# Patient Record
Sex: Male | Born: 1940 | Race: White | Hispanic: No | Marital: Married | Smoking: Never smoker
Health system: Southern US, Community
[De-identification: ages and names within clinical notes are randomized; demographics above are authoritative.]

## PROBLEM LIST (undated history)

## (undated) DIAGNOSIS — E669 Obesity, unspecified: Secondary | ICD-10-CM

## (undated) DIAGNOSIS — M545 Low back pain, unspecified: Secondary | ICD-10-CM

## (undated) DIAGNOSIS — K859 Acute pancreatitis without necrosis or infection, unspecified: Secondary | ICD-10-CM

## (undated) DIAGNOSIS — G8929 Other chronic pain: Secondary | ICD-10-CM

## (undated) DIAGNOSIS — E785 Hyperlipidemia, unspecified: Secondary | ICD-10-CM

## (undated) DIAGNOSIS — M109 Gout, unspecified: Secondary | ICD-10-CM

## (undated) DIAGNOSIS — M75100 Unspecified rotator cuff tear or rupture of unspecified shoulder, not specified as traumatic: Secondary | ICD-10-CM

## (undated) DIAGNOSIS — G4733 Obstructive sleep apnea (adult) (pediatric): Secondary | ICD-10-CM

## (undated) DIAGNOSIS — G20A1 Parkinson's disease without dyskinesia, without mention of fluctuations: Secondary | ICD-10-CM

## (undated) DIAGNOSIS — I10 Essential (primary) hypertension: Secondary | ICD-10-CM

## (undated) DIAGNOSIS — E119 Type 2 diabetes mellitus without complications: Secondary | ICD-10-CM

## (undated) DIAGNOSIS — M1990 Unspecified osteoarthritis, unspecified site: Secondary | ICD-10-CM

## (undated) DIAGNOSIS — H409 Unspecified glaucoma: Secondary | ICD-10-CM

## (undated) DIAGNOSIS — G20 Parkinson's disease: Secondary | ICD-10-CM

## (undated) HISTORY — DX: Low back pain: M54.5

## (undated) HISTORY — DX: Unspecified osteoarthritis, unspecified site: M19.90

## (undated) HISTORY — DX: Acute pancreatitis without necrosis or infection, unspecified: K85.90

## (undated) HISTORY — DX: Obstructive sleep apnea (adult) (pediatric): G47.33

## (undated) HISTORY — PX: OTHER SURGICAL HISTORY: SHX169

## (undated) HISTORY — PX: TONSILLECTOMY: SUR1361

## (undated) HISTORY — DX: Hyperlipidemia, unspecified: E78.5

## (undated) HISTORY — DX: Unspecified rotator cuff tear or rupture of unspecified shoulder, not specified as traumatic: M75.100

## (undated) HISTORY — DX: Other chronic pain: G89.29

## (undated) HISTORY — PX: HERNIA REPAIR: SHX51

## (undated) HISTORY — DX: Gout, unspecified: M10.9

## (undated) HISTORY — PX: APPENDECTOMY: SHX54

## (undated) HISTORY — DX: Obesity, unspecified: E66.9

## (undated) HISTORY — DX: Unspecified glaucoma: H40.9

## (undated) HISTORY — DX: Low back pain, unspecified: M54.50

---

## 2002-02-03 ENCOUNTER — Encounter: Payer: Self-pay | Admitting: Emergency Medicine

## 2002-02-03 ENCOUNTER — Emergency Department (HOSPITAL_COMMUNITY): Admission: EM | Admit: 2002-02-03 | Discharge: 2002-02-04 | Payer: Self-pay | Admitting: Emergency Medicine

## 2002-09-16 ENCOUNTER — Encounter: Payer: Self-pay | Admitting: Family Medicine

## 2002-09-16 ENCOUNTER — Observation Stay (HOSPITAL_COMMUNITY): Admission: AD | Admit: 2002-09-16 | Discharge: 2002-09-17 | Payer: Self-pay | Admitting: Cardiovascular Disease

## 2002-09-16 ENCOUNTER — Ambulatory Visit (HOSPITAL_COMMUNITY): Admission: RE | Admit: 2002-09-16 | Discharge: 2002-09-16 | Payer: Self-pay | Admitting: Family Medicine

## 2004-02-29 ENCOUNTER — Emergency Department (HOSPITAL_COMMUNITY): Admission: EM | Admit: 2004-02-29 | Discharge: 2004-02-29 | Payer: Self-pay | Admitting: Emergency Medicine

## 2004-07-11 ENCOUNTER — Observation Stay (HOSPITAL_COMMUNITY): Admission: EM | Admit: 2004-07-11 | Discharge: 2004-07-12 | Payer: Self-pay | Admitting: Emergency Medicine

## 2004-11-12 ENCOUNTER — Emergency Department (HOSPITAL_COMMUNITY): Admission: EM | Admit: 2004-11-12 | Discharge: 2004-11-12 | Payer: Self-pay | Admitting: Emergency Medicine

## 2004-11-21 ENCOUNTER — Encounter (INDEPENDENT_AMBULATORY_CARE_PROVIDER_SITE_OTHER): Payer: Self-pay | Admitting: General Surgery

## 2004-11-21 ENCOUNTER — Observation Stay (HOSPITAL_COMMUNITY): Admission: RE | Admit: 2004-11-21 | Discharge: 2004-11-22 | Payer: Self-pay | Admitting: General Surgery

## 2004-11-22 ENCOUNTER — Observation Stay (HOSPITAL_COMMUNITY): Admission: EM | Admit: 2004-11-22 | Discharge: 2004-11-24 | Payer: Self-pay | Admitting: Emergency Medicine

## 2005-03-06 ENCOUNTER — Ambulatory Visit (HOSPITAL_COMMUNITY): Admission: RE | Admit: 2005-03-06 | Discharge: 2005-03-06 | Payer: Self-pay | Admitting: Family Medicine

## 2005-08-15 ENCOUNTER — Ambulatory Visit (HOSPITAL_COMMUNITY): Admission: RE | Admit: 2005-08-15 | Discharge: 2005-08-15 | Payer: Self-pay | Admitting: Family Medicine

## 2005-09-08 ENCOUNTER — Ambulatory Visit (HOSPITAL_COMMUNITY): Admission: RE | Admit: 2005-09-08 | Discharge: 2005-09-08 | Payer: Self-pay | Admitting: Internal Medicine

## 2005-10-05 ENCOUNTER — Ambulatory Visit (HOSPITAL_COMMUNITY): Admission: RE | Admit: 2005-10-05 | Discharge: 2005-10-05 | Payer: Self-pay | Admitting: Family Medicine

## 2006-04-28 ENCOUNTER — Encounter: Admission: RE | Admit: 2006-04-28 | Discharge: 2006-04-28 | Payer: Self-pay | Admitting: Orthopedic Surgery

## 2006-06-17 ENCOUNTER — Emergency Department (HOSPITAL_COMMUNITY): Admission: EM | Admit: 2006-06-17 | Discharge: 2006-06-17 | Payer: Self-pay | Admitting: Emergency Medicine

## 2006-10-05 ENCOUNTER — Emergency Department (HOSPITAL_COMMUNITY): Admission: EM | Admit: 2006-10-05 | Discharge: 2006-10-05 | Payer: Self-pay | Admitting: *Deleted

## 2008-03-13 ENCOUNTER — Ambulatory Visit (HOSPITAL_COMMUNITY): Admission: RE | Admit: 2008-03-13 | Discharge: 2008-03-13 | Payer: Self-pay | Admitting: Family Medicine

## 2008-05-03 ENCOUNTER — Emergency Department (HOSPITAL_COMMUNITY): Admission: EM | Admit: 2008-05-03 | Discharge: 2008-05-03 | Payer: Self-pay | Admitting: Emergency Medicine

## 2009-07-04 ENCOUNTER — Emergency Department (HOSPITAL_COMMUNITY): Admission: EM | Admit: 2009-07-04 | Discharge: 2009-07-05 | Payer: Self-pay | Admitting: Emergency Medicine

## 2009-07-20 ENCOUNTER — Encounter (HOSPITAL_COMMUNITY): Admission: RE | Admit: 2009-07-20 | Discharge: 2009-08-19 | Payer: Self-pay | Admitting: Neurology

## 2009-10-12 ENCOUNTER — Ambulatory Visit (HOSPITAL_COMMUNITY)
Admission: RE | Admit: 2009-10-12 | Discharge: 2009-10-12 | Payer: Self-pay | Source: Home / Self Care | Admitting: Family Medicine

## 2009-10-14 ENCOUNTER — Emergency Department (HOSPITAL_COMMUNITY): Admission: EM | Admit: 2009-10-14 | Discharge: 2009-10-15 | Payer: Self-pay | Admitting: Emergency Medicine

## 2010-05-05 LAB — DIFFERENTIAL
Basophils Relative: 0 % (ref 0–1)
Eosinophils Relative: 2 % (ref 0–5)
Lymphocytes Relative: 24 % (ref 12–46)
Lymphs Abs: 1.7 10*3/uL (ref 0.7–4.0)
Monocytes Absolute: 0.7 10*3/uL (ref 0.1–1.0)
Monocytes Relative: 10 % (ref 3–12)

## 2010-05-05 LAB — URINALYSIS, ROUTINE W REFLEX MICROSCOPIC
Bilirubin Urine: NEGATIVE
Glucose, UA: NEGATIVE mg/dL
Nitrite: NEGATIVE
Specific Gravity, Urine: 1.025 (ref 1.005–1.030)
Urobilinogen, UA: 0.2 mg/dL (ref 0.0–1.0)
pH: 6.5 (ref 5.0–8.0)

## 2010-05-05 LAB — COMPREHENSIVE METABOLIC PANEL
AST: 22 U/L (ref 0–37)
CO2: 26 mEq/L (ref 19–32)
Creatinine, Ser: 0.92 mg/dL (ref 0.4–1.5)
GFR calc Af Amer: >60 mL/min (ref >60)
GFR calc non Af Amer: >60 mL/min (ref >60)

## 2010-05-05 LAB — CBC
MCV: 86.8 fL (ref 78.0–100.0)
Platelets: 215 10*3/uL (ref 150–400)
RDW: 12.6 % (ref 11.5–15.5)

## 2010-05-09 LAB — CBC
Hemoglobin: 14.8 g/dL (ref 13.0–17.0)
MCHC: 35.5 g/dL (ref 30.0–36.0)
WBC: 7.2 10*3/uL (ref 4.0–10.5)

## 2010-05-09 LAB — HEPATIC FUNCTION PANEL
Alkaline Phosphatase: 69 U/L (ref 39–117)
Indirect Bilirubin: 0.4 mg/dL (ref 0.3–0.9)
Total Protein: 7 g/dL (ref 6.0–8.3)

## 2010-05-09 LAB — BRAIN NATRIURETIC PEPTIDE: Pro B Natriuretic peptide (BNP): <30 pg/mL (ref 0.0–100.0)

## 2010-05-09 LAB — POCT CARDIAC MARKERS
CKMB, poc: 3.2 ng/mL (ref 1.0–8.0)
Troponin i, poc: <0.05 ng/mL (ref 0.00–0.09)

## 2010-05-09 LAB — BASIC METABOLIC PANEL
BUN: 16 mg/dL (ref 6–23)
Calcium: 9.3 mg/dL (ref 8.4–10.5)
Chloride: 105 mEq/L (ref 96–112)
Creatinine, Ser: 0.87 mg/dL (ref 0.4–1.5)
GFR calc non Af Amer: >60 mL/min (ref >60)
Glucose, Bld: 142 mg/dL — ABNORMAL HIGH (ref 70–99)

## 2010-05-09 LAB — DIFFERENTIAL
Basophils Relative: 0 % (ref 0–1)
Eosinophils Absolute: 0.2 10*3/uL (ref 0.0–0.7)
Lymphocytes Relative: 33 % (ref 12–46)
Lymphs Abs: 2.3 10*3/uL (ref 0.7–4.0)
Monocytes Absolute: 0.8 10*3/uL (ref 0.1–1.0)
Neutro Abs: 3.8 10*3/uL (ref 1.7–7.7)
Neutrophils Relative %: 53 % (ref 43–77)

## 2010-05-09 LAB — LIPASE, BLOOD: Lipase: 36 U/L (ref 11–59)

## 2010-06-02 LAB — CBC
MCV: 86.7 fL (ref 78.0–100.0)
Platelets: 223 10*3/uL (ref 150–400)
RBC: 5.03 MIL/uL (ref 4.22–5.81)
WBC: 5.8 10*3/uL (ref 4.0–10.5)

## 2010-06-02 LAB — DIFFERENTIAL
Lymphocytes Relative: 29 % (ref 12–46)
Lymphs Abs: 1.7 10*3/uL (ref 0.7–4.0)
Monocytes Relative: 11 % (ref 3–12)
Neutro Abs: 3.3 10*3/uL (ref 1.7–7.7)
Neutrophils Relative %: 57 % (ref 43–77)

## 2010-06-02 LAB — POCT CARDIAC MARKERS
CKMB, poc: 3.4 ng/mL (ref 1.0–8.0)
Troponin i, poc: <0.05 ng/mL (ref 0.00–0.09)

## 2010-06-02 LAB — BASIC METABOLIC PANEL
BUN: 18 mg/dL (ref 6–23)
Calcium: 9 mg/dL (ref 8.4–10.5)
Chloride: 102 mEq/L (ref 96–112)
Creatinine, Ser: 0.92 mg/dL (ref 0.4–1.5)
GFR calc Af Amer: >60 mL/min (ref >60)
GFR calc non Af Amer: >60 mL/min (ref >60)

## 2010-07-08 NOTE — Op Note (Signed)
NAMEFERNIE, Elijah [name]               ACCOUNT NO.:  [id number]   MEDICAL RECORD NO.:  [id number]          PATIENT TYPE:  AMB   LOCATION:  DAY                           FACILITY:  [facility]   PHYSICIAN:  Dalia Heading, M.D.  DATE OF BIRTH:  1940-06-26   DATE OF PROCEDURE:  11/21/2004  DATE OF DISCHARGE:                                 OPERATIVE REPORT   PREOPERATIVE DIAGNOSIS:  Recurrent incisional hernia.   POSTOPERATIVE DIAGNOSIS:  Recurrent incisional hernia.   PROCEDURE:  Recurrent incisional herniorrhaphy with allograft mesh.   SURGEON:  Dr. Franky Macho.   ANESTHESIA:  General endotracheal.   INDICATIONS:  The patient is a 70 year old white male who presents with a  recurrent incisional hernia. Previous incisional herniorrhaphy with  polypropylene mesh patch in 2000. He now presents with recurrent incisional  hernia. Risks and benefits of the procedure including bleeding, infection,  and recurrence of the hernia were fully explained to the patient, who gave  informed consent.   PROCEDURE NOTE:  The patient was placed in supine position. After induction  of general endotracheal anesthesia, the abdomen was prepped and draped using  the usual sterile technique with Betadine. Surgical site confirmation was  performed.   A midline excision was made from the subxiphoid process to the umbilicus.  This was taken through the previous surgical scar. The dissection was taken  down to the hernia. The patient was noted to have multiple subcutaneous  hernias, the largest of which was along the right side extending to the  right flank. The bowel was reduced into the abdominal cavity in an orderly  fashion. The hernia sac was entered into and was excised to the fascial  edges. The fascial edges were identified. It appeared that the mesh had  freed away from the right side of the abdominal wall. Once all the adhesions  were freed away from the hernia sac and the hernia sac was excised, a 4 x  12  cm piece of allograft was secured to the fascial edges using #1 Prolene  running suture. Number one Prolene stay sutures were also placed throughout  the repair. A #10 flat Jackson-Pratt drain was placed in the subcutaneous  tissue where the largest hernia sac was found. The subcutaneous layer was  reapproximated using 2-0 Vicryl interrupted sutures. The skin was closed  using staples. Betadine ointment and dry sterile dressings were applied.   All tape and needle counts were correct at the end of the procedure. The  patient was extubated in the operating room and went back to recovery room  awake in stable condition.   COMPLICATIONS:  None.   SPECIMEN:  Hernia sacs.   BLOOD LOSS:  Less than 100 cc.      Dalia Heading, M.D.  Electronically Signed     MAJ/MEDQ  D:  11/21/2004  T:  11/21/2004  Job:  [id number]   cc:   Angus G. Renard Matter, MD  Fax: [phone]

## 2010-07-08 NOTE — Group Therapy Note (Signed)
   Elijah [name], Elijah [name]                         ACCOUNT NO.:  [id number]   MEDICAL RECORD NO.:  [id number]                   PATIENT TYPE:  INP   LOCATION:  IC04                                 FACILITY:  [facility]   PHYSICIAN:  Angus G. Renard Matter, M.D.              DATE OF BIRTH:  18-Mar-1940   DATE OF PROCEDURE:  09/17/2002  DATE OF DISCHARGE:                                   PROGRESS NOTE   SUBJECTIVE:  This patient was admitted to the hospital with chest pain.  He  was placed on nitroglycerin drip.  He has remained stable with the exception  of the pulse rate remains in the 40s, has no further chest pain.   OBJECTIVE:  Vital signs:  Blood pressure 115/59, respirations 29, pulse 62,  temperature 98.4.  Lungs:  Clear to P&A.  Heart:  Regular rhythm.  Abdomen:  No palpable organs or masses.  His cardiac enzymes showed CPK 382, CPK-MB  6.3, relative index 1.6, troponin 0.01.   ASSESSMENT:  The patient was admitted with left anterior chest pain,  dyspnea.  He does have known metabolic syndrome, dyslipidemia.   PLAN:  Continue current regimen.                                               Angus G. Renard Matter, M.D.    AGM/MEDQ  D:  09/17/2002  T:  09/17/2002  Job:  [id number]

## 2010-07-08 NOTE — Consult Note (Signed)
Elijah [name], Elijah [name]                         ACCOUNT NO.:  [id number]   MEDICAL RECORD NO.:  [id number]                   PATIENT TYPE:   LOCATION:  ICU 4                                FACILITY:  [facility]   PHYSICIAN:  Richard A. Alanda Amass, M.D.          DATE OF BIRTH:  [birth year]   DATE OF CONSULTATION:  DATE OF DISCHARGE:                                   CONSULTATION   The patient is a 70 year old white, married, father of four with four  grandchildren.  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  He has a history of hyperlipidemia,  exogenous obesity, metabolic syndrome and statin intolerance.  He was  referred for evaluation of chest discomfort and CK elevation with negative  MBs and troponin.  Elijah [name] has coronary risk factors of exogenous obesity,  AODM diet controlled, hyperlipidemia and a family history of coronary  disease.   FAMILY HISTORY:  His mother had heart problems and multiple prior  interventions and is a patient of mine.  She is living at age 55.  Father  died at age 54.  He had a prior carotid endarterectomy.  He has no siblings.  He has four children, ages 73-36, who are alive and well.  His daughter is  pregnant now and he is expecting his five grandchild.   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  He also did some moving of furniture,  although, he avoided heavy lifting last week when he helped move his  daughter.   REASON FOR CONSULTATION:  This morning he began having somewhat vague left  upper extremity and left upper chest discomfort.  There was no radiation to  the mid-back or to the substernal region.  There was some mild  lightheadedness, a feeling of weakness and shortness of breath but no  diaphoresis, nausea or vomiting.  He saw Dr. Renard Matter.  An EKG showed no  acute changes.  It showed a sinus rhythm with minor nonspecific ST  changes  compatible with early repolarization.   LABORATORY DATA:  CPK of 717, an MB of 10.8 with an index of 1.5 and a  troponin of 0.04.  The patient has a history of hyperlipidemia in the past  and was tried on Crestor over a year ago.  He developed some vague cramps,  but moreover, he had persistent elevated CK with normal LFTs and the Crestor  was discontinued.  His last CK, February 2004, was 290 but he had been off  Crestor for at least six months at that time.   PAST MEDICAL HISTORY:  There is no definite history of hypertension and he  takes no regular medications.   PAST SURGICAL HISTORY:  He has had remote exploratory laparotomy and  cholecystectomy by Dr. Lovell Sheehan several years ago and has a midline incision  and asymptomatic ventral hernia.   ALLERGIES:  He is allergic to PENICILLIN with no other allergies.   REVIEW OF SYSTEMS:  There has been no GI bleeding; although, there is a  prior history of GERD and possibly ulcer disease.  He has not had  Claudication and there is no known thyroid disease.   PHYSICAL EXAMINATION:  GENERAL:  He has moderate obesity, particularly  truncal.  VITAL SIGNS:  Weight 248, height 5 foot 10 inches.  Blood pressure 160-  170/85-90 bilaterally.  NECK:  There are no carotid bruits.  HEENT:  PERRLA.  EOMs are intact.  Thyroid is not enlarged.  CHEST:  Shows mildly diminished breath sounds but is clear to P&A (he is a  nonsmoker).  There are no wheezes, rubs or rhonchi.  PMI is on the left sixth ICS just in the MCL.  Felt there was a localized  heave.  There is an S4 that is audible but no palpable and there is a short  systolic murmur at the left sternal border radiating to the apex 2/6.  There  is some radiation to the base but not to the carotids.  The carotid upstroke  is good.  This sounds like aortic sclerosis or possibly mild aortic stenosis  rather than functional.  There is no S3, JVD or HJR reflux.  DP and PT  intact.  Femorals  intact without bruits.  ABDOMEN:  Liver and spleen not palpable.  Midline ventral hernia.  No  abdominal masses, tenderness or bruits.  No pathologic reflexes.   EKG shows a sinus rhythm with minor nonspecific ST changes compatible with  early repolarization, minor IVCD.  No significant changes from prior EKG  done earlier today.   ASSESSMENT AND PLAN:  The patient's history is compatible with ischemia and  he does have risk factors.  I believe the CPKs are probably of noncardiac  origin and may be musculoskeletal related to heavy exertion both at work  over the last several days and last week.  He did have some elevated CPKs in  the past that were attributed to Crestor; but, he may have chronic CPK  elevation that are unrelated to that.  I would not expect to see CPKs  elevated a year later related to statin therapy.   In any event, with his risk factors and current history and laboratory  values, I think we should get serial tracings, start him on medical therapy.  If his pain is resolved and he has no enzymes markers to suggest ongoing  ischemia, I think he could have outpatient Cardiolite; if not, I would  recommend cardiac catheterization evaluation.  The patient and his wife are  agreeable with this and have discussed this with Dr. Renard Matter.  He is  admitted to Dr. Renard Matter' service at _________  and we will see him in  followup to help make further decisions.  The patient is going to telemetry  at [facility].                                                Richard A. Alanda Amass, M.D.    RAW/MEDQ  D:  09/16/2002  T:  09/16/2002  Job:  [id number]   cc:   Angus G. Renard Matter, M.D.  [address]  [place]  [place] [address]  Fax: [phone]

## 2010-07-08 NOTE — Discharge Summary (Signed)
NAMEMACY, LINGENFELTER               ACCOUNT NO.:  [id number]   MEDICAL RECORD NO.:  [id number]          PATIENT TYPE:  INP   LOCATION:  A303                          FACILITY:  [facility]   PHYSICIAN:  Dalia Heading, M.D.  DATE OF BIRTH:  [birth year]   DATE OF ADMISSION:  11/22/2004  DATE OF DISCHARGE:  10/05/2006LH                                 DISCHARGE SUMMARY   HOSPITAL COURSE:  The patient is a 70 year old, white male who underwent a  recurrent incisional herniorrhaphy with Allograft mesh on November 21, 2004.  He tolerated the procedure well and was discharged home on November 22, 2004.  He returned to the emergency room complaining of a choking sensation and  shortness of breath.  His oxygen saturation was 99%.  It appears that the  patient has a previous history of sinusitis and felt he could not clear his  throat due to drainage and incisional pain.  Anesthesia came by and saw the  patient and felt that there was no complication from anesthesia.  He was  started on Claritin D.  He improved during his stay.  He is being discharged  home on November 24, 2004, in good and improving condition.   FOLLOW UP:  The patient is to follow up with Dr. Franky Macho on November 29, 2004.  He is to drain and record his bulb suction twice a day.   DISCHARGE MEDICATIONS:  1.  Claritin D 12-hour 1 tablet p.o. b.i.d.  2.  He is to resume al his other medications as previously prescribed.   DISCHARGE DIAGNOSES:  1.  Shortness of breath, sinusitis.  2.  Status post recurrent incisional herniorrhaphy with Allograft mesh.  3.  Non-insulin-dependent diabetes mellitus.  4.  Hypertension.   PROCEDURES:  None.      Dalia Heading, M.D.  Electronically Signed     MAJ/MEDQ  D:  11/24/2004  T:  11/24/2004  Job:  [id number]

## 2010-07-08 NOTE — Cardiovascular Report (Signed)
NAMEKENDRELL, LOTTMAN NO.:  [id number]   MEDICAL RECORD NO.:  [id number]          PATIENT TYPE:  INP   LOCATION:  3707                         FACILITY:  [facility]   PHYSICIAN:  Nicki Guadalajara, M.D.     DATE OF BIRTH:  August 01, 1940   DATE OF PROCEDURE:  07/12/2004  DATE OF DISCHARGE:                              CARDIAC CATHETERIZATION   INDICATIONS:  Mr. Izaya Netherton is a 70 year old white male patient of Drs.  McIn[place] and [place]. He had previously undergone cardiac catheterization  several years ago which revealed mild coronary artery disease for which he  was treated medically. He had been doing fairly well, but he was admitted to  [facility] yesterday with episodes of chest pain several times  during the past week radiating to his throat. Troponins were negative x 2.  CPK was increased at 379 with an MB of 11.8. Definitive diagnostic cardiac  catheterization was recommended.   PROCEDURE:  After premedication with Valium 5 mg intravenously, the patient  was prepped and draped in the usual fashion. His right femoral artery was  punctured anteriorly and a 5-French arterial sheath was inserted. Diagnostic  catheterization was done with a 5-French Judkins for left and right coronary  catheters. Two hundred micrograms of intracoronary nitroglycerin  administered to the right femoral artery due to potential coronary spasm  proximally. A 5-French pigtail catheter was then inserted for biplane cine  left ventriculography. With the patient's hypertensive history, distal  aortography was also performed to make certain there was no significant  renal artery stenosis. Hemostasis was obtained by direct manual pressure.  The patient tolerated the procedure well.   Hemodynamic data:  Central aortic pressure was 120/72.  Left ventricular  pressure was 120/70.   Angiographic data:  Left main coronary artery was a large caliber vessel  that had 20% eccentric inferior  smooth narrowing. The left main trifurcated  into an LAD, the ramus intermediate vessel, as well as a tortuous proximal  left circumflex vessel.   The LAD was angiographically normal and gave rise to two prominent proximal  diagonal vessels and several septal perforating arteries.   The optional diagonal/ramus intermediate vessel had mild 20% narrowing  __________  the very proximal circumflex had smooth 20% narrowing on a sharp  bend in the vessel and gave rise to one marginal vessel.   The right coronary was a very large dominant vessel that had a second  shepherd's crook takeoff. There was a mild 30% narrowing in the most  proximal segment at the distal aspect of the catheter.  Two hundred  micrograms of intracoronary nitroglycerin was administered with some  improvement suggesting component of catheter-induced spasm. There was smooth  20-30% narrowing within the shepherd's crook bend. There was mild luminal  irregularity of the large right coronary artery without significant  obstruction. There was mild smooth 20% narrowing in the proximal portion of  the large PDA system. The right coronary artery entered a large PLA system  which extended to the LV apex.   Biplane cine left ventriculography revealed normal LV contractility without  focal segmental wall motion abnormalities. There was no mitral  regurgitation.   Distal aortography did not reveal any significant aortoiliac disease.  The  iliac systems bilaterally were fairly tortuous.   IMPRESSION:  1.  Normal left ventricular function.  2.  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  Very mild, not significant obstructive coronary artery disease with      smooth narrowing of 20% in the proximal portion of the left main       coronary artery, 20% narrowing in the proximal portion of a ramus      intermediate branch; 20% narrowing in the proximal portion of the left      circumflex coronary artery, and 20-30% proximal narrowing in the right      coronary artery in the region of the shepherd's crook with 30% narrowing      more proximally which may have had a component of a catheter-induced      spasm.   RECOMMENDATIONS:  Medical therapy.      TK/MEDQ  D:  07/12/2004  T:  07/12/2004  Job:  [id number]   cc:   Gerlene Burdock A. Alanda Amass, M.D.  [phone] [address]  [place]  [place] [address]  Fax: [phone]   Angus G. Renard Matter, MD  [address]  [place]  [place] [address]  Fax: [phone]

## 2010-07-08 NOTE — H&P (Signed)
NAMEDRYSTAN, READER               ACCOUNT NO.:  [id number]   MEDICAL RECORD NO.:  [id number]           PATIENT TYPE:  AMB   LOCATION:                                FACILITY:  [facility]   PHYSICIAN:  Dalia Heading, M.D.  DATE OF BIRTH:  1940-03-06   DATE OF ADMISSION:  DATE OF DISCHARGE:  LH                                HISTORY & PHYSICAL   CHIEF COMPLAINT:  Recurrent incisional hernia.   HISTORY OF PRESENT ILLNESS:  The patient is a 70 year old white male who is  referred for evaluation and treatment of a recurrent incisional hernia. It  has been present for some time, but recently has increased in size and is  causing him discomfort. He last had an incisional herniorrhaphy with mesh in  2000.  No nausea or vomiting has been noted.  He was constipated recently,  but this has since resolved.   PAST MEDICAL HISTORY:  Hypertension and reflux disease.   PAST SURGICAL HISTORY:  As noted above, exploratory laparotomy secondary to  a perforated appendix in 1998.   CURRENT MEDICATIONS:  1.  Toprol 12.5 mg p.o. daily.  2.  Advicor 500 -- 20 mg p.o. daily.  3.  Diovan 80/12.5 mg p.o. daily.  4.  Protonix 40 mg p.o. daily.  5.  Baby aspirin 1 tablet p.o. daily.   ALLERGIES:  No known drug allergies.   REVIEW OF SYSTEMS:  The patient denies drinking or smoking.  Denies any  recent chest pain, MI, cerebrovascular accident, diabetes mellitus, or  bleeding disorders.   PHYSICAL EXAMINATION:  GENERAL:  On physical examination, the patient is an  obese white male in no acute distress.  LUNGS:  Clear to auscultation with equal breath sounds bilaterally.  HEART:  Heart examination reveals a regular rate and rhythm without S3, S4,  or murmurs.  ABDOMEN:  The abdomen is soft, nontender, nondistended.  No  hepatosplenomegaly or masses noted.  A large mid incisional hernia is noted  extended to the right lateral side.   IMPRESSION:  Recurrent incisional hernia.   PLAN:  The patient is  scheduled for a recurrent incisional herniorrhaphy  with Alloderm on November 21, 2004.  The risks and benefits of the procedure  including bleeding, infection, and the possibility of recurrence of the  hernia were fully explained to the patient, who gave informed consent.      Dalia Heading, M.D.  Electronically Signed     MAJ/MEDQ  D:  11/15/2004  T:  11/15/2004  Job:  [id number]   cc:   Angus G. Renard Matter, MD  Fax: [phone]

## 2010-07-12 NOTE — H&P (Signed)
Elijah [name], Elijah [name]                         ACCOUNT NO.:  [id number]   MEDICAL RECORD NO.:  [id number]                   PATIENT TYPE:   LOCATION:  ICU 4                                FACILITY:  [facility]   PHYSICIAN:  Angus G. Renard Matter, M.D.              DATE OF BIRTH:  [birth year]   DATE OF ADMISSION:  09/16/2002  DATE OF DISCHARGE:                                HISTORY & PHYSICAL   HISTORY OF PRESENT ILLNESS:  This 70 year old white male was seen earlier in  the day with the chief complaint being left upper chest pain which occurred  intermittently through the night and associated pain in the left arm.  The  patient had had some nasal congestion and postnasal drainage.  In the course  of the workup, he had a chest x-ray which was negative.  Cardiac enzymes  were abnormal showing a CPK of 717, CPK-MB 10.8, relative index 1.5, and  troponin 0.04.  Electrocardiogram with no acute changes.  The patient was  seen subsequently by Richard A. Alanda Amass, M.D., of cardiology.  A repeat  EKG was done which showed no acute change, but it was felt that he should be  placed in an acute care facility for further evaluation of cardiac enzymes  and further monitoring to rule out ischemic heart disease.   FAMILY HISTORY:  Positive for diabetes and hypertensive cardiovascular  disease.   SOCIAL HISTORY:  The patient does not smoke or drink alcohol.   PAST MEDICAL HISTORY:  The patient had an admission to [facility] in 1988  with chest pain to rule out ischemic heart disease.  No disease found.  The  patient also has a history of elevated CK thought secondary to statins.  He  does have a history of dyslipidemia and metabolic syndrome.   PAST SURGICAL HISTORY:  The patient has had a history of incisional hernia  repair by Dr. Lovell Sheehan in December of 2000.  He has also had a prior  appendectomy with perforated appendix in 1998.   ALLERGIES:  The patient has an allergy to PENICILLIN.   REVIEW OF  SYSTEMS:  HEENT:  Negative.  CARDIOPULMONARY:  The patient has had  anterior chest pain.  No cough.  Minimal dyspnea.  GASTROINTESTINAL:  No  bowel irregularity or bleeding.  GENITOURINARY:  No dysuria or hematuria.   PHYSICAL EXAMINATION:  GENERAL APPEARANCE:  An alert white male.  VITAL SIGNS:  Blood pressure 140/80, pulse 60, respirations 18, temperature  98 degrees.  HEENT:  Eyes:  PERRLA.  TMs negative.  Oropharynx benign.  NECK:  Supple.  No JVD or thyroid abnormalities.  HEART:  Regular rhythm.  No murmurs.  No cardiomegaly.  LUNGS:  Clear to P&A.  ABDOMEN:  No palpable masses or organomegaly.  EXTREMITIES:  Free of edema.  NEUROLOGIC:  No focal deficit.    DIAGNOSES:  1. Left chest pain of undetermined etiology, rule out ischemic  heart     disease.  2. History of dyslipidemia.  3. Metabolic syndrome.                                               Angus G. Renard Matter, M.D.    AGM/MEDQ  D:  09/16/2002  T:  09/16/2002  Job:  [id number]

## 2010-07-12 NOTE — Discharge Summary (Signed)
NAMETANNAR, BROKER               ACCOUNT NO.:  [id number]   MEDICAL RECORD NO.:  [id number]          PATIENT TYPE:  INP   LOCATION:  3707                         FACILITY:  [facility]   PHYSICIAN:  Richard A. Alanda Amass, M.D.DATE OF BIRTH:  27-Feb-1940   DATE OF ADMISSION:  07/11/2004  DATE OF DISCHARGE:  07/12/2004                                 DISCHARGE SUMMARY   DISCHARGE DIAGNOSES:  1.  Chest pain with positive CK-MB but negative troponins, negative      myocardial infarction.  2.  Nonobstructive coronary artery disease.  3.  Coronary spasm or the right coronary artery.  4.  Hypertension.  5.  Right shoulder pain.  6.  Dyslipidemia.  7.  Diet-controlled diabetes mellitus.  8.  Glaucoma.   DISCHARGE CONDITION:  Improved.   PROCEDURES:  On Jul 12, 2004, left heart catheterization by Nicki Guadalajara,  M.D.   DISCHARGE MEDICATIONS:  1.  Diovan/HCT 160/25 mg daily.  2.  Advicor 500/20 mg one daily.  3.  Toprol XL 25 mg daily.  4.  Protonix 40 mg daily.   DISCHARGE INSTRUCTIONS:  1.  A low-fat, low-salt, diabetic diet.  2.  Increase activity slowly.  3.  No strenuous activity for three days, no lifting over 10 pounds.  4.  Wash catheter site with soap and water.  Call if any bleeding, swelling      or drainage.   HISTORY OF PRESENT ILLNESS:  A 70 year old white married male patient of Dr.  Alanda Amass with minimal coronary disease, came to the emergency room at [facility]  with complaints of chest pain.  He has had pressure in his chest twice in  the last week.  His chest pressure radiates up into his throat.  He has been  slightly short of breath but no diaphoresis, no nausea or vomiting.  He has  also had some right shoulder pain that is sharp.  Today the pain and  pressure in his chest was worse and he did not think it would subside.  It  lasted about an hour.  He came to the emergency room.   PAST MEDICAL HISTORY:  1.  Hypertension.  2.  Dyslipidemia with low HDL.  3.   Gastroesophageal reflux disease.  4.  Appendectomy.  5.  Cardiac catheterization in August 2004 with minimal coronary disease in      diagonal 2.  6.  Diet-controlled diabetes.   OUTPATIENT MEDICATIONS:  Same as discharge medications.   ALLERGIES:  PENICILLIN, NIASPAN he does not tolerate.   Family history, social history, review of systems:  See H&P.   PHYSICAL EXAMINATION AT DISCHARGE:  VITAL SIGNS:  Blood pressure 128/77,  pulse of 49, respirations 20, oxygen saturation 100% on room air.  CARDIAC:  Regular rate and rhythm.  CHEST:  Lungs are clear without rales.  ABDOMEN:  Soft, nontender.  Positive bowel sounds.  EXTREMITIES:  Lower extremities without edema, and 2+ pedal pulses.   LABORATORY DATA:  Sodium 139, potassium 3.9, chloride 105, CO2 24, glucose  162, BUN 19, creatinine 1.2, calcium 9.4, total protein 6.9, albumin 4, AST  29, ALT 29, alkaline phosphatase 66, total bilirubin 0.9.  Magnesium 2.1.  Troponin I was less than 0.01.  Hemoglobin 14.9, hematocrit 43, WBC 7 and  platelets 258.  TSH 1.743.  Glycohemoglobin 6.5.  CK-MB 313, MB 8.7,  relative index was minimally elevated at 2.8.  Follow-up CK 308, MB 8.6,  relative index 2.8.  The initial CK was 379, MB 11.8 and relative index 3.1.  All the troponins were less than 0.01.  Cholesterol panel was still pending  at discharge.  Chest x-ray on May 22:  Patchy left basilar opacity,  radiographically most compatible with atelectasis or scarring, no definite  acute findings.  EKG on admission:  Sinus rhythm, normal EKG.  Follow-up on  the May 23 was bradycardia, a rate of 49 was the only abnormality.   HOSPITAL COURSE:  Mr. Donath was admitted by Dr. Allyson Sabal on call for Dr.  Alanda Amass secondary to chest pain.  He was placed on IV heparin and planned  for cardiac catheterization the next day on May 23.  He was stable.  CK-MBs  were up, relative index was minimally elevated, but his troponins were  negative.  He underwent  cardiac  catheterization that revealed nonobstructive coronary artery disease with  20% stenosis in the vessels, perhaps some spasm in the RCA.  Dr. Tresa Endo felt  he was stable and discharged him on the afternoon of Jul 12, 2004.  The  patient will follow up with Dr. Alanda Amass.       LRI/MEDQ  D:  09/05/2004  T:  09/06/2004  Job:  [id number]   cc:   Angus G. Renard Matter, MD  [address]  [place]  [place] [address]  Fax: [phone]   Nicki Guadalajara, M.D.  [phone] [address], [address]  [place], [place] [address]  Fax: [phone]

## 2010-12-02 LAB — CBC
Hemoglobin: 14.8
RBC: 5.12
RDW: 12.5
WBC: 6.9

## 2010-12-02 LAB — DIFFERENTIAL
Basophils Relative: 1
Eosinophils Absolute: 0.2
Monocytes Absolute: 0.7
Monocytes Relative: 11
Neutrophils Relative %: 59

## 2010-12-02 LAB — COMPREHENSIVE METABOLIC PANEL
ALT: 24
Alkaline Phosphatase: 69
Glucose, Bld: 117 — ABNORMAL HIGH
Potassium: 4
Sodium: 139
Total Protein: 7.1

## 2012-07-01 ENCOUNTER — Encounter (HOSPITAL_COMMUNITY): Payer: Self-pay | Admitting: *Deleted

## 2012-07-01 ENCOUNTER — Emergency Department (HOSPITAL_COMMUNITY)
Admission: EM | Admit: 2012-07-01 | Discharge: 2012-07-02 | Disposition: A | Discharge status: Home / Self Care | Payer: Medicare Other | Attending: Emergency Medicine | Admitting: Emergency Medicine

## 2012-07-01 DIAGNOSIS — I10 Essential (primary) hypertension: Secondary | ICD-10-CM | POA: Insufficient documentation

## 2012-07-01 DIAGNOSIS — G20A1 Parkinson's disease without dyskinesia, without mention of fluctuations: Secondary | ICD-10-CM | POA: Insufficient documentation

## 2012-07-01 DIAGNOSIS — E119 Type 2 diabetes mellitus without complications: Secondary | ICD-10-CM | POA: Insufficient documentation

## 2012-07-01 DIAGNOSIS — R109 Unspecified abdominal pain: Secondary | ICD-10-CM | POA: Insufficient documentation

## 2012-07-01 DIAGNOSIS — K4090 Unilateral inguinal hernia, without obstruction or gangrene, not specified as recurrent: Secondary | ICD-10-CM

## 2012-07-01 DIAGNOSIS — M545 Low back pain, unspecified: Secondary | ICD-10-CM | POA: Insufficient documentation

## 2012-07-01 DIAGNOSIS — G20 Parkinson's disease: Secondary | ICD-10-CM | POA: Insufficient documentation

## 2012-07-01 DIAGNOSIS — M549 Dorsalgia, unspecified: Secondary | ICD-10-CM

## 2012-07-01 HISTORY — DX: Parkinson's disease without dyskinesia, without mention of fluctuations: G20.A1

## 2012-07-01 HISTORY — DX: Essential (primary) hypertension: I10

## 2012-07-01 HISTORY — DX: Parkinson's disease: G20

## 2012-07-01 HISTORY — DX: Type 2 diabetes mellitus without complications: E11.9

## 2012-07-01 NOTE — ED Notes (Signed)
C/o back pain, dry hacking cough and lower abdominal soreness

## 2012-07-02 ENCOUNTER — Emergency Department (HOSPITAL_COMMUNITY): Payer: Medicare Other

## 2012-07-02 MED ORDER — LOPERAMIDE HCL 2 MG PO CAPS
ORAL_CAPSULE | ORAL | Status: AC
  Filled 2012-07-02: qty 1

## 2012-07-02 MED ORDER — DIAZEPAM 5 MG PO TABS
5.0000 mg | ORAL_TABLET | Freq: Once | ORAL | Status: AC
  Administered 2012-07-02: 5 mg via ORAL
  Filled 2012-07-02: qty 1

## 2012-07-02 MED ORDER — CYCLOBENZAPRINE HCL 10 MG PO TABS: 10.0000 mg | ORAL_TABLET | Freq: Two times a day (BID) | ORAL | Status: DC | PRN

## 2012-07-02 MED ORDER — HYDROCODONE-ACETAMINOPHEN 5-325 MG PO TABS: 1.0000 | ORAL_TABLET | Freq: Four times a day (QID) | ORAL | Status: DC | PRN

## 2012-07-02 NOTE — ED Provider Notes (Signed)
History     CSN: [id number]  Arrival date & time 07/01/12  2139   First MD Initiated Contact with Patient 07/02/12 0005      Chief Complaint  Patient presents with  . Back Pain    (Consider location/radiation/quality/duration/timing/severity/associated sxs/prior treatment) HPI History provided by patient. Spent about 9 hours doing lawn work 2 days ago and woke up the following day with right-sided lower back pain radiates to right inguinal region. He's had this inguinal pain for some time with known history of hernia. No new bulging. No associated weakness or numbness. Pain feels like a "muscle spasm", is worse with any movement, twisting or bending. Patient has history of arthritis and written back pains but denies pain like this before. No hematuria. No fevers. No known trauma otherwise. Symptoms moderate to severe.  Past Medical History  Diagnosis Date  . Parkinson disease   . Diabetes mellitus without complication   . Hypertension     Past Surgical History  Procedure Laterality Date  . Appendectomy    . Hernia repair      No family history on file.  History  Substance Use Topics  . Smoking status: Never Smoker   . Smokeless tobacco: Not on file  . Alcohol Use: No      Review of Systems  Constitutional: Negative for fever and chills.  HENT: Negative for neck pain and neck stiffness.   Eyes: Negative for pain.  Respiratory: Negative for shortness of breath.   Cardiovascular: Negative for chest pain.  Gastrointestinal: Negative for constipation, blood in stool and abdominal distention.  Genitourinary: Negative for dysuria.  Musculoskeletal: Positive for back pain.  Skin: Negative for rash.  Neurological: Negative for headaches.  All other systems reviewed and are negative.    Allergies  Aspirin and Penicillins  Home Medications   Current Outpatient Rx  Name  Route  Sig  Dispense  Refill  . cyclobenzaprine (FLEXERIL) 10 MG tablet   Oral   Take 1 tablet  (10 mg total) by mouth 2 (two) times daily as needed for muscle spasms.   20 tablet   0   . HYDROcodone-acetaminophen (NORCO/VICODIN) 5-325 MG per tablet   Oral   Take 1 tablet by mouth every 6 (six) hours as needed for pain.   14 tablet   0     BP 155/78  Pulse 64  Temp(Src) 97.5 F (36.4 C) (Oral)  Resp 16  Ht 5\' 10"  (1.778 m)  Wt 230 lb (104.327 kg)  BMI 33 kg/m2  SpO2 100%  Physical Exam  Constitutional: He is oriented to person, place, and time. He appears well-developed and well-nourished.  HENT:  Head: Normocephalic and atraumatic.  Eyes: EOM are normal. Pupils are equal, round, and reactive to light.  Neck: Neck supple.  Cardiovascular: Normal rate, regular rhythm and intact distal pulses.   Pulmonary/Chest: Effort normal and breath sounds normal. No respiratory distress.  Abdominal: Soft. Bowel sounds are normal. He exhibits no distension and no mass. There is no tenderness. There is no rebound and no guarding.  Musculoskeletal: Normal range of motion. He exhibits no edema.  Localizes discomfort to right lower paralumbar region without reproducible tenderness. No midline tenderness or deformity. No inguinal mass.   Neurological: He is alert and oriented to person, place, and time.  Skin: Skin is warm and dry.    ED Course  Procedures (including critical care time)  Labs Reviewed - No data to display Ct Abdomen Pelvis Wo Contrast  07/02/2012  *  RADIOLOGY REPORT*  Clinical Data: Right flank pain  CT ABDOMEN AND PELVIS WITHOUT CONTRAST  Technique:  Multidetector CT imaging of the abdomen and pelvis was performed following the standard protocol without intravenous contrast.  Comparison: 10/14/2009  Findings: Mild lingular opacity, favor atelectasis or scarring.  Coronary artery calcification.  Organ abnormality/lesion detection is limited in the absence of intravenous contrast. Within this limitation, punctate hepatic calcifications favor granulomas infection.   Unremarkable biliary system, pancreas, adrenal glands. Mild splenomegaly at 13.5 cm cranial caudal.  Posterior upper/interpolar left renal cyst measures water density. Tiny nonobstructing left renal stone.  No hydronephrosis or hydroureter. No ureteral calculi are identified.  Colonic diverticulosis.  No overt colitis or diverticulitis. Appendix not identified.  No right lower quadrant inflammation. Small hiatal hernia.  Small bowel loops are normal course and caliber.  No free intraperitoneal air or fluid.  No lymphadenopathy.  There is scattered atherosclerotic calcification of the aorta and its branches. No aneurysmal dilatation.  Right inguinal hernia contains a portion of the bladder.  Fat containing left inguinal hernia.  Interval repair of the ventral abdominal wall hernia.  Advanced multilevel degenerative changes of the lumbar spine with multilevel severe central canal and neural foraminal narrowing.  No acute osseous finding.  IMPRESSION: No hydronephrosis or ureteral calculi identified.  Tiny nonobstructing left renal stone.  Mild splenomegaly is nonspecific.  Advanced multilevel degenerative changes of the lumbar spine as above.  Bilateral inguinal hernias, on the right contains the anterior right margin of the bladder.   Original Report Authenticated By: Jearld Lesch, M.D.      1. Back pain   2. Inguinal hernia     ice and valium provided  On recheck is feeling much better and requesting be discharged home. Low back pain precautions provided and verbalizes understood. Patient followed locally by general surgery and agrees to followup for the same for him persistent inguinal pain MDM  Low back pain improved with medications as above, evaluated with imaging reviewed as above. Vital signs and nursing notes reviewed and considered        Sunnie Nielsen, MD 07/02/12 [phone]

## 2012-08-19 ENCOUNTER — Ambulatory Visit: Payer: Self-pay | Admitting: Neurology

## 2012-10-01 ENCOUNTER — Encounter: Payer: Self-pay | Admitting: Neurology

## 2012-10-07 ENCOUNTER — Ambulatory Visit (INDEPENDENT_AMBULATORY_CARE_PROVIDER_SITE_OTHER): Payer: Medicare Other | Admitting: Neurology

## 2012-10-07 ENCOUNTER — Encounter: Payer: Self-pay | Admitting: Neurology

## 2012-10-07 VITALS — BP 140/81 | HR 72 | Wt 243.0 lb

## 2012-10-07 DIAGNOSIS — G20A1 Parkinson's disease without dyskinesia, without mention of fluctuations: Secondary | ICD-10-CM | POA: Insufficient documentation

## 2012-10-07 DIAGNOSIS — M109 Gout, unspecified: Secondary | ICD-10-CM

## 2012-10-07 DIAGNOSIS — G20 Parkinson's disease: Secondary | ICD-10-CM | POA: Insufficient documentation

## 2012-10-07 HISTORY — DX: Gout, unspecified: M10.9

## 2012-10-07 MED ORDER — PRAMIPEXOLE DIHYDROCHLORIDE 1 MG PO TABS: 1.0000 mg | ORAL_TABLET | Freq: Three times a day (TID) | ORAL | Status: DC

## 2012-10-07 MED ORDER — CARBIDOPA-LEVODOPA ER 50-200 MG PO TBCR: 1.0000 | EXTENDED_RELEASE_TABLET | Freq: Four times a day (QID) | ORAL | Status: DC

## 2012-10-07 NOTE — Progress Notes (Signed)
Reason for visit: Parkinson's disease  Elijah [name] is an 72 y.o. male  History of present illness:  Elijah [name] is a 72 year old left-handed white male with a history of Parkinson's disease. The patient has done fairly well since last seen, and he is maintaining a good level of activity. The patient has prominent tremors of the left upper extremity. The patient has not had any significant balance issues, or problems with swallowing. The patient is on Sinemet and Requip, and he is tolerating medications well. The patient denies any falls, or problems with swallowing. The patient does report some low back pain associated with arthritis. The patient has gone on Mobic for this. The patient also had problems with gout. The patient returns for an evaluation.  Past Medical History  Diagnosis Date  . Parkinson disease   . Diabetes mellitus without complication   . Hypertension   . Gout 10/07/2012  . Obesity   . Degenerative arthritis   . Low back pain   . Dyslipidemia   . Glaucoma   . Rotator cuff tear     Bilateral, no surgery    Past Surgical History  Procedure Laterality Date  . Appendectomy    . Hernia repair    . Tonsillectomy    . Shoulder bone spur Left     RESECTION    Family History  Problem Relation Age of Onset  . Stroke Mother   . Diabetes Mother   . Heart Problems Mother   . Parkinsonism Father   . Heart Problems Father     Social history:  reports that he has never smoked. He has never used smokeless tobacco. He reports that he does not drink alcohol or use illicit drugs.  Allergies:  Allergies  Allergen Reactions  . Aspirin   . Penicillins     Medications:  Current Outpatient Prescriptions on File Prior to Visit  Medication Sig Dispense Refill  . aspirin 81 MG tablet Take 81 mg by mouth daily.      . cyclobenzaprine (FLEXERIL) 10 MG tablet Take 1 tablet (10 mg total) by mouth 2 (two) times daily as needed for muscle spasms.  20 tablet  0  .  LORazepam (ATIVAN) 1 MG tablet Take 1 mg by mouth as needed for anxiety.      . metoprolol tartrate (LOPRESSOR) 25 MG tablet Take 25 mg by mouth daily. 1/2 TABLET DAILY      . valsartan-hydrochlorothiazide (DIOVAN-HCT) 80-12.5 MG per tablet Take 1 tablet by mouth daily.       No current facility-administered medications on file prior to visit.    ROS:  Out of a complete 14 system review of symptoms, the patient complains only of the following symptoms, and all other reviewed systems are negative.  Tremor Arthritis  Blood pressure 140/81, pulse 72, weight 243 lb (110.224 kg).  Physical Exam  General: The patient is alert and cooperative at the time of the examination. The patient is moderately obese.  Skin: No significant peripheral edema is noted.   Neurologic Exam  Cranial nerves: Facial symmetry is present. Speech is normal, no aphasia or dysarthria is noted. Extraocular movements are full. Visual fields are full. Mild masking of the face is noted.  Motor: The patient has good strength in all 4 extremities.  Coordination: The patient has good finger-nose-finger and heel-to-shin bilaterally. Prominent tremors are seen with the left upper extremity, with a resting tremor.  Gait and station: The patient has a normal gait. Tandem gait  is normal. Romberg is negative. No drift is seen. The patient is able to arise from a seated position with arms crossed. Once up, the patient has good stride with walking, but a prominent resting tremor with the left arm is seen.  Reflexes: Deep tendon reflexes are symmetric.   Assessment/Plan:  One. Parkinson's disease  2. Left upper extremity tremor  The patient will be maintained on his current dose of the Sinemet taking the 50/200 mg CR tablets 4 times daily, and he will be maintained on the Mirapex at 1 mg 3 times daily. This can be increased in the future. The patient is remaining quite active, and I have encouraged him to continue this.  The patient will followup through this office in 6 months.  Marlan Palau MD 10/07/2012 7:57 PM  [facility] [address] [place], [place] [address]  Phone [phone] Fax [phone]

## 2012-11-05 ENCOUNTER — Other Ambulatory Visit: Payer: Self-pay | Admitting: Neurology

## 2013-02-28 ENCOUNTER — Ambulatory Visit: Payer: Self-pay | Admitting: Neurology

## 2013-04-25 ENCOUNTER — Encounter (INDEPENDENT_AMBULATORY_CARE_PROVIDER_SITE_OTHER): Payer: Self-pay

## 2013-04-25 ENCOUNTER — Ambulatory Visit (INDEPENDENT_AMBULATORY_CARE_PROVIDER_SITE_OTHER): Payer: Medicare Other | Admitting: Neurology

## 2013-04-25 ENCOUNTER — Encounter: Payer: Self-pay | Admitting: Neurology

## 2013-04-25 VITALS — BP 138/71 | HR 77 | Wt 230.0 lb

## 2013-04-25 DIAGNOSIS — G20 Parkinson's disease: Secondary | ICD-10-CM

## 2013-04-25 MED ORDER — PRAMIPEXOLE DIHYDROCHLORIDE 1.5 MG PO TABS: 1.5000 mg | ORAL_TABLET | Freq: Three times a day (TID) | ORAL | Status: DC

## 2013-04-25 NOTE — Progress Notes (Signed)
Reason for visit: Parkinson's disease  Elijah [name] is an 73 y.o. male  History of present illness:  Elijah [name] is a 73 year old left-handed white male with a history of Parkinson's disease. The patient has a prominent resting tremor on the left arm. The patient indicates that at nighttime when he tries to sleep, the tremor is quite prominent and may actually lead to neuromuscular discomfort in the left shoulder. The patient has difficulty getting to sleep because of the tremor. Otherwise, the patient is staying active, and he is doing well with his walking. The patient has not had any falls. The patient denies any confusion or hallucinations on the medication, and he denies any issues with chewing or swallowing. The patient takes Flexeril on occasion for neuromuscular discomfort. The patient is not sure, but he believes that the tremor does seem to be better if he takes the Flexeril. Benadryl results in nervousness and jitteriness. The patient returns to this office for an evaluation. No other significant medical issues have come up since last seen. The patient does report some increasing problems with getting up from a seated position over the last 6 months.  Past Medical History  Diagnosis Date  . Parkinson disease   . Diabetes mellitus without complication   . Hypertension   . Gout 10/07/2012  . Obesity   . Degenerative arthritis   . Low back pain   . Dyslipidemia   . Glaucoma   . Rotator cuff tear     Bilateral, no surgery    Past Surgical History  Procedure Laterality Date  . Appendectomy    . Hernia repair    . Tonsillectomy    . Shoulder bone spur Left     RESECTION    Family History  Problem Relation Age of Onset  . Stroke Mother   . Diabetes Mother   . Heart Problems Mother   . Parkinsonism Father   . Heart Problems Father     Social history:  reports that he has never smoked. He has never used smokeless tobacco. He reports that he does not drink alcohol or  use illicit drugs.    Allergies  Allergen Reactions  . Aspirin   . Penicillins     Medications:  Current Outpatient Prescriptions on File Prior to Visit  Medication Sig Dispense Refill  . allopurinol (ZYLOPRIM) 100 MG tablet Take 100 mg by mouth daily.       Marland Kitchen aspirin 81 MG tablet Take 81 mg by mouth daily.      . Azelastine HCl 0.15 % SOLN Place 1 spray into the nose 2 (two) times daily as needed.       . carbidopa-levodopa (SINEMET CR) 50-200 MG per tablet Take 1 tablet by mouth 4 (four) times daily.  120 tablet  11  . cyclobenzaprine (FLEXERIL) 10 MG tablet Take 1 tablet (10 mg total) by mouth 2 (two) times daily as needed for muscle spasms.  20 tablet  0  . LORazepam (ATIVAN) 1 MG tablet Take 1 mg by mouth as needed for anxiety.      . meloxicam (MOBIC) 15 MG tablet Take 15 mg by mouth daily as needed.       . metFORMIN (GLUCOPHAGE) 500 MG tablet Take 500 mg by mouth 2 (two) times daily with a meal.       . metoprolol tartrate (LOPRESSOR) 25 MG tablet Take 25 mg by mouth daily. 1/2 TABLET DAILY      . NEXIUM 40  MG capsule Take 40 mg by mouth daily before breakfast.       . simvastatin (ZOCOR) 40 MG tablet Take 40 mg by mouth at bedtime.       . valsartan-hydrochlorothiazide (DIOVAN-HCT) 80-12.5 MG per tablet Take 1 tablet by mouth daily.      . niacin 500 MG CR capsule Take 500 mg by mouth at bedtime.       No current facility-administered medications on file prior to visit.    ROS:  Out of a complete 14 system review of symptoms, the patient complains only of the following symptoms, and all other reviewed systems are negative.  Tremor  Blood pressure 138/71, pulse 77, weight 230 lb (104.327 kg).  Physical Exam  General: The patient is alert and cooperative at the time of the examination. The patient is moderately obese.  Neuromuscular: The patient has restriction of abduction of the arms bilaterally, left greater than right, able to get the arms up only to about  90.  Skin: No significant peripheral edema is noted.   Neurologic Exam  Mental status: The patient is oriented x 3.  Cranial nerves: Facial symmetry is present. Speech is normal, no aphasia or dysarthria is noted. Extraocular movements are full. Visual fields are full. Masking of the face is seen.  Motor: The patient has good strength in all 4 extremities.  Sensory examination: Soft touch sensation is symmetric on the face, arms, and legs.  Coordination: The patient has good finger-nose-finger and heel-to-shin bilaterally. A prominent resting tremors noted with the left upper extremity.  Gait and station: The patient  is able to arise from a seated position with arms crossed. Once up, the patient is slightly stooped with posture, has decreased arm swing bilaterally, resting tremor with the left arm while walking. The patient takes good strides, has good turns. Tandem gait is normal. Romberg is negative. No drift is seen.  Reflexes: Deep tendon reflexes are symmetric.   Assessment/Plan:  One. Parkinson's disease  2. Left upper extremity resting tremor  The patient will be increased on the Mirapex to the 1.5 mg tablets, phasing in the medication substituting 1 of the 1.5 mg tablets for a 1 mg tablet every 2 weeks. The patient will continue on the Sinemet taking the 50/200 tablets 4 times daily. The patient will followup in 6 months. The patient will remain active. The patient may take Flexeril at night prior to going to bed, as this may help the tremor, and help him sleep.  Jill Alexanders MD 04/26/2013 5:52 PM  [facility] [address] [place], [place] [address]  Phone [phone] Fax [phone]

## 2013-04-25 NOTE — Patient Instructions (Signed)
With the mirapex, we will phase in the 1.5 mg tablets.  For the first 2 weeks, take 1.5 mg in the morning and 1 mg at noon and in the evening For the next 2 weeks, take 1.5 mg in the morning and evening, and 1 mg at noon Then start 1.5 mg three times a day of the mirapex.    Parkinson Disease Parkinson disease is a disorder of the central nervous system, which includes the brain and spinal cord. A person with this disease slowly loses the ability to completely control body movements. Within the brain, there is a group of nerve cells (basal ganglia) that help control movement. The basal ganglia are damaged and do not work properly in a person with Parkinson disease. In addition, the basal ganglia produce and use a brain chemical called dopamine. The dopamine chemical sends messages to other parts of the body to control and coordinate body movements. Dopamine levels are low in a person with Parkinson disease. If the dopamine levels are low, then the body does not receive the correct messages it needs to move normally.  CAUSES  The exact reason why the basal ganglia get damaged is not known. Some medical researchers have thought that infection, genes, environment, and certain medicines may contribute to the cause.  SYMPTOMS   An early symptom of Parkinson disease is often an uncontrolled shaking (tremor) of the hands. The tremor will often disappear when the affected hand is consciously used.  As the disease progresses, walking, talking, getting out of a chair, and new movements become more difficult.  Muscles get stiff and movements become slower.  Balance and coordination become harder.  Depression, trouble swallowing, urinary problems, constipation, and sleep problems can occur.  Later in the disease, memory and thought processes may deteriorate. DIAGNOSIS  There are no specific tests to diagnose Parkinson disease. You may be referred to a neurologist for evaluation. Your caregiver will ask  about your medical history, symptoms, and perform a physical exam. Blood tests and imaging tests of your brain may be performed to rule out other diseases. The imaging tests may include an MRI or a CT scan. TREATMENT  The goal of treatment is to relieve symptoms. Medicines may be prescribed once the symptoms become troublesome. Medicine will not stop the progression of the disease, but medicine can make movement and balance better and help control tremors. Speech and occupational therapy may also be prescribed. Sometimes, surgical treatment of the brain can be done in young people. HOME CARE INSTRUCTIONS  Get regular exercise and rest periods during the day to help prevent exhaustion and depression.  If getting dressed becomes difficult, replace buttons and zippers with Velcro and elastic on your clothing.  Take all medicine as directed by your caregiver.  Install grab bars or railings in your home to prevent falls.  Go to speech or occupational therapy as directed.  Keep all follow-up visits as directed by your caregiver. SEEK MEDICAL CARE IF:  Your symptoms are not controlled with your medicine.  You fall.  You have trouble swallowing or choke on your food. MAKE SURE YOU:  Understand these instructions.  Will watch your condition.  Will get help right away if you are not doing well or get worse. Document Released: 02/04/2000 Document Revised: 06/03/2012 Document Reviewed: 03/08/2011 [facility] Patient Information 2014 [place], [place].

## 2013-10-28 ENCOUNTER — Telehealth: Payer: Self-pay | Admitting: Neurology

## 2013-10-28 ENCOUNTER — Ambulatory Visit (INDEPENDENT_AMBULATORY_CARE_PROVIDER_SITE_OTHER): Payer: Medicare Other | Admitting: Neurology

## 2013-10-28 ENCOUNTER — Encounter: Payer: Self-pay | Admitting: Neurology

## 2013-10-28 ENCOUNTER — Encounter (INDEPENDENT_AMBULATORY_CARE_PROVIDER_SITE_OTHER): Payer: Self-pay

## 2013-10-28 VITALS — BP 128/67 | HR 63 | Ht 67.5 in | Wt 243.0 lb

## 2013-10-28 DIAGNOSIS — E669 Obesity, unspecified: Secondary | ICD-10-CM

## 2013-10-28 DIAGNOSIS — R400 Somnolence: Secondary | ICD-10-CM

## 2013-10-28 DIAGNOSIS — G20 Parkinson's disease: Secondary | ICD-10-CM

## 2013-10-28 DIAGNOSIS — R0683 Snoring: Secondary | ICD-10-CM

## 2013-10-28 DIAGNOSIS — R351 Nocturia: Secondary | ICD-10-CM

## 2013-10-28 DIAGNOSIS — G479 Sleep disorder, unspecified: Secondary | ICD-10-CM

## 2013-10-28 NOTE — Progress Notes (Signed)
Reason for visit: Parkinson's disease  [name] is an 73 y.o. male  History of present illness:  Elijah [name] is a 73 year old left-handed white male with a history of Parkinson's disease. The patient has a prominent left upper extremity tremor. The patient has a stooped posture, and he recently has noted that he will have a tendency to lean to the right side when he is sitting. The patient has reported no falls, but occasionally he will become over balanced with a tendency to go forward. The patient has a walking stick that he uses at times. The patient has noted some increased problems with drowsiness during the day. His wife notes that when he drives long distances, he will have a tendency to fall sleep. The patient has been placed on Lasix taking 20 mg twice daily, and his last dose is 3 hours before bedtime. The patient has to get up frequently at night to urinate. The patient is apparently also on hydrochlorothiazide, but he is not sure what time of day that he takes that medication. He denies any problems with swallowing. He denies any vivid dreams at night. He does snore at night. He returns to this office for an evaluation.  Past Medical History  Diagnosis Date  . Parkinson disease   . Diabetes mellitus without complication   . Hypertension   . Gout 10/07/2012  . Obesity   . Degenerative arthritis   . Low back pain   . Dyslipidemia   . Glaucoma   . Rotator cuff tear     Bilateral, no surgery    Past Surgical History  Procedure Laterality Date  . Appendectomy    . Hernia repair    . Tonsillectomy    . Shoulder bone spur Left     RESECTION    Family History  Problem Relation Age of Onset  . Stroke Mother   . Diabetes Mother   . Heart Problems Mother   . Parkinsonism Father   . Heart Problems Father     Social history:  reports that he has never smoked. He has never used smokeless tobacco. He reports that he does not drink alcohol or use illicit drugs.      Allergies  Allergen Reactions  . Aspirin   . Penicillins     Medications:  Current Outpatient Prescriptions on File Prior to Visit  Medication Sig Dispense Refill  . allopurinol (ZYLOPRIM) 100 MG tablet Take 100 mg by mouth daily.       Marland Kitchen aspirin 81 MG tablet Take 81 mg by mouth daily.      . Azelastine HCl 0.15 % SOLN Place 1 spray into the nose 2 (two) times daily as needed.       . carbidopa-levodopa (SINEMET CR) 50-200 MG per tablet Take 1 tablet by mouth 4 (four) times daily.  120 tablet  11  . cyclobenzaprine (FLEXERIL) 10 MG tablet Take 1 tablet (10 mg total) by mouth 2 (two) times daily as needed for muscle spasms.  20 tablet  0  . LORazepam (ATIVAN) 1 MG tablet Take 1 mg by mouth as needed for anxiety.      . meloxicam (MOBIC) 15 MG tablet Take 15 mg by mouth daily as needed.       . metFORMIN (GLUCOPHAGE) 500 MG tablet Take 500 mg by mouth 2 (two) times daily with a meal.       . metoprolol tartrate (LOPRESSOR) 25 MG tablet Take 25 mg by mouth daily.  1/2 TABLET DAILY      . NEXIUM 40 MG capsule Take 40 mg by mouth daily before breakfast.       . niacin 500 MG CR capsule Take 500 mg by mouth at bedtime.      . pramipexole (MIRAPEX) 1.5 MG tablet Take 1 tablet (1.5 mg total) by mouth 3 (three) times daily.  90 tablet  11  . simvastatin (ZOCOR) 40 MG tablet Take 40 mg by mouth at bedtime.       . valsartan-hydrochlorothiazide (DIOVAN-HCT) 80-12.5 MG per tablet Take 1 tablet by mouth daily.       No current facility-administered medications on file prior to visit.    ROS:  Out of a complete 14 system review of symptoms, the patient complains only of the following symptoms, and all other reviewed systems are negative.  Shortness of breath Leg swelling Daytime sleepiness, snoring  Blood pressure 128/67, pulse 63, height 5' 7.5" (1.715 m), weight 243 lb (110.224 kg).  Physical Exam  General: The patient is alert and cooperative at the time of the examination. The patient  is moderately obese.  Skin: 2+ edema below the knees is noted bilaterally.   Neurologic Exam  Mental status: The patient is oriented x 3.  Cranial nerves: Facial symmetry is present. Speech is normal, no aphasia or dysarthria is noted. Extraocular movements are full. Visual fields are full.  Motor: The patient has good strength in all 4 extremities.  Sensory examination: Soft touch sensation is symmetric on the face, arms, and legs.  Coordination: The patient has good finger-nose-finger and heel-to-shin bilaterally. The patient has a prominent resting tremor of the left arm.  Gait and station: The patient is able to arise from a seated position with arms crossed. Once up, the patient has a stooped posture, has decreased arm swing bilaterally, prominent tremor on the left arm with walking. Tandem gait is slightly unsteady. Romberg is negative. No drift is seen.  Reflexes: Deep tendon reflexes are symmetric.   Assessment/Plan:  One. Parkinson's disease  2. Resting tremor, left upper extremity  3. Excessive daytime drowsiness  The patient is reporting increasing problems with drowsiness, falling asleep with driving. He does snore at night. He will be set up for a sleep evaluation. He will followup through this office in about 4 months. The patient may need to take his Lasix earlier during the day as this may keep him up at night.  Jill Alexanders MD 10/28/2013 7:49 PM  [facility] [address] [place], [place] [address]  Phone [phone] Fax [phone]

## 2013-10-28 NOTE — Telephone Encounter (Signed)
Sleep study request review: This patient has an underlying medical history of Parkinson's disease, obesity, hypertension, diabetes and is referred by Dr. Jannifer Franklin for an attended sleep study due to a report of snoring, nocturia, and excessive daytime somnolence. I will order a split-night sleep study and see the patient in sleep medicine consultation afterwards. Star Age, MD, PhD [facility])

## 2013-10-28 NOTE — Telephone Encounter (Signed)
.   Dr. Lenor Coffin is referring Elijah [name], 73 y.o. male, for an attended sleep study.  Wt: 243 lbs Ht: 67.5 in BMI: 37.48  Diagnoses: Morbid Obesity Snoring  Nocturia Excessive Daytime Sleepiness Hypertension Diabetes Mellitus Parkinson's Disease  Medication List: Current Outpatient Prescriptions  Medication Sig Dispense Refill  . allopurinol (ZYLOPRIM) 100 MG tablet Take 100 mg by mouth daily.       Marland Kitchen aspirin 81 MG tablet Take 81 mg by mouth daily.      . Azelastine HCl 0.15 % SOLN Place 1 spray into the nose 2 (two) times daily as needed.       . carbidopa-levodopa (SINEMET CR) 50-200 MG per tablet Take 1 tablet by mouth 4 (four) times daily.  120 tablet  11  . cyclobenzaprine (FLEXERIL) 10 MG tablet Take 1 tablet (10 mg total) by mouth 2 (two) times daily as needed for muscle spasms.  20 tablet  0  . furosemide (LASIX) 20 MG tablet 1 tablet 2 (two) times daily.      . hydrocortisone 2.5 % cream As needed      . LORazepam (ATIVAN) 1 MG tablet Take 1 mg by mouth as needed for anxiety.      . meloxicam (MOBIC) 15 MG tablet Take 15 mg by mouth daily as needed.       . metFORMIN (GLUCOPHAGE) 500 MG tablet Take 500 mg by mouth 2 (two) times daily with a meal.       . metoprolol tartrate (LOPRESSOR) 25 MG tablet Take 25 mg by mouth daily. 1/2 TABLET DAILY      . NEXIUM 40 MG capsule Take 40 mg by mouth daily before breakfast.       . niacin 500 MG CR capsule Take 500 mg by mouth at bedtime.      . potassium chloride SA (K-DUR,KLOR-CON) 20 MEQ tablet 1 tablet daily.      . pramipexole (MIRAPEX) 1.5 MG tablet Take 1 tablet (1.5 mg total) by mouth 3 (three) times daily.  90 tablet  11  . simvastatin (ZOCOR) 40 MG tablet Take 40 mg by mouth at bedtime.       . valsartan-hydrochlorothiazide (DIOVAN-HCT) 80-12.5 MG per tablet Take 1 tablet by mouth daily.       No current facility-administered medications for this visit.    This patient presents to Dr. Lenor Coffin  in follow up for paralysis agitans.  During this visit, he complains of excessive daytime sleepiness - even falling asleep while driving.  Wife reports that the patient does snore at night and urinates frequently.  Please review for an attended sleep study.  Insurance:  MEDICARE/[employer]

## 2013-10-28 NOTE — Patient Instructions (Signed)
Parkinson Disease Parkinson disease is a disorder of the central nervous system, which includes the brain and spinal cord. A person with this disease slowly loses the ability to completely control body movements. Within the brain, there is a group of nerve cells (basal ganglia) that help control movement. The basal ganglia are damaged and do not work properly in a person with Parkinson disease. In addition, the basal ganglia produce and use a brain chemical called dopamine. The dopamine chemical sends messages to other parts of the body to control and coordinate body movements. Dopamine levels are low in a person with Parkinson disease. If the dopamine levels are low, then the body does not receive the correct messages it needs to move normally.  CAUSES  The exact reason why the basal ganglia get damaged is not known. Some medical researchers have thought that infection, genes, environment, and certain medicines may contribute to the cause.  SYMPTOMS   An early symptom of Parkinson disease is often an uncontrolled shaking (tremor) of the hands. The tremor will often disappear when the affected hand is consciously used.  As the disease progresses, walking, talking, getting out of a chair, and new movements become more difficult.  Muscles get stiff and movements become slower.  Balance and coordination become harder.  Depression, trouble swallowing, urinary problems, constipation, and sleep problems can occur.  Later in the disease, memory and thought processes may deteriorate. DIAGNOSIS  There are no specific tests to diagnose Parkinson disease. You may be referred to a neurologist for evaluation. Your caregiver will ask about your medical history, symptoms, and perform a physical exam. Blood tests and imaging tests of your brain may be performed to rule out other diseases. The imaging tests may include an MRI or a CT scan. TREATMENT  The goal of treatment is to relieve symptoms. Medicines may be  prescribed once the symptoms become troublesome. Medicine will not stop the progression of the disease, but medicine can make movement and balance better and help control tremors. Speech and occupational therapy may also be prescribed. Sometimes, surgical treatment of the brain can be done in young people. HOME CARE INSTRUCTIONS  Get regular exercise and rest periods during the day to help prevent exhaustion and depression.  If getting dressed becomes difficult, replace buttons and zippers with Velcro and elastic on your clothing.  Take all medicine as directed by your caregiver.  Install grab bars or railings in your home to prevent falls.  Go to speech or occupational therapy as directed.  Keep all follow-up visits as directed by your caregiver. SEEK MEDICAL CARE IF:  Your symptoms are not controlled with your medicine.  You fall.  You have trouble swallowing or choke on your food. MAKE SURE YOU:  Understand these instructions.  Will watch your condition.  Will get help right away if you are not doing well or get worse. Document Released: 02/04/2000 Document Revised: 06/03/2012 Document Reviewed: 03/08/2011 [employer] Patient Information 2015 [employer]. This information is not intended to replace advice given to you by your health care provider. Make sure you discuss any questions you have with your health care provider.  

## 2013-10-29 ENCOUNTER — Other Ambulatory Visit: Payer: Self-pay | Admitting: Neurology

## 2013-12-10 ENCOUNTER — Ambulatory Visit (INDEPENDENT_AMBULATORY_CARE_PROVIDER_SITE_OTHER): Payer: Medicare Other

## 2013-12-10 DIAGNOSIS — R400 Somnolence: Secondary | ICD-10-CM

## 2013-12-10 DIAGNOSIS — E669 Obesity, unspecified: Secondary | ICD-10-CM

## 2013-12-10 DIAGNOSIS — G4710 Hypersomnia, unspecified: Secondary | ICD-10-CM

## 2013-12-10 DIAGNOSIS — G20 Parkinson's disease: Secondary | ICD-10-CM

## 2013-12-10 DIAGNOSIS — R351 Nocturia: Secondary | ICD-10-CM

## 2013-12-10 DIAGNOSIS — R0683 Snoring: Secondary | ICD-10-CM

## 2013-12-10 DIAGNOSIS — G4733 Obstructive sleep apnea (adult) (pediatric): Secondary | ICD-10-CM

## 2013-12-19 ENCOUNTER — Telehealth: Payer: Self-pay | Admitting: Neurology

## 2013-12-19 NOTE — Telephone Encounter (Signed)
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 Please explain to patient and arrange for an appt.  Please route copy of sleep study to referring physician and primary care physician. Star Age, MD, PhD [facility])

## 2013-12-30 ENCOUNTER — Ambulatory Visit (INDEPENDENT_AMBULATORY_CARE_PROVIDER_SITE_OTHER): Payer: Medicare Other | Admitting: Neurology

## 2013-12-30 ENCOUNTER — Encounter: Payer: Self-pay | Admitting: Neurology

## 2013-12-30 VITALS — BP 130/71 | HR 62 | Temp 98.1°F | Ht 67.5 in | Wt 240.0 lb

## 2013-12-30 DIAGNOSIS — F40240 Claustrophobia: Secondary | ICD-10-CM

## 2013-12-30 DIAGNOSIS — G20 Parkinson's disease: Secondary | ICD-10-CM

## 2013-12-30 DIAGNOSIS — G4733 Obstructive sleep apnea (adult) (pediatric): Secondary | ICD-10-CM

## 2013-12-30 DIAGNOSIS — Z91048 Other nonmedicinal substance allergy status: Secondary | ICD-10-CM

## 2013-12-30 DIAGNOSIS — E669 Obesity, unspecified: Secondary | ICD-10-CM

## 2013-12-30 DIAGNOSIS — Z9109 Other allergy status, other than to drugs and biological substances: Secondary | ICD-10-CM

## 2013-12-30 DIAGNOSIS — R0981 Nasal congestion: Secondary | ICD-10-CM

## 2013-12-30 NOTE — Patient Instructions (Addendum)
I will refer you to ENT for your allergy symptoms. Please let me know, once you have seen an ENT specialist.  After that, we will likely bring you back for a daytime appointment for desensitization with CPAP therapy in the sleep lab.

## 2013-12-30 NOTE — Progress Notes (Signed)
Subjective:    Patient ID: CORDIE BUENING is a 73 y.o. male.  HPI     Star Age, MD, PhD [facility] [address], [address] [place], [place] [address]  Dear Lanny Hurst,   I saw your patient, Tyrrell Stephens, upon your kind request in my clinic today for initial consultation of his sleep disorder, in particular after his recent sleep study. The patient is accompanied by his wife today. As you know, Mr. 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 He had a baseline sleep study on 12/10/2013 and went over his test results with him in detail today. His sleep efficiency was reduced at 70.2% with a latency to sleep of 13 minutes and wake after sleep onset of 125 minutes with moderate sleep fragmentation noted. He had increased percentage of stage II sleep, near absence of deep sleep and a near absence of REM sleep. His REM latency was mildly prolonged. Of note, the patient reported that he was claustrophobic and did not like the room door closed or anything on his face or in his nose. He did tolerate the thermistor and the PTAF after the tech talked to him. He had overall mild PLMS with little arousals. He had mild to moderate and at times loud snoring. He had 242 obstructive hypopneas. AHI was 44.6 per hour. Baseline oxygen saturation was 94%, nadir was 86%. His sleep study showed evidence of severe sleep apnea but I suggested we see him back in clinic first before we try to treat him with CPAP due to his significant claustrophobia reported. Today, he reports, that he has recurrent nasal congestion and suffers from post-nasal drip. He is on allegra and mucinex multiple times a day, tried nasal steroid spray. He has trouble with  nasal breathing and often by the end of the day has to breathe from his mouth. He has never seen an allergy specialist or ENT doctor. He is not sure if he could use CPAP. His Epworth sleepiness score is 15 today. He goes to bed at 10 unusual rise time is 6. He does take some naps during the day and his wife endorses that he tends to get sleepy during the day.   His Past Medical History Is Significant For: Past Medical History  Diagnosis Date  . Parkinson disease   . Diabetes mellitus without complication   . Hypertension   . Gout 10/07/2012  . Obesity   . Degenerative arthritis   . Low back pain   . Dyslipidemia   . Glaucoma   . Rotator cuff tear     Bilateral, no surgery    His Past Surgical History Is Significant For: Past Surgical History  Procedure Laterality Date  . Appendectomy    . Hernia repair    . Tonsillectomy    . Shoulder bone spur Left     RESECTION    His Family History Is Significant For: Family History  Problem Relation Age of Onset  . Stroke Mother   . Diabetes Mother   . Heart Problems Mother   . Parkinsonism Father   . Heart Problems Father     His Social History Is Significant For: History   Social History  . Marital Status: Married    Spouse Name: Vaughan Basta  Number of Children: 4  . Years of Education: college   Occupational History  . Retired    Social History Main Topics  . Smoking status: Never Smoker   . Smokeless tobacco: Never Used  . Alcohol Use: No  . Drug Use: No  . Sexual Activity: None   Other Topics Concern  . None   Social History Narrative   Patient is married Vaughan Basta) and lives at home with his wife.   Patient has four adult children.   Patient is retired.   Patient has a college education.   Patient is left-handed.   Patient drinks one soda and 1 glass of tea.    His Allergies Are:  Allergies  Allergen Reactions  . Aspirin   . Penicillins   :   His Current Medications Are:  Outpatient Encounter  Prescriptions as of 12/30/2013  Medication Sig  . ACCU-CHEK AVIVA PLUS test strip   . allopurinol (ZYLOPRIM) 100 MG tablet Take 100 mg by mouth daily.   Marland Kitchen aspirin 81 MG tablet Take 81 mg by mouth daily.  . carbidopa-levodopa (SINEMET CR) 50-200 MG per tablet take 1 tablet by mouth four times a day  . cyclobenzaprine (FLEXERIL) 10 MG tablet Take 1 tablet (10 mg total) by mouth 2 (two) times daily as needed for muscle spasms.  . furosemide (LASIX) 20 MG tablet 1 tablet 2 (two) times daily.  . hydrocortisone 2.5 % cream As needed  . LORazepam (ATIVAN) 1 MG tablet Take 1 mg by mouth as needed for anxiety.  . meloxicam (MOBIC) 15 MG tablet Take 15 mg by mouth daily as needed.   . metFORMIN (GLUCOPHAGE) 500 MG tablet Take 500 mg by mouth 2 (two) times daily with a meal.   . metoprolol tartrate (LOPRESSOR) 25 MG tablet Take 25 mg by mouth daily. 1/2 TABLET DAILY  . NEXIUM 40 MG capsule Take 40 mg by mouth daily before breakfast.   . potassium chloride SA (K-DUR,KLOR-CON) 20 MEQ tablet 1 tablet daily as needed.   . pramipexole (MIRAPEX) 1.5 MG tablet Take 1 tablet (1.5 mg total) by mouth 3 (three) times daily.  Marland Kitchen SALINE MIST SPRAY NA Place into the nose as needed.  . simvastatin (ZOCOR) 40 MG tablet Take 40 mg by mouth at bedtime.   . valsartan-hydrochlorothiazide (DIOVAN-HCT) 80-12.5 MG per tablet Take 1 tablet by mouth daily.  . [DISCONTINUED] Azelastine HCl 0.15 % SOLN Place 1 spray into the nose 2 (two) times daily as needed.   . [DISCONTINUED] niacin 500 MG CR capsule Take 500 mg by mouth at bedtime.  :  Review of Systems:  Out of a complete 14 point review of systems, all are reviewed and negative with the exception of these symptoms as listed below:   Review of Systems  Neurological:       Snoring    Objective:  Neurologic Exam  Physical Exam Physical Examination:   Filed Vitals:   12/30/13 1524  BP: 130/71  Pulse: 62  Temp: 98.1 F (36.7 C)   Physical Examination:    Filed Vitals:   12/30/13 1524  BP: 130/71  Pulse: 62  Temp: 98.1 F (36.7 C)   General Examination: The patient is a very pleasant 73 y.o. male in no acute distress.  HEENT: Normocephalic, atraumatic, pupils are equal, round and reactive to light and accommodation. Funduscopic exam is normal with sharp disc margins noted. Extraocular tracking shows mild saccadic breakdown without nystagmus noted. There is limitation to upper gaze. There  is mild decrease in eye blink rate. Hearing is intact. Tympanic membranes are clear bilaterally. Face is symmetric with mild facial masking and normal facial sensation. There is no lip, neck or jaw tremor. Neck is mildly rigid with intact passive ROM. There are no carotid bruits on auscultation. Oropharynx exam reveals mild mouth dryness and moderate airway crowding is noted secondary to thicker tongue and redundant soft palate. I did not see the tip of his uvula. Tonsils are absent. Neck size is 18-7/8 inches. On nasal inspection I did not see any significant nasal congestion or deviated septum or inferior turbinate hypertrophy. He has a near absent overbite.  Chest: is clear to auscultation without wheezing, rhonchi or crackles noted.  Heart: sounds are regular and normal without murmurs, rubs or gallops noted.   Abdomen: is soft, non-tender and non-distended with normal bowel sounds appreciated on auscultation.  Extremities: There is no pitting edema in the distal lower extremities bilaterally.   Skin: is warm and dry with no trophic changes noted. Age-related changes are noted on the skin.   Musculoskeletal: exam reveals no obvious joint deformities, tenderness, joint swelling or erythema.  Neurologically:  Mental status: The patient is awake and alert, paying good  attention. He is able to completely provide the history. His wife provides details. He is oriented to: person, place, time/date, situation, day of week, month of year and year. His memory,  attention, language and knowledge are intact. There is no aphasia, agnosia, apraxia or anomia. There is a mild degree of bradyphrenia. Speech is mildly hypophonic with no dysarthria noted. Mood is congruent and affect is normal.   Cranial nerves are as described above under HEENT exam. In addition, shoulder shrug is normal with equal shoulder height noted.  Motor exam: Normal bulk, and strength for age is noted. There are no dyskinesias noted. Tone is mildly rigid with presence of cogwheeling in the left upper extremity. There is overall mild bradykinesia. There is no drift or rebound.  There is a moderate resting tremor in the left upper extremity.  Romberg is negative.  Reflexes are 1+ in the upper extremities and trace in the lower extremities. Toes are downgoing bilaterally.  Fine motor skills exam: Findings motor skills are mild to moderately impaired on the left and fairly well-preserved on the right.  Cerebellar testing shows no dysmetria or intention tremor on finger to nose testing. Heel to shin is unremarkable bilaterally. There is no truncal or gait ataxia.   Sensory exam is intact to light touch.   Gait, station and balance: He stands up from the seated position with mild difficulty and does not need to push up with His hands. He needs no assistance. No veering to one side is noted. He is noted to lean to the right. Posture is mildly stooped. Stance is narrow-based. He walks with decrease in stride length and pace and decreased arm swing on the left. He turns in 3 steps.   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 He has obstructive sleep apnea. He did not tolerate a very brief trial of CPAP because of claustrophobia. He also endorses significant  nasal allergy symptoms, postnasal drip and nasal congestion for which he has mostly tried over-the-counter medications. I had a long chat with the patient and  his about my findings and the diagnosis of OSA, its prognosis and treatment options. We talked about medical treatments, surgical interventions and non-pharmacological approaches. I explained in particular the risks and ramifications of untreated moderate to severe OSA, especially with respect to developing cardiovascular disease down the Road, including congestive heart failure, difficult to treat hypertension, cardiac arrhythmias, or stroke. Even type 2 diabetes has, in part, been linked to untreated OSA. Symptoms of untreated OSA include daytime sleepiness, memory problems, mood irritability and mood disorder such as depression and anxiety, lack of energy, as well as recurrent headaches, especially morning headaches. We talked about trying to maintain a healthy lifestyle in general, as well as the importance of weight control. I encouraged the patient to eat healthy, exercise daily and keep well hydrated, to keep a scheduled bedtime and wake time routine, to not skip any meals and eat healthy snacks in between meals. I advised the patient not to drive when feeling sleepy. I recommended the following at this time: I would like to refer him to ENT to see if there is something that can be done regarding his allergy symptoms. After that we we'll consider bringing him back for a daytime appointment at the sleep lab with our sleep lab manager, during which we would tryout CPAP and different masks in order to achieve some desensitization. He is willing to pursue this route. We did talk about surgical and nonsurgical treatment options. Weight loss is encouraged. He does not have much in the way of overbite, and is certainly not a great candidate for surgery. He also indicates that he did not have good results with anesthesia in the past. At this juncture I will  see him back after he has had ENT evaluation and he is asked to notify me when he has seen the specialist. I made a referral today. He would like to see somebody locally in [place] if possible. We will try to arrange that.  I answered all their questions today and the patient and his wife were in agreement. Thank you very much for allowing me to participate in the care of this nice patient. If I can be of any further assistance to you please do not hesitate to call me.  Sincerely,   Star Age, MD, PhD

## 2013-12-31 ENCOUNTER — Ambulatory Visit: Payer: Medicare Other | Admitting: Neurology

## 2014-01-07 ENCOUNTER — Encounter: Payer: Self-pay | Admitting: Neurology

## 2014-01-13 ENCOUNTER — Encounter: Payer: Self-pay | Admitting: Neurology

## 2014-03-10 ENCOUNTER — Ambulatory Visit: Payer: Medicare Other | Admitting: Neurology

## 2014-04-07 ENCOUNTER — Ambulatory Visit: Payer: Medicare Other | Admitting: Neurology

## 2014-04-07 ENCOUNTER — Telehealth: Payer: Self-pay | Admitting: Neurology

## 2014-04-07 NOTE — Telephone Encounter (Signed)
This patient canceled his appointment within 24 hours of appointment date.

## 2014-04-10 ENCOUNTER — Encounter: Payer: Self-pay | Admitting: Neurology

## 2014-04-10 ENCOUNTER — Ambulatory Visit (INDEPENDENT_AMBULATORY_CARE_PROVIDER_SITE_OTHER): Payer: PPO | Admitting: Neurology

## 2014-04-10 VITALS — BP 130/65 | HR 72 | Ht 68.0 in | Wt 240.8 lb

## 2014-04-10 DIAGNOSIS — G4733 Obstructive sleep apnea (adult) (pediatric): Secondary | ICD-10-CM

## 2014-04-10 DIAGNOSIS — G20 Parkinson's disease: Secondary | ICD-10-CM

## 2014-04-10 HISTORY — DX: Obstructive sleep apnea (adult) (pediatric): G47.33

## 2014-04-10 NOTE — Progress Notes (Signed)
Reason for visit: Parkinson's disease  Elijah [name] is an 74 y.o. male  History of present illness:  Elijah [name] is a 74 year old left-handed white male with a history of Parkinson's disease associated with the left upper extremity tremor. The patient has had good maintenance of his mobility since last seen, he remains on Mirapex and Sinemet CR 50/200 mg tablets. The patient has had excessive daytime drowsiness, a sleep study showed evidence of obstructive sleep apnea. The patient however has severe claustrophobia, and positive airway pressure makes him feel like he is smothering. He has failed to tolerate the CPAP machine. The patient went to an ENT physician to evaluate his upper airway, no obstructions were noted. The patient returns for an evaluation. He has not had any falls, he indicates that he is remaining active but he is overweight, and he has not been able to lose weight. He reports that his posture is stooped, and this is creating some back pain. He has a tendency to lean towards the right. He denies any problems with swallowing or choking.  Past Medical History  Diagnosis Date  . Parkinson disease   . Diabetes mellitus without complication   . Hypertension   . Gout 10/07/2012  . Obesity   . Degenerative arthritis   . Low back pain   . Dyslipidemia   . Glaucoma   . Rotator cuff tear     Bilateral, no surgery  . OSA (obstructive sleep apnea) 04/10/2014    Past Surgical History  Procedure Laterality Date  . Appendectomy    . Hernia repair    . Tonsillectomy    . Shoulder bone spur Left     RESECTION    Family History  Problem Relation Age of Onset  . Stroke Mother   . Diabetes Mother   . Heart Problems Mother   . Parkinsonism Father   . Heart Problems Father     Social history:  reports that he has never smoked. He has never used smokeless tobacco. He reports that he does not drink alcohol or use illicit drugs.    Allergies  Allergen Reactions  . Aspirin    . Penicillins     Medications:  Current Outpatient Prescriptions on File Prior to Visit  Medication Sig Dispense Refill  . ACCU-CHEK AVIVA PLUS test strip   0  . allopurinol (ZYLOPRIM) 100 MG tablet Take 100 mg by mouth daily.     Marland Kitchen aspirin 81 MG tablet Take 81 mg by mouth daily.    . carbidopa-levodopa (SINEMET CR) 50-200 MG per tablet take 1 tablet by mouth four times a day 120 tablet 6  . cyclobenzaprine (FLEXERIL) 10 MG tablet Take 1 tablet (10 mg total) by mouth 2 (two) times daily as needed for muscle spasms. 20 tablet 0  . furosemide (LASIX) 20 MG tablet 1 tablet 2 (two) times daily.    Marland Kitchen LORazepam (ATIVAN) 1 MG tablet Take 1 mg by mouth as needed for anxiety.    . meloxicam (MOBIC) 15 MG tablet Take 15 mg by mouth daily as needed.     . metFORMIN (GLUCOPHAGE) 500 MG tablet Take 500 mg by mouth 2 (two) times daily with a meal.     . metoprolol tartrate (LOPRESSOR) 25 MG tablet Take 25 mg by mouth daily. 1/2 TABLET DAILY    . potassium chloride SA (K-DUR,KLOR-CON) 20 MEQ tablet 1 tablet daily as needed.     . pramipexole (MIRAPEX) 1.5 MG tablet Take 1 tablet (  1.5 mg total) by mouth 3 (three) times daily. 90 tablet 11  . SALINE MIST SPRAY NA Place into the nose as needed.    . simvastatin (ZOCOR) 40 MG tablet Take 40 mg by mouth at bedtime.     . valsartan-hydrochlorothiazide (DIOVAN-HCT) 80-12.5 MG per tablet Take 1 tablet by mouth daily.     No current facility-administered medications on file prior to visit.    ROS:  Out of a complete 14 system review of symptoms, the patient complains only of the following symptoms, and all other reviewed systems are negative.  Light sensitivity Joint pain, back pain Snoring  Blood pressure 130/65, pulse 72, height 5\' 8"  (1.727 m), weight 240 lb 12.8 oz (109.226 kg).  Physical Exam  General: The patient is alert and cooperative at the time of the examination. The patient is moderately to markedly obese.  Skin: No significant  peripheral edema is noted.   Neurologic Exam  Mental status: The patient is oriented x 3.  Cranial nerves: Facial symmetry is present. Speech is normal, no aphasia or dysarthria is noted. Extraocular movements are full. Visual fields are full. Mild masking of the face is seen.  Motor: The patient has good strength in all 4 extremities.  Sensory examination: Soft touch sensation is symmetric on the face, arms, and legs.  Coordination: The patient has good finger-nose-finger and heel-to-shin bilaterally. The patient has a prominent resting tremor on the left upper extremity.  Gait and station: The patient is able to arise from a sitting position with arms crossed. Once up, he ambulates without assistance, decreased arm swing on the left, prominent resting tremor on the left. Tandem gait is slightly unsteady. Romberg is negative. No drift is seen.  Reflexes: Deep tendon reflexes are symmetric.   Assessment/Plan:  1. Parkinson's disease  2. Obstructive sleep apnea  The patient has severe claustrophobia that impairs his ability to tolerate the CPAP. The patient is doing well with his Parkinson's, he remains relatively stable. I have recommended an aggressive weight loss program. The patient is to remain active. We will not readjust his Parkinson's medications at this time as he appears to be relatively stable. He will follow-up in 4 or 5 months.  Jill Alexanders MD 04/10/2014 6:38 PM  [facility] [address] [place], [place] [address]  Phone [phone] Fax [phone]

## 2014-04-10 NOTE — Patient Instructions (Signed)
Parkinson Disease Parkinson disease is a disorder of the central nervous system, which includes the brain and spinal cord. A person with this disease slowly loses the ability to completely control body movements. Within the brain, there is a group of nerve cells (basal ganglia) that help control movement. The basal ganglia are damaged and do not work properly in a person with Parkinson disease. In addition, the basal ganglia produce and use a brain chemical called dopamine. The dopamine chemical sends messages to other parts of the body to control and coordinate body movements. Dopamine levels are low in a person with Parkinson disease. If the dopamine levels are low, then the body does not receive the correct messages it needs to move normally.  CAUSES  The exact reason why the basal ganglia get damaged is not known. Some medical researchers have thought that infection, genes, environment, and certain medicines may contribute to the cause.  SYMPTOMS   An early symptom of Parkinson disease is often an uncontrolled shaking (tremor) of the hands. The tremor will often disappear when the affected hand is consciously used.  As the disease progresses, walking, talking, getting out of a chair, and new movements become more difficult.  Muscles get stiff and movements become slower.  Balance and coordination become harder.  Depression, trouble swallowing, urinary problems, constipation, and sleep problems can occur.  Later in the disease, memory and thought processes may deteriorate. DIAGNOSIS  There are no specific tests to diagnose Parkinson disease. You may be referred to a neurologist for evaluation. Your caregiver will ask about your medical history, symptoms, and perform a physical exam. Blood tests and imaging tests of your brain may be performed to rule out other diseases. The imaging tests may include an MRI or a CT scan. TREATMENT  The goal of treatment is to relieve symptoms. Medicines may be  prescribed once the symptoms become troublesome. Medicine will not stop the progression of the disease, but medicine can make movement and balance better and help control tremors. Speech and occupational therapy may also be prescribed. Sometimes, surgical treatment of the brain can be done in young people. HOME CARE INSTRUCTIONS  Get regular exercise and rest periods during the day to help prevent exhaustion and depression.  If getting dressed becomes difficult, replace buttons and zippers with Velcro and elastic on your clothing.  Take all medicine as directed by your caregiver.  Install grab bars or railings in your home to prevent falls.  Go to speech or occupational therapy as directed.  Keep all follow-up visits as directed by your caregiver. SEEK MEDICAL CARE IF:  Your symptoms are not controlled with your medicine.  You fall.  You have trouble swallowing or choke on your food. MAKE SURE YOU:  Understand these instructions.  Will watch your condition.  Will get help right away if you are not doing well or get worse. Document Released: 02/04/2000 Document Revised: 06/03/2012 Document Reviewed: 03/08/2011 [employer] Patient Information 2015 [employer]. This information is not intended to replace advice given to you by your health care provider. Make sure you discuss any questions you have with your health care provider.  

## 2014-05-13 ENCOUNTER — Other Ambulatory Visit: Payer: Self-pay | Admitting: Neurology

## 2014-06-21 ENCOUNTER — Other Ambulatory Visit: Payer: Self-pay | Admitting: Neurology

## 2014-08-01 ENCOUNTER — Encounter (HOSPITAL_COMMUNITY): Payer: Self-pay | Admitting: *Deleted

## 2014-08-01 ENCOUNTER — Emergency Department (HOSPITAL_COMMUNITY)
Admission: EM | Admit: 2014-08-01 | Discharge: 2014-08-01 | Disposition: A | Discharge status: Home / Self Care | Payer: PPO | Attending: Emergency Medicine | Admitting: Emergency Medicine

## 2014-08-01 DIAGNOSIS — Y9389 Activity, other specified: Secondary | ICD-10-CM | POA: Insufficient documentation

## 2014-08-01 DIAGNOSIS — E785 Hyperlipidemia, unspecified: Secondary | ICD-10-CM | POA: Insufficient documentation

## 2014-08-01 DIAGNOSIS — Y9289 Other specified places as the place of occurrence of the external cause: Secondary | ICD-10-CM | POA: Diagnosis not present

## 2014-08-01 DIAGNOSIS — H409 Unspecified glaucoma: Secondary | ICD-10-CM | POA: Diagnosis not present

## 2014-08-01 DIAGNOSIS — Z791 Long term (current) use of non-steroidal anti-inflammatories (NSAID): Secondary | ICD-10-CM | POA: Insufficient documentation

## 2014-08-01 DIAGNOSIS — I10 Essential (primary) hypertension: Secondary | ICD-10-CM | POA: Insufficient documentation

## 2014-08-01 DIAGNOSIS — Z7982 Long term (current) use of aspirin: Secondary | ICD-10-CM | POA: Diagnosis not present

## 2014-08-01 DIAGNOSIS — X088XXA Exposure to other specified smoke, fire and flames, initial encounter: Secondary | ICD-10-CM | POA: Diagnosis not present

## 2014-08-01 DIAGNOSIS — Z79899 Other long term (current) drug therapy: Secondary | ICD-10-CM | POA: Insufficient documentation

## 2014-08-01 DIAGNOSIS — M1990 Unspecified osteoarthritis, unspecified site: Secondary | ICD-10-CM | POA: Insufficient documentation

## 2014-08-01 DIAGNOSIS — E669 Obesity, unspecified: Secondary | ICD-10-CM | POA: Diagnosis not present

## 2014-08-01 DIAGNOSIS — E119 Type 2 diabetes mellitus without complications: Secondary | ICD-10-CM | POA: Insufficient documentation

## 2014-08-01 DIAGNOSIS — M109 Gout, unspecified: Secondary | ICD-10-CM | POA: Diagnosis not present

## 2014-08-01 DIAGNOSIS — T23012A Burn of unspecified degree of left thumb (nail), initial encounter: Secondary | ICD-10-CM | POA: Diagnosis present

## 2014-08-01 DIAGNOSIS — Y998 Other external cause status: Secondary | ICD-10-CM | POA: Insufficient documentation

## 2014-08-01 DIAGNOSIS — T23212A Burn of second degree of left thumb (nail), initial encounter: Secondary | ICD-10-CM | POA: Diagnosis not present

## 2014-08-01 DIAGNOSIS — G20 Parkinson's disease: Secondary | ICD-10-CM | POA: Insufficient documentation

## 2014-08-01 DIAGNOSIS — Z880 Allergy status to penicillin: Secondary | ICD-10-CM | POA: Diagnosis not present

## 2014-08-01 MED ORDER — TETANUS-DIPHTH-ACELL PERTUSSIS 5-2.5-18.5 LF-MCG/0.5 IM SUSP
0.5000 mL | Freq: Once | INTRAMUSCULAR | Status: AC
  Administered 2014-08-01: 0.5 mL via INTRAMUSCULAR
  Filled 2014-08-01: qty 0.5

## 2014-08-01 MED ORDER — SILVER SULFADIAZINE 1 % EX CREA
TOPICAL_CREAM | Freq: Once | CUTANEOUS | Status: AC
  Administered 2014-08-01: 19:00:00 via TOPICAL
  Filled 2014-08-01: qty 50

## 2014-08-01 MED ORDER — HYDROCODONE-ACETAMINOPHEN 5-325 MG PO TABS: 1.0000 | ORAL_TABLET | ORAL | Status: DC | PRN

## 2014-08-01 MED ORDER — OXYCODONE-ACETAMINOPHEN 5-325 MG PO TABS
1.0000 | ORAL_TABLET | Freq: Once | ORAL | Status: AC
  Administered 2014-08-01: 1 via ORAL
  Filled 2014-08-01: qty 1
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
## 2014-08-01 NOTE — Discharge Instructions (Signed)
Return tomorrow for recheck and dressing change. Do not take the narcotic if driving as it will make you sleepy.   Burn Care Your skin is a natural barrier to infection. It is the largest organ of your body. Burns damage this natural protection. To help prevent infection, it is very important to follow your caregiver's instructions in the care of your burn. Burns are classified as:  First degree. There is only redness of the skin (erythema). No scarring is expected.  Second degree. There is blistering of the skin. Scarring may occur with deeper burns.  Third degree. All layers of the skin are injured, and scarring is expected. HOME CARE INSTRUCTIONS   Wash your hands well before changing your bandage.  Change your bandage as often as directed by your caregiver.  Remove the old bandage. If the bandage sticks, you may soak it off with cool, clean water.  Cleanse the burn thoroughly but gently with mild soap and water.  Pat the area dry with a clean, dry cloth.  Apply a thin layer of antibacterial cream to the burn.  Apply a clean bandage as instructed by your caregiver.  Keep the bandage as clean and dry as possible.  Elevate the affected area for the first 24 hours, then as instructed by your caregiver.  Only take over-the-counter or prescription medicines for pain, discomfort, or fever as directed by your caregiver. SEEK IMMEDIATE MEDICAL CARE IF:   You develop excessive pain.  You develop redness, tenderness, swelling, or red streaks near the burn.  The burned area develops yellowish-white fluid (pus) or a bad smell.  You have a fever. MAKE SURE YOU:   Understand these instructions.  Will watch your condition.  Will get help right away if you are not doing well or get worse. Document Released: 02/06/2005 Document Revised: 05/01/2011 Document Reviewed: 06/29/2010 [facility] Patient Information 2015 [place], [place]. This information is not intended to replace advice  given to you by your health care provider. Make sure you discuss any questions you have with your health care provider.
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
## 2014-08-02 ENCOUNTER — Emergency Department (HOSPITAL_COMMUNITY)
Admission: EM | Admit: 2014-08-02 | Discharge: 2014-08-02 | Disposition: A | Discharge status: Home / Self Care | Payer: PPO | Attending: Emergency Medicine | Admitting: Emergency Medicine

## 2014-08-02 ENCOUNTER — Encounter (HOSPITAL_COMMUNITY): Payer: Self-pay | Admitting: Emergency Medicine

## 2014-08-02 DIAGNOSIS — G20 Parkinson's disease: Secondary | ICD-10-CM | POA: Insufficient documentation

## 2014-08-02 DIAGNOSIS — E669 Obesity, unspecified: Secondary | ICD-10-CM | POA: Insufficient documentation

## 2014-08-02 DIAGNOSIS — Z880 Allergy status to penicillin: Secondary | ICD-10-CM | POA: Insufficient documentation

## 2014-08-02 DIAGNOSIS — X088XXD Exposure to other specified smoke, fire and flames, subsequent encounter: Secondary | ICD-10-CM | POA: Diagnosis not present

## 2014-08-02 DIAGNOSIS — I10 Essential (primary) hypertension: Secondary | ICD-10-CM | POA: Diagnosis not present

## 2014-08-02 DIAGNOSIS — Z4800 Encounter for change or removal of nonsurgical wound dressing: Secondary | ICD-10-CM | POA: Diagnosis present

## 2014-08-02 DIAGNOSIS — Z79899 Other long term (current) drug therapy: Secondary | ICD-10-CM | POA: Diagnosis not present

## 2014-08-02 DIAGNOSIS — E119 Type 2 diabetes mellitus without complications: Secondary | ICD-10-CM | POA: Insufficient documentation

## 2014-08-02 DIAGNOSIS — E785 Hyperlipidemia, unspecified: Secondary | ICD-10-CM | POA: Diagnosis not present

## 2014-08-02 DIAGNOSIS — T23212D Burn of second degree of left thumb (nail), subsequent encounter: Secondary | ICD-10-CM | POA: Insufficient documentation

## 2014-08-02 DIAGNOSIS — M109 Gout, unspecified: Secondary | ICD-10-CM | POA: Diagnosis not present

## 2014-08-02 DIAGNOSIS — Z7982 Long term (current) use of aspirin: Secondary | ICD-10-CM | POA: Insufficient documentation

## 2014-08-02 DIAGNOSIS — M1990 Unspecified osteoarthritis, unspecified site: Secondary | ICD-10-CM | POA: Diagnosis not present

## 2014-08-02 NOTE — ED Notes (Signed)
Silvadene applied to burn and finger dressed with telfa and kerlix dressing.

## 2014-08-02 NOTE — Discharge Instructions (Signed)
Burn Care Your skin is a natural barrier to infection. It is the largest organ of your body. Burns damage this natural protection. To help prevent infection, it is very important to follow your caregiver's instructions in the care of your burn. Burns are classified as:  First degree. There is only redness of the skin (erythema). No scarring is expected.  Second degree. There is blistering of the skin. Scarring may occur with deeper burns.  Third degree. All layers of the skin are injured, and scarring is expected. HOME CARE INSTRUCTIONS   Wash your hands well before changing your bandage.  Change your bandage as often as directed by your caregiver.  Remove the old bandage. If the bandage sticks, you may soak it off with cool, clean water.  Cleanse the burn thoroughly but gently with mild soap and water.  Pat the area dry with a clean, dry cloth.  Apply a thin layer of antibacterial cream to the burn.  Apply a clean bandage as instructed by your caregiver.  Keep the bandage as clean and dry as possible.  Elevate the affected area for the first 24 hours, then as instructed by your caregiver.  Only take over-the-counter or prescription medicines for pain, discomfort, or fever as directed by your caregiver. SEEK IMMEDIATE MEDICAL CARE IF:   You develop excessive pain.  You develop redness, tenderness, swelling, or red streaks near the burn.  The burned area develops yellowish-white fluid (pus) or a bad smell.  You have a fever. MAKE SURE YOU:   Understand these instructions.  Will watch your condition.  Will get help right away if you are not doing well or get worse. Document Released: 02/06/2005 Document Revised: 05/01/2011 Document Reviewed: 06/29/2010 [facility] Patient Information 2015 [place], [place]. This information is not intended to replace advice given to you by your health care provider. Make sure you discuss any questions you have with your health care  provider.    Dressing Change A dressing is a material placed over wounds. It keeps the wound clean, dry, and protected from further injury. This provides an environment that favors wound healing.  BEFORE YOU BEGIN  Get your supplies together. Things you may need include:  Saline solution.  Flexible gauze dressing.  Medicated cream.  Tape.  Gloves.  Abdominal dressing pads.  Gauze squares.  Plastic bags.  Take pain medicine 30 minutes before the dressing change if you need it.  Take a shower before you do the first dressing change of the day. Use plastic wrap or a plastic bag to prevent the dressing from getting wet. REMOVING YOUR OLD DRESSING   Wash your hands with soap and water. Dry your hands with a clean towel.  Put on your gloves.  Remove any tape.  Carefully remove the old dressing. If the dressing sticks, you may dampen it with warm water to loosen it, or follow your caregiver's specific directions.  Remove any gauze or packing tape that is in your wound.  Take off your gloves.  Put the gloves, tape, gauze, or any packing tape into a plastic bag. CHANGING YOUR DRESSING  Open the supplies.  Take the cap off the saline solution.  Open the gauze package so that the gauze remains on the inside of the package.  Put on your gloves.  Clean your wound as told by your caregiver.  If you have been told to keep your wound dry, follow those instructions.  Your caregiver may tell you to do one or more of  the following:  Pick up the gauze. Pour the saline solution over the gauze. Squeeze out the extra saline solution.  Put medicated cream or other medicine on your wound if you have been told to do so.  Put the solution soaked gauze only in your wound, not on the skin around it.  Pack your wound loosely or as told by your caregiver.  Put dry gauze on your wound.  Put abdominal dressing pads over the dry gauze if your wet gauze soaks through.  Tape the  abdominal dressing pads in place so they will not fall off. Do not wrap the tape completely around the affected part (arm, leg, abdomen).  Wrap the dressing pads with a flexible gauze dressing to secure it in place.  Take off your gloves. Put them in the plastic bag with the old dressing. Tie the bag shut and throw it away.  Keep the dressing clean and dry until your next dressing change.  Wash your hands. SEEK MEDICAL CARE IF:  Your skin around the wound looks red.  Your wound feels more tender or sore.  You see pus in the wound.  Your wound smells bad.  You have a fever.  Your skin around the wound has a rash that itches and burns.  You see black or yellow skin in your wound that was not there before.  You feel nauseous, throw up, and feel very tired. Document Released: 03/16/2004 Document Revised: 05/01/2011 Document Reviewed: 12/19/2010 [facility] Patient Information 2015 [place], [place]. This information is not intended to replace advice given to you by your health care provider. Make sure you discuss any questions you have with your health care provider.
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
## 2014-10-20 ENCOUNTER — Encounter: Payer: Self-pay | Admitting: Neurology

## 2014-10-20 ENCOUNTER — Ambulatory Visit (INDEPENDENT_AMBULATORY_CARE_PROVIDER_SITE_OTHER): Payer: PPO | Admitting: Neurology

## 2014-10-20 VITALS — BP 119/69 | HR 68 | Ht 68.0 in | Wt 237.0 lb

## 2014-10-20 DIAGNOSIS — G20 Parkinson's disease: Secondary | ICD-10-CM

## 2014-10-20 MED ORDER — GABAPENTIN 100 MG PO CAPS: 100.0000 mg | ORAL_CAPSULE | Freq: Three times a day (TID) | ORAL | Status: DC

## 2014-10-20 MED ORDER — CARBIDOPA-LEVODOPA 25-100 MG PO TABS: 1.0000 | ORAL_TABLET | ORAL | Status: DC

## 2014-10-20 MED ORDER — PRAMIPEXOLE DIHYDROCHLORIDE 1.5 MG PO TABS: 1.5000 mg | ORAL_TABLET | Freq: Three times a day (TID) | ORAL | Status: DC

## 2014-10-20 NOTE — Patient Instructions (Addendum)
   We will start gabapentin for the back pain, and start low dose short acting Sinemet in the morning with the long acting Sinemet.   Parkinson Disease Parkinson disease is a disorder of the central nervous system, which includes the brain and spinal cord. A person with this disease slowly loses the ability to completely control body movements. Within the brain, there is a group of nerve cells (basal ganglia) that help control movement. The basal ganglia are damaged and do not work properly in a person with Parkinson disease. In addition, the basal ganglia produce and use a brain chemical called dopamine. The dopamine chemical sends messages to other parts of the body to control and coordinate body movements. Dopamine levels are low in a person with Parkinson disease. If the dopamine levels are low, then the body does not receive the correct messages it needs to move normally.  CAUSES  The exact reason why the basal ganglia get damaged is not known. Some medical researchers have thought that infection, genes, environment, and certain medicines may contribute to the cause.  SYMPTOMS   An early symptom of Parkinson disease is often an uncontrolled shaking (tremor) of the hands. The tremor will often disappear when the affected hand is consciously used.  As the disease progresses, walking, talking, getting out of a chair, and new movements become more difficult.  Muscles get stiff and movements become slower.  Balance and coordination become harder.  Depression, trouble swallowing, urinary problems, constipation, and sleep problems can occur.  Later in the disease, memory and thought processes may deteriorate. DIAGNOSIS  There are no specific tests to diagnose Parkinson disease. You may be referred to a neurologist for evaluation. Your caregiver will ask about your medical history, symptoms, and perform a physical exam. Blood tests and imaging tests of your brain may be performed to rule out other  diseases. The imaging tests may include an MRI or a CT scan. TREATMENT  The goal of treatment is to relieve symptoms. Medicines may be prescribed once the symptoms become troublesome. Medicine will not stop the progression of the disease, but medicine can make movement and balance better and help control tremors. Speech and occupational therapy may also be prescribed. Sometimes, surgical treatment of the brain can be done in young people. HOME CARE INSTRUCTIONS  Get regular exercise and rest periods during the day to help prevent exhaustion and depression.  If getting dressed becomes difficult, replace buttons and zippers with Velcro and elastic on your clothing.  Take all medicine as directed by your caregiver.  Install grab bars or railings in your home to prevent falls.  Go to speech or occupational therapy as directed.  Keep all follow-up visits as directed by your caregiver. SEEK MEDICAL CARE IF:  Your symptoms are not controlled with your medicine.  You fall.  You have trouble swallowing or choke on your food. MAKE SURE YOU:  Understand these instructions.  Will watch your condition.  Will get help right away if you are not doing well or get worse. Document Released: 02/04/2000 Document Revised: 06/03/2012 Document Reviewed: 03/08/2011 [facility] Patient Information 2015 [place], [place]. This information is not intended to replace advice given to you by your health care provider. Make sure you discuss any questions you have with your health care provider.

## 2014-10-20 NOTE — Progress Notes (Signed)
Reason for visit: Parkinson's disease  Elijah [name] is an 74 y.o. male  History of present illness:  Elijah [name] is a 74 year old left-handed white male with a history of Parkinson's disease. Since last seen, he has had increasing problems with leaning to the right. He has not fallen because of this, but occasionally when he walks he will feel as if his body gets ahead of his feet. He uses a cane for ambulation. He denies any issues with swallowing or choking. He is not sleeping well at night because he takes Lasix twice a day, and he is up going to the bathroom frequently at night because of this. He reports a chronic history of low back pain, this has worsened recently, without radiation into the legs. The pain is worse when he is up on his feet. The patient has a back brace, but he does not use it because it is too hot in the summertime. The denies issues controlling the bowels or the bladder. The patient occasionally will have some left arm tingling when he reaches for something with the left arm. He denies any neck pain. He reports some difficulty getting going in the morning, he is quite slow at times.  Past Medical History  Diagnosis Date  . Parkinson disease   . Diabetes mellitus without complication   . Hypertension   . Gout 10/07/2012  . Obesity   . Degenerative arthritis   . Low back pain   . Dyslipidemia   . Glaucoma   . Rotator cuff tear     Bilateral, no surgery  . OSA (obstructive sleep apnea) 04/10/2014    Past Surgical History  Procedure Laterality Date  . Appendectomy    . Hernia repair    . Tonsillectomy    . Shoulder bone spur Left     RESECTION    Family History  Problem Relation Age of Onset  . Stroke Mother   . Diabetes Mother   . Heart Problems Mother   . Parkinsonism Father   . Heart Problems Father     Social history:  reports that he has never smoked. He has never used smokeless tobacco. He reports that he does not drink alcohol or use  illicit drugs.    Allergies  Allergen Reactions  . Aspirin   . Penicillins     Medications:  Prior to Admission medications   Medication Sig Start Date End Date Taking? Authorizing Provider  ACCU-CHEK AVIVA PLUS test strip  12/05/13  Yes Historical Provider, MD  allopurinol (ZYLOPRIM) 100 MG tablet Take 100 mg by mouth daily.  10/05/12  Yes Historical Provider, MD  aspirin 81 MG tablet Take 81 mg by mouth daily.   Yes Historical Provider, MD  carbidopa-levodopa (SINEMET CR) 50-200 MG per tablet take 1 tablet four times a day 06/22/14  Yes Kathrynn Ducking, MD  cyclobenzaprine (FLEXERIL) 10 MG tablet Take 1 tablet (10 mg total) by mouth 2 (two) times daily as needed for muscle spasms. 07/02/12  Yes Teressa Lower, MD  furosemide (LASIX) 20 MG tablet 1 tablet 2 (two) times daily. 10/02/13  Yes Historical Provider, MD  HYDROcodone-acetaminophen (NORCO/VICODIN) 5-325 MG per tablet Take 1 tablet by mouth every 4 (four) hours as needed. 08/01/14  Yes Hope Bunnie Pion, NP  LORazepam (ATIVAN) 1 MG tablet Take 1 mg by mouth as needed for anxiety.   Yes Historical Provider, MD  meloxicam (MOBIC) 15 MG tablet Take 15 mg by mouth daily as needed.  09/30/12  Yes Historical Provider, MD  metFORMIN (GLUCOPHAGE) 500 MG tablet Take 500 mg by mouth 2 (two) times daily with a meal.  10/05/12  Yes Historical Provider, MD  metoprolol tartrate (LOPRESSOR) 25 MG tablet Take 25 mg by mouth daily. 1/2 TABLET DAILY   Yes Historical Provider, MD  pantoprazole (PROTONIX) 40 MG tablet Take 40 mg by mouth daily. 03/08/14  Yes Historical Provider, MD  potassium chloride SA (K-DUR,KLOR-CON) 20 MEQ tablet 1 tablet daily as needed.  10/24/13  Yes Historical Provider, MD  pramipexole (MIRAPEX) 1.5 MG tablet take 1 tablet three times a day 05/13/14  Yes Kathrynn Ducking, MD  SALINE MIST SPRAY NA Place into the nose as needed.   Yes Historical Provider, MD  simvastatin (ZOCOR) 40 MG tablet Take 40 mg by mouth at bedtime.  09/05/12  Yes  Historical Provider, MD  valsartan-hydrochlorothiazide (DIOVAN-HCT) 80-12.5 MG per tablet Take 1 tablet by mouth daily.   Yes Historical Provider, MD    ROS:  Out of a complete 14 system review of symptoms, the patient complains only of the following symptoms, and all other reviewed systems are negative.  Back pain Left arm tingling  Blood pressure 119/69, pulse 68, height 5\' 8"  (1.727 m), weight 237 lb (107.502 kg).  Physical Exam  General: The patient is alert and cooperative at the time of the examination. The patient is moderately obese.  Neuromuscular: With sitting and standing, the patient is leaning to the right, upper torso slightly rotated towards the right.  Skin: 2+ edema below the knees is noted bilaterally.   Neurologic Exam  Mental status: The patient is alert and oriented x 3 at the time of the examination. The patient has apparent normal recent and remote memory, with an apparently normal attention span and concentration ability.   Cranial nerves: Facial symmetry is present. Speech is normal, no aphasia or dysarthria is noted. Extraocular movements are full. Visual fields are full. Masking of the face is seen.  Motor: The patient has good strength in all 4 extremities.  Sensory examination: Soft touch sensation is symmetric on the face, arms, and legs.  Coordination: The patient has good finger-nose-finger and heel-to-shin bilaterally.  Gait and station: The patient has a stooped posture with walking, prominent resting tremor with the left arm. Decreased arm swing is noted, left greater than right. Tandem gait is slightly unsteady. Romberg is negative. No drift is seen.  Reflexes: Deep tendon reflexes are symmetric, but are depressed.   Assessment/Plan:  1. Parkinson's disease  2. Gait disorder  3. Chronic low back pain  The patient is having some difficulty getting going in the morning. He is on Sinemet CR taking the 50/200 mg tablets 4 times daily,  and Mirapex 1.5 mg 3 times daily. He will continue these medications. I will add short acting Sinemet 25/100 mg, one tablet in the morning. The patient will be placed on low-dose gabapentin taking 100 mg 3 times daily for the chronic low back pain. The patient is to remain active. He will follow-up in 4 months.  Jill Alexanders MD 10/20/2014 7:34 PM  [facility] [address] [place], [place] [address]  Phone [phone] Fax [phone]

## 2014-11-02 ENCOUNTER — Telehealth: Payer: Self-pay

## 2014-11-02 NOTE — Telephone Encounter (Signed)
error 

## 2015-02-18 ENCOUNTER — Other Ambulatory Visit: Payer: Self-pay | Admitting: Neurology

## 2015-02-26 ENCOUNTER — Other Ambulatory Visit: Payer: Self-pay | Admitting: Neurology

## 2015-03-10 ENCOUNTER — Other Ambulatory Visit: Payer: Self-pay | Admitting: Neurology

## 2015-03-24 ENCOUNTER — Ambulatory Visit (INDEPENDENT_AMBULATORY_CARE_PROVIDER_SITE_OTHER): Payer: PPO | Admitting: Neurology

## 2015-03-24 ENCOUNTER — Encounter: Payer: Self-pay | Admitting: Neurology

## 2015-03-24 VITALS — BP 118/68 | HR 58 | Ht 70.0 in | Wt 236.0 lb

## 2015-03-24 DIAGNOSIS — G8929 Other chronic pain: Secondary | ICD-10-CM | POA: Insufficient documentation

## 2015-03-24 DIAGNOSIS — M545 Low back pain, unspecified: Secondary | ICD-10-CM

## 2015-03-24 DIAGNOSIS — G20 Parkinson's disease: Secondary | ICD-10-CM | POA: Diagnosis not present

## 2015-03-24 HISTORY — DX: Low back pain, unspecified: M54.50

## 2015-03-24 HISTORY — DX: Other chronic pain: G89.29

## 2015-03-24 MED ORDER — PRAMIPEXOLE DIHYDROCHLORIDE 1.5 MG PO TABS: 1.5000 mg | ORAL_TABLET | Freq: Three times a day (TID) | ORAL | Status: DC

## 2015-03-24 MED ORDER — CARBIDOPA-LEVODOPA ER 50-200 MG PO TBCR: EXTENDED_RELEASE_TABLET | ORAL | Status: DC

## 2015-03-24 NOTE — Progress Notes (Signed)
Reason for visit: Parkinson's disease  Elijah [name] is an 75 y.o. male  History of present illness:  Elijah [name] is a 75 year old left-handed white male with a history of Parkinson's disease. He has been on the Sinemet CR tablets taking the 50/200 mg tablet 4 times daily. He is on Mirapex taking the 1.5 mg tablets 3 times daily. He has had some drowsiness on the medication. He was having some difficulty getting going in the morning, a short acting 25/100 mg Sinemet tablet was added in the morning, but this offered little benefit for him. He has drowsiness throughout the day. He has some difficulty with back pain, and some difficulty with pain into the right hip and groin area that occurs with standing. He will note that if he tries to walk or stand, he will lean forward, and to the right. Using a cane to help support him helps the back and hip discomfort. The patient indicates that Mobic also is effective in controlling the pain. He is on low-dose gabapentin taking 100 mg 3 times daily, it is not clear whether this is helping him or not. He does have some peripheral edema. He denies any recent falls, he denies issues swallowing or difficulty with memory. He returns to the office today for an evaluation.  Past Medical History  Diagnosis Date  . Parkinson disease ([place])   . Diabetes mellitus without complication ([place])   . Hypertension   . Gout 10/07/2012  . Obesity   . Degenerative arthritis   . Low back pain   . Dyslipidemia   . Glaucoma   . Rotator cuff tear     Bilateral, no surgery  . OSA (obstructive sleep apnea) 04/10/2014  . Chronic low back pain 03/24/2015    Past Surgical History  Procedure Laterality Date  . Appendectomy    . Hernia repair    . Tonsillectomy    . Shoulder bone spur Left     RESECTION    Family History  Problem Relation Age of Onset  . Stroke Mother   . Diabetes Mother   . Heart Problems Mother   . Parkinsonism Father   . Heart Problems Father      Social history:  reports that he has never smoked. He has never used smokeless tobacco. He reports that he does not drink alcohol or use illicit drugs.    Allergies  Allergen Reactions  . Aspirin   . Penicillins     Medications:  Prior to Admission medications   Medication Sig Start Date End Date Taking? Authorizing Provider  ACCU-CHEK AVIVA PLUS test strip  12/05/13  Yes Historical Provider, MD  allopurinol (ZYLOPRIM) 100 MG tablet Take 100 mg by mouth daily.  10/05/12  Yes Historical Provider, MD  aspirin 81 MG tablet Take 81 mg by mouth daily.   Yes Historical Provider, MD  carbidopa-levodopa (SINEMET CR) 50-200 MG tablet TAKE ONE TABLET FOUR TIMES A DAY 03/24/15  Yes Kathrynn Ducking, MD  cyclobenzaprine (FLEXERIL) 10 MG tablet Take 1 tablet (10 mg total) by mouth 2 (two) times daily as needed for muscle spasms. 07/02/12  Yes Teressa Lower, MD  furosemide (LASIX) 20 MG tablet 1 tablet 2 (two) times daily. 10/02/13  Yes Historical Provider, MD  gabapentin (NEURONTIN) 100 MG capsule Take 1 capsule (100 mg total) by mouth 3 (three) times daily. 10/20/14  Yes Kathrynn Ducking, MD  LORazepam (ATIVAN) 1 MG tablet Take 1 mg by mouth as needed for anxiety.  Yes Historical Provider, MD  meloxicam (MOBIC) 15 MG tablet Take 15 mg by mouth daily as needed.  09/30/12  Yes Historical Provider, MD  metFORMIN (GLUCOPHAGE) 500 MG tablet Take 500 mg by mouth 2 (two) times daily with a meal.  10/05/12  Yes Historical Provider, MD  metoprolol tartrate (LOPRESSOR) 25 MG tablet Take 25 mg by mouth daily. 1/2 TABLET DAILY   Yes Historical Provider, MD  pantoprazole (PROTONIX) 40 MG tablet Take 40 mg by mouth daily. 03/08/14  Yes Historical Provider, MD  potassium chloride SA (K-DUR,KLOR-CON) 20 MEQ tablet 1 tablet daily as needed.  10/24/13  Yes Historical Provider, MD  pramipexole (MIRAPEX) 1.5 MG tablet Take 1 tablet (1.5 mg total) by mouth 3 (three) times daily. 10/20/14  Yes Kathrynn Ducking, MD  SALINE MIST  SPRAY NA Place into the nose as needed.   Yes Historical Provider, MD  simvastatin (ZOCOR) 40 MG tablet Take 40 mg by mouth at bedtime.  09/05/12  Yes Historical Provider, MD  valsartan-hydrochlorothiazide (DIOVAN-HCT) 80-12.5 MG per tablet Take 1 tablet by mouth daily.   Yes Historical Provider, MD    ROS:  Out of a complete 14 system review of symptoms, the patient complains only of the following symptoms, and all other reviewed systems are negative.  Leg swelling Daytime sleepiness, snoring Joint pain, back pain Tremors   Blood pressure 118/68, pulse 58, height 5\' 10"  (1.778 m), weight 236 lb (107.049 kg).  Physical Exam  General: The patient is alert and cooperative at the time of the examination.The patient is moderately obese.  Neuromuscular: The patient has a stooped posture with standing, leans to the right with sitting and standing.  Skin: 2+ edema below the knees is noted bilaterally.   Neurologic Exam  Mental status: The patient is alert and oriented x 3 at the time of the examination. The patient has apparent normal recent and remote memory, with an apparently normal attention span and concentration ability.   Cranial nerves: Facial symmetry is present. Speech is normal, no aphasia or dysarthria is noted. Extraocular movements are full. Visual fields are full. Masking of the face is seen.  Motor: The patient has good strength in all 4 extremities.  Sensory examination: Soft touch sensation is symmetric on the face, arms, and legs.  Coordination: The patient has good finger-nose-finger and heel-to-shin bilaterally. Resting tremors are seen bilaterally with the upper extremities.  Gait and station: The patient is able to arise from a seated position with arms crossed. Once up, the patient is able to ambulate independently, usually uses a cane. He has stooped posture. Decreased arm swing seen bilaterally, prominent tremors are noted with walking.  Tandem gait is  slightly unsteady. Romberg is negative. No drift is seen.  Reflexes: Deep tendon reflexes are symmetric.   Assessment/Plan:  1. Parkinson's disease   2. Chronic back pain   The patient is having problems with posture which is affecting his back. The patient is quite stooped, with a tendency to lean to the right. The patient uses a back brace at times with some benefit. He will try Mobic on a regular basis to help the back pain. If needed, we can increase the gabapentin. The patient will stop the morning dose of the Sinemet 25/100 mg tablet. He will be given prescriptions for the Sinemet CR 50/200 mg tablet, and for the Mirapex. He will follow-up in 5 months.   Jill Alexanders MD 03/24/2015 6:11 PM  [facility] [address]  [place], [place] [address]  Phone [phone] Fax [phone]

## 2015-03-24 NOTE — Patient Instructions (Signed)

## 2015-04-19 DIAGNOSIS — E119 Type 2 diabetes mellitus without complications: Secondary | ICD-10-CM | POA: Diagnosis not present

## 2015-04-19 DIAGNOSIS — I10 Essential (primary) hypertension: Secondary | ICD-10-CM | POA: Diagnosis not present

## 2015-04-19 DIAGNOSIS — E782 Mixed hyperlipidemia: Secondary | ICD-10-CM | POA: Diagnosis not present

## 2015-04-21 DIAGNOSIS — R600 Localized edema: Secondary | ICD-10-CM | POA: Diagnosis not present

## 2015-04-21 DIAGNOSIS — I10 Essential (primary) hypertension: Secondary | ICD-10-CM | POA: Diagnosis not present

## 2015-04-21 DIAGNOSIS — G20 Parkinson's disease: Secondary | ICD-10-CM | POA: Diagnosis not present

## 2015-04-21 DIAGNOSIS — E782 Mixed hyperlipidemia: Secondary | ICD-10-CM | POA: Diagnosis not present

## 2015-04-21 DIAGNOSIS — E119 Type 2 diabetes mellitus without complications: Secondary | ICD-10-CM | POA: Diagnosis not present

## 2015-04-23 ENCOUNTER — Other Ambulatory Visit: Payer: Self-pay | Admitting: *Deleted

## 2015-04-23 MED ORDER — GABAPENTIN 100 MG PO CAPS: 100.0000 mg | ORAL_CAPSULE | Freq: Three times a day (TID) | ORAL | Status: DC

## 2015-04-23 NOTE — Telephone Encounter (Signed)
Gabapentin r/f per faxed request/fim

## 2015-08-25 ENCOUNTER — Ambulatory Visit: Payer: PPO | Admitting: Neurology

## 2015-08-29 ENCOUNTER — Encounter (HOSPITAL_COMMUNITY): Payer: Self-pay

## 2015-08-29 ENCOUNTER — Emergency Department (HOSPITAL_COMMUNITY)
Admission: EM | Admit: 2015-08-29 | Discharge: 2015-08-30 | Disposition: A | Discharge status: Home / Self Care | Payer: PPO | Attending: Emergency Medicine | Admitting: Emergency Medicine

## 2015-08-29 DIAGNOSIS — Z7984 Long term (current) use of oral hypoglycemic drugs: Secondary | ICD-10-CM | POA: Insufficient documentation

## 2015-08-29 DIAGNOSIS — E119 Type 2 diabetes mellitus without complications: Secondary | ICD-10-CM | POA: Diagnosis not present

## 2015-08-29 DIAGNOSIS — K5900 Constipation, unspecified: Secondary | ICD-10-CM | POA: Insufficient documentation

## 2015-08-29 DIAGNOSIS — Z7982 Long term (current) use of aspirin: Secondary | ICD-10-CM | POA: Diagnosis not present

## 2015-08-29 DIAGNOSIS — G20 Parkinson's disease: Secondary | ICD-10-CM | POA: Insufficient documentation

## 2015-08-29 DIAGNOSIS — R109 Unspecified abdominal pain: Secondary | ICD-10-CM

## 2015-08-29 DIAGNOSIS — I10 Essential (primary) hypertension: Secondary | ICD-10-CM | POA: Insufficient documentation

## 2015-08-29 DIAGNOSIS — Z79899 Other long term (current) drug therapy: Secondary | ICD-10-CM | POA: Insufficient documentation

## 2015-08-29 DIAGNOSIS — R1031 Right lower quadrant pain: Secondary | ICD-10-CM | POA: Diagnosis not present

## 2015-08-29 NOTE — ED Notes (Signed)
Pt states abdomainl pain after eating tonight. Pain has gotten better since it started. Pt denies N/V.

## 2015-08-29 NOTE — ED Notes (Signed)
Reports of eating dinner and had sudden onset of lower abdominal pain. Denies n/v/d.

## 2015-08-30 ENCOUNTER — Emergency Department (HOSPITAL_COMMUNITY): Payer: PPO

## 2015-08-30 DIAGNOSIS — K851 Biliary acute pancreatitis without necrosis or infection: Secondary | ICD-10-CM | POA: Diagnosis not present

## 2015-08-30 DIAGNOSIS — K838 Other specified diseases of biliary tract: Secondary | ICD-10-CM | POA: Diagnosis not present

## 2015-08-30 DIAGNOSIS — Z833 Family history of diabetes mellitus: Secondary | ICD-10-CM | POA: Diagnosis not present

## 2015-08-30 DIAGNOSIS — K831 Obstruction of bile duct: Secondary | ICD-10-CM | POA: Diagnosis not present

## 2015-08-30 DIAGNOSIS — G8929 Other chronic pain: Secondary | ICD-10-CM | POA: Diagnosis not present

## 2015-08-30 DIAGNOSIS — E119 Type 2 diabetes mellitus without complications: Secondary | ICD-10-CM | POA: Diagnosis not present

## 2015-08-30 DIAGNOSIS — Z820 Family history of epilepsy and other diseases of the nervous system: Secondary | ICD-10-CM | POA: Diagnosis not present

## 2015-08-30 DIAGNOSIS — K5750 Diverticulosis of both small and large intestine without perforation or abscess without bleeding: Secondary | ICD-10-CM | POA: Diagnosis not present

## 2015-08-30 DIAGNOSIS — R7989 Other specified abnormal findings of blood chemistry: Secondary | ICD-10-CM | POA: Diagnosis not present

## 2015-08-30 DIAGNOSIS — R17 Unspecified jaundice: Secondary | ICD-10-CM | POA: Diagnosis not present

## 2015-08-30 DIAGNOSIS — K859 Acute pancreatitis without necrosis or infection, unspecified: Secondary | ICD-10-CM | POA: Diagnosis not present

## 2015-08-30 DIAGNOSIS — K8050 Calculus of bile duct without cholangitis or cholecystitis without obstruction: Secondary | ICD-10-CM | POA: Diagnosis not present

## 2015-08-30 DIAGNOSIS — Z823 Family history of stroke: Secondary | ICD-10-CM | POA: Diagnosis not present

## 2015-08-30 DIAGNOSIS — R8569 Abnormal cytological findings in specimens from other digestive organs and abdominal cavity: Secondary | ICD-10-CM | POA: Diagnosis not present

## 2015-08-30 DIAGNOSIS — K5900 Constipation, unspecified: Secondary | ICD-10-CM | POA: Diagnosis not present

## 2015-08-30 DIAGNOSIS — R109 Unspecified abdominal pain: Secondary | ICD-10-CM | POA: Diagnosis not present

## 2015-08-30 DIAGNOSIS — Z7982 Long term (current) use of aspirin: Secondary | ICD-10-CM | POA: Diagnosis not present

## 2015-08-30 DIAGNOSIS — Z79899 Other long term (current) drug therapy: Secondary | ICD-10-CM | POA: Diagnosis not present

## 2015-08-30 DIAGNOSIS — I10 Essential (primary) hypertension: Secondary | ICD-10-CM | POA: Diagnosis not present

## 2015-08-30 DIAGNOSIS — H409 Unspecified glaucoma: Secondary | ICD-10-CM | POA: Diagnosis not present

## 2015-08-30 DIAGNOSIS — K4090 Unilateral inguinal hernia, without obstruction or gangrene, not specified as recurrent: Secondary | ICD-10-CM | POA: Diagnosis not present

## 2015-08-30 DIAGNOSIS — Z9889 Other specified postprocedural states: Secondary | ICD-10-CM | POA: Diagnosis not present

## 2015-08-30 DIAGNOSIS — K8051 Calculus of bile duct without cholangitis or cholecystitis with obstruction: Secondary | ICD-10-CM | POA: Diagnosis not present

## 2015-08-30 DIAGNOSIS — K8061 Calculus of gallbladder and bile duct with cholecystitis, unspecified, with obstruction: Secondary | ICD-10-CM | POA: Diagnosis not present

## 2015-08-30 DIAGNOSIS — K8011 Calculus of gallbladder with chronic cholecystitis with obstruction: Secondary | ICD-10-CM | POA: Diagnosis not present

## 2015-08-30 DIAGNOSIS — M1380 Other specified arthritis, unspecified site: Secondary | ICD-10-CM | POA: Diagnosis not present

## 2015-08-30 DIAGNOSIS — E785 Hyperlipidemia, unspecified: Secondary | ICD-10-CM | POA: Diagnosis not present

## 2015-08-30 DIAGNOSIS — M109 Gout, unspecified: Secondary | ICD-10-CM | POA: Diagnosis not present

## 2015-08-30 DIAGNOSIS — R0602 Shortness of breath: Secondary | ICD-10-CM | POA: Diagnosis not present

## 2015-08-30 DIAGNOSIS — K8010 Calculus of gallbladder with chronic cholecystitis without obstruction: Secondary | ICD-10-CM | POA: Diagnosis not present

## 2015-08-30 DIAGNOSIS — G20 Parkinson's disease: Secondary | ICD-10-CM | POA: Diagnosis not present

## 2015-08-30 DIAGNOSIS — G4733 Obstructive sleep apnea (adult) (pediatric): Secondary | ICD-10-CM | POA: Diagnosis not present

## 2015-08-30 DIAGNOSIS — Z880 Allergy status to penicillin: Secondary | ICD-10-CM | POA: Diagnosis not present

## 2015-08-30 DIAGNOSIS — F40240 Claustrophobia: Secondary | ICD-10-CM | POA: Diagnosis not present

## 2015-08-30 DIAGNOSIS — Z7984 Long term (current) use of oral hypoglycemic drugs: Secondary | ICD-10-CM | POA: Diagnosis not present

## 2015-08-30 DIAGNOSIS — M545 Low back pain: Secondary | ICD-10-CM | POA: Diagnosis not present

## 2015-08-30 LAB — URINALYSIS, ROUTINE W REFLEX MICROSCOPIC
Bilirubin Urine: NEGATIVE
Glucose, UA: NEGATIVE mg/dL
Hgb urine dipstick: NEGATIVE
KETONES UR: NEGATIVE mg/dL
LEUKOCYTES UA: NEGATIVE
NITRITE: NEGATIVE
PH: 5.5 (ref 5.0–8.0)
Protein, ur: NEGATIVE mg/dL
Specific Gravity, Urine: >1.03 — ABNORMAL HIGH (ref 1.005–1.030)

## 2015-08-30 LAB — CBC
HEMATOCRIT: 38.3 % — AB (ref 39.0–52.0)
Hemoglobin: 13.2 g/dL (ref 13.0–17.0)
MCH: 29.7 pg (ref 26.0–34.0)
MCHC: 34.5 g/dL (ref 30.0–36.0)
MCV: 86.1 fL (ref 78.0–100.0)
Platelets: 210 10*3/uL (ref 150–400)
RBC: 4.45 MIL/uL (ref 4.22–5.81)
RDW: 13.3 % (ref 11.5–15.5)
WBC: 7.6 10*3/uL (ref 4.0–10.5)

## 2015-08-30 LAB — COMPREHENSIVE METABOLIC PANEL
ALT: 31 U/L (ref 17–63)
AST: 70 U/L — ABNORMAL HIGH (ref 15–41)
Albumin: 4.2 g/dL (ref 3.5–5.0)
Alkaline Phosphatase: 121 U/L (ref 38–126)
Anion gap: 6 (ref 5–15)
BUN: 20 mg/dL (ref 6–20)
CO2: 25 mmol/L (ref 22–32)
Calcium: 8.6 mg/dL — ABNORMAL LOW (ref 8.9–10.3)
Chloride: 105 mmol/L (ref 101–111)
Creatinine, Ser: 1.04 mg/dL (ref 0.61–1.24)
GFR calc Af Amer: >60 mL/min (ref >60)
Glucose, Bld: 230 mg/dL — ABNORMAL HIGH (ref 65–99)
POTASSIUM: 3.7 mmol/L (ref 3.5–5.1)
Sodium: 136 mmol/L (ref 135–145)
Total Bilirubin: 1 mg/dL (ref 0.3–1.2)
Total Protein: 7 g/dL (ref 6.5–8.1)

## 2015-08-30 LAB — LIPASE, BLOOD: Lipase: 39 U/L (ref 11–51)

## 2015-08-30 NOTE — ED Provider Notes (Signed)
CSN: [id number]     Arrival date & time 08/29/15  2244 History  By signing my name below, I, Higinio Plan, attest that this documentation has been prepared under the direction and in the presence of Rolland Porter, MD at 12:45 AM . Electronically Signed: Higinio Plan, Scribe. 08/30/2015. 12:57 AM.  Chief Complaint  Patient presents with  . Abdominal Pain   The history is provided by the patient. No language interpreter was used.   HPI Comments: Elijah [name] is a 75 y.o. [name] with PMHx of Parkinson's disease, DM, and HTN, who presents to the Emergency Department complaining of gradually worsening, intermittent, RLQ abdominal pain that began at ~9:30pm this evening. Pt states his pain radiated from his RLQ to his RUQ. He notes his pain lasts for ~30-45 minutes at a time. He reports associated nausea and weakness. He states he has experienced similar symptoms before in which he had appendicitis; he reports PSHx of an appendectomy. Pt notes he still has his gallbladder. Pt notes he had 2 eggs, ham and toast for dinner; he states his pain began right after dinner. Pt states he normally eats this food, but not that late at night. He denies vomiting, diarrhea, difficulty urianting, and hematuria.He states his pain is better now.    PCP Dr Nevada Crane  Past Medical History  Diagnosis Date  . Parkinson disease ([place])   . Diabetes mellitus without complication ([place])   . Hypertension   . Gout 10/07/2012  . Obesity   . Degenerative arthritis   . Low back pain   . Dyslipidemia   . Glaucoma   . Rotator cuff tear     Bilateral, no surgery  . OSA (obstructive sleep apnea) 04/10/2014  . Chronic low back pain 03/24/2015   Past Surgical History  Procedure Laterality Date  . Appendectomy    . Hernia repair    . Tonsillectomy    . Shoulder bone spur Left     RESECTION   Family History  Problem Relation Age of Onset  . Stroke Mother   . Diabetes Mother   . Heart Problems Mother   . Parkinsonism Father   . Heart  Problems Father    Social History  Substance Use Topics  . Smoking status: Never Smoker   . Smokeless tobacco: Never Used  . Alcohol Use: No  lives at home  Lives with spouse  Review of Systems  Gastrointestinal: Positive for nausea and abdominal pain. Negative for vomiting and diarrhea.  Genitourinary: Negative for hematuria and difficulty urinating.  Neurological: Positive for weakness.  All other systems reviewed and are negative.  Allergies  Aspirin and Penicillins  Home Medications   Prior to Admission medications   Medication Sig Start Date End Date Taking? Authorizing Provider  ACCU-CHEK AVIVA PLUS test strip  12/05/13  Yes Historical Provider, MD  allopurinol (ZYLOPRIM) 100 MG tablet Take 100 mg by mouth daily.  10/05/12  Yes Historical Provider, MD  aspirin 81 MG tablet Take 81 mg by mouth daily.   Yes Historical Provider, MD  carbidopa-levodopa (SINEMET CR) 50-200 MG tablet TAKE ONE TABLET FOUR TIMES A DAY 03/24/15  Yes Kathrynn Ducking, MD  furosemide (LASIX) 20 MG tablet 1 tablet 2 (two) times daily. 10/02/13  Yes Historical Provider, MD  gabapentin (NEURONTIN) 100 MG capsule Take 1 capsule (100 mg total) by mouth 3 (three) times daily. 04/23/15  Yes Kathrynn Ducking, MD  HYDROcodone-acetaminophen (NORCO/VICODIN) 5-325 MG tablet Take 1 tablet by mouth every 6 (  six) hours as needed for moderate pain.   Yes Historical Provider, MD  LORazepam (ATIVAN) 1 MG tablet Take 1 mg by mouth as needed for anxiety.   Yes Historical Provider, MD  meloxicam (MOBIC) 15 MG tablet Take 15 mg by mouth daily as needed.  09/30/12  Yes Historical Provider, MD  metFORMIN (GLUCOPHAGE) 500 MG tablet Take 500 mg by mouth 2 (two) times daily with a meal.  10/05/12  Yes Historical Provider, MD  metoprolol tartrate (LOPRESSOR) 25 MG tablet Take 25 mg by mouth daily. 1/2 TABLET DAILY   Yes Historical Provider, MD  pantoprazole (PROTONIX) 40 MG tablet Take 40 mg by mouth daily. 03/08/14  Yes Historical  Provider, MD  potassium chloride SA (K-DUR,KLOR-CON) 20 MEQ tablet 1 tablet daily as needed.  10/24/13  Yes Historical Provider, MD  pramipexole (MIRAPEX) 1.5 MG tablet Take 1 tablet (1.5 mg total) by mouth 3 (three) times daily. 03/24/15  Yes Kathrynn Ducking, MD  SALINE MIST SPRAY NA Place into the nose as needed.   Yes Historical Provider, MD  simvastatin (ZOCOR) 40 MG tablet Take 40 mg by mouth at bedtime.  09/05/12  Yes Historical Provider, MD  valsartan-hydrochlorothiazide (DIOVAN-HCT) 80-12.5 MG per tablet Take 1 tablet by mouth daily.   Yes Historical Provider, MD  cyclobenzaprine (FLEXERIL) 10 MG tablet Take 1 tablet (10 mg total) by mouth 2 (two) times daily as needed for muscle spasms. 07/02/12   Teressa Lower, MD   BP 127/63 mmHg  Pulse 78  Temp(Src) 98.4 F (36.9 C) (Oral)  Resp 16  Ht 5\' 10"  (1.778 m)  Wt 230 lb (104.327 kg)  BMI 33.00 kg/m2  SpO2 98%  Vital signs normal   Physical Exam  Constitutional: He is oriented to person, place, and time. He appears well-developed and well-nourished.  Non-toxic appearance. He does not appear ill. No distress.  HENT:  Head: Normocephalic and atraumatic.  Right Ear: External ear normal.  Left Ear: External ear normal.  Nose: Nose normal. No mucosal edema or rhinorrhea.  Mouth/Throat: Oropharynx is clear and moist and mucous membranes are normal. No dental abscesses or uvula swelling.  Eyes: Conjunctivae and EOM are normal. Pupils are equal, round, and reactive to light.  Neck: Normal range of motion and full passive range of motion without pain. Neck supple.  Cardiovascular: Normal rate, regular rhythm and normal heart sounds.  Exam reveals no gallop and no friction rub.   No murmur heard. Pulmonary/Chest: Effort normal and breath sounds normal. No respiratory distress. He has no wheezes. He has no rhonchi. He has no rales. He exhibits no tenderness and no crepitus.  Abdominal: Soft. Normal appearance. He exhibits no distension. There is  no rebound and no guarding.  His abdomen was non-tender but he indicates his pain was in his RLQ and radiated up towards his RUQ  Musculoskeletal: Normal range of motion. He exhibits no edema or tenderness.  Moves all extremities well.   Neurological: He is alert and oriented to person, place, and time. He has normal strength. No cranial nerve deficit.  Skin: Skin is warm, dry and intact. No rash noted. No erythema. No pallor.  Psychiatric: He has a normal mood and affect. His speech is normal and behavior is normal. His mood appears not anxious.  Nursing note and vitals reviewed.   ED Course  Procedures    Medications - No data to display DIAGNOSTIC STUDIES:  Oxygen Saturation is 98% on RA, normal by my interpretation.  COORDINATION OF CARE:  12:56 AM Discussed his lab results and urinalysis and discussed treatment plan, which includes CT abdomen with pt at bedside and pt agreed to plan. Patient states his pain is improved and does not need any pain medicine at this time.  4 AM patient was rechecked. We discussed his CT results and actually reviewed his CT. He is noted to have a lot of stool in the right ascending colon which corresponds with his area pain. He states he takes MiraLAX but does not take it on a regular basis, he only takes it as needed. He states his stools have been hard recently. We discussed taking MiraLAX on a accelerated schedule today to get his constipation resolved and then take it on a more regular basis. He should return if he gets worse such as fever, vomiting, worsening abdominal pain.  Labs Review Results for orders placed or performed during the hospital encounter of 08/29/15  Lipase, blood  Result Value Ref Range   Lipase 39 11 - 51 U/L  Comprehensive metabolic panel  Result Value Ref Range   Sodium 136 135 - 145 mmol/L   Potassium 3.7 3.5 - 5.1 mmol/L   Chloride 105 101 - 111 mmol/L   CO2 25 22 - 32 mmol/L   Glucose, Bld 230 (H) 65 - 99 mg/dL    BUN 20 6 - 20 mg/dL   Creatinine, Ser 1.04 0.61 - 1.24 mg/dL   Calcium 8.6 (L) 8.9 - 10.3 mg/dL   Total Protein 7.0 6.5 - 8.1 g/dL   Albumin 4.2 3.5 - 5.0 g/dL   AST 70 (H) 15 - 41 U/L   ALT 31 17 - 63 U/L   Alkaline Phosphatase 121 38 - 126 U/L   Total Bilirubin 1.0 0.3 - 1.2 mg/dL   GFR calc non Af Amer >60 >60 mL/min   GFR calc Af Amer >60 >60 mL/min   Anion gap 6 5 - 15  CBC  Result Value Ref Range   WBC 7.6 4.0 - 10.5 K/uL   RBC 4.45 4.22 - 5.81 MIL/uL   Hemoglobin 13.2 13.0 - 17.0 g/dL   HCT 38.3 (L) 39.0 - 52.0 %   MCV 86.1 78.0 - 100.0 fL   MCH 29.7 26.0 - 34.0 pg   MCHC 34.5 30.0 - 36.0 g/dL   RDW 13.3 11.5 - 15.5 %   Platelets 210 150 - 400 K/uL  Urinalysis, Routine w reflex microscopic  Result Value Ref Range   Color, Urine YELLOW YELLOW   APPearance CLEAR CLEAR   Specific Gravity, Urine >1.030 (H) 1.005 - 1.030   pH 5.5 5.0 - 8.0   Glucose, UA NEGATIVE NEGATIVE mg/dL   Hgb urine dipstick NEGATIVE NEGATIVE   Bilirubin Urine NEGATIVE NEGATIVE   Ketones, ur NEGATIVE NEGATIVE mg/dL   Protein, ur NEGATIVE NEGATIVE mg/dL   Nitrite NEGATIVE NEGATIVE   Leukocytes, UA NEGATIVE NEGATIVE   Laboratory interpretation all normal except concentrated urine, hyperglycemia   Imaging Review Ct Renal Stone Study  08/30/2015  CLINICAL DATA:  75 year old [name] with right lower quadrant abdominal pain. EXAM: CT ABDOMEN AND PELVIS WITHOUT CONTRAST TECHNIQUE: Multidetector CT imaging of the abdomen and pelvis was performed following the standard protocol without IV contrast. COMPARISON:  CT dated 07/02/2012 FINDINGS: Evaluation of this exam is limited in the absence of intravenous contrast. Evaluation is also limited due to streak artifact caused by patient's arms. There is a partially visualized 4 mm nodular density in the right middle lobe (series  2, image 1). The visualized lung bases are otherwise clear. There is coronary vascular calcification. No intra-abdominal free air or free  fluid. The liver, gallbladder, pancreas, spleen, adrenal glands, kidneys, visualized ureters, and urinary bladder appear unremarkable. The prostate and seminal vesicles are grossly unremarkable. Constipation. Doppler sigmoid and scattered colonic diverticula without active inflammatory changes. There is no evidence of bowel obstruction or acute inflammation. Appendectomy. Mild aortoiliac atherosclerotic disease. No portal venous gas identified. There is no adenopathy. There is a midline vertical anterior pelvic wall incisional scar. There is a small fat containing left inguinal hernia. There is a small right inguinal hernia. There is herniation of a portion of the bladder into the right inguinal canal. There is degenerative changes of the spine. No acute fracture. IMPRESSION: No hydronephrosis or nephrolithiasis. Constipation.  No bowel obstruction or active inflammation. Diverticulosis. Electronically Signed   By: Anner Crete M.D.   On: 08/30/2015 03:18   I have personally reviewed and evaluated these images and lab results as part of my medical decision-making.    MDM   Final diagnoses:  Right sided abdominal pain  Constipation, unspecified constipation type   meds  OTC miralax  Plan discharge  Rolland Porter, MD, FACEP   I personally performed the services described in this documentation, which was scribed in my presence. The recorded information has been reviewed and considered.  Rolland Porter, MD, Barbette Or, MD 08/30/15 [phone]

## 2015-08-30 NOTE — Discharge Instructions (Signed)
Get miralax and put one dose or 17 g in 8 ounces of water,  take 1 dose every 30 minutes for 2-3 hours or until you  get good results and then once or twice daily to prevent constipation.  Recheck if you get vomiting, fever, worsening abdominal pain.     Constipation, Adult Constipation is when a person has fewer than three bowel movements a week, has difficulty having a bowel movement, or has stools that are dry, hard, or larger than normal. As people grow older, constipation is more common. A low-fiber diet, not taking in enough fluids, and taking certain medicines may make constipation worse.  CAUSES   Certain medicines, such as antidepressants, pain medicine, iron supplements, antacids, and water pills.   Certain diseases, such as diabetes, irritable bowel syndrome (IBS), thyroid disease, or depression.   Not drinking enough water.   Not eating enough fiber-rich foods.   Stress or travel.   Lack of physical activity or exercise.   Ignoring the urge to have a bowel movement.   Using laxatives too much.  SIGNS AND SYMPTOMS   Having fewer than three bowel movements a week.   Straining to have a bowel movement.   Having stools that are hard, dry, or larger than normal.   Feeling full or bloated.   Pain in the lower abdomen.   Not feeling relief after having a bowel movement.  DIAGNOSIS  Your health care provider will take a medical history and perform a physical exam. Further testing may be done for severe constipation. Some tests may include:  A barium enema X-ray to examine your rectum, colon, and, sometimes, your small intestine.   A sigmoidoscopy to examine your lower colon.   A colonoscopy to examine your entire colon. TREATMENT  Treatment will depend on the severity of your constipation and what is causing it. Some dietary treatments include drinking more fluids and eating more fiber-rich foods. Lifestyle treatments may include regular exercise. If  these diet and lifestyle recommendations do not help, your health care provider may recommend taking over-the-counter laxative medicines to help you have bowel movements. Prescription medicines may be prescribed if over-the-counter medicines do not work.  HOME CARE INSTRUCTIONS   Eat foods that have a lot of fiber, such as fruits, vegetables, whole grains, and beans.  Limit foods high in fat and processed sugars, such as french fries, hamburgers, cookies, candies, and soda.   A fiber supplement may be added to your diet if you cannot get enough fiber from foods.   Drink enough fluids to keep your urine clear or pale yellow.   Exercise regularly or as directed by your health care provider.   Go to the restroom when you have the urge to go. Do not hold it.   Only take over-the-counter or prescription medicines as directed by your health care provider. Do not take other medicines for constipation without talking to your health care provider first.  [place] IF:   You have bright red blood in your stool.   Your constipation lasts for more than 4 days or gets worse.   You have abdominal or rectal pain.   You have thin, pencil-like stools.   You have unexplained weight loss. MAKE SURE YOU:   Understand these instructions.  Will watch your condition.  Will get help right away if you are not doing well or get worse.   This information is not intended to replace advice given to you by your health  care provider. Make sure you discuss any questions you have with your health care provider.   Document Released: 11/05/2003 Document Revised: 02/27/2014 Document Reviewed: 11/18/2012 Elsevier Interactive Patient Education [employer].

## 2015-08-30 NOTE — ED Notes (Signed)
Pt alert & oriented x4, stable gait. Patient given discharge instructions, paperwork & prescription(s).  Patient verbalized understanding. Pt left department in wheelchair. Pt left  w/ no further questions. 

## 2015-09-01 ENCOUNTER — Telehealth: Payer: Self-pay | Admitting: Neurology

## 2015-09-01 DIAGNOSIS — M545 Low back pain: Secondary | ICD-10-CM | POA: Diagnosis not present

## 2015-09-01 NOTE — Telephone Encounter (Signed)
Pt's wife called request refill for carbidopa-levodopa 25-100 called to [facility]. He's been out for about 2 weeks, sts they have called the pharmacy several times and have been told they haven't heard from [place] and was advised today to call and ask for RX.

## 2015-09-01 NOTE — Telephone Encounter (Signed)
Per Dr. Jannifer Franklin' last OV note, pt was to "stop the morning dose of the Sinemet 25/100 mg tablet" and was "given prescriptions for the Sinemet CR 50/200 mg tablet, and for the Mirapex." Returned call and reviewed w/ pt/wife. Says that he is taking Sinemet CR 50/200 mg QID and Mirpex TID as prescribed. Instructed to stop Sinemet 25/100 mg per MD. Verbalized understanding and appreciation for call. Plans to keep follow-up appt at the end of next month.

## 2015-09-02 ENCOUNTER — Inpatient Hospital Stay (HOSPITAL_COMMUNITY)
Admission: EM | Admit: 2015-09-02 | Discharge: 2015-09-09 | DRG: 439 | Disposition: A | Discharge status: Home / Self Care | Payer: PPO | Attending: Internal Medicine | Admitting: Internal Medicine

## 2015-09-02 ENCOUNTER — Emergency Department (HOSPITAL_COMMUNITY): Payer: PPO

## 2015-09-02 ENCOUNTER — Encounter (HOSPITAL_COMMUNITY): Payer: Self-pay | Admitting: Cardiology

## 2015-09-02 DIAGNOSIS — Z833 Family history of diabetes mellitus: Secondary | ICD-10-CM | POA: Diagnosis not present

## 2015-09-02 DIAGNOSIS — K8011 Calculus of gallbladder with chronic cholecystitis with obstruction: Secondary | ICD-10-CM | POA: Diagnosis not present

## 2015-09-02 DIAGNOSIS — K8061 Calculus of gallbladder and bile duct with cholecystitis, unspecified, with obstruction: Secondary | ICD-10-CM | POA: Diagnosis present

## 2015-09-02 DIAGNOSIS — K831 Obstruction of bile duct: Secondary | ICD-10-CM | POA: Diagnosis not present

## 2015-09-02 DIAGNOSIS — M109 Gout, unspecified: Secondary | ICD-10-CM | POA: Diagnosis not present

## 2015-09-02 DIAGNOSIS — G4733 Obstructive sleep apnea (adult) (pediatric): Secondary | ICD-10-CM | POA: Diagnosis not present

## 2015-09-02 DIAGNOSIS — R0602 Shortness of breath: Secondary | ICD-10-CM | POA: Diagnosis not present

## 2015-09-02 DIAGNOSIS — Z7982 Long term (current) use of aspirin: Secondary | ICD-10-CM

## 2015-09-02 DIAGNOSIS — M545 Low back pain, unspecified: Secondary | ICD-10-CM | POA: Diagnosis present

## 2015-09-02 DIAGNOSIS — R8569 Abnormal cytological findings in specimens from other digestive organs and abdominal cavity: Secondary | ICD-10-CM | POA: Diagnosis not present

## 2015-09-02 DIAGNOSIS — F40240 Claustrophobia: Secondary | ICD-10-CM | POA: Diagnosis not present

## 2015-09-02 DIAGNOSIS — K5750 Diverticulosis of both small and large intestine without perforation or abscess without bleeding: Secondary | ICD-10-CM | POA: Diagnosis present

## 2015-09-02 DIAGNOSIS — E119 Type 2 diabetes mellitus without complications: Secondary | ICD-10-CM

## 2015-09-02 DIAGNOSIS — K838 Other specified diseases of biliary tract: Secondary | ICD-10-CM | POA: Diagnosis not present

## 2015-09-02 DIAGNOSIS — G20 Parkinson's disease: Secondary | ICD-10-CM | POA: Diagnosis not present

## 2015-09-02 DIAGNOSIS — Z880 Allergy status to penicillin: Secondary | ICD-10-CM

## 2015-09-02 DIAGNOSIS — K8050 Calculus of bile duct without cholangitis or cholecystitis without obstruction: Secondary | ICD-10-CM | POA: Diagnosis not present

## 2015-09-02 DIAGNOSIS — K859 Acute pancreatitis without necrosis or infection, unspecified: Secondary | ICD-10-CM | POA: Diagnosis present

## 2015-09-02 DIAGNOSIS — K851 Biliary acute pancreatitis without necrosis or infection: Secondary | ICD-10-CM | POA: Diagnosis not present

## 2015-09-02 DIAGNOSIS — H409 Unspecified glaucoma: Secondary | ICD-10-CM | POA: Diagnosis present

## 2015-09-02 DIAGNOSIS — K4090 Unilateral inguinal hernia, without obstruction or gangrene, not specified as recurrent: Secondary | ICD-10-CM | POA: Diagnosis not present

## 2015-09-02 DIAGNOSIS — K8051 Calculus of bile duct without cholangitis or cholecystitis with obstruction: Secondary | ICD-10-CM | POA: Diagnosis not present

## 2015-09-02 DIAGNOSIS — G8929 Other chronic pain: Secondary | ICD-10-CM | POA: Diagnosis present

## 2015-09-02 DIAGNOSIS — M1380 Other specified arthritis, unspecified site: Secondary | ICD-10-CM | POA: Diagnosis not present

## 2015-09-02 DIAGNOSIS — Z79899 Other long term (current) drug therapy: Secondary | ICD-10-CM | POA: Diagnosis not present

## 2015-09-02 DIAGNOSIS — Z820 Family history of epilepsy and other diseases of the nervous system: Secondary | ICD-10-CM | POA: Diagnosis not present

## 2015-09-02 DIAGNOSIS — R7989 Other specified abnormal findings of blood chemistry: Secondary | ICD-10-CM | POA: Diagnosis not present

## 2015-09-02 DIAGNOSIS — R945 Abnormal results of liver function studies: Secondary | ICD-10-CM

## 2015-09-02 DIAGNOSIS — E785 Hyperlipidemia, unspecified: Secondary | ICD-10-CM | POA: Diagnosis not present

## 2015-09-02 DIAGNOSIS — R17 Unspecified jaundice: Secondary | ICD-10-CM | POA: Diagnosis not present

## 2015-09-02 DIAGNOSIS — T801XXA Vascular complications following infusion, transfusion and therapeutic injection, initial encounter: Secondary | ICD-10-CM

## 2015-09-02 DIAGNOSIS — R109 Unspecified abdominal pain: Secondary | ICD-10-CM | POA: Diagnosis not present

## 2015-09-02 DIAGNOSIS — G20A1 Parkinson's disease without dyskinesia, without mention of fluctuations: Secondary | ICD-10-CM | POA: Diagnosis present

## 2015-09-02 DIAGNOSIS — Z7984 Long term (current) use of oral hypoglycemic drugs: Secondary | ICD-10-CM | POA: Diagnosis not present

## 2015-09-02 DIAGNOSIS — I10 Essential (primary) hypertension: Secondary | ICD-10-CM | POA: Diagnosis not present

## 2015-09-02 DIAGNOSIS — K8010 Calculus of gallbladder with chronic cholecystitis without obstruction: Secondary | ICD-10-CM | POA: Diagnosis not present

## 2015-09-02 DIAGNOSIS — Z823 Family history of stroke: Secondary | ICD-10-CM | POA: Diagnosis not present

## 2015-09-02 DIAGNOSIS — Z9889 Other specified postprocedural states: Secondary | ICD-10-CM | POA: Diagnosis not present

## 2015-09-02 DIAGNOSIS — K8020 Calculus of gallbladder without cholecystitis without obstruction: Secondary | ICD-10-CM | POA: Insufficient documentation

## 2015-09-02 DIAGNOSIS — K5900 Constipation, unspecified: Secondary | ICD-10-CM | POA: Diagnosis present

## 2015-09-02 LAB — COMPREHENSIVE METABOLIC PANEL
ALBUMIN: 3.6 g/dL (ref 3.5–5.0)
ALK PHOS: 168 U/L — AB (ref 38–126)
ALT: 15 U/L — AB (ref 17–63)
AST: 55 U/L — ABNORMAL HIGH (ref 15–41)
Anion gap: 7 (ref 5–15)
BILIRUBIN TOTAL: 5.7 mg/dL — AB (ref 0.3–1.2)
BUN: 19 mg/dL (ref 6–20)
CALCIUM: 8.5 mg/dL — AB (ref 8.9–10.3)
CO2: 24 mmol/L (ref 22–32)
CREATININE: 0.88 mg/dL (ref 0.61–1.24)
Chloride: 101 mmol/L (ref 101–111)
GFR calc Af Amer: >60 mL/min (ref >60)
GFR calc non Af Amer: >60 mL/min (ref >60)
GLUCOSE: 171 mg/dL — AB (ref 65–99)
Potassium: 3.8 mmol/L (ref 3.5–5.1)
SODIUM: 132 mmol/L — AB (ref 135–145)
Total Protein: 6.8 g/dL (ref 6.5–8.1)

## 2015-09-02 LAB — URINALYSIS, ROUTINE W REFLEX MICROSCOPIC
Glucose, UA: NEGATIVE mg/dL
Hgb urine dipstick: NEGATIVE
Ketones, ur: NEGATIVE mg/dL
LEUKOCYTES UA: NEGATIVE
Nitrite: NEGATIVE
PH: 6.5 (ref 5.0–8.0)
Protein, ur: NEGATIVE mg/dL

## 2015-09-02 LAB — CBC WITH DIFFERENTIAL/PLATELET
BASOS PCT: 0 %
Basophils Absolute: 0 10*3/uL (ref 0.0–0.1)
Eosinophils Absolute: 0.1 10*3/uL (ref 0.0–0.7)
Eosinophils Relative: 1 %
HCT: 35.7 % — ABNORMAL LOW (ref 39.0–52.0)
HEMOGLOBIN: 12.5 g/dL — AB (ref 13.0–17.0)
LYMPHS ABS: 0.3 10*3/uL — AB (ref 0.7–4.0)
Lymphocytes Relative: 4 %
MCH: 29.6 pg (ref 26.0–34.0)
MCHC: 35 g/dL (ref 30.0–36.0)
MCV: 84.6 fL (ref 78.0–100.0)
MONOS PCT: 8 %
Monocytes Absolute: 0.8 10*3/uL (ref 0.1–1.0)
NEUTROS ABS: 7.9 10*3/uL — AB (ref 1.7–7.7)
NEUTROS PCT: 87 %
Platelets: 189 10*3/uL (ref 150–400)
RBC: 4.22 MIL/uL (ref 4.22–5.81)
RDW: 13.4 % (ref 11.5–15.5)
WBC: 9.1 10*3/uL (ref 4.0–10.5)

## 2015-09-02 LAB — LIPASE, BLOOD: Lipase: 383 U/L — ABNORMAL HIGH (ref 11–51)

## 2015-09-02 LAB — GLUCOSE, CAPILLARY: Glucose-Capillary: 130 mg/dL — ABNORMAL HIGH (ref 65–99)

## 2015-09-02 LAB — I-STAT CG4 LACTIC ACID, ED: Lactic Acid, Venous: 1.73 mmol/L (ref 0.5–1.9)

## 2015-09-02 MED ORDER — SODIUM CHLORIDE 0.9 % IV SOLN
INTRAVENOUS | Status: DC
  Administered 2015-09-03 – 2015-09-05 (×5): via INTRAVENOUS

## 2015-09-02 MED ORDER — IOPAMIDOL (ISOVUE-300) INJECTION 61%
100.0000 mL | Freq: Once | INTRAVENOUS | Status: AC | PRN
  Administered 2015-09-02: 100 mL via INTRAVENOUS

## 2015-09-02 MED ORDER — ACETAMINOPHEN 500 MG PO TABS
1000.0000 mg | ORAL_TABLET | Freq: Once | ORAL | Status: AC
  Administered 2015-09-02: 1000 mg via ORAL
  Filled 2015-09-02: qty 2

## 2015-09-02 MED ORDER — SODIUM CHLORIDE 0.9 % IV SOLN: INTRAVENOUS | Status: DC

## 2015-09-02 MED ORDER — SODIUM CHLORIDE 0.9 % IV BOLUS (SEPSIS)
500.0000 mL | Freq: Once | INTRAVENOUS | Status: AC
  Administered 2015-09-02: 500 mL via INTRAVENOUS

## 2015-09-02 MED ORDER — ONDANSETRON HCL 4 MG PO TABS: 4.0000 mg | ORAL_TABLET | Freq: Four times a day (QID) | ORAL | Status: DC | PRN

## 2015-09-02 MED ORDER — DIATRIZOATE MEGLUMINE & SODIUM 66-10 % PO SOLN
ORAL | Status: AC
  Filled 2015-09-02: qty 30

## 2015-09-02 MED ORDER — SODIUM CHLORIDE 0.9 % IV SOLN
INTRAVENOUS | Status: DC
  Administered 2015-09-02: 18:00:00 via INTRAVENOUS

## 2015-09-02 MED ORDER — METRONIDAZOLE IN NACL 5-0.79 MG/ML-% IV SOLN
500.0000 mg | Freq: Once | INTRAVENOUS | Status: AC
  Administered 2015-09-02: 500 mg via INTRAVENOUS
  Filled 2015-09-02: qty 100

## 2015-09-02 MED ORDER — HYDROMORPHONE HCL 1 MG/ML IJ SOLN
1.0000 mg | INTRAMUSCULAR | Status: DC | PRN
  Administered 2015-09-03 – 2015-09-06 (×4): 1 mg via INTRAVENOUS
  Filled 2015-09-02 (×5): qty 1

## 2015-09-02 MED ORDER — LEVOFLOXACIN IN D5W 750 MG/150ML IV SOLN
750.0000 mg | INTRAVENOUS | Status: DC
  Administered 2015-09-03: 750 mg via INTRAVENOUS
  Filled 2015-09-02: qty 150

## 2015-09-02 MED ORDER — ONDANSETRON HCL 4 MG/2ML IJ SOLN
4.0000 mg | Freq: Four times a day (QID) | INTRAMUSCULAR | Status: DC | PRN
  Administered 2015-09-05 (×2): 4 mg via INTRAVENOUS
  Filled 2015-09-02 (×2): qty 2

## 2015-09-02 MED ORDER — POTASSIUM CHLORIDE 2 MEQ/ML IV SOLN
INTRAVENOUS | Status: DC
  Filled 2015-09-02 (×2): qty 1000

## 2015-09-02 MED ORDER — LEVOFLOXACIN IN D5W 750 MG/150ML IV SOLN
750.0000 mg | Freq: Once | INTRAVENOUS | Status: AC
  Administered 2015-09-02: 750 mg via INTRAVENOUS
  Filled 2015-09-02: qty 150

## 2015-09-02 MED ORDER — METRONIDAZOLE IN NACL 5-0.79 MG/ML-% IV SOLN
500.0000 mg | Freq: Three times a day (TID) | INTRAVENOUS | Status: DC
  Administered 2015-09-03 – 2015-09-04 (×4): 500 mg via INTRAVENOUS
  Filled 2015-09-02 (×3): qty 100

## 2015-09-02 MED ORDER — INSULIN ASPART 100 UNIT/ML ~~LOC~~ SOLN
0.0000 [IU] | Freq: Three times a day (TID) | SUBCUTANEOUS | Status: DC
  Administered 2015-09-04 (×2): 1 [IU] via SUBCUTANEOUS
  Administered 2015-09-04 – 2015-09-05 (×2): 2 [IU] via SUBCUTANEOUS
  Administered 2015-09-05 (×2): 1 [IU] via SUBCUTANEOUS
  Administered 2015-09-07: 2 [IU] via SUBCUTANEOUS
  Administered 2015-09-08 (×2): 1 [IU] via SUBCUTANEOUS
  Administered 2015-09-09: 2 [IU] via SUBCUTANEOUS
  Administered 2015-09-09: 1 [IU] via SUBCUTANEOUS

## 2015-09-02 MED ORDER — ONDANSETRON HCL 4 MG/2ML IJ SOLN
4.0000 mg | Freq: Once | INTRAMUSCULAR | Status: AC
  Administered 2015-09-02: 4 mg via INTRAVENOUS
  Filled 2015-09-02: qty 2

## 2015-09-02 NOTE — ED Notes (Signed)
Report given to floor. Pt ready for transfer. 

## 2015-09-02 NOTE — ED Provider Notes (Addendum)
CSN: [id number]     Arrival date & time 09/02/15  1645 History   First MD Initiated Contact with Patient 09/02/15 1658     Chief Complaint  Patient presents with  . Abdominal Pain     (Consider location/radiation/quality/duration/timing/severity/associated sxs/prior Treatment) Patient is a 75 y.o. male presenting with abdominal pain. The history is provided by the patient and the spouse.  Abdominal Pain Associated symptoms: chills, fatigue, fever, nausea and vomiting   Associated symptoms: no chest pain, no diarrhea, no dysuria and no shortness of breath   The patient seen for same complaint on July 10. Had CT renal scan done without any abnormalities. Labs without any abnormalities. Symptoms have persisted with pain to the right side of the abdomen but today vomited and also had fever and chills. Pain is still right lower quadrant patient denies any pain to the back. Does admit to persistent nausea. No diarrhea.  Past Medical History  Diagnosis Date  . Parkinson disease ([place])   . Diabetes mellitus without complication ([place])   . Hypertension   . Gout 10/07/2012  . Obesity   . Degenerative arthritis   . Low back pain   . Dyslipidemia   . Glaucoma   . Rotator cuff tear     Bilateral, no surgery  . OSA (obstructive sleep apnea) 04/10/2014  . Chronic low back pain 03/24/2015   Past Surgical History  Procedure Laterality Date  . Appendectomy    . Hernia repair    . Tonsillectomy    . Shoulder bone spur Left     RESECTION   Family History  Problem Relation Age of Onset  . Stroke Mother   . Diabetes Mother   . Heart Problems Mother   . Parkinsonism Father   . Heart Problems Father    Social History  Substance Use Topics  . Smoking status: Never Smoker   . Smokeless tobacco: Never Used  . Alcohol Use: No    Review of Systems  Constitutional: Positive for fever, chills and fatigue.  HENT: Negative for congestion.   Eyes: Negative for visual disturbance.  Respiratory:  Negative for shortness of breath.   Cardiovascular: Negative for chest pain.  Gastrointestinal: Positive for nausea, vomiting and abdominal pain. Negative for diarrhea.  Genitourinary: Negative for dysuria.  Musculoskeletal: Negative for back pain.  Skin: Negative for rash.  Neurological: Negative for headaches.  Psychiatric/Behavioral: Negative for confusion.      Allergies  Aspirin and Penicillins  Home Medications   Prior to Admission medications   Medication Sig Start Date End Date Taking? Authorizing Provider  allopurinol (ZYLOPRIM) 100 MG tablet Take 100 mg by mouth daily.  10/05/12  Yes Historical Provider, MD  aspirin 81 MG tablet Take 81 mg by mouth daily.   Yes Historical Provider, MD  Azelastine HCl 0.15 % SOLN Place 1 spray into the nose daily as needed. 08/18/15  Yes Historical Provider, MD  carbidopa-levodopa (SINEMET CR) 50-200 MG tablet TAKE ONE TABLET FOUR TIMES A DAY 03/24/15  Yes Kathrynn Ducking, MD  cyclobenzaprine (FLEXERIL) 10 MG tablet Take 1 tablet (10 mg total) by mouth 2 (two) times daily as needed for muscle spasms. 07/02/12  Yes Teressa Lower, MD  furosemide (LASIX) 20 MG tablet Take 1 tablet by mouth 2 (two) times daily as needed for fluid or edema.  10/02/13  Yes Historical Provider, MD  gabapentin (NEURONTIN) 100 MG capsule Take 1 capsule (100 mg total) by mouth 3 (three) times daily. 04/23/15  Yes Charles  Loreta Ave, MD  LORazepam (ATIVAN) 1 MG tablet Take 1 mg by mouth as needed for anxiety.   Yes Historical Provider, MD  meloxicam (MOBIC) 15 MG tablet Take 15 mg by mouth daily as needed for pain.  09/30/12  Yes Historical Provider, MD  metFORMIN (GLUCOPHAGE) 500 MG tablet Take 500 mg by mouth 2 (two) times daily with a meal.  10/05/12  Yes Historical Provider, MD  metoprolol tartrate (LOPRESSOR) 25 MG tablet Take 12.5 mg by mouth daily. 1/2 TABLET DAILY   Yes Historical Provider, MD  pantoprazole (PROTONIX) 40 MG tablet Take 40 mg by mouth daily. 03/08/14  Yes  Historical Provider, MD  potassium chloride SA (K-DUR,KLOR-CON) 20 MEQ tablet Take 1 tablet by mouth daily.  10/24/13  Yes Historical Provider, MD  pramipexole (MIRAPEX) 1.5 MG tablet Take 1 tablet (1.5 mg total) by mouth 3 (three) times daily. 03/24/15  Yes Kathrynn Ducking, MD  SALINE MIST SPRAY NA Place 1 spray into the nose as needed.    Yes Historical Provider, MD  simvastatin (ZOCOR) 40 MG tablet Take 20 mg by mouth at bedtime.  09/05/12  Yes Historical Provider, MD  valsartan-hydrochlorothiazide (DIOVAN-HCT) 80-12.5 MG per tablet Take 1 tablet by mouth daily.   Yes Historical Provider, MD  ACCU-CHEK AVIVA PLUS test strip  12/05/13   Historical Provider, MD   BP 98/57 mmHg  Pulse 78  Temp(Src) 98.8 F (37.1 C) (Oral)  Resp 16  Ht 5\' 10"  (1.778 m)  Wt 104.327 kg  BMI 33.00 kg/m2  SpO2 96% Physical Exam  Constitutional: He is oriented to person, place, and time. He appears well-developed and well-nourished. No distress.  HENT:  Head: Normocephalic and atraumatic.  Mouth/Throat: Oropharynx is clear and moist.  Eyes: Conjunctivae and EOM are normal. Pupils are equal, round, and reactive to light.  Neck: Normal range of motion. Neck supple.  Cardiovascular: Normal rate, regular rhythm and normal heart sounds.   No murmur heard. Pulmonary/Chest: Effort normal and breath sounds normal. No respiratory distress.  Abdominal: Soft. Bowel sounds are normal. There is tenderness.  Mild tenderness to palpation right lower quadrant no masses. No obvious inguinal hernia. Well-healed surgical scar on the abdomen.  Musculoskeletal: Normal range of motion.  Neurological: He is alert and oriented to person, place, and time. No cranial nerve deficit. He exhibits normal muscle tone. Coordination normal.  Skin: Skin is warm and dry. No rash noted.  Nursing note and vitals reviewed.   ED Course  Procedures (including critical care time) Labs Review Labs Reviewed  CBC WITH DIFFERENTIAL/PLATELET -  Abnormal; Notable for the following:    Hemoglobin 12.5 (*)    HCT 35.7 (*)    Neutro Abs 7.9 (*)    Lymphs Abs 0.3 (*)    All other components within normal limits  COMPREHENSIVE METABOLIC PANEL - Abnormal; Notable for the following:    Sodium 132 (*)    Glucose, Bld 171 (*)    Calcium 8.5 (*)    AST 55 (*)    ALT 15 (*)    Alkaline Phosphatase 168 (*)    Total Bilirubin 5.7 (*)    All other components within normal limits  LIPASE, BLOOD - Abnormal; Notable for the following:    Lipase 383 (*)    All other components within normal limits  CULTURE, BLOOD (ROUTINE X 2)  CULTURE, BLOOD (ROUTINE X 2)  URINALYSIS, ROUTINE W REFLEX MICROSCOPIC (NOT AT [facility])  I-STAT CG4 LACTIC ACID, ED  Imaging Review Dg Chest 2 View  09/02/2015  CLINICAL DATA:  Chronic 1 year history of shortness of breath. EXAM: CHEST  2 VIEW COMPARISON:  07/11/2004. FINDINGS: Lateral image is suboptimal as the patient was unable to raise the arms. Patient is rotated to the right on the PA image. Cardiac silhouette mildly enlarged. Thoracic aorta mildly tortuous. Hilar and mediastinal contours otherwise unremarkable. Linear atelectasis or scarring in the right lower lobe. Lungs otherwise clear. No localized airspace consolidation. No pleural effusions. No pneumothorax. Normal pulmonary vascularity. IMPRESSION: Mild cardiomegaly. Linear atelectasis or scarring in the right lower lobe. No acute cardiopulmonary disease otherwise. Electronically Signed   By: Evangeline Dakin M.D.   On: 09/02/2015 19:12   Ct Abdomen Pelvis W Contrast  09/02/2015  CLINICAL DATA:  Right lower quadrant pain with nausea and vomiting for 3 days. Previous appendectomy. EXAM: CT ABDOMEN AND PELVIS WITH CONTRAST TECHNIQUE: Multidetector CT imaging of the abdomen and pelvis was performed using the standard protocol following bolus administration of intravenous contrast. CONTRAST:  184mL ISOVUE-300 IOPAMIDOL (ISOVUE-300) INJECTION 61% COMPARISON:   08/30/2015 FINDINGS: Lower chest:  No acute findings. Hepatobiliary: No masses or other significant abnormality. Gallbladder is unremarkable. Pancreas: Increased prominence of pancreatic head and uncinate process seen with mild hazy density in adjacent peripancreatic fat. This raises suspicion for focal pancreatitis. There is no evidence of solid pancreatic mass or pancreatic ductal dilatation. No evidence of pancreatic necrosis or peripancreatic fluid collections. Spleen: Within normal limits in size and appearance. Adrenals/Urinary Tract: No masses identified. Several tiny left renal cysts noted. No evidence of hydronephrosis. Stomach/Bowel: No evidence of obstruction, inflammatory process, or abnormal fluid collections. Vascular/Lymphatic: No pathologically enlarged lymph nodes. No evidence of abdominal aortic aneurysm. Aortic atherosclerosis noted. Reproductive: No mass or other significant abnormality. Other: Small right inguinal hernia again seen containing a small portion of the urinary bladder. No evidence of herniated bowel loops. Musculoskeletal:  No suspicious bone lesions identified. IMPRESSION: Increased prominence of pancreatic head and uncinate process with hazy density in adjacent fat, suspicious for mild focal pancreatitis. Recommend correlation with serum amylase and lipase levels. Stable small right inguinal hernia containing a small portion of the urinary bladder. Aortic atherosclerosis noted. Electronically Signed   By: Earle Gell M.D.   On: 09/02/2015 19:17   I have personally reviewed and evaluated these images and lab results as part of my medical decision-making.   EKG Interpretation None      MDM   Final diagnoses:  Acute pancreatitis, unspecified pancreatitis type    Patient returns with persistent abdominal pain did have vomiting today also had fever and chills. Patient's abdominal pain is intermittent and somewhat vague right lower quadrant. CT scan shows evidence of an  inguinal hernia in that area that may explain that pain. However labs and CT scan otherwise consistent with pancreatitis. With the fever and chills some concern for cholangitis. CT shows no abnormalities of the gallbladder. Ultrasound would be a better test at this point and will be available in the morning. Patient will be admitted for pancreatitis possible retained stoned in common bile duct or pancreatic duct. Will be covered with antibiotics for cholangitis. Since patient has penicillin allergy will be treated with Flagyl and Levaquin.  GI medicine will be made aware. Patient currently stable patient does not want any pain medicine although offered. Patient's primary care doctors Dr. Nevada Crane. Patient will be admitted. Patient will be kept nothing by mouth for now.    Fredia Sorrow, MD 09/02/15 1950  Addendum: Discuss with GI medicine Dr. Sydell Axon he will consult on the patient will see him in the morning he agrees with the treatment plan so far.  Fredia Sorrow, MD 09/02/15 2018

## 2015-09-02 NOTE — Progress Notes (Signed)
Pharmacy Antibiotic Note  Elijah [name] is a 75 y.o. male admitted on 09/02/2015 with intra abdominal infection.  Pharmacy has been consulted for levaquin dosing.  Plan: Levaquin 750 mg IV q24 hours F/u renal function, cultures and clinical course  Height: 5\' 10"  (177.8 cm) Weight: 230 lb (104.327 kg) IBW/kg (Calculated) : 73  Temp (24hrs), Avg:100.1 F (37.8 C), Min:98.8 F (37.1 C), Max:102.7 F (39.3 C)   Recent Labs Lab 08/30/15 0015 09/02/15 1732 09/02/15 1801  WBC 7.6 9.1  --   CREATININE 1.04 0.88  --   LATICACIDVEN  --   --  1.73    Estimated Creatinine Clearance: 91.8 mL/min (by C-G formula based on Cr of 0.88).    Allergies  Allergen Reactions  . Aspirin Other (See Comments)    Stomach ulcers.  . Penicillins Rash    Has patient had a PCN reaction causing immediate rash, facial/tongue/throat swelling, SOB or lightheadedness with hypotension: no Has patient had a PCN reaction causing severe rash involving mucus membranes or skin necrosis: No Has patient had a PCN reaction that required hospitalization No Has patient had a PCN reaction occurring within the last 10 years: No If all of the above answers are "NO", then may proceed with Cephalosporin use.     Antimicrobials this admission: levaquin 7/13 >>  Flagyl  7/13>>  Thank you for allowing pharmacy to be a part of this patient's care.  Excell Seltzer Poteet 09/02/2015 9:21 PM

## 2015-09-02 NOTE — ED Notes (Signed)
Right sided abdominal pain since Sunday night.  Seen here at that time.  Pt continues to have pain.  Vomited times 1 today.

## 2015-09-02 NOTE — H&P (Signed)
History and Physical    Elijah [name] [id number] DOB: 24-Sep-1940 DOA: 09/02/2015  PCP: Wende Neighbors, MD  Patient coming from: home  Chief Complaint:  Abdominal pain, chills, fever  HPI: Elijah [name] is a 75 y.o. male with medical history significant of multiple hernia surgeries, parkinsons, DM, HTN comes in with over 3 days of waxing and waning right sided abdominal pain.  Pt says it started Sunday night (4 nights ago) and the pain was more in the lower part of the right side.  He came to the ED and by the time he got to the ED his pain had subsided and he was sent home, it was thought his pain was related to a hernia.  On Monday the pain came back and was more along the whole right side radiating up and down.  He has not noticed any bulges.  The pain is not in the epigastric area and he points more to the right lower quadrant and mid right area.  The pain came and went for several days.  Today the pain got much worse and was more in the right upper quadrant but still along the whole right side.  He vomited and started to have fever and chills.  His wife is with him and says he has not been doing well all week,  Has been less active, not much of an appetitie.  She has not noticed him turning yellow.  He still has his gallbladder.  No new medications.  No etoh intake.  Pt found to have pancreatitis and juandice.  Referred for treatment and work up of his abdominal pain, juandice and pancreatitis.  ED Course: dr Gala Romney called by edp.  Iv flagyl and levaquin.  Ivf.  Blood cultures.    Review of Systems: As per HPI otherwise 10 point review of systems negative.   Past Medical History  Diagnosis Date  . Parkinson disease ([place])   . Diabetes mellitus without complication ([place])   . Hypertension   . Gout 10/07/2012  . Obesity   . Degenerative arthritis   . Low back pain   . Dyslipidemia   . Glaucoma   . Rotator cuff tear     Bilateral, no surgery  . OSA (obstructive sleep apnea) 04/10/2014    . Chronic low back pain 03/24/2015    Past Surgical History  Procedure Laterality Date  . Appendectomy    . Hernia repair    . Tonsillectomy    . Shoulder bone spur Left     RESECTION     reports that he has never smoked. He has never used smokeless tobacco. He reports that he does not drink alcohol or use illicit drugs.  Allergies  Allergen Reactions  . Aspirin Other (See Comments)    Stomach ulcers.  . Penicillins Rash    Has patient had a PCN reaction causing immediate rash, facial/tongue/throat swelling, SOB or lightheadedness with hypotension: no Has patient had a PCN reaction causing severe rash involving mucus membranes or skin necrosis: No Has patient had a PCN reaction that required hospitalization No Has patient had a PCN reaction occurring within the last 10 years: No If all of the above answers are "NO", then may proceed with Cephalosporin use.     Family History  Problem Relation Age of Onset  . Stroke Mother   . Diabetes Mother   . Heart Problems Mother   . Parkinsonism Father   . Heart Problems Father     Prior  to Admission medications   Medication Sig Start Date End Date Taking? Authorizing Provider  allopurinol (ZYLOPRIM) 100 MG tablet Take 100 mg by mouth daily.  10/05/12  Yes Historical Provider, MD  aspirin 81 MG tablet Take 81 mg by mouth daily.   Yes Historical Provider, MD  Azelastine HCl 0.15 % SOLN Place 1 spray into the nose daily as needed. 08/18/15  Yes Historical Provider, MD  carbidopa-levodopa (SINEMET CR) 50-200 MG tablet TAKE ONE TABLET FOUR TIMES A DAY 03/24/15  Yes Kathrynn Ducking, MD  cyclobenzaprine (FLEXERIL) 10 MG tablet Take 1 tablet (10 mg total) by mouth 2 (two) times daily as needed for muscle spasms. 07/02/12  Yes Teressa Lower, MD  furosemide (LASIX) 20 MG tablet Take 1 tablet by mouth 2 (two) times daily as needed for fluid or edema.  10/02/13  Yes Historical Provider, MD  gabapentin (NEURONTIN) 100 MG capsule Take 1 capsule (100 mg  total) by mouth 3 (three) times daily. 04/23/15  Yes Kathrynn Ducking, MD  LORazepam (ATIVAN) 1 MG tablet Take 1 mg by mouth as needed for anxiety.   Yes Historical Provider, MD  meloxicam (MOBIC) 15 MG tablet Take 15 mg by mouth daily as needed for pain.  09/30/12  Yes Historical Provider, MD  metFORMIN (GLUCOPHAGE) 500 MG tablet Take 500 mg by mouth 2 (two) times daily with a meal.  10/05/12  Yes Historical Provider, MD  metoprolol tartrate (LOPRESSOR) 25 MG tablet Take 12.5 mg by mouth daily. 1/2 TABLET DAILY   Yes Historical Provider, MD  pantoprazole (PROTONIX) 40 MG tablet Take 40 mg by mouth daily. 03/08/14  Yes Historical Provider, MD  potassium chloride SA (K-DUR,KLOR-CON) 20 MEQ tablet Take 1 tablet by mouth daily.  10/24/13  Yes Historical Provider, MD  pramipexole (MIRAPEX) 1.5 MG tablet Take 1 tablet (1.5 mg total) by mouth 3 (three) times daily. 03/24/15  Yes Kathrynn Ducking, MD  SALINE MIST SPRAY NA Place 1 spray into the nose as needed.    Yes Historical Provider, MD  simvastatin (ZOCOR) 40 MG tablet Take 20 mg by mouth at bedtime.  09/05/12  Yes Historical Provider, MD  valsartan-hydrochlorothiazide (DIOVAN-HCT) 80-12.5 MG per tablet Take 1 tablet by mouth daily.   Yes Historical Provider, MD  ACCU-CHEK AVIVA PLUS test strip  12/05/13   Historical Provider, MD    Physical Exam: Filed Vitals:   09/02/15 1759 09/02/15 1800 09/02/15 1830 09/02/15 1930  BP: 124/68 129/59 114/65 98/57  Pulse: 81 80  78  Temp:    98.8 F (37.1 C)  TempSrc:    Oral  Resp: 18   16  Height:      Weight:      SpO2: 94% 98% 80% 96%      Constitutional: NAD, calm, comfortable Filed Vitals:   09/02/15 1759 09/02/15 1800 09/02/15 1830 09/02/15 1930  BP: 124/68 129/59 114/65 98/57  Pulse: 81 80  78  Temp:    98.8 F (37.1 C)  TempSrc:    Oral  Resp: 18   16  Height:      Weight:      SpO2: 94% 98% 80% 96%   Eyes: PERRL, lids and conjunctivae normal.  Icteric.  Pt jaundiced. ENMT: Mucous membranes  are moist. Posterior pharynx clear of any exudate or lesions.Normal dentition.  Neck: normal, supple, no masses, no thyromegaly Respiratory: clear to auscultation bilaterally, no wheezing, no crackles. Normal respiratory effort. No accessory muscle use.  Cardiovascular: Regular rate and rhythm,  no murmurs / rubs / gallops. No extremity edema. 2+ pedal pulses. No carotid bruits.  Abdomen: ttp right side both upper and lower quadrants, no masses palpated. No hepatosplenomegaly. Bowel sounds positive. No hernias apprecaited. Musculoskeletal: no clubbing / cyanosis. No joint deformity upper and lower extremities. Good ROM, no contractures. Normal muscle tone.  Skin: no rashes, lesions, ulcers. No induration Neurologic: CN 2-12 grossly intact. Sensation intact, DTR normal. Strength 5/5 in all 4.  Psychiatric: Normal judgment and insight. Alert and oriented x 3. Normal mood.    Labs on Admission: I have personally reviewed following labs and imaging studies  CBC:  Recent Labs Lab 08/30/15 0015 09/02/15 1732  WBC 7.6 9.1  NEUTROABS  --  7.9*  HGB 13.2 12.5*  HCT 38.3* 35.7*  MCV 86.1 84.6  PLT 210 [id number]   Basic Metabolic Panel:  Recent Labs Lab 08/30/15 0015 09/02/15 1732  NA 136 132*  K 3.7 3.8  CL 105 101  CO2 25 24  GLUCOSE 230* 171*  BUN 20 19  CREATININE 1.04 0.88  CALCIUM 8.6* 8.5*   GFR: Estimated Creatinine Clearance: 91.8 mL/min (by C-G formula based on Cr of 0.88). Liver Function Tests:  Recent Labs Lab 08/30/15 0015 09/02/15 1732  AST 70* 55*  ALT 31 15*  ALKPHOS 121 168*  BILITOT 1.0 5.7*  PROT 7.0 6.8  ALBUMIN 4.2 3.6    Recent Labs Lab 08/30/15 0015 09/02/15 1732  LIPASE 39 383*   No results for input(s): AMMONIA in the last 168 hours. Coagulation Profile: No results for input(s): INR, PROTIME in the last 168 hours. Cardiac Enzymes: No results for input(s): CKTOTAL, CKMB, CKMBINDEX, TROPONINI in the last 168 hours. BNP (last 3 results) No  results for input(s): PROBNP in the last 8760 hours. HbA1C: No results for input(s): HGBA1C in the last 72 hours. CBG: No results for input(s): GLUCAP in the last 168 hours. Lipid Profile: No results for input(s): CHOL, HDL, LDLCALC, TRIG, CHOLHDL, LDLDIRECT in the last 72 hours. Thyroid Function Tests: No results for input(s): TSH, T4TOTAL, FREET4, T3FREE, THYROIDAB in the last 72 hours. Anemia Panel: No results for input(s): VITAMINB12, FOLATE, FERRITIN, TIBC, IRON, RETICCTPCT in the last 72 hours. Urine analysis:    Component Value Date/Time   COLORURINE YELLOW 08/30/2015 0001   APPEARANCEUR CLEAR 08/30/2015 0001   LABSPEC >1.030* 08/30/2015 0001   PHURINE 5.5 08/30/2015 0001   GLUCOSEU NEGATIVE 08/30/2015 0001   HGBUR NEGATIVE 08/30/2015 0001   BILIRUBINUR NEGATIVE 08/30/2015 0001   KETONESUR NEGATIVE 08/30/2015 0001   PROTEINUR NEGATIVE 08/30/2015 0001   UROBILINOGEN 0.2 10/14/2009 1851   NITRITE NEGATIVE 08/30/2015 0001   LEUKOCYTESUR NEGATIVE 08/30/2015 0001   Sepsis Labs: !!!!!!!!!!!!!!!!!!!!!!!!!!!!!!!!!!!!!!!!!!!! @LABRCNTIP (procalcitonin:4,lacticidven:4) )No results found for this or any previous visit (from the past 240 hour(s)).   Radiological Exams on Admission: Dg Chest 2 View  09/02/2015  CLINICAL DATA:  Chronic 1 year history of shortness of breath. EXAM: CHEST  2 VIEW COMPARISON:  07/11/2004. FINDINGS: Lateral image is suboptimal as the patient was unable to raise the arms. Patient is rotated to the right on the PA image. Cardiac silhouette mildly enlarged. Thoracic aorta mildly tortuous. Hilar and mediastinal contours otherwise unremarkable. Linear atelectasis or scarring in the right lower lobe. Lungs otherwise clear. No localized airspace consolidation. No pleural effusions. No pneumothorax. Normal pulmonary vascularity. IMPRESSION: Mild cardiomegaly. Linear atelectasis or scarring in the right lower lobe. No acute cardiopulmonary disease otherwise.  Electronically Signed   By: Sherran Needs.D.  On: 09/02/2015 19:12   Ct Abdomen Pelvis W Contrast  09/02/2015  CLINICAL DATA:  Right lower quadrant pain with nausea and vomiting for 3 days. Previous appendectomy. EXAM: CT ABDOMEN AND PELVIS WITH CONTRAST TECHNIQUE: Multidetector CT imaging of the abdomen and pelvis was performed using the standard protocol following bolus administration of intravenous contrast. CONTRAST:  17mL ISOVUE-300 IOPAMIDOL (ISOVUE-300) INJECTION 61% COMPARISON:  08/30/2015 FINDINGS: Lower chest:  No acute findings. Hepatobiliary: No masses or other significant abnormality. Gallbladder is unremarkable. Pancreas: Increased prominence of pancreatic head and uncinate process seen with mild hazy density in adjacent peripancreatic fat. This raises suspicion for focal pancreatitis. There is no evidence of solid pancreatic mass or pancreatic ductal dilatation. No evidence of pancreatic necrosis or peripancreatic fluid collections. Spleen: Within normal limits in size and appearance. Adrenals/Urinary Tract: No masses identified. Several tiny left renal cysts noted. No evidence of hydronephrosis. Stomach/Bowel: No evidence of obstruction, inflammatory process, or abnormal fluid collections. Vascular/Lymphatic: No pathologically enlarged lymph nodes. No evidence of abdominal aortic aneurysm. Aortic atherosclerosis noted. Reproductive: No mass or other significant abnormality. Other: Small right inguinal hernia again seen containing a small portion of the urinary bladder. No evidence of herniated bowel loops. Musculoskeletal:  No suspicious bone lesions identified. IMPRESSION: Increased prominence of pancreatic head and uncinate process with hazy density in adjacent fat, suspicious for mild focal pancreatitis. Recommend correlation with serum amylase and lipase levels. Stable small right inguinal hernia containing a small portion of the urinary bladder. Aortic atherosclerosis noted.  Electronically Signed   By: Earle Gell M.D.   On: 09/02/2015 19:17    Assessment/Plan 75 yo male with 4 days of right sided abdominal pain, acute pancreatitis and jaundice  Principal Problem:   Pancreatitis-  Rule out gallbladder disease.  Check ultrasound in the am.  Keep npo except meds.  Ivf.  Prn pain meds and zofran.  Gi consulted.  Cover for cholangitis with levaquin and flagyl.  Abdomen nonacute.    Active Problems:   OSA (obstructive sleep apnea)- noted   Chronic low back pain- noted   Parkinson disease ([place])- stable   Diabetes mellitus without complication ([place])- hold metformin, place on ssi   Hypertension- noted, holding meds   Admit to medical floor.     DVT prophylaxis: scds only until the need for ERCP /surgery determined Code Status: full code  Tee Richeson A MD [facility]  If 7PM-7AM, please contact night-coverage [email or web address] Password [email or web address]  09/02/2015, 8:01 PM

## 2015-09-02 NOTE — ED Notes (Signed)
Pt back to room from CT

## 2015-09-02 NOTE — ED Notes (Signed)
MD at bedside. 

## 2015-09-03 ENCOUNTER — Inpatient Hospital Stay (HOSPITAL_COMMUNITY): Payer: PPO

## 2015-09-03 DIAGNOSIS — E119 Type 2 diabetes mellitus without complications: Secondary | ICD-10-CM

## 2015-09-03 DIAGNOSIS — R7989 Other specified abnormal findings of blood chemistry: Secondary | ICD-10-CM

## 2015-09-03 DIAGNOSIS — K859 Acute pancreatitis without necrosis or infection, unspecified: Secondary | ICD-10-CM

## 2015-09-03 DIAGNOSIS — R17 Unspecified jaundice: Secondary | ICD-10-CM

## 2015-09-03 DIAGNOSIS — R945 Abnormal results of liver function studies: Secondary | ICD-10-CM

## 2015-09-03 LAB — CBC
HCT: 34.9 % — ABNORMAL LOW (ref 39.0–52.0)
Hemoglobin: 12 g/dL — ABNORMAL LOW (ref 13.0–17.0)
MCH: 29.5 pg (ref 26.0–34.0)
MCHC: 34.4 g/dL (ref 30.0–36.0)
MCV: 85.7 fL (ref 78.0–100.0)
PLATELETS: 182 10*3/uL (ref 150–400)
RBC: 4.07 MIL/uL — ABNORMAL LOW (ref 4.22–5.81)
RDW: 13.4 % (ref 11.5–15.5)
WBC: 8.3 10*3/uL (ref 4.0–10.5)

## 2015-09-03 LAB — COMPREHENSIVE METABOLIC PANEL
ALBUMIN: 3.2 g/dL — AB (ref 3.5–5.0)
ALK PHOS: 166 U/L — AB (ref 38–126)
ALT: 73 U/L — AB (ref 17–63)
ANION GAP: 10 (ref 5–15)
AST: 47 U/L — AB (ref 15–41)
BILIRUBIN TOTAL: 7.7 mg/dL — AB (ref 0.3–1.2)
BUN: 14 mg/dL (ref 6–20)
CO2: 25 mmol/L (ref 22–32)
CREATININE: 0.72 mg/dL (ref 0.61–1.24)
Calcium: 8.4 mg/dL — ABNORMAL LOW (ref 8.9–10.3)
Chloride: 101 mmol/L (ref 101–111)
GFR calc Af Amer: >60 mL/min (ref >60)
GFR calc non Af Amer: >60 mL/min (ref >60)
GLUCOSE: 130 mg/dL — AB (ref 65–99)
Potassium: 3.9 mmol/L (ref 3.5–5.1)
Sodium: 136 mmol/L (ref 135–145)
TOTAL PROTEIN: 6.3 g/dL — AB (ref 6.5–8.1)

## 2015-09-03 LAB — BILIRUBIN, FRACTIONATED(TOT/DIR/INDIR)
Bilirubin, Direct: 4.3 mg/dL — ABNORMAL HIGH (ref 0.1–0.5)
Indirect Bilirubin: 3.3 mg/dL — ABNORMAL HIGH (ref 0.3–0.9)
Total Bilirubin: 7.6 mg/dL — ABNORMAL HIGH (ref 0.3–1.2)

## 2015-09-03 LAB — PROTIME-INR
INR: 1.38 (ref 0.00–1.49)
Prothrombin Time: 17.1 seconds — ABNORMAL HIGH (ref 11.6–15.2)

## 2015-09-03 LAB — GLUCOSE, CAPILLARY
GLUCOSE-CAPILLARY: 115 mg/dL — AB (ref 65–99)
GLUCOSE-CAPILLARY: 115 mg/dL — AB (ref 65–99)
GLUCOSE-CAPILLARY: 155 mg/dL — AB (ref 65–99)
Glucose-Capillary: 91 mg/dL (ref 65–99)
Glucose-Capillary: 87 mg/dL (ref 65–99)

## 2015-09-03 MED ORDER — HYDROCORTISONE NA SUCCINATE PF 100 MG IJ SOLR
100.0000 mg | Freq: Once | INTRAMUSCULAR | Status: AC
  Administered 2015-09-03: 100 mg via INTRAVENOUS
  Filled 2015-09-03: qty 2

## 2015-09-03 MED ORDER — PANTOPRAZOLE SODIUM 40 MG PO TBEC
40.0000 mg | DELAYED_RELEASE_TABLET | Freq: Every day | ORAL | Status: DC
  Administered 2015-09-03 – 2015-09-07 (×4): 40 mg via ORAL
  Filled 2015-09-03 (×5): qty 1

## 2015-09-03 MED ORDER — MORPHINE SULFATE (PF) 2 MG/ML IV SOLN: 1.0000 mg | INTRAVENOUS | Status: DC | PRN

## 2015-09-03 MED ORDER — GABAPENTIN 100 MG PO CAPS
100.0000 mg | ORAL_CAPSULE | Freq: Three times a day (TID) | ORAL | Status: DC
  Administered 2015-09-03 – 2015-09-09 (×17): 100 mg via ORAL
  Filled 2015-09-03 (×17): qty 1

## 2015-09-03 MED ORDER — PRAMIPEXOLE DIHYDROCHLORIDE 1 MG PO TABS
1.5000 mg | ORAL_TABLET | Freq: Three times a day (TID) | ORAL | Status: DC
  Administered 2015-09-03 – 2015-09-09 (×17): 1.5 mg via ORAL
  Filled 2015-09-03 (×17): qty 2

## 2015-09-03 MED ORDER — CARBIDOPA-LEVODOPA ER 50-200 MG PO TBCR
1.0000 | EXTENDED_RELEASE_TABLET | Freq: Four times a day (QID) | ORAL | Status: DC
  Administered 2015-09-03 – 2015-09-09 (×22): 1 via ORAL
  Filled 2015-09-03 (×28): qty 1

## 2015-09-03 MED ORDER — SODIUM CHLORIDE 0.9 % IV BOLUS (SEPSIS)
1000.0000 mL | Freq: Once | INTRAVENOUS | Status: AC
  Administered 2015-09-03: 1000 mL via INTRAVENOUS

## 2015-09-03 MED ORDER — ALLOPURINOL 100 MG PO TABS
100.0000 mg | ORAL_TABLET | Freq: Every day | ORAL | Status: DC
  Administered 2015-09-03 – 2015-09-09 (×6): 100 mg via ORAL
  Filled 2015-09-03 (×7): qty 1

## 2015-09-03 NOTE — Care Management Important Message (Signed)
Important Message  Patient Details  Name: GILAD VANWIE MRN: [id number] Date of Birth: October 17, 1940   Medicare Important Message Given:  Yes    Zayvon Alicea, Chauncey Reading, RN 09/03/2015, 11:14 AM

## 2015-09-03 NOTE — Consult Note (Signed)
Referring Provider: No ref. provider found Primary Care Physician:  Wende Neighbors, MD Primary Gastroenterologist:  Dr. Gala Romney  Date of Admission: 09/02/15 Date of Consultation: 09/03/15  Reason for Consultation:  Possible gallstone pancreatitis  HPI:  Elijah [name] is a 75 y.o. male with a past medical history of Parkinson's, DM, dyslipidemia, gout, obesity with gallbladder in situ, multiple hernia surgeries presented to the ED yesterday evening with complaints of RLQ abdominal pain which began 4 nights prior to presentation. Pain was waxing/waning. Initially came to ED 08/30/15 but pain subsided. Pain returned more severe, pan-right abdomen, no noticeable bulging near his known inguinal hernia. Began having vomiting, fever, chills the day of presentation. Per family he has been less active, not himself all week. Denied ETOH. ER workup significant for lipase 383, elevated AST 55, elevated alk phos 168, bilirubin 5.7 (all previously normal 4 days ago). No leucocytosis, mild anemia with hgb 12.5. CT abdomen with likely pancreatitis without pancreatic necrosis or peripancreatic fluid collections. Also stable small right inguinal hernia containing a small portion of the urinary bladder; aortic atherosclerosis noted.  The patient was admitted for possible gallstone pancreatitis, U/S ordered for today. Started on IVF, NPO, IV antibiotics. Blood cultures pending.   Today he confirms the above information. Again denied ETOH, smoking, and recreational drugs. He is feeling somewhat better today, minimal abdominal pain, denies nausea and vomiting. When he does have abdominal pain it is more right-sided but will feel gassy and bloated at which point pain will radiate across his abdomen. No pain triggers that he can identify. Is hungry and wants to eat. Denies hematochezia, melena. Did have a fever before coming to the ER. Pain and nausea adequately controlled by medications at this point. Denies chest pain,  significant dyspnea. No other upper or lower GI complaints.  Past Medical History  Diagnosis Date  . Parkinson disease ([place])   . Diabetes mellitus without complication ([place])   . Hypertension   . Gout 10/07/2012  . Obesity   . Degenerative arthritis   . Low back pain   . Dyslipidemia   . Glaucoma   . Rotator cuff tear     Bilateral, no surgery  . OSA (obstructive sleep apnea) 04/10/2014  . Chronic low back pain 03/24/2015    Past Surgical History  Procedure Laterality Date  . Appendectomy    . Hernia repair    . Tonsillectomy    . Shoulder bone spur Left     RESECTION    Prior to Admission medications   Medication Sig Start Date End Date Taking? Authorizing Provider  allopurinol (ZYLOPRIM) 100 MG tablet Take 100 mg by mouth daily.  10/05/12  Yes Historical Provider, MD  aspirin 81 MG tablet Take 81 mg by mouth daily.   Yes Historical Provider, MD  Azelastine HCl 0.15 % SOLN Place 1 spray into the nose daily as needed. 08/18/15  Yes Historical Provider, MD  carbidopa-levodopa (SINEMET CR) 50-200 MG tablet TAKE ONE TABLET FOUR TIMES A DAY 03/24/15  Yes Kathrynn Ducking, MD  cyclobenzaprine (FLEXERIL) 10 MG tablet Take 1 tablet (10 mg total) by mouth 2 (two) times daily as needed for muscle spasms. 07/02/12  Yes Teressa Lower, MD  furosemide (LASIX) 20 MG tablet Take 1 tablet by mouth 2 (two) times daily as needed for fluid or edema.  10/02/13  Yes Historical Provider, MD  gabapentin (NEURONTIN) 100 MG capsule Take 1 capsule (100 mg total) by mouth 3 (three) times daily. 04/23/15  Yes Charles  Corrinne Eagle, MD  LORazepam (ATIVAN) 1 MG tablet Take 1 mg by mouth as needed for anxiety.   Yes Historical Provider, MD  meloxicam (MOBIC) 15 MG tablet Take 15 mg by mouth daily as needed for pain.  09/30/12  Yes Historical Provider, MD  metFORMIN (GLUCOPHAGE) 500 MG tablet Take 500 mg by mouth 2 (two) times daily with a meal.  10/05/12  Yes Historical Provider, MD  metoprolol tartrate (LOPRESSOR) 25 MG tablet  Take 12.5 mg by mouth daily. 1/2 TABLET DAILY   Yes Historical Provider, MD  pantoprazole (PROTONIX) 40 MG tablet Take 40 mg by mouth daily. 03/08/14  Yes Historical Provider, MD  potassium chloride SA (K-DUR,KLOR-CON) 20 MEQ tablet Take 1 tablet by mouth daily.  10/24/13  Yes Historical Provider, MD  pramipexole (MIRAPEX) 1.5 MG tablet Take 1 tablet (1.5 mg total) by mouth 3 (three) times daily. 03/24/15  Yes York Spaniel, MD  SALINE MIST SPRAY NA Place 1 spray into the nose as needed.    Yes Historical Provider, MD  simvastatin (ZOCOR) 40 MG tablet Take 20 mg by mouth at bedtime.  09/05/12  Yes Historical Provider, MD  valsartan-hydrochlorothiazide (DIOVAN-HCT) 80-12.5 MG per tablet Take 1 tablet by mouth daily.   Yes Historical Provider, MD  ACCU-CHEK AVIVA PLUS test strip  12/05/13   Historical Provider, MD    Current Facility-Administered Medications  Medication Dose Route Frequency Provider Last Rate Last Dose  . 0.9 %  sodium chloride infusion   Intravenous Continuous Haydee Monica, MD      . HYDROmorphone (DILAUDID) injection 1 mg  1 mg Intravenous Q2H PRN Haydee Monica, MD   1 mg at 09/03/15 0531  . insulin aspart (novoLOG) injection 0-9 Units  0-9 Units Subcutaneous TID WC Rachal A Onalee Hua, MD      . levofloxacin (LEVAQUIN) IVPB 750 mg  750 mg Intravenous Q24H Haydee Monica, MD      . metroNIDAZOLE (FLAGYL) IVPB 500 mg  500 mg Intravenous Q8H Haydee Monica, MD   500 mg at 09/03/15 0531  . ondansetron (ZOFRAN) tablet 4 mg  4 mg Oral Q6H PRN Haydee Monica, MD       Or  . ondansetron [facility]) injection 4 mg  4 mg Intravenous Q6H PRN Haydee Monica, MD        Allergies as of 09/02/2015 - Review Complete 09/02/2015  Allergen Reaction Noted  . Aspirin Other (See Comments) 07/01/2012  . Penicillins Rash 07/01/2012    Family History  Problem Relation Age of Onset  . Stroke Mother   . Diabetes Mother   . Heart Problems Mother   . Parkinsonism Father   . Heart Problems Father      Social History   Social History  . Marital Status: Married    Spouse Name: Bonita Quin  . Number of Children: 4  . Years of Education: college   Occupational History  . Retired    Social History Main Topics  . Smoking status: Never Smoker   . Smokeless tobacco: Never Used  . Alcohol Use: No  . Drug Use: No  . Sexual Activity: Not on file   Other Topics Concern  . Not on file   Social History Narrative   Patient is married Bonita Quin) and lives at home with his wife.   Patient has four adult children.   Patient is retired.   Patient has a college education.   Patient is left-handed.   Patient drinks one  soda and 1 glass of tea.    Review of Systems: Gen: Denies loss of appetite, change in weight or weight loss CV: Denies chest pain, heart palpitations, syncope, edema  Resp: Denies shortness of breath with rest, cough, wheezing GI: See HPI.  Derm: Denies rash, itching, dry skin Psych: Denies confusion, or memory loss Heme: Denies bruising, bleeding.  Physical Exam: Vital signs in last 24 hours: Temp:  [98.2 F (36.8 C)-102.7 F (39.3 C)] 98.2 F (36.8 C) (07/13 2218) Pulse Rate:  [78-93] 78 (07/13 1930) Resp:  [16-28] 20 (07/13 2218) BP: (98-129)/(48-68) 98/57 mmHg (07/13 1930) SpO2:  [80 %-98 %] 97 % (07/13 2218) Weight:  [230 lb (104.327 kg)-233 lb 3.2 oz (105.779 kg)] 233 lb 3.2 oz (105.779 kg) (07/13 2218)   General:   Alert,  Well-developed, well-nourished, pleasant and cooperative in NAD. Appears jaundiced. Head:  Normocephalic and atraumatic. Eyes:  Positive icterus. Ears:  Normal auditory acuity. Neck:  Supple; no masses or thyromegaly. Lungs:  Clear throughout to auscultation.   No wheezes, crackles, or rhonchi. No acute distress. Heart:  Regular rate and rhythm; no murmurs, clicks, rubs,  or gallops. Abdomen:  Soft, and nondistended. Normal bowel sounds, without guarding, and without rebound. On palpation he had increase in abdominal pain minimally to the  RLQ, more moderately to RUQ.  Rectal:  Deferred.   Msk:  Symmetrical without gross deformities. Extremities:  Without clubbing or edema. Neurologic:  Alert and  oriented x4;  grossly normal neurologically. Skin:  Intact without significant lesions or rashes. Psych:  Alert and cooperative. Normal mood and affect.  Intake/Output from previous day: 07/13 0701 - 07/14 0700 In: -  Out: 300 [Urine:300] Intake/Output this shift:    Lab Results:  Recent Labs  09/02/15 1732 09/03/15 0505  WBC 9.1 8.3  HGB 12.5* 12.0*  HCT 35.7* 34.9*  PLT 189 182   BMET  Recent Labs  09/02/15 1732 09/03/15 0505  NA 132* 136  K 3.8 3.9  CL 101 101  CO2 24 25  GLUCOSE 171* 130*  BUN 19 14  CREATININE 0.88 0.72  CALCIUM 8.5* 8.4*   LFT  Recent Labs  09/02/15 1732 09/03/15 0505  PROT 6.8 6.3*  ALBUMIN 3.6 3.2*  AST 55* 47*  ALT 15* 73*  ALKPHOS 168* 166*  BILITOT 5.7* 7.7*   PT/INR  Recent Labs  09/03/15 0505  LABPROT 17.1*  INR 1.38   Hepatitis Panel No results for input(s): HEPBSAG, HCVAB, HEPAIGM, HEPBIGM in the last 72 hours. C-Diff No results for input(s): CDIFFTOX in the last 72 hours.  Studies/Results: Dg Chest 2 View  09/02/2015  CLINICAL DATA:  Chronic 1 year history of shortness of breath. EXAM: CHEST  2 VIEW COMPARISON:  07/11/2004. FINDINGS: Lateral image is suboptimal as the patient was unable to raise the arms. Patient is rotated to the right on the PA image. Cardiac silhouette mildly enlarged. Thoracic aorta mildly tortuous. Hilar and mediastinal contours otherwise unremarkable. Linear atelectasis or scarring in the right lower lobe. Lungs otherwise clear. No localized airspace consolidation. No pleural effusions. No pneumothorax. Normal pulmonary vascularity. IMPRESSION: Mild cardiomegaly. Linear atelectasis or scarring in the right lower lobe. No acute cardiopulmonary disease otherwise. Electronically Signed   By: Evangeline Dakin M.D.   On: 09/02/2015 19:12    Ct Abdomen Pelvis W Contrast  09/02/2015  CLINICAL DATA:  Right lower quadrant pain with nausea and vomiting for 3 days. Previous appendectomy. EXAM: CT ABDOMEN AND PELVIS WITH CONTRAST TECHNIQUE: Multidetector  CT imaging of the abdomen and pelvis was performed using the standard protocol following bolus administration of intravenous contrast. CONTRAST:  175m ISOVUE-300 IOPAMIDOL (ISOVUE-300) INJECTION 61% COMPARISON:  08/30/2015 FINDINGS: Lower chest:  No acute findings. Hepatobiliary: No masses or other significant abnormality. Gallbladder is unremarkable. Pancreas: Increased prominence of pancreatic head and uncinate process seen with mild hazy density in adjacent peripancreatic fat. This raises suspicion for focal pancreatitis. There is no evidence of solid pancreatic mass or pancreatic ductal dilatation. No evidence of pancreatic necrosis or peripancreatic fluid collections. Spleen: Within normal limits in size and appearance. Adrenals/Urinary Tract: No masses identified. Several tiny left renal cysts noted. No evidence of hydronephrosis. Stomach/Bowel: No evidence of obstruction, inflammatory process, or abnormal fluid collections. Vascular/Lymphatic: No pathologically enlarged lymph nodes. No evidence of abdominal aortic aneurysm. Aortic atherosclerosis noted. Reproductive: No mass or other significant abnormality. Other: Small right inguinal hernia again seen containing a small portion of the urinary bladder. No evidence of herniated bowel loops. Musculoskeletal:  No suspicious bone lesions identified. IMPRESSION: Increased prominence of pancreatic head and uncinate process with hazy density in adjacent fat, suspicious for mild focal pancreatitis. Recommend correlation with serum amylase and lipase levels. Stable small right inguinal hernia containing a small portion of the urinary bladder. Aortic atherosclerosis noted. Electronically Signed   By: JEarle GellM.D.   On: 09/02/2015 19:17     Impression: 75year old male with pancreatitis per labs and imaging, currently NPO on fluids and antibiotics. Abdominal U/S ordered for today to rule out cholelithiasis, dilated CBD due to retained stone or other process.   Symptomatically improved today, less abdominal pain, no N/V. Did have increased pain in the right side with palpation (RUQ>RLQ). Pain, N/V adequately controlled on current regimen. He is jaundiced with scleral icterus. Uncertain about utility of aggressive hydration given that symptoms first presented almost a week ago and generally recommended during the first 12 to 24 hours. AFebrile since 5pm yesterday.  Overall, presentation seems indicative of gallstone pancreatitis with worsening labs. Patient was transported to ultrasound at the end of my visit with him today. If he had stone obstruction will need to discuss ERCP for stone extraction.   Plan: 1. U/S as ordered today 2. Fractionated bilirubin 3. Follow LFTs 4. Continue NPO and fluids 5. Continue pain management 6. Pending U/S results: possible need for ERCP versus additional workup for etiology 7. Supportive measures   EWalden Field AGNP-C Adult & Gerontological Nurse Practitioner [facility]    LOS: 1 day     09/03/2015, 8:29 AM

## 2015-09-03 NOTE — Care Management Note (Signed)
Case Management Note  Patient Details  Name: Elijah [name] MRN: [id number] Date of Birth: August 03, 1940  Subjective/Objective: Patient is admitted from home with pancreatitis.  He is ind with ADL's, walks with a cane. He has a PCP and reports no issues obtaning medications.     Action/Plan: Anticipate d/c home with self care. Patient has inquired about a rollator. Will check with DME company to see if this is an option.   Expected Discharge Date:  09/05/15               Expected Discharge Plan:  Home/Self Care  In-House Referral:  NA  Discharge planning Services  CM Consult  Post Acute Care Choice:  NA Choice offered to:  NA  DME Arranged:    DME Agency:     HH Arranged:    HH Agency:     Status of Service:     If discussed at [employer] of Stay Meetings, dates discussed:    Additional Comments:  Dvid Pendry, Chauncey Reading, RN 09/03/2015, 11:08 AM

## 2015-09-03 NOTE — Progress Notes (Signed)
Patient has c/o swollen throat to right side, chest is red, cheeks are red, seems to be reacting to something, paged on call MD, will follow any new orders and continue to monitor the patient.

## 2015-09-03 NOTE — Progress Notes (Signed)
PROGRESS NOTE    Elijah [name]  [id number] DOB: March 23, 1940 DOA: 09/02/2015 PCP: Wende Neighbors, MD  Outpatient Specialists:  Orthopedic surgery: Kyra Leyland, MD   Brief Narrative:  47 yom with a Hx of HTN, DM, and multiple hernia surgeries presented with complaints of waxing and waning abd pain with associated Sx of vomiting, fever, chills and no appetite. While in the ED he was noted to have pancreatitis and jaundice. He was admitted for further treatment.   Assessment & Plan:   Principal Problem:   Pancreatitis Active Problems:   OSA (obstructive sleep apnea)   Chronic low back pain   Parkinson disease ([facility])   Diabetes mellitus without complication ([facility])   Hypertension   Essential hypertension   Jaundice   Elevated LFTs 1. Bilary pancreatitis. Started on Zofran, IV abx, and pain management. US revealed cholelithiasis and gallbladder wall thickening. GI following. Pt is unable to tolerate MRCP discussed with Dr. Gala Romney and it was felt that since his CBD is not dilated and clinically he is feeling better that ERCP may not be warranted at this time. Continue supportive treatment for now and recheck liver function test in the morning. Continue clear liquids as tolerated. General surgery consult requested.  2. Chronic low back pain. Continue pain management.  3. Parkinson disease. Stable continue on sinemet.   4. DM without complication. Continue to hold metformin Will continue on sliding scale insulin.  5. Essential HTN. BP is running on the lower side, Hold divan and lasix.  6. HLD. Hold statin in the setting of elevated LFT.    DVT prophylaxis: Levaquin Code Status: Full Family Communication: Family bedside Disposition Plan: Home within 2-3 days    Consultants:   Gastroenterology  Procedures:  None   Antimicrobials:   Flagyl   Levaquin   Subjective: Does not have any pain at the time. Abd pain has improved. Denies nausea.   Objective: Filed Vitals:   09/02/15 1830 09/02/15 1930 09/02/15 2105 09/02/15 2218  BP: 114/65 98/57    Pulse:  78    Temp:  98.8 F (37.1 C)  98.2 F (36.8 C)  TempSrc:  Oral  Oral  Resp:  16  20  Height:   5\' 10"  (1.778 m)   Weight:    105.779 kg (233 lb 3.2 oz)  SpO2: 80% 96%  97%    Intake/Output Summary (Last 24 hours) at 09/03/15 0930 Last data filed at 09/02/15 2230  Gross per 24 hour  Intake      0 ml  Output    300 ml  Net   -300 ml   Filed Weights   09/02/15 1654 09/02/15 2218  Weight: 104.327 kg (230 lb) 105.779 kg (233 lb 3.2 oz)    Examination:  General exam: Appears calm and comfortable  Respiratory system: Clear to auscultation. Respiratory effort normal. Cardiovascular system: S1 & S2 heard, RRR. No JVD, murmurs, rubs, gallops or clicks. No pedal edema. Gastrointestinal system: Abdomen is nondistended, soft. Tender on the right side, more so in the RUQ. No organomegaly or masses felt. Normal bowel sounds heard. Central nervous system: Alert and oriented. No focal neurological deficits. Extremities: Symmetric 5 x 5 power. Skin: No rashes, lesions or ulcers Psychiatry: Judgement and insight appear normal. Mood & affect appropriate.     Data Reviewed: I have personally reviewed following labs and imaging studies  CBC:  Recent Labs Lab 08/30/15 0015 09/02/15 1732 09/03/15 0505  WBC 7.6 9.1 8.3  NEUTROABS  --  7.9*  --   HGB 13.2 12.5* 12.0*  HCT 38.3* 35.7* 34.9*  MCV 86.1 84.6 85.7  PLT 210 189 [id number]   Basic Metabolic Panel:  Recent Labs Lab 08/30/15 0015 09/02/15 1732 09/03/15 0505  NA 136 132* 136  K 3.7 3.8 3.9  CL 105 101 101  CO2 25 24 25   GLUCOSE 230* 171* 130*  BUN 20 19 14   CREATININE 1.04 0.88 0.72  CALCIUM 8.6* 8.5* 8.4*   GFR: Estimated Creatinine Clearance: 101.6 mL/min (by C-G formula based on Cr of 0.72). Liver Function Tests:  Recent Labs Lab 08/30/15 0015 09/02/15 1732 09/03/15 0505  AST 70* 55* 47*  ALT 31 15* 73*  ALKPHOS 121 168*  166*  BILITOT 1.0 5.7* 7.7*  PROT 7.0 6.8 6.3*  ALBUMIN 4.2 3.6 3.2*    Recent Labs Lab 08/30/15 0015 09/02/15 1732  LIPASE 39 383*   No results for input(s): AMMONIA in the last 168 hours. Coagulation Profile:  Recent Labs Lab 09/03/15 0505  INR 1.38   Cardiac Enzymes: No results for input(s): CKTOTAL, CKMB, CKMBINDEX, TROPONINI in the last 168 hours. BNP (last 3 results) No results for input(s): PROBNP in the last 8760 hours. HbA1C: No results for input(s): HGBA1C in the last 72 hours. CBG:  Recent Labs Lab 09/02/15 2258 09/03/15 0813  GLUCAP 130* 115*   Lipid Profile: No results for input(s): CHOL, HDL, LDLCALC, TRIG, CHOLHDL, LDLDIRECT in the last 72 hours. Thyroid Function Tests: No results for input(s): TSH, T4TOTAL, FREET4, T3FREE, THYROIDAB in the last 72 hours. Anemia Panel: No results for input(s): VITAMINB12, FOLATE, FERRITIN, TIBC, IRON, RETICCTPCT in the last 72 hours. Urine analysis:    Component Value Date/Time   COLORURINE YELLOW 09/02/2015 2018   APPEARANCEUR CLEAR 09/02/2015 2018   LABSPEC <1.005* 09/02/2015 2018   PHURINE 6.5 09/02/2015 2018   GLUCOSEU NEGATIVE 09/02/2015 2018   HGBUR NEGATIVE 09/02/2015 2018   BILIRUBINUR MODERATE* 09/02/2015 2018   KETONESUR NEGATIVE 09/02/2015 2018   PROTEINUR NEGATIVE 09/02/2015 2018   UROBILINOGEN 0.2 10/14/2009 1851   NITRITE NEGATIVE 09/02/2015 2018   LEUKOCYTESUR NEGATIVE 09/02/2015 2018   Sepsis Labs: @LABRCNTIP (procalcitonin:4,lacticidven:4)  )No results found for this or any previous visit (from the past 240 hour(s)).       Radiology Studies: Dg Chest 2 View  09/02/2015  CLINICAL DATA:  Chronic 1 year history of shortness of breath. EXAM: CHEST  2 VIEW COMPARISON:  07/11/2004. FINDINGS: Lateral image is suboptimal as the patient was unable to raise the arms. Patient is rotated to the right on the PA image. Cardiac silhouette mildly enlarged. Thoracic aorta mildly tortuous. Hilar and  mediastinal contours otherwise unremarkable. Linear atelectasis or scarring in the right lower lobe. Lungs otherwise clear. No localized airspace consolidation. No pleural effusions. No pneumothorax. Normal pulmonary vascularity. IMPRESSION: Mild cardiomegaly. Linear atelectasis or scarring in the right lower lobe. No acute cardiopulmonary disease otherwise. Electronically Signed   By: Evangeline Dakin M.D.   On: 09/02/2015 19:12   Ct Abdomen Pelvis W Contrast  09/02/2015  CLINICAL DATA:  Right lower quadrant pain with nausea and vomiting for 3 days. Previous appendectomy. EXAM: CT ABDOMEN AND PELVIS WITH CONTRAST TECHNIQUE: Multidetector CT imaging of the abdomen and pelvis was performed using the standard protocol following bolus administration of intravenous contrast. CONTRAST:  144mL ISOVUE-300 IOPAMIDOL (ISOVUE-300) INJECTION 61% COMPARISON:  08/30/2015 FINDINGS: Lower chest:  No acute findings. Hepatobiliary: No masses or other significant abnormality. Gallbladder is unremarkable. Pancreas: Increased prominence of pancreatic head  and uncinate process seen with mild hazy density in adjacent peripancreatic fat. This raises suspicion for focal pancreatitis. There is no evidence of solid pancreatic mass or pancreatic ductal dilatation. No evidence of pancreatic necrosis or peripancreatic fluid collections. Spleen: Within normal limits in size and appearance. Adrenals/Urinary Tract: No masses identified. Several tiny left renal cysts noted. No evidence of hydronephrosis. Stomach/Bowel: No evidence of obstruction, inflammatory process, or abnormal fluid collections. Vascular/Lymphatic: No pathologically enlarged lymph nodes. No evidence of abdominal aortic aneurysm. Aortic atherosclerosis noted. Reproductive: No mass or other significant abnormality. Other: Small right inguinal hernia again seen containing a small portion of the urinary bladder. No evidence of herniated bowel loops. Musculoskeletal:  No  suspicious bone lesions identified. IMPRESSION: Increased prominence of pancreatic head and uncinate process with hazy density in adjacent fat, suspicious for mild focal pancreatitis. Recommend correlation with serum amylase and lipase levels. Stable small right inguinal hernia containing a small portion of the urinary bladder. Aortic atherosclerosis noted. Electronically Signed   By: Earle Gell M.D.   On: 09/02/2015 19:17        Scheduled Meds: . insulin aspart  0-9 Units Subcutaneous TID WC  . levofloxacin (LEVAQUIN) IV  750 mg Intravenous Q24H  . metronidazole  500 mg Intravenous Q8H   Continuous Infusions: . sodium chloride       LOS: 1 day    Time spent: 20 minutes     Kathie Dike, MD [facility] Pager [phone]  If 7PM-7AM, please contact night-coverage [email or web address] Password [email or web address] 09/03/2015, 9:30 AM    By signing my name below, I, Rennis Harding, attest that this documentation has been prepared under the direction and in the presence of Kathie Dike, MD. Electronically signed: Rennis Harding, Scribe. 07/14/20171:45PM    I, Dr. Kathie Dike, personally performed the services described in this documentaiton. All medical record entries made by the scribe were at my direction and in my presence. I have reviewed the chart and agree that the record reflects my personal performance and is accurate and complete  Kathie Dike, MD, 09/03/2015 2:39 PM

## 2015-09-03 NOTE — Progress Notes (Signed)
U/S resulted: Cholelithiasis noted with positive Murphy's sign. CBD 8 mm which is normal for patient's age. Elevated direct and indirect bilirubin is consistent with ductal obstruction.  Discussed with Dr. Gala Romney. Possible passed stone, debris, non-calcified stone, non-visualized tumor, other extrahepatic obstruction. Will order STAT MRCP to evaluate further. Follow labs closely. Possible ERCP pending MRCP results but hesitent to due so at this time due to procedural risks without documented stone.  Will likely need cholecystectomy at some point.

## 2015-09-03 NOTE — Progress Notes (Signed)
Spoke with Elijah [name] in MRI. Patient unlikely to fit in MRI scanner at [facility] especially given significant claustrophobia and anxiety. Order put in for MRCP in wide-bore bariatric scanner in [place]. Elijah [name] to notify [facility] radiology. I have notified Network engineer and nurse of need for transfer to and from MRI in [place]. Patient notified as well.

## 2015-09-03 NOTE — Progress Notes (Signed)
Patient states he is unable to tolerate large bore MRI as well (previously attempted, with sedation). Discussed with Dr. Gala Romney. At this point, given he is only mildly symptomatic and labs are not severely elevated we can follow his labs. Recheck tomorrow.  Marland Kitchen

## 2015-09-04 DIAGNOSIS — K8010 Calculus of gallbladder with chronic cholecystitis without obstruction: Secondary | ICD-10-CM

## 2015-09-04 DIAGNOSIS — R17 Unspecified jaundice: Secondary | ICD-10-CM | POA: Insufficient documentation

## 2015-09-04 DIAGNOSIS — M545 Low back pain: Secondary | ICD-10-CM

## 2015-09-04 DIAGNOSIS — G8929 Other chronic pain: Secondary | ICD-10-CM

## 2015-09-04 DIAGNOSIS — K8020 Calculus of gallbladder without cholecystitis without obstruction: Secondary | ICD-10-CM | POA: Insufficient documentation

## 2015-09-04 LAB — GLUCOSE, CAPILLARY
GLUCOSE-CAPILLARY: 150 mg/dL — AB (ref 65–99)
GLUCOSE-CAPILLARY: 153 mg/dL — AB (ref 65–99)
Glucose-Capillary: 149 mg/dL — ABNORMAL HIGH (ref 65–99)
Glucose-Capillary: 186 mg/dL — ABNORMAL HIGH (ref 65–99)

## 2015-09-04 LAB — HEPATIC FUNCTION PANEL
ALK PHOS: 189 U/L — AB (ref 38–126)
ALT: 8 U/L — AB (ref 17–63)
AST: 42 U/L — ABNORMAL HIGH (ref 15–41)
Albumin: 3.1 g/dL — ABNORMAL LOW (ref 3.5–5.0)
BILIRUBIN DIRECT: 5 mg/dL — AB (ref 0.1–0.5)
BILIRUBIN INDIRECT: 2.6 mg/dL — AB (ref 0.3–0.9)
TOTAL PROTEIN: 6.4 g/dL — AB (ref 6.5–8.1)
Total Bilirubin: 7.6 mg/dL — ABNORMAL HIGH (ref 0.3–1.2)

## 2015-09-04 LAB — CBC
HEMATOCRIT: 36.4 % — AB (ref 39.0–52.0)
Hemoglobin: 12.4 g/dL — ABNORMAL LOW (ref 13.0–17.0)
MCH: 29.3 pg (ref 26.0–34.0)
MCHC: 34.1 g/dL (ref 30.0–36.0)
MCV: 86.1 fL (ref 78.0–100.0)
Platelets: 188 10*3/uL (ref 150–400)
RBC: 4.23 MIL/uL (ref 4.22–5.81)
RDW: 13.7 % (ref 11.5–15.5)
WBC: 5.8 10*3/uL (ref 4.0–10.5)

## 2015-09-04 LAB — BASIC METABOLIC PANEL
Anion gap: 11 (ref 5–15)
BUN: 13 mg/dL (ref 6–20)
CHLORIDE: 103 mmol/L (ref 101–111)
CO2: 22 mmol/L (ref 22–32)
CREATININE: 0.66 mg/dL (ref 0.61–1.24)
Calcium: 8.4 mg/dL — ABNORMAL LOW (ref 8.9–10.3)
GFR calc Af Amer: >60 mL/min (ref >60)
GFR calc non Af Amer: >60 mL/min (ref >60)
GLUCOSE: 168 mg/dL — AB (ref 65–99)
Potassium: 4.3 mmol/L (ref 3.5–5.1)
Sodium: 136 mmol/L (ref 135–145)

## 2015-09-04 MED ORDER — SIMETHICONE 80 MG PO CHEW
160.0000 mg | CHEWABLE_TABLET | Freq: Four times a day (QID) | ORAL | Status: DC | PRN
  Administered 2015-09-04 – 2015-09-06 (×4): 160 mg via ORAL
  Filled 2015-09-04 (×4): qty 2

## 2015-09-04 MED ORDER — SODIUM CHLORIDE 0.9 % IV SOLN
500.0000 mg | Freq: Three times a day (TID) | INTRAVENOUS | Status: DC
  Administered 2015-09-04 – 2015-09-09 (×15): 500 mg via INTRAVENOUS
  Filled 2015-09-04 (×19): qty 500

## 2015-09-04 MED ORDER — ENOXAPARIN SODIUM 60 MG/0.6ML ~~LOC~~ SOLN
50.0000 mg | SUBCUTANEOUS | Status: DC
  Administered 2015-09-04 – 2015-09-05 (×2): 50 mg via SUBCUTANEOUS
  Filled 2015-09-04 (×2): qty 0.6

## 2015-09-04 NOTE — Progress Notes (Signed)
Discussed with Dr. Arnoldo Morale at length.  Patient denies any abdominal pain to me this morning. Tolerating clear liquids very well. MAXIMUM TEMPERATURE 99 overnight. White blood cell count 5.8.   Total bilirubin hovering in the 7.6 range (predominantly direct).   Vital signs in last 24 hours: Temp:  [97.4 F (36.3 C)-99.1 F (37.3 C)] 97.4 F (36.3 C) (07/15 0500) Pulse Rate:  [58-72] 58 (07/15 0500) Resp:  [20] 20 (07/15 0500) BP: (87-180)/(43-100) 138/69 mmHg (07/15 0500) SpO2:  [94 %-97 %] 97 % (07/15 0500) Weight:  [237 lb 11.2 oz (107.82 kg)] 237 lb 11.2 oz (107.82 kg) (07/15 0500) Last BM Date: 09/02/15 General:   pleasant and cooperative in NAD. He is jaundiced.  Abdomen:  Nondistended. Positive bowel sounds. Soft and nontender. Extremities:  Without clubbing or edema.    Intake/Output from previous day: 07/14 0701 - 07/15 0700 In: 1261.3 [I.V.:161.3; IV Piggyback:1100] Out: 700 [Urine:700] Intake/Output this shift:    Lab Results:  Recent Labs  09/02/15 1732 09/03/15 0505 09/04/15 0533  WBC 9.1 8.3 5.8  HGB 12.5* 12.0* 12.4*  HCT 35.7* 34.9* 36.4*  PLT 189 182 188   BMET  Recent Labs  09/02/15 1732 09/03/15 0505 09/04/15 0533  NA 132* 136 136  K 3.8 3.9 4.3  CL 101 101 103  CO2 24 25 22   GLUCOSE 171* 130* 168*  BUN 19 14 13   CREATININE 0.88 0.72 0.66  CALCIUM 8.5* 8.4* 8.4*   LFT  Recent Labs  09/04/15 0533  PROT 6.4*  ALBUMIN 3.1*  AST 42*  ALT 8*  ALKPHOS 189*  BILITOT 7.6*  BILIDIR 5.0*  IBILI 2.6*   PT/INR  Recent Labs  09/03/15 0505  LABPROT 17.1*  INR 1.38   Hepatitis Panel No results for input(s): HEPBSAG, HCVAB, HEPAIGM, HEPBIGM in the last 72 hours. C-Diff No results for input(s): CDIFFTOX in the last 72 hours.  Studies/Results: Dg Chest 2 View  09/02/2015  CLINICAL DATA:  Chronic 1 year history of shortness of breath. EXAM: CHEST  2 VIEW COMPARISON:  07/11/2004. FINDINGS: Lateral image is suboptimal as the patient was  unable to raise the arms. Patient is rotated to the right on the PA image. Cardiac silhouette mildly enlarged. Thoracic aorta mildly tortuous. Hilar and mediastinal contours otherwise unremarkable. Linear atelectasis or scarring in the right lower lobe. Lungs otherwise clear. No localized airspace consolidation. No pleural effusions. No pneumothorax. Normal pulmonary vascularity. IMPRESSION: Mild cardiomegaly. Linear atelectasis or scarring in the right lower lobe. No acute cardiopulmonary disease otherwise. Electronically Signed   By: Evangeline Dakin M.D.   On: 09/02/2015 19:12   US Abdomen Complete  09/03/2015  CLINICAL DATA:  Jaundice EXAM: ABDOMEN ULTRASOUND COMPLETE COMPARISON:  09/02/2015 FINDINGS: Gallbladder: Multiple gallstones are noted. Gallbladder wall thickening is noted to 6 mm. Negative sonographic Percell Miller sign is noted. Common bile duct: Diameter: 8 mm. This is likely within normal limits given the patient's age. No biliary ductal dilatation was seen on recent CT examination. Liver: Mild increased echogenicity without definitive focal mass. No biliary ductal dilatation is seen. IVC: No abnormality visualized. Pancreas: Visualized portion unremarkable. Spleen: Size and appearance within normal limits. Right Kidney: Length: 11.4 cm. Echogenicity within normal limits. No mass or hydronephrosis visualized. Left Kidney: Length: 12.5 cm. Echogenicity within normal limits. No mass or hydronephrosis visualized. Abdominal aorta: No aneurysm visualized. Other findings: None. IMPRESSION: Cholelithiasis and gallbladder wall thickening. Mild prominence of the common bile duct although this may be age related. Electronically Signed  By: Inez Catalina M.D.   On: 09/03/2015 09:36   Ct Abdomen Pelvis W Contrast  09/02/2015  CLINICAL DATA:  Right lower quadrant pain with nausea and vomiting for 3 days. Previous appendectomy. EXAM: CT ABDOMEN AND PELVIS WITH CONTRAST TECHNIQUE: Multidetector CT imaging of the  abdomen and pelvis was performed using the standard protocol following bolus administration of intravenous contrast. CONTRAST:  161mL ISOVUE-300 IOPAMIDOL (ISOVUE-300) INJECTION 61% COMPARISON:  08/30/2015 FINDINGS: Lower chest:  No acute findings. Hepatobiliary: No masses or other significant abnormality. Gallbladder is unremarkable. Pancreas: Increased prominence of pancreatic head and uncinate process seen with mild hazy density in adjacent peripancreatic fat. This raises suspicion for focal pancreatitis. There is no evidence of solid pancreatic mass or pancreatic ductal dilatation. No evidence of pancreatic necrosis or peripancreatic fluid collections. Spleen: Within normal limits in size and appearance. Adrenals/Urinary Tract: No masses identified. Several tiny left renal cysts noted. No evidence of hydronephrosis. Stomach/Bowel: No evidence of obstruction, inflammatory process, or abnormal fluid collections. Vascular/Lymphatic: No pathologically enlarged lymph nodes. No evidence of abdominal aortic aneurysm. Aortic atherosclerosis noted. Reproductive: No mass or other significant abnormality. Other: Small right inguinal hernia again seen containing a small portion of the urinary bladder. No evidence of herniated bowel loops. Musculoskeletal:  No suspicious bone lesions identified. IMPRESSION: Increased prominence of pancreatic head and uncinate process with hazy density in adjacent fat, suspicious for mild focal pancreatitis. Recommend correlation with serum amylase and lipase levels. Stable small right inguinal hernia containing a small portion of the urinary bladder. Aortic atherosclerosis noted. Electronically Signed   By: Earle Gell M.D.   On: 09/02/2015 19:17    Impression:  Pleasant 75 year old gentleman admitted with biliary pancreatitis which has, clinically, resolved.             Cholelithiasis/ chronic cholecystitis.   Patient has significant persisting cholestasis which is likely extrahepatic  in origin being produced by   residual calculi in the bile duct.  Bilirubin has not plummeted as would be expected with complete   stone passage.   He does not have signs /symptoms of cholangitis at this time.  Recommendations:  Advance to low-fat diet over the weekend. Continue antibiotics per Dr. Arnoldo Morale.    Moved towards ERCP July 17 and eventual cholecystectomy.  The risks, benefits, limitations,    alternatives, and imponderable have been reviewed with the patient. I specifically discussed a1 in    10 chance of pancreatitis, reaction to medications, bleeding, perforation and the possibility of a    failed ERCP. Potential for sphincterotomy and stent placement also reviewed. Questions have    been answered. Patient agreeable.

## 2015-09-04 NOTE — Progress Notes (Signed)
Patient loss IV access, he is receiving IV abx, paged on call MD, will follow any orders received and continue to monitor.

## 2015-09-04 NOTE — Progress Notes (Signed)
PROGRESS NOTE    Elijah [name]  [id number] DOB: 02-22-1940 DOA: 09/02/2015 PCP: Wende Neighbors, MD  Outpatient Specialists:  Orthopedic surgery: Kyra Leyland, MD   Brief Narrative:  73 yom with a Hx of HTN, DM, and multiple hernia surgeries presented with complaints of waxing and waning abd pain with associated Sx of vomiting, fever, chills and no appetite. While in the ED he was noted to have pancreatitis and jaundice. He was admitted for further treatment. GI has been consulted and is following. General surgery consult has been requested.  Assessment & Plan:   Principal Problem:   Pancreatitis Active Problems:   OSA (obstructive sleep apnea)   Chronic low back pain   Parkinson disease (HCC)   Diabetes mellitus without complication (HCC)   Hypertension   Essential hypertension   Jaundice   Elevated LFTs 1. Bilary pancreatitis. Started on Zofran, IV abx, and pain management. US revealed cholelithiasis and gallbladder wall thickening. GI following. Pt is unable to tolerate MRCP. Clinically he appears to be improving. Continue supportive treatment for now. Transaminases improving, and bilirubin appears to have plateaued. Advance diet to low fat. General surgery will plan on cholecystectomy once LFTs have improved 2. Allergic reaction. Patient apparently developed redness and throat swelling overnight. Received one dose of hydrocortisone that resolved symptoms. Unclear if this was related to levaquin or flagyl. Favor levaquin. Will change antibiotics to primaxin. 3. Chronic low back pain. Continue pain management.  4. Parkinson disease. Stable continue on sinemet.   5. DM without complication. Continue to hold metformin Will continue on sliding scale insulin.  6. Essential HTN. BP is stable, Hold diovan and lasix for now.  7. HLD. Hold statin in the setting of elevated LFT.    DVT prophylaxis: lovenox Code Status: Full Family Communication: no family at bedside Disposition Plan:  Home within 2-3 days    Consultants:   Gastroenterology  Gen surgery  Procedures:  None   Antimicrobials:   Flagyl 7/13 >>7/15  Levaquin 7/13 >>7/15  Primaxin 7/15>>   Subjective: Had some redness around neck and shortness of breath develop overnight. Thought to be related to antibiotic. No abdominal pain. No vomiting. Had loose stool last night and one this morning. Tolerating liquids   Objective: Filed Vitals:   09/03/15 1940 09/03/15 2057 09/04/15 0057 09/04/15 0500  BP:  139/71  138/69  Pulse:  69  58  Temp:  98.9 F (37.2 C) 98.5 F (36.9 C) 97.4 F (36.3 C)  TempSrc:  Oral  Oral  Resp:  20  20  Height:    5\' 10"  (1.778 m)  Weight:    107.82 kg (237 lb 11.2 oz)  SpO2: 95% 96%  97%    Intake/Output Summary (Last 24 hours) at 09/04/15 0818 Last data filed at 09/04/15 0300  Gross per 24 hour  Intake 1261.25 ml  Output    700 ml  Net 561.25 ml   Filed Weights   09/02/15 1654 09/02/15 2218 09/04/15 0500  Weight: 104.327 kg (230 lb) 105.779 kg (233 lb 3.2 oz) 107.82 kg (237 lb 11.2 oz)   Examination:  General exam: Appears calm and comfortable  Respiratory system: Clear to auscultation. Respiratory effort normal. Cardiovascular system: S1 & S2 heard, RRR. No JVD, murmurs, rubs, gallops or clicks. No pedal edema. Gastrointestinal system: Abdomen is nondistended, soft and nontender. No organomegaly or masses felt. Normal bowel sounds heard. Central nervous system: Alert and oriented. No focal neurological deficits. Extremities: Symmetric 5 x 5 power. Skin:  No rashes, lesions or ulcers Psychiatry: Judgement and insight appear normal. Mood & affect appropriate.   Data Reviewed: I have personally reviewed following labs and imaging studies  CBC:  Recent Labs Lab 08/30/15 0015 09/02/15 1732 09/03/15 0505 09/04/15 0533  WBC 7.6 9.1 8.3 5.8  NEUTROABS  --  7.9*  --   --   HGB 13.2 12.5* 12.0* 12.4*  HCT 38.3* 35.7* 34.9* 36.4*  MCV 86.1 84.6 85.7  86.1  PLT 210 189 182 0000000   Basic Metabolic Panel:  Recent Labs Lab 08/30/15 0015 09/02/15 1732 09/03/15 0505 09/04/15 0533  NA 136 132* 136 136  K 3.7 3.8 3.9 4.3  CL 105 101 101 103  CO2 25 24 25 22   GLUCOSE 230* 171* 130* 168*  BUN 20 19 14 13   CREATININE 1.04 0.88 0.72 0.66  CALCIUM 8.6* 8.5* 8.4* 8.4*   GFR: Estimated Creatinine Clearance: 102.6 mL/min (by C-G formula based on Cr of 0.66). Liver Function Tests:  Recent Labs Lab 08/30/15 0015 09/02/15 1732 09/03/15 0505 09/04/15 0533  AST 70* 55* 47* 42*  ALT 31 15* 73* 8*  ALKPHOS 121 168* 166* 189*  BILITOT 1.0 5.7* 7.6*  7.7* 7.6*  PROT 7.0 6.8 6.3* 6.4*  ALBUMIN 4.2 3.6 3.2* 3.1*    Recent Labs Lab 08/30/15 0015 09/02/15 1732  LIPASE 39 383*   No results for input(s): AMMONIA in the last 168 hours. Coagulation Profile:  Recent Labs Lab 09/03/15 0505  INR 1.38   Cardiac Enzymes: No results for input(s): CKTOTAL, CKMB, CKMBINDEX, TROPONINI in the last 168 hours. BNP (last 3 results) No results for input(s): PROBNP in the last 8760 hours. HbA1C: No results for input(s): HGBA1C in the last 72 hours. CBG:  Recent Labs Lab 09/03/15 1140 09/03/15 1311 09/03/15 1633 09/03/15 2033 09/04/15 0755  GLUCAP 91 87 115* 155* 149*   Lipid Profile: No results for input(s): CHOL, HDL, LDLCALC, TRIG, CHOLHDL, LDLDIRECT in the last 72 hours. Thyroid Function Tests: No results for input(s): TSH, T4TOTAL, FREET4, T3FREE, THYROIDAB in the last 72 hours. Anemia Panel: No results for input(s): VITAMINB12, FOLATE, FERRITIN, TIBC, IRON, RETICCTPCT in the last 72 hours. Urine analysis:    Component Value Date/Time   COLORURINE YELLOW 09/02/2015 2018   APPEARANCEUR CLEAR 09/02/2015 2018   LABSPEC <1.005* 09/02/2015 2018   PHURINE 6.5 09/02/2015 2018   GLUCOSEU NEGATIVE 09/02/2015 2018   HGBUR NEGATIVE 09/02/2015 2018   BILIRUBINUR MODERATE* 09/02/2015 2018   KETONESUR NEGATIVE 09/02/2015 2018    PROTEINUR NEGATIVE 09/02/2015 2018   UROBILINOGEN 0.2 10/14/2009 1851   NITRITE NEGATIVE 09/02/2015 2018   LEUKOCYTESUR NEGATIVE 09/02/2015 2018   Sepsis Labs: @LABRCNTIP (procalcitonin:4,lacticidven:4)  ) Recent Results (from the past 240 hour(s))  Culture, blood (Routine X 2) w Reflex to ID Panel     Status: None (Preliminary result)   Collection Time: 09/02/15  6:20 PM  Result Value Ref Range Status   Specimen Description BLOOD RIGHT ARM DRAWN BY RN T.O.  Final   Special Requests BOTTLES DRAWN AEROBIC AND ANAEROBIC 6CC EACH  Final   Culture NO GROWTH 2 DAYS  Final   Report Status PENDING  Incomplete  Culture, blood (Routine X 2) w Reflex to ID Panel     Status: None (Preliminary result)   Collection Time: 09/02/15  6:28 PM  Result Value Ref Range Status   Specimen Description BLOOD LEFT HAND  Final   Special Requests BOTTLES DRAWN AEROBIC AND ANAEROBIC [place]  Final  Culture NO GROWTH 2 DAYS  Final   Report Status PENDING  Incomplete         Radiology Studies: Dg Chest 2 View  09/02/2015  CLINICAL DATA:  Chronic 1 year history of shortness of breath. EXAM: CHEST  2 VIEW COMPARISON:  07/11/2004. FINDINGS: Lateral image is suboptimal as the patient was unable to raise the arms. Patient is rotated to the right on the PA image. Cardiac silhouette mildly enlarged. Thoracic aorta mildly tortuous. Hilar and mediastinal contours otherwise unremarkable. Linear atelectasis or scarring in the right lower lobe. Lungs otherwise clear. No localized airspace consolidation. No pleural effusions. No pneumothorax. Normal pulmonary vascularity. IMPRESSION: Mild cardiomegaly. Linear atelectasis or scarring in the right lower lobe. No acute cardiopulmonary disease otherwise. Electronically Signed   By: Evangeline Dakin M.D.   On: 09/02/2015 19:12   US Abdomen Complete  09/03/2015  CLINICAL DATA:  Jaundice EXAM: ABDOMEN ULTRASOUND COMPLETE COMPARISON:  09/02/2015 FINDINGS: Gallbladder: Multiple  gallstones are noted. Gallbladder wall thickening is noted to 6 mm. Negative sonographic Percell Miller sign is noted. Common bile duct: Diameter: 8 mm. This is likely within normal limits given the patient's age. No biliary ductal dilatation was seen on recent CT examination. Liver: Mild increased echogenicity without definitive focal mass. No biliary ductal dilatation is seen. IVC: No abnormality visualized. Pancreas: Visualized portion unremarkable. Spleen: Size and appearance within normal limits. Right Kidney: Length: 11.4 cm. Echogenicity within normal limits. No mass or hydronephrosis visualized. Left Kidney: Length: 12.5 cm. Echogenicity within normal limits. No mass or hydronephrosis visualized. Abdominal aorta: No aneurysm visualized. Other findings: None. IMPRESSION: Cholelithiasis and gallbladder wall thickening. Mild prominence of the common bile duct although this may be age related. Electronically Signed   By: Inez Catalina M.D.   On: 09/03/2015 09:36   Ct Abdomen Pelvis W Contrast  09/02/2015  CLINICAL DATA:  Right lower quadrant pain with nausea and vomiting for 3 days. Previous appendectomy. EXAM: CT ABDOMEN AND PELVIS WITH CONTRAST TECHNIQUE: Multidetector CT imaging of the abdomen and pelvis was performed using the standard protocol following bolus administration of intravenous contrast. CONTRAST:  154mL ISOVUE-300 IOPAMIDOL (ISOVUE-300) INJECTION 61% COMPARISON:  08/30/2015 FINDINGS: Lower chest:  No acute findings. Hepatobiliary: No masses or other significant abnormality. Gallbladder is unremarkable. Pancreas: Increased prominence of pancreatic head and uncinate process seen with mild hazy density in adjacent peripancreatic fat. This raises suspicion for focal pancreatitis. There is no evidence of solid pancreatic mass or pancreatic ductal dilatation. No evidence of pancreatic necrosis or peripancreatic fluid collections. Spleen: Within normal limits in size and appearance. Adrenals/Urinary Tract:  No masses identified. Several tiny left renal cysts noted. No evidence of hydronephrosis. Stomach/Bowel: No evidence of obstruction, inflammatory process, or abnormal fluid collections. Vascular/Lymphatic: No pathologically enlarged lymph nodes. No evidence of abdominal aortic aneurysm. Aortic atherosclerosis noted. Reproductive: No mass or other significant abnormality. Other: Small right inguinal hernia again seen containing a small portion of the urinary bladder. No evidence of herniated bowel loops. Musculoskeletal:  No suspicious bone lesions identified. IMPRESSION: Increased prominence of pancreatic head and uncinate process with hazy density in adjacent fat, suspicious for mild focal pancreatitis. Recommend correlation with serum amylase and lipase levels. Stable small right inguinal hernia containing a small portion of the urinary bladder. Aortic atherosclerosis noted. Electronically Signed   By: Earle Gell M.D.   On: 09/02/2015 19:17        Scheduled Meds: . allopurinol  100 mg Oral Daily  . carbidopa-levodopa  1 tablet Oral  QID  . gabapentin  100 mg Oral TID  . insulin aspart  0-9 Units Subcutaneous TID WC  . levofloxacin (LEVAQUIN) IV  750 mg Intravenous Q24H  . metronidazole  500 mg Intravenous Q8H  . pantoprazole  40 mg Oral Daily  . pramipexole  1.5 mg Oral TID   Continuous Infusions: . sodium chloride 75 mL/hr at 09/03/15 1633     LOS: 2 days    Time spent: 20 minutes     Kathie Dike, MD [facility] Pager [phone]  If 7PM-7AM, please contact night-coverage [email or web address] Password [facility] 09/04/2015, 8:18 AM

## 2015-09-04 NOTE — Consult Note (Signed)
Reason for Consult: Cholelithiasis, jaundice Referring Physician: Dr. Noreene Filbert is an 75 y.o. male.  HPI: Patient is a 75 year old white male with multiple medical problems who presented to the hospital earlier this week with worsening nausea and abdominal pain. He initially came to the emergency room on 08/30/2015 for workup of abdominal pain, but it resolved. He returned several days later with worsening upper abdominal pain, fever, chills. He was noted initially to have an elevated lipase and elevated bilirubin. He was admitted to the hospital with gallstone pancreatitis. He states currently that he has no significant abdominal pain. He still is having chills in the evening. His urine is a darker yellow. He is tolerating clear liquid diet.  Past Medical History  Diagnosis Date  . Parkinson disease ([place])   . Diabetes mellitus without complication ([place])   . Hypertension   . Gout 10/07/2012  . Obesity   . Degenerative arthritis   . Low back pain   . Dyslipidemia   . Glaucoma   . Rotator cuff tear     Bilateral, no surgery  . OSA (obstructive sleep apnea) 04/10/2014  . Chronic low back pain 03/24/2015    Past Surgical History  Procedure Laterality Date  . Appendectomy    . Hernia repair    . Tonsillectomy    . Shoulder bone spur Left     RESECTION    Family History  Problem Relation Age of Onset  . Stroke Mother   . Diabetes Mother   . Heart Problems Mother   . Parkinsonism Father   . Heart Problems Father     Social History:  reports that he has never smoked. He has never used smokeless tobacco. He reports that he does not drink alcohol or use illicit drugs.  Allergies:  Allergies  Allergen Reactions  . Aspirin Other (See Comments)    Stomach ulcers.  . Penicillins Rash    Has patient had a PCN reaction causing immediate rash, facial/tongue/throat swelling, SOB or lightheadedness with hypotension: no Has patient had a PCN reaction causing severe rash  involving mucus membranes or skin necrosis: No Has patient had a PCN reaction that required hospitalization No Has patient had a PCN reaction occurring within the last 10 years: No If all of the above answers are "NO", then may proceed with Cephalosporin use.     Medications:  Prior to Admission:  Prescriptions prior to admission  Medication Sig Dispense Refill Last Dose  . allopurinol (ZYLOPRIM) 100 MG tablet Take 100 mg by mouth daily.    09/02/2015 at Unknown time  . aspirin 81 MG tablet Take 81 mg by mouth daily.   09/02/2015 at Unknown time  . Azelastine HCl 0.15 % SOLN Place 1 spray into the nose daily as needed.  0 unknown  . carbidopa-levodopa (SINEMET CR) 50-200 MG tablet TAKE ONE TABLET FOUR TIMES A DAY 120 tablet 5 09/02/2015 at Unknown time  . cyclobenzaprine (FLEXERIL) 10 MG tablet Take 1 tablet (10 mg total) by mouth 2 (two) times daily as needed for muscle spasms. 20 tablet 0 09/02/2015 at Unknown time  . furosemide (LASIX) 20 MG tablet Take 1 tablet by mouth 2 (two) times daily as needed for fluid or edema.    unknown at Unknown time  . gabapentin (NEURONTIN) 100 MG capsule Take 1 capsule (100 mg total) by mouth 3 (three) times daily. 90 capsule 3 09/02/2015 at Unknown time  . LORazepam (ATIVAN) 1 MG tablet Take 1 mg by  mouth as needed for anxiety.   09/01/2015 at Unknown time  . meloxicam (MOBIC) 15 MG tablet Take 15 mg by mouth daily as needed for pain.    unknown  . metFORMIN (GLUCOPHAGE) 500 MG tablet Take 500 mg by mouth 2 (two) times daily with a meal.    09/02/2015 at Unknown time  . metoprolol tartrate (LOPRESSOR) 25 MG tablet Take 12.5 mg by mouth daily. 1/2 TABLET DAILY   09/02/2015 at 0830  . pantoprazole (PROTONIX) 40 MG tablet Take 40 mg by mouth daily.  1 09/02/2015 at Unknown time  . potassium chloride SA (K-DUR,KLOR-CON) 20 MEQ tablet Take 1 tablet by mouth daily.    09/02/2015 at Unknown time  . pramipexole (MIRAPEX) 1.5 MG tablet Take 1 tablet (1.5 mg total) by mouth 3  (three) times daily. 90 tablet 5 09/02/2015 at Unknown time  . SALINE MIST SPRAY NA Place 1 spray into the nose as needed.    unknown  . simvastatin (ZOCOR) 40 MG tablet Take 20 mg by mouth at bedtime.    09/01/2015 at Unknown time  . valsartan-hydrochlorothiazide (DIOVAN-HCT) 80-12.5 MG per tablet Take 1 tablet by mouth daily.   09/02/2015 at Unknown time  . ACCU-CHEK AVIVA PLUS test strip   0 Taking   Scheduled: . allopurinol  100 mg Oral Daily  . carbidopa-levodopa  1 tablet Oral QID  . gabapentin  100 mg Oral TID  . insulin aspart  0-9 Units Subcutaneous TID WC  . levofloxacin (LEVAQUIN) IV  750 mg Intravenous Q24H  . metronidazole  500 mg Intravenous Q8H  . pantoprazole  40 mg Oral Daily  . pramipexole  1.5 mg Oral TID    Results for orders placed or performed during the hospital encounter of 09/02/15 (from the past 48 hour(s))  CBC with Differential/Platelet     Status: Abnormal   Collection Time: 09/02/15  5:32 PM  Result Value Ref Range   WBC 9.1 4.0 - 10.5 K/uL   RBC 4.22 4.22 - 5.81 MIL/uL   Hemoglobin 12.5 (L) 13.0 - 17.0 g/dL   HCT 35.7 (L) 39.0 - 52.0 %   MCV 84.6 78.0 - 100.0 fL   MCH 29.6 26.0 - 34.0 pg   MCHC 35.0 30.0 - 36.0 g/dL   RDW 13.4 11.5 - 15.5 %   Platelets 189 150 - 400 K/uL   Neutrophils Relative % 87 %   Neutro Abs 7.9 (H) 1.7 - 7.7 K/uL   Lymphocytes Relative 4 %   Lymphs Abs 0.3 (L) 0.7 - 4.0 K/uL   Monocytes Relative 8 %   Monocytes Absolute 0.8 0.1 - 1.0 K/uL   Eosinophils Relative 1 %   Eosinophils Absolute 0.1 0.0 - 0.7 K/uL   Basophils Relative 0 %   Basophils Absolute 0.0 0.0 - 0.1 K/uL  Comprehensive metabolic panel     Status: Abnormal   Collection Time: 09/02/15  5:32 PM  Result Value Ref Range   Sodium 132 (L) 135 - 145 mmol/L   Potassium 3.8 3.5 - 5.1 mmol/L   Chloride 101 101 - 111 mmol/L   CO2 24 22 - 32 mmol/L   Glucose, Bld 171 (H) 65 - 99 mg/dL   BUN 19 6 - 20 mg/dL   Creatinine, Ser 0.88 0.61 - 1.24 mg/dL   Calcium 8.5 (L)  8.9 - 10.3 mg/dL   Total Protein 6.8 6.5 - 8.1 g/dL   Albumin 3.6 3.5 - 5.0 g/dL   AST 55 (H) 15 -  41 U/L   ALT 15 (L) 17 - 63 U/L   Alkaline Phosphatase 168 (H) 38 - 126 U/L   Total Bilirubin 5.7 (H) 0.3 - 1.2 mg/dL   GFR calc non Af Amer >60 >60 mL/min   GFR calc Af Amer >60 >60 mL/min    Comment: (NOTE) The eGFR has been calculated using the CKD EPI equation. This calculation has not been validated in all clinical situations. eGFR's persistently <60 mL/min signify possible Chronic Kidney Disease.    Anion gap 7 5 - 15  Lipase, blood     Status: Abnormal   Collection Time: 09/02/15  5:32 PM  Result Value Ref Range   Lipase 383 (H) 11 - 51 U/L  I-Stat CG4 Lactic Acid, ED     Status: None   Collection Time: 09/02/15  6:01 PM  Result Value Ref Range   Lactic Acid, Venous 1.73 0.5 - 1.9 mmol/L  Culture, blood (Routine X 2) w Reflex to ID Panel     Status: None (Preliminary result)   Collection Time: 09/02/15  6:20 PM  Result Value Ref Range   Specimen Description BLOOD RIGHT ARM DRAWN BY RN T.O.    Special Requests BOTTLES DRAWN AEROBIC AND ANAEROBIC 6CC EACH    Culture NO GROWTH 2 DAYS    Report Status PENDING   Culture, blood (Routine X 2) w Reflex to ID Panel     Status: None (Preliminary result)   Collection Time: 09/02/15  6:28 PM  Result Value Ref Range   Specimen Description BLOOD LEFT HAND    Special Requests BOTTLES DRAWN AEROBIC AND ANAEROBIC 4CC EACH    Culture NO GROWTH 2 DAYS    Report Status PENDING   Urinalysis, Routine w reflex microscopic (not at [facility])     Status: Abnormal   Collection Time: 09/02/15  8:18 PM  Result Value Ref Range   Color, Urine YELLOW YELLOW   APPearance CLEAR CLEAR   Specific Gravity, Urine <1.005 (L) 1.005 - 1.030   pH 6.5 5.0 - 8.0   Glucose, UA NEGATIVE NEGATIVE mg/dL   Hgb urine dipstick NEGATIVE NEGATIVE   Bilirubin Urine MODERATE (A) NEGATIVE   Ketones, ur NEGATIVE NEGATIVE mg/dL   Protein, ur NEGATIVE NEGATIVE mg/dL    Nitrite NEGATIVE NEGATIVE   Leukocytes, UA NEGATIVE NEGATIVE    Comment: MICROSCOPIC NOT DONE ON URINES WITH NEGATIVE PROTEIN, BLOOD, LEUKOCYTES, NITRITE, OR GLUCOSE <1000 mg/dL.  Glucose, capillary     Status: Abnormal   Collection Time: 09/02/15 10:58 PM  Result Value Ref Range   Glucose-Capillary 130 (H) 65 - 99 mg/dL   Comment 1 Notify RN    Comment 2 Document in Chart   Comprehensive metabolic panel     Status: Abnormal   Collection Time: 09/03/15  5:05 AM  Result Value Ref Range   Sodium 136 135 - 145 mmol/L   Potassium 3.9 3.5 - 5.1 mmol/L   Chloride 101 101 - 111 mmol/L   CO2 25 22 - 32 mmol/L   Glucose, Bld 130 (H) 65 - 99 mg/dL   BUN 14 6 - 20 mg/dL   Creatinine, Ser 0.72 0.61 - 1.24 mg/dL   Calcium 8.4 (L) 8.9 - 10.3 mg/dL   Total Protein 6.3 (L) 6.5 - 8.1 g/dL   Albumin 3.2 (L) 3.5 - 5.0 g/dL   AST 47 (H) 15 - 41 U/L   ALT 73 (H) 17 - 63 U/L   Alkaline Phosphatase 166 (H) 38 - 126 U/L  Total Bilirubin 7.7 (H) 0.3 - 1.2 mg/dL   GFR calc non Af Amer >60 >60 mL/min   GFR calc Af Amer >60 >60 mL/min    Comment: (NOTE) The eGFR has been calculated using the CKD EPI equation. This calculation has not been validated in all clinical situations. eGFR's persistently <60 mL/min signify possible Chronic Kidney Disease.    Anion gap 10 5 - 15  CBC     Status: Abnormal   Collection Time: 09/03/15  5:05 AM  Result Value Ref Range   WBC 8.3 4.0 - 10.5 K/uL   RBC 4.07 (L) 4.22 - 5.81 MIL/uL   Hemoglobin 12.0 (L) 13.0 - 17.0 g/dL   HCT 34.9 (L) 39.0 - 52.0 %   MCV 85.7 78.0 - 100.0 fL   MCH 29.5 26.0 - 34.0 pg   MCHC 34.4 30.0 - 36.0 g/dL   RDW 13.4 11.5 - 15.5 %   Platelets 182 150 - 400 K/uL  Protime-INR     Status: Abnormal   Collection Time: 09/03/15  5:05 AM  Result Value Ref Range   Prothrombin Time 17.1 (H) 11.6 - 15.2 seconds   INR 1.38 0.00 - 1.49  Bilirubin, fractionated(tot/dir/indir)     Status: Abnormal   Collection Time: 09/03/15  5:05 AM  Result Value  Ref Range   Total Bilirubin 7.6 (H) 0.3 - 1.2 mg/dL   Bilirubin, Direct 4.3 (H) 0.1 - 0.5 mg/dL   Indirect Bilirubin 3.3 (H) 0.3 - 0.9 mg/dL  Glucose, capillary     Status: Abnormal   Collection Time: 09/03/15  8:13 AM  Result Value Ref Range   Glucose-Capillary 115 (H) 65 - 99 mg/dL  Glucose, capillary     Status: None   Collection Time: 09/03/15 11:40 AM  Result Value Ref Range   Glucose-Capillary 91 65 - 99 mg/dL  Glucose, capillary     Status: None   Collection Time: 09/03/15  1:11 PM  Result Value Ref Range   Glucose-Capillary 87 65 - 99 mg/dL  Glucose, capillary     Status: Abnormal   Collection Time: 09/03/15  4:33 PM  Result Value Ref Range   Glucose-Capillary 115 (H) 65 - 99 mg/dL   Comment 1 Notify RN   Glucose, capillary     Status: Abnormal   Collection Time: 09/03/15  8:33 PM  Result Value Ref Range   Glucose-Capillary 155 (H) 65 - 99 mg/dL   Comment 1 Notify RN    Comment 2 Document in Chart   Hepatic function panel     Status: Abnormal   Collection Time: 09/04/15  5:33 AM  Result Value Ref Range   Total Protein 6.4 (L) 6.5 - 8.1 g/dL   Albumin 3.1 (L) 3.5 - 5.0 g/dL   AST 42 (H) 15 - 41 U/L   ALT 8 (L) 17 - 63 U/L   Alkaline Phosphatase 189 (H) 38 - 126 U/L   Total Bilirubin 7.6 (H) 0.3 - 1.2 mg/dL   Bilirubin, Direct 5.0 (H) 0.1 - 0.5 mg/dL   Indirect Bilirubin 2.6 (H) 0.3 - 0.9 mg/dL  CBC     Status: Abnormal   Collection Time: 09/04/15  5:33 AM  Result Value Ref Range   WBC 5.8 4.0 - 10.5 K/uL   RBC 4.23 4.22 - 5.81 MIL/uL   Hemoglobin 12.4 (L) 13.0 - 17.0 g/dL   HCT 36.4 (L) 39.0 - 52.0 %   MCV 86.1 78.0 - 100.0 fL   MCH 29.3 26.0 -  34.0 pg   MCHC 34.1 30.0 - 36.0 g/dL   RDW 13.7 11.5 - 15.5 %   Platelets 188 150 - 400 K/uL  Basic metabolic panel     Status: Abnormal   Collection Time: 09/04/15  5:33 AM  Result Value Ref Range   Sodium 136 135 - 145 mmol/L   Potassium 4.3 3.5 - 5.1 mmol/L   Chloride 103 101 - 111 mmol/L   CO2 22 22 - 32  mmol/L   Glucose, Bld 168 (H) 65 - 99 mg/dL   BUN 13 6 - 20 mg/dL   Creatinine, Ser 0.66 0.61 - 1.24 mg/dL   Calcium 8.4 (L) 8.9 - 10.3 mg/dL   GFR calc non Af Amer >60 >60 mL/min   GFR calc Af Amer >60 >60 mL/min    Comment: (NOTE) The eGFR has been calculated using the CKD EPI equation. This calculation has not been validated in all clinical situations. eGFR's persistently <60 mL/min signify possible Chronic Kidney Disease.    Anion gap 11 5 - 15  Glucose, capillary     Status: Abnormal   Collection Time: 09/04/15  7:55 AM  Result Value Ref Range   Glucose-Capillary 149 (H) 65 - 99 mg/dL   Comment 1 Notify RN     Dg Chest 2 View  09/02/2015  CLINICAL DATA:  Chronic 1 year history of shortness of breath. EXAM: CHEST  2 VIEW COMPARISON:  07/11/2004. FINDINGS: Lateral image is suboptimal as the patient was unable to raise the arms. Patient is rotated to the right on the PA image. Cardiac silhouette mildly enlarged. Thoracic aorta mildly tortuous. Hilar and mediastinal contours otherwise unremarkable. Linear atelectasis or scarring in the right lower lobe. Lungs otherwise clear. No localized airspace consolidation. No pleural effusions. No pneumothorax. Normal pulmonary vascularity. IMPRESSION: Mild cardiomegaly. Linear atelectasis or scarring in the right lower lobe. No acute cardiopulmonary disease otherwise. Electronically Signed   By: Evangeline Dakin M.D.   On: 09/02/2015 19:12   US Abdomen Complete  09/03/2015  CLINICAL DATA:  Jaundice EXAM: ABDOMEN ULTRASOUND COMPLETE COMPARISON:  09/02/2015 FINDINGS: Gallbladder: Multiple gallstones are noted. Gallbladder wall thickening is noted to 6 mm. Negative sonographic Percell Miller sign is noted. Common bile duct: Diameter: 8 mm. This is likely within normal limits given the patient's age. No biliary ductal dilatation was seen on recent CT examination. Liver: Mild increased echogenicity without definitive focal mass. No biliary ductal dilatation is  seen. IVC: No abnormality visualized. Pancreas: Visualized portion unremarkable. Spleen: Size and appearance within normal limits. Right Kidney: Length: 11.4 cm. Echogenicity within normal limits. No mass or hydronephrosis visualized. Left Kidney: Length: 12.5 cm. Echogenicity within normal limits. No mass or hydronephrosis visualized. Abdominal aorta: No aneurysm visualized. Other findings: None. IMPRESSION: Cholelithiasis and gallbladder wall thickening. Mild prominence of the common bile duct although this may be age related. Electronically Signed   By: Inez Catalina M.D.   On: 09/03/2015 09:36   Ct Abdomen Pelvis W Contrast  09/02/2015  CLINICAL DATA:  Right lower quadrant pain with nausea and vomiting for 3 days. Previous appendectomy. EXAM: CT ABDOMEN AND PELVIS WITH CONTRAST TECHNIQUE: Multidetector CT imaging of the abdomen and pelvis was performed using the standard protocol following bolus administration of intravenous contrast. CONTRAST:  128m ISOVUE-300 IOPAMIDOL (ISOVUE-300) INJECTION 61% COMPARISON:  08/30/2015 FINDINGS: Lower chest:  No acute findings. Hepatobiliary: No masses or other significant abnormality. Gallbladder is unremarkable. Pancreas: Increased prominence of pancreatic head and uncinate process seen with mild hazy density  in adjacent peripancreatic fat. This raises suspicion for focal pancreatitis. There is no evidence of solid pancreatic mass or pancreatic ductal dilatation. No evidence of pancreatic necrosis or peripancreatic fluid collections. Spleen: Within normal limits in size and appearance. Adrenals/Urinary Tract: No masses identified. Several tiny left renal cysts noted. No evidence of hydronephrosis. Stomach/Bowel: No evidence of obstruction, inflammatory process, or abnormal fluid collections. Vascular/Lymphatic: No pathologically enlarged lymph nodes. No evidence of abdominal aortic aneurysm. Aortic atherosclerosis noted. Reproductive: No mass or other significant  abnormality. Other: Small right inguinal hernia again seen containing a small portion of the urinary bladder. No evidence of herniated bowel loops. Musculoskeletal:  No suspicious bone lesions identified. IMPRESSION: Increased prominence of pancreatic head and uncinate process with hazy density in adjacent fat, suspicious for mild focal pancreatitis. Recommend correlation with serum amylase and lipase levels. Stable small right inguinal hernia containing a small portion of the urinary bladder. Aortic atherosclerosis noted. Electronically Signed   By: Earle Gell M.D.   On: 09/02/2015 19:17    ROS:  Pertinent items noted in HPI and remainder of comprehensive ROS otherwise negative.  Blood pressure 138/69, pulse 58, temperature 97.4 F (36.3 C), temperature source Oral, resp. rate 20, height '5\' 10"'  (1.778 m), weight 107.82 kg (237 lb 11.2 oz), SpO2 97 %. Physical Exam: Pleasant white male in no acute distress. Head is normocephalic, atraumatic. HEENT examination reveals scleral icterus. Neck is supple without lymphadenopathy. Lungs are clear to auscultation with equal breath sounds bilaterally. Heart examination reveals a regular rate and rhythm without S3, S4, murmurs. Abdomen is soft with a well-healed midline surgical scar present. Right inguinal hernia is present. No hepatosplenomegaly, masses, or rigidity are noted. No right upper quadrant abdominal pain is noted.  Assessment/Plan: Impression: Gallstone pancreatitis with persistent jaundice. This is being worked up by GI. Given his multiple previous surgeries, open cholecystectomy would have to be performed. Will need his hyperbilirubinemia addressed prior to surgical intervention. Will follow with you.  Andrea Ferrer A 09/04/2015, 8:41 AM

## 2015-09-05 LAB — COMPREHENSIVE METABOLIC PANEL
ALK PHOS: 235 U/L — AB (ref 38–126)
ALT: 9 U/L — AB (ref 17–63)
ANION GAP: 8 (ref 5–15)
AST: 90 U/L — ABNORMAL HIGH (ref 15–41)
Albumin: 3.1 g/dL — ABNORMAL LOW (ref 3.5–5.0)
BILIRUBIN TOTAL: 6.4 mg/dL — AB (ref 0.3–1.2)
BUN: 13 mg/dL (ref 6–20)
CALCIUM: 8.4 mg/dL — AB (ref 8.9–10.3)
CO2: 26 mmol/L (ref 22–32)
CREATININE: 0.68 mg/dL (ref 0.61–1.24)
Chloride: 102 mmol/L (ref 101–111)
GFR calc non Af Amer: >60 mL/min (ref >60)
GLUCOSE: 153 mg/dL — AB (ref 65–99)
Potassium: 3.8 mmol/L (ref 3.5–5.1)
Sodium: 136 mmol/L (ref 135–145)
TOTAL PROTEIN: 6.4 g/dL — AB (ref 6.5–8.1)

## 2015-09-05 LAB — GLUCOSE, CAPILLARY
GLUCOSE-CAPILLARY: 137 mg/dL — AB (ref 65–99)
GLUCOSE-CAPILLARY: 117 mg/dL — AB (ref 65–99)
Glucose-Capillary: 164 mg/dL — ABNORMAL HIGH (ref 65–99)
Glucose-Capillary: 149 mg/dL — ABNORMAL HIGH (ref 65–99)

## 2015-09-05 MED ORDER — GUAIFENESIN ER 600 MG PO TB12
600.0000 mg | ORAL_TABLET | Freq: Two times a day (BID) | ORAL | Status: DC
  Administered 2015-09-05 – 2015-09-09 (×7): 600 mg via ORAL
  Filled 2015-09-05 (×8): qty 1

## 2015-09-05 MED ORDER — ACYCLOVIR 5 % EX CREA
TOPICAL_CREAM | CUTANEOUS | Status: AC
  Filled 2015-09-05: qty 5

## 2015-09-05 MED ORDER — ACYCLOVIR 5 % EX OINT
TOPICAL_OINTMENT | CUTANEOUS | Status: DC
  Administered 2015-09-05 – 2015-09-06 (×6): via TOPICAL
  Filled 2015-09-05: qty 15

## 2015-09-05 MED ORDER — PROCHLORPERAZINE EDISYLATE 5 MG/ML IJ SOLN
10.0000 mg | Freq: Four times a day (QID) | INTRAMUSCULAR | Status: DC | PRN
  Administered 2015-09-05: 10 mg via INTRAVENOUS
  Filled 2015-09-05: qty 2

## 2015-09-05 NOTE — Progress Notes (Signed)
Patient without abdominal pain. No fever past 24 hours. Total bilirubin remains elevated to 6.4    Vital signs in last 24 hours: Temp:  [98 F (36.7 C)-98.3 F (36.8 C)] 98 F (36.7 C) (07/16 0555) Pulse Rate:  [62-74] 62 (07/16 0555) Resp:  [20] 20 (07/16 0555) BP: (124-138)/(66-76) 138/76 mmHg (07/16 0555) SpO2:  [97 %-99 %] 97 % (07/16 0555) Last BM Date: 09/03/15  Remains jaundiced. General:   Alert,   pleasant and cooperative in NAD, accompanied by spouse Abdomen:  Soft, nontender and nondistended.  Normal bowel sounds, without guarding, and without rebound.  No mass or organomegaly. Extremities:  Without clubbing or edema.    Intake/Output from previous day: 07/15 0701 - 07/16 0700 In: 800 [I.V.:800] Out: -  Intake/Output this shift:    Lab Results:  Recent Labs  09/02/15 1732 09/03/15 0505 09/04/15 0533  WBC 9.1 8.3 5.8  HGB 12.5* 12.0* 12.4*  HCT 35.7* 34.9* 36.4*  PLT 189 182 188   BMET  Recent Labs  09/03/15 0505 09/04/15 0533 09/05/15 0613  NA 136 136 136  K 3.9 4.3 3.8  CL 101 103 102  CO2 25 22 26   GLUCOSE 130* 168* 153*  BUN 14 13 13   CREATININE 0.72 0.66 0.68  CALCIUM 8.4* 8.4* 8.4*   LFT  Recent Labs  09/04/15 0533 09/05/15 0613  PROT 6.4* 6.4*  ALBUMIN 3.1* 3.1*  AST 42* 90*  ALT 8* 9*  ALKPHOS 189* 235*  BILITOT 7.6* 6.4*  BILIDIR 5.0*  --   IBILI 2.6*  --    PT/INR  Recent Labs  09/03/15 0505  LABPROT 17.1*  INR 1.38    Impression:  Biliary pancreatitis resolved. Patient likely has obstructing jaundice secondary to common bile duct stone. No significant decline in bilirubin. Therefore, plan for ERCP with biliary decompression tomorrow.  Risk and benefits again reviewed with patient and spouse. Discussed with Dr. Roderic Palau at the nurses station.  We'll reassess first thing tomorrow morning.

## 2015-09-05 NOTE — Progress Notes (Signed)
Patient had Zofran around 1757, still has c/o of nausea, paged on call MD, will follow any new orders received and continue to monitor the patient.

## 2015-09-05 NOTE — Progress Notes (Signed)
PROGRESS NOTE    Elijah [name]  [id number] DOB: 10-Jun-1940 DOA: 09/02/2015 PCP: Wende Neighbors, MD  Outpatient Specialists:  Orthopedic surgery: Kyra Leyland, MD   Brief Narrative:  87 yom with a Hx of HTN, DM, and multiple hernia surgeries presented with complaints of waxing and waning abd pain with associated Sx of vomiting, fever, chills and no appetite. While in the ED he was noted to have pancreatitis and jaundice. He was admitted for further treatment. GI has been consulted and is following. General surgery has been consulted, input appreciated.  Assessment & Plan:   Principal Problem:   Pancreatitis Active Problems:   OSA (obstructive sleep apnea)   Chronic low back pain   Parkinson disease (HCC)   Diabetes mellitus without complication (HCC)   Hypertension   Essential hypertension   Jaundice   Elevated LFTs   Total bilirubin, elevated   Cholelithiasis 1. Bilary pancreatitis. US revealed cholelithiasis and gallbladder wall thickening.  Pt is unable to tolerate MRCP. Clinically he appears to be improving. Total bili slowly improving. Advance diet to low fat. GI following. General surgery will plan on cholecystectomy once LFTs have improved. Plan is to undergo ERCP tomorrow to further evaluate bile duct. Continue on Zofran, IV abx, and pain management. 2. Allergic reaction. Patient apparently developed redness and throat swelling overnight. Received one dose of hydrocortisone that resolved symptoms. Unclear if this was related to levaquin or flagyl. Favor levaquin. Antibiotics have been changed to Primaxin. No further evidence of reaction. 3. Chronic low back pain. Continue pain management.  4. Parkinson disease. Stable continue on sinemet.   5. DM without complication. Blood sugars currently stable. Continue to hold metformin. Will continue on sliding scale insulin.  6. Essential HTN. BP is stable, Hold diovan and lasix for now.  7. HLD. Hold statin in the setting of  elevated LFT.    DVT prophylaxis: lovenox Code Status: Full Family Communication: Family at bedside. Disposition Plan: Home within 1-2 days    Consultants:   Gastroenterology  Gen surgery  Procedures:  None   Antimicrobials:   Flagyl 7/13 >>7/15  Levaquin 7/13 >>7/15  Primaxin 7/15>>   Subjective: Patient doesn't feel well today. Slept poorly last night. Denies nausea and vomiting. Feels bloated, though he has been having BMs.  Objective: Filed Vitals:   09/04/15 0500 09/04/15 1536 09/04/15 2203 09/05/15 0555  BP: 138/69 124/66 138/67 138/76  Pulse: 58 62 74 62  Temp: 97.4 F (36.3 C) 98 F (36.7 C) 98.3 F (36.8 C) 98 F (36.7 C)  TempSrc: Oral Oral Oral Oral  Resp: 20 20 20 20   Height: 5\' 10"  (1.778 m)     Weight: 107.82 kg (237 lb 11.2 oz)     SpO2: 97% 99% 98% 97%    Intake/Output Summary (Last 24 hours) at 09/05/15 0756 Last data filed at 09/04/15 1700  Gross per 24 hour  Intake    800 ml  Output      0 ml  Net    800 ml   Filed Weights   09/02/15 1654 09/02/15 2218 09/04/15 0500  Weight: 104.327 kg (230 lb) 105.779 kg (233 lb 3.2 oz) 107.82 kg (237 lb 11.2 oz)   Examination:  General exam: Appears calm and comfortable  Respiratory system: Clear to auscultation. Respiratory effort normal. Cardiovascular system: S1 & S2 heard, RRR. No JVD, murmurs, rubs, gallops or clicks. No pedal edema. Gastrointestinal system: Abdomen is nondistended, soft and nontender. No organomegaly or masses felt. Normal bowel  sounds heard. Central nervous system: Alert and oriented. No focal neurological deficits. Extremities: Symmetric 5 x 5 power. Skin: No rashes, lesions or ulcers Psychiatry: Judgement and insight appear normal. Mood & affect appropriate.   Data Reviewed: I have personally reviewed following labs and imaging studies  CBC:  Recent Labs Lab 08/30/15 0015 09/02/15 1732 09/03/15 0505 09/04/15 0533  WBC 7.6 9.1 8.3 5.8  NEUTROABS  --  7.9*  --    --   HGB 13.2 12.5* 12.0* 12.4*  HCT 38.3* 35.7* 34.9* 36.4*  MCV 86.1 84.6 85.7 86.1  PLT 210 189 182 0000000   Basic Metabolic Panel:  Recent Labs Lab 08/30/15 0015 09/02/15 1732 09/03/15 0505 09/04/15 0533 09/05/15 0613  NA 136 132* 136 136 136  K 3.7 3.8 3.9 4.3 3.8  CL 105 101 101 103 102  CO2 25 24 25 22 26   GLUCOSE 230* 171* 130* 168* 153*  BUN 20 19 14 13 13   CREATININE 1.04 0.88 0.72 0.66 0.68  CALCIUM 8.6* 8.5* 8.4* 8.4* 8.4*   GFR: Estimated Creatinine Clearance: 102.6 mL/min (by C-G formula based on Cr of 0.68). Liver Function Tests:  Recent Labs Lab 08/30/15 0015 09/02/15 1732 09/03/15 0505 09/04/15 0533 09/05/15 0613  AST 70* 55* 47* 42* 90*  ALT 31 15* 73* 8* 9*  ALKPHOS 121 168* 166* 189* 235*  BILITOT 1.0 5.7* 7.6*  7.7* 7.6* 6.4*  PROT 7.0 6.8 6.3* 6.4* 6.4*  ALBUMIN 4.2 3.6 3.2* 3.1* 3.1*    Recent Labs Lab 08/30/15 0015 09/02/15 1732  LIPASE 39 383*   Coagulation Profile:  Recent Labs Lab 09/03/15 0505  INR 1.38   CBG:  Recent Labs Lab 09/03/15 2033 09/04/15 0755 09/04/15 1116 09/04/15 1657 09/04/15 2114  GLUCAP 155* 149* 186* 150* 153*   Urine analysis:    Component Value Date/Time   COLORURINE YELLOW 09/02/2015 2018   APPEARANCEUR CLEAR 09/02/2015 2018   LABSPEC <1.005* 09/02/2015 2018   PHURINE 6.5 09/02/2015 2018   GLUCOSEU NEGATIVE 09/02/2015 2018   HGBUR NEGATIVE 09/02/2015 2018   BILIRUBINUR MODERATE* 09/02/2015 2018   KETONESUR NEGATIVE 09/02/2015 2018   PROTEINUR NEGATIVE 09/02/2015 2018   UROBILINOGEN 0.2 10/14/2009 1851   NITRITE NEGATIVE 09/02/2015 2018   LEUKOCYTESUR NEGATIVE 09/02/2015 2018   Sepsis Labs: @LABRCNTIP (procalcitonin:4,lacticidven:4)  ) Recent Results (from the past 240 hour(s))  Culture, blood (Routine X 2) w Reflex to ID Panel     Status: None (Preliminary result)   Collection Time: 09/02/15  6:20 PM  Result Value Ref Range Status   Specimen Description BLOOD RIGHT ARM DRAWN BY  RN T.O.  Final   Special Requests BOTTLES DRAWN AEROBIC AND ANAEROBIC 6CC EACH  Final   Culture NO GROWTH 3 DAYS  Final   Report Status PENDING  Incomplete  Culture, blood (Routine X 2) w Reflex to ID Panel     Status: None (Preliminary result)   Collection Time: 09/02/15  6:28 PM  Result Value Ref Range Status   Specimen Description BLOOD LEFT HAND  Final   Special Requests BOTTLES DRAWN AEROBIC AND ANAEROBIC 4CC EACH  Final   Culture NO GROWTH 3 DAYS  Final   Report Status PENDING  Incomplete    Radiology Studies: US Abdomen Complete  09/03/2015  CLINICAL DATA:  Jaundice EXAM: ABDOMEN ULTRASOUND COMPLETE COMPARISON:  09/02/2015 FINDINGS: Gallbladder: Multiple gallstones are noted. Gallbladder wall thickening is noted to 6 mm. Negative sonographic Percell Miller sign is noted. Common bile duct: Diameter: 8 mm. This is  likely within normal limits given the patient's age. No biliary ductal dilatation was seen on recent CT examination. Liver: Mild increased echogenicity without definitive focal mass. No biliary ductal dilatation is seen. IVC: No abnormality visualized. Pancreas: Visualized portion unremarkable. Spleen: Size and appearance within normal limits. Right Kidney: Length: 11.4 cm. Echogenicity within normal limits. No mass or hydronephrosis visualized. Left Kidney: Length: 12.5 cm. Echogenicity within normal limits. No mass or hydronephrosis visualized. Abdominal aorta: No aneurysm visualized. Other findings: None. IMPRESSION: Cholelithiasis and gallbladder wall thickening. Mild prominence of the common bile duct although this may be age related. Electronically Signed   By: Inez Catalina M.D.   On: 09/03/2015 09:36   Scheduled Meds: . allopurinol  100 mg Oral Daily  . carbidopa-levodopa  1 tablet Oral QID  . enoxaparin (LOVENOX) injection  50 mg Subcutaneous Q24H  . gabapentin  100 mg Oral TID  . imipenem-cilastatin  500 mg Intravenous Q8H  . insulin aspart  0-9 Units Subcutaneous TID WC  .  pantoprazole  40 mg Oral Daily  . pramipexole  1.5 mg Oral TID   Continuous Infusions: . sodium chloride 75 mL/hr at 09/04/15 0829     LOS: 3 days    Time spent: 20 minutes    Kathie Dike, MD [facility] Pager [phone]  If 7PM-7AM, please contact night-coverage [email or web address] Password [email or web address] 09/05/2015, 7:56 AM   By signing my name below, I, Delene Ruffini, attest that this documentation has been prepared under the direction and in the presence of Kathie Dike, MD. Electronically Signed: Delene Ruffini 09/05/2015 2:53pm  I, Dr. Kathie Dike, personally performed the services described in this documentaiton. All medical record entries made by the scribe were at my direction and in my presence. I have reviewed the chart and agree that the record reflects my personal performance and is accurate and complete  Kathie Dike, MD, 09/05/2015 3:04 PM

## 2015-09-06 ENCOUNTER — Encounter (HOSPITAL_COMMUNITY)
Admission: EM | Disposition: A | Discharge status: Home / Self Care | Payer: Self-pay | Source: Home / Self Care | Attending: Internal Medicine

## 2015-09-06 ENCOUNTER — Inpatient Hospital Stay (HOSPITAL_COMMUNITY): Payer: PPO | Admitting: Anesthesiology

## 2015-09-06 ENCOUNTER — Inpatient Hospital Stay (HOSPITAL_COMMUNITY): Payer: PPO

## 2015-09-06 ENCOUNTER — Encounter (HOSPITAL_COMMUNITY): Payer: Self-pay | Admitting: *Deleted

## 2015-09-06 HISTORY — PX: SPHINCTEROTOMY: SHX5544

## 2015-09-06 HISTORY — PX: BALLOON DILATION: SHX5330

## 2015-09-06 HISTORY — PX: ERCP: SHX5425

## 2015-09-06 LAB — COMPREHENSIVE METABOLIC PANEL
ALBUMIN: 2.9 g/dL — AB (ref 3.5–5.0)
ALK PHOS: 233 U/L — AB (ref 38–126)
ALT: 15 U/L — AB (ref 17–63)
AST: 73 U/L — AB (ref 15–41)
Anion gap: 9 (ref 5–15)
BILIRUBIN TOTAL: 6.1 mg/dL — AB (ref 0.3–1.2)
BUN: 12 mg/dL (ref 6–20)
CALCIUM: 8.4 mg/dL — AB (ref 8.9–10.3)
CO2: 23 mmol/L (ref 22–32)
CREATININE: 0.74 mg/dL (ref 0.61–1.24)
Chloride: 107 mmol/L (ref 101–111)
GFR calc Af Amer: >60 mL/min (ref >60)
GFR calc non Af Amer: >60 mL/min (ref >60)
GLUCOSE: 119 mg/dL — AB (ref 65–99)
Potassium: 3.9 mmol/L (ref 3.5–5.1)
Sodium: 139 mmol/L (ref 135–145)
TOTAL PROTEIN: 6 g/dL — AB (ref 6.5–8.1)

## 2015-09-06 LAB — CBC
HCT: 34.6 % — ABNORMAL LOW (ref 39.0–52.0)
Hemoglobin: 11.9 g/dL — ABNORMAL LOW (ref 13.0–17.0)
MCH: 29.2 pg (ref 26.0–34.0)
MCHC: 34.4 g/dL (ref 30.0–36.0)
MCV: 84.8 fL (ref 78.0–100.0)
Platelets: 233 10*3/uL (ref 150–400)
RBC: 4.08 MIL/uL — ABNORMAL LOW (ref 4.22–5.81)
RDW: 13.7 % (ref 11.5–15.5)
WBC: 5 10*3/uL (ref 4.0–10.5)

## 2015-09-06 LAB — GLUCOSE, CAPILLARY
GLUCOSE-CAPILLARY: 112 mg/dL — AB (ref 65–99)
GLUCOSE-CAPILLARY: 109 mg/dL — AB (ref 65–99)
GLUCOSE-CAPILLARY: 96 mg/dL (ref 65–99)
GLUCOSE-CAPILLARY: 134 mg/dL — AB (ref 65–99)
Glucose-Capillary: 101 mg/dL — ABNORMAL HIGH (ref 65–99)

## 2015-09-06 SURGERY — ERCP, WITH INTERVENTION IF INDICATED
Anesthesia: General

## 2015-09-06 MED ORDER — ACYCLOVIR 5 % EX CREA
TOPICAL_CREAM | CUTANEOUS | Status: DC
  Administered 2015-09-06 (×3): via TOPICAL
  Administered 2015-09-08: 1 via TOPICAL
  Administered 2015-09-08 – 2015-09-09 (×3): via TOPICAL
  Administered 2015-09-09: 1 via TOPICAL
  Filled 2015-09-06: qty 5

## 2015-09-06 MED ORDER — SUCCINYLCHOLINE CHLORIDE 20 MG/ML IJ SOLN
INTRAMUSCULAR | Status: AC
  Filled 2015-09-06: qty 1

## 2015-09-06 MED ORDER — GLUCAGON HCL RDNA (DIAGNOSTIC) 1 MG IJ SOLR
INTRAMUSCULAR | Status: AC
  Filled 2015-09-06: qty 2

## 2015-09-06 MED ORDER — GLYCOPYRROLATE 0.2 MG/ML IJ SOLN
INTRAMUSCULAR | Status: AC
  Filled 2015-09-06: qty 1

## 2015-09-06 MED ORDER — LACTATED RINGERS IV SOLN
INTRAVENOUS | Status: DC
  Administered 2015-09-06 (×2): via INTRAVENOUS

## 2015-09-06 MED ORDER — ARTIFICIAL TEARS OP OINT
TOPICAL_OINTMENT | OPHTHALMIC | Status: AC
  Filled 2015-09-06: qty 3.5

## 2015-09-06 MED ORDER — SUCCINYLCHOLINE CHLORIDE 20 MG/ML IJ SOLN
INTRAMUSCULAR | Status: DC | PRN
  Administered 2015-09-06: 140 mg via INTRAVENOUS

## 2015-09-06 MED ORDER — NEOSTIGMINE METHYLSULFATE 10 MG/10ML IV SOLN
INTRAVENOUS | Status: DC | PRN
  Administered 2015-09-06: 4 mg via INTRAVENOUS

## 2015-09-06 MED ORDER — NEOSTIGMINE METHYLSULFATE 10 MG/10ML IV SOLN
INTRAVENOUS | Status: AC
  Filled 2015-09-06: qty 1

## 2015-09-06 MED ORDER — PROPOFOL 10 MG/ML IV BOLUS
INTRAVENOUS | Status: AC
  Filled 2015-09-06: qty 20

## 2015-09-06 MED ORDER — MIDAZOLAM HCL 2 MG/2ML IJ SOLN
INTRAMUSCULAR | Status: AC
  Filled 2015-09-06: qty 2

## 2015-09-06 MED ORDER — FUROSEMIDE 10 MG/ML IJ SOLN
40.0000 mg | Freq: Once | INTRAMUSCULAR | Status: AC
  Administered 2015-09-06: 40 mg via INTRAVENOUS
  Filled 2015-09-06: qty 4

## 2015-09-06 MED ORDER — FENTANYL CITRATE (PF) 100 MCG/2ML IJ SOLN
INTRAMUSCULAR | Status: DC | PRN
  Administered 2015-09-06 (×5): 50 ug via INTRAVENOUS

## 2015-09-06 MED ORDER — LIDOCAINE HCL (CARDIAC) 20 MG/ML IV SOLN
INTRAVENOUS | Status: DC | PRN
  Administered 2015-09-06: 25 mg via INTRAVENOUS

## 2015-09-06 MED ORDER — MIDAZOLAM HCL 2 MG/2ML IJ SOLN
1.0000 mg | INTRAMUSCULAR | Status: DC | PRN
  Administered 2015-09-06: 2 mg via INTRAVENOUS

## 2015-09-06 MED ORDER — FENTANYL CITRATE (PF) 250 MCG/5ML IJ SOLN
INTRAMUSCULAR | Status: AC
  Filled 2015-09-06: qty 5

## 2015-09-06 MED ORDER — PROPOFOL 10 MG/ML IV BOLUS
INTRAVENOUS | Status: DC | PRN
  Administered 2015-09-06: 150 mg via INTRAVENOUS

## 2015-09-06 MED ORDER — LIDOCAINE HCL (PF) 1 % IJ SOLN
INTRAMUSCULAR | Status: AC
  Filled 2015-09-06: qty 5

## 2015-09-06 MED ORDER — IOPAMIDOL (ISOVUE-300) INJECTION 61%
INTRAVENOUS | Status: AC
  Filled 2015-09-06: qty 100

## 2015-09-06 MED ORDER — SODIUM CHLORIDE 0.9 % IV SOLN: INTRAVENOUS | Status: DC

## 2015-09-06 MED ORDER — GLYCOPYRROLATE 0.2 MG/ML IJ SOLN
INTRAMUSCULAR | Status: DC | PRN
  Administered 2015-09-06: 0.2 mg via INTRAVENOUS
  Administered 2015-09-06: 0.6 mg via INTRAVENOUS

## 2015-09-06 MED ORDER — STERILE WATER FOR IRRIGATION IR SOLN
Status: DC | PRN
  Administered 2015-09-06: 1000 mL

## 2015-09-06 MED ORDER — ONDANSETRON HCL 4 MG/2ML IJ SOLN: 4.0000 mg | Freq: Once | INTRAMUSCULAR | Status: DC | PRN

## 2015-09-06 MED ORDER — ROCURONIUM BROMIDE 100 MG/10ML IV SOLN
INTRAVENOUS | Status: DC | PRN
  Administered 2015-09-06: 10 mg via INTRAVENOUS
  Administered 2015-09-06: 15 mg via INTRAVENOUS

## 2015-09-06 MED ORDER — ROCURONIUM BROMIDE 50 MG/5ML IV SOLN
INTRAVENOUS | Status: AC
  Filled 2015-09-06: qty 1

## 2015-09-06 MED ORDER — GLYCOPYRROLATE 0.2 MG/ML IJ SOLN
INTRAMUSCULAR | Status: AC
  Filled 2015-09-06: qty 3

## 2015-09-06 MED ORDER — FENTANYL CITRATE (PF) 100 MCG/2ML IJ SOLN: 25.0000 ug | INTRAMUSCULAR | Status: DC | PRN

## 2015-09-06 MED ORDER — IOPAMIDOL (ISOVUE-300) INJECTION 61%
INTRAVENOUS | Status: DC | PRN
  Administered 2015-09-06: 50 mL via INTRAVENOUS

## 2015-09-06 MED ORDER — ONDANSETRON HCL 4 MG/2ML IJ SOLN
4.0000 mg | Freq: Once | INTRAMUSCULAR | Status: AC
  Administered 2015-09-06: 4 mg via INTRAVENOUS

## 2015-09-06 MED ORDER — IOPAMIDOL (ISOVUE-300) INJECTION 61%
INTRAVENOUS | Status: AC
  Filled 2015-09-06: qty 50

## 2015-09-06 MED ORDER — ALBUTEROL SULFATE HFA 108 (90 BASE) MCG/ACT IN AERS
INHALATION_SPRAY | RESPIRATORY_TRACT | Status: AC
  Filled 2015-09-06: qty 6.7

## 2015-09-06 MED ORDER — ONDANSETRON HCL 4 MG/2ML IJ SOLN
INTRAMUSCULAR | Status: AC
  Filled 2015-09-06: qty 2

## 2015-09-06 NOTE — Anesthesia Postprocedure Evaluation (Signed)
Anesthesia Post Note  Patient: Elijah [name]  Procedure(s) Performed: Procedure(s) (LRB): ENDOSCOPIC RETROGRADE CHOLANGIOPANCREATOGRAPHY (ERCP) (N/A) SPHINCTEROTOMY (N/A) BALLOON DILATION (N/A)  Patient location during evaluation: PACU Anesthesia Type: General Level of consciousness: oriented and awake Pain management: pain level controlled Vital Signs Assessment: post-procedure vital signs reviewed and stable Respiratory status: spontaneous breathing, respiratory function stable and patient connected to face mask oxygen Cardiovascular status: stable Postop Assessment: no signs of nausea or vomiting Anesthetic complications: no    Last Vitals:  Filed Vitals:   09/06/15 1125 09/06/15 1140  BP: 145/76 144/65  Pulse:    Temp:    Resp: 23 22    Last Pain:  Filed Vitals:   09/06/15 1150  PainSc: 0-No pain                 ADAMS, AMY A

## 2015-09-06 NOTE — Progress Notes (Signed)
PROGRESS NOTE    Elijah [name]  [id number] DOB: 22-Sep-1940 DOA: 09/02/2015 PCP: Wende Neighbors, MD  Outpatient Specialists:  Orthopedic surgery: Kyra Leyland, MD   Brief Narrative:  75 yom with a Hx of HTN, DM, and multiple hernia surgeries presented with complaints of waxing and waning abd pain with associated Sx of vomiting, fever, chills and no appetite. While in the ED he was noted to have pancreatitis and jaundice. He was found to have biliary pancreatitis. GI and general surgery following.   Assessment & Plan:   Principal Problem:   Pancreatitis Active Problems:   OSA (obstructive sleep apnea)   Chronic low back pain   Parkinson disease (HCC)   Diabetes mellitus without complication (HCC)   Hypertension   Essential hypertension   Jaundice   Elevated LFTs   Total bilirubin, elevated   Cholelithiasis 1. Bilary pancreatitis. US revealed cholelithiasis and gallbladder wall thickening.  Pt is unable to tolerate MRCP. Clinically he appears to be improving. Total bili slowly improving. GI following. General surgery will plan on cholecystectomy once LFTs have improved. ERCP does not show any retained stone. Timing of cholecystectomy to be determined. Continue on Zofran, IV abx, and pain management. 2. Allergic reaction. Patient apparently developed redness and throat swelling overnight. Received one dose of hydrocortisone that resolved symptoms. Unclear if this was related to levaquin or flagyl. Favor levaquin. Antibiotics have been changed to Primaxin. No further evidence of reaction. 3. Chronic low back pain. Continue pain management.  4. Parkinson disease. Stable, continue on sinemet.   5. DM without complication. Blood sugars currently stable. Continue to hold metformin. Will continue on sliding scale insulin.  6. Essential HTN. BP is stable, Hold diovan and lasix for now.  7. HLD. Hold statin in the setting of elevated LFT.    DVT prophylaxis: lovenox Code Status:  Full Family Communication: Family at bedside.  Disposition Plan: Home within 1-2 days    Consultants:   Gastroenterology  Gen surgery  Procedures: ERCP: Slightly dilated bile duct. No definite common duct   stones seen. Status post biliary sphincterotomy,   sphincterotomy balloon dilation and balloon   dredging of the biliary tree. An element of   papillary stenosis not excluded. Recent passage of   a gallstone is not excluded either. Pancreatic duct   not manipulated/injected, etc. Small duodenal    diverticulum.  Antimicrobials:   Flagyl 7/13 >>7/15  Levaquin 7/13 >>7/15  Primaxin 7/15>>   Subjective: Patient is feeling more short of breath today. Feels that abdomen is more distended. Last bowel movement was yesterday. Has indigestion  Objective: Filed Vitals:   09/05/15 1357 09/05/15 1933 09/05/15 2213 09/06/15 0541  BP: 156/69  139/64 145/80  Pulse: 63  57 75  Temp: 97.6 F (36.4 C)  98.1 F (36.7 C) 98.5 F (36.9 C)  TempSrc: Oral  Oral Oral  Resp: 20  20 20   Height:      Weight:    107.5 kg (236 lb 15.9 oz)  SpO2: 100% 97% 97% 97%    Intake/Output Summary (Last 24 hours) at 09/06/15 0718 Last data filed at 09/06/15 0544  Gross per 24 hour  Intake   1725 ml  Output   2050 ml  Net   -325 ml   Filed Weights   09/02/15 2218 09/04/15 0500 09/06/15 0541  Weight: 105.779 kg (233 lb 3.2 oz) 107.82 kg (237 lb 11.2 oz) 107.5 kg (236 lb 15.9 oz)   Examination:  General exam: Appears calm  and comfortable  Respiratory system: Crackles at bases. Respiratory effort normal. Cardiovascular system: S1 & S2 heard, RRR. No JVD, murmurs, rubs, gallops or clicks. 1+ pedal edema. Gastrointestinal system: Abdomen is distended, soft and nontender. No organomegaly or masses felt. Normal bowel  sounds heard. Central nervous system: Alert and oriented. No focal neurological deficits. Extremities: Symmetric 5 x 5 power. Skin: No rashes, lesions or ulcers Psychiatry: Judgement and insight appear normal. Mood & affect appropriate.   Data Reviewed: I have personally reviewed following labs and imaging studies  CBC:  Recent Labs Lab 09/02/15 1732 09/03/15 0505 09/04/15 0533  WBC 9.1 8.3 5.8  NEUTROABS 7.9*  --   --   HGB 12.5* 12.0* 12.4*  HCT 35.7* 34.9* 36.4*  MCV 84.6 85.7 86.1  PLT 189 182 0000000   Basic Metabolic Panel:  Recent Labs Lab 09/02/15 1732 09/03/15 0505 09/04/15 0533 09/05/15 0613  NA 132* 136 136 136  K 3.8 3.9 4.3 3.8  CL 101 101 103 102  CO2 24 25 22 26   GLUCOSE 171* 130* 168* 153*  BUN 19 14 13 13   CREATININE 0.88 0.72 0.66 0.68  CALCIUM 8.5* 8.4* 8.4* 8.4*   GFR: Estimated Creatinine Clearance: 102.5 mL/min (by C-G formula based on Cr of 0.68). Liver Function Tests:  Recent Labs Lab 09/02/15 1732 09/03/15 0505 09/04/15 0533 09/05/15 0613  AST 55* 47* 42* 90*  ALT 15* 73* 8* 9*  ALKPHOS 168* 166* 189* 235*  BILITOT 5.7* 7.6*  7.7* 7.6* 6.4*  PROT 6.8 6.3* 6.4* 6.4*  ALBUMIN 3.6 3.2* 3.1* 3.1*    Recent Labs Lab 09/02/15 1732  LIPASE 383*   Coagulation Profile:  Recent Labs Lab 09/03/15 0505  INR 1.38   CBG:  Recent Labs Lab 09/04/15 2114 09/05/15 0804 09/05/15 1113 09/05/15 1627 09/05/15 2055  GLUCAP 153* 164* 137* 149* 117*   Urine analysis:    Component Value Date/Time   COLORURINE YELLOW 09/02/2015 2018   APPEARANCEUR CLEAR 09/02/2015 2018   LABSPEC <1.005* 09/02/2015 2018   PHURINE 6.5 09/02/2015 2018   GLUCOSEU NEGATIVE 09/02/2015 2018   HGBUR NEGATIVE 09/02/2015 2018   BILIRUBINUR MODERATE* 09/02/2015 2018   KETONESUR NEGATIVE 09/02/2015 2018   PROTEINUR NEGATIVE 09/02/2015 2018   UROBILINOGEN 0.2 10/14/2009 1851   NITRITE NEGATIVE 09/02/2015 2018   LEUKOCYTESUR NEGATIVE 09/02/2015 2018    Sepsis Labs: @LABRCNTIP (procalcitonin:4,lacticidven:4)  ) Recent Results (from the past 240 hour(s))  Culture, blood (Routine X 2) w Reflex to ID Panel     Status: None (Preliminary result)   Collection Time: 09/02/15  6:20 PM  Result Value Ref Range Status   Specimen Description BLOOD RIGHT ARM DRAWN BY RN T.O.  Final   Special Requests BOTTLES DRAWN AEROBIC AND ANAEROBIC 6CC EACH  Final   Culture NO GROWTH 3 DAYS  Final   Report Status PENDING  Incomplete  Culture, blood (Routine X 2) w Reflex to ID Panel     Status: None (Preliminary result)   Collection Time: 09/02/15  6:28 PM  Result Value Ref Range Status   Specimen Description BLOOD LEFT HAND  Final   Special Requests BOTTLES DRAWN AEROBIC AND ANAEROBIC 4CC EACH  Final   Culture NO GROWTH 3 DAYS  Final   Report Status PENDING  Incomplete    Scheduled Meds: . acyclovir ointment   Topical Q3H  . allopurinol  100 mg Oral Daily  . carbidopa-levodopa  1 tablet Oral QID  . gabapentin  100 mg Oral TID  .  guaiFENesin  600 mg Oral BID  . imipenem-cilastatin  500 mg Intravenous Q8H  . insulin aspart  0-9 Units Subcutaneous TID WC  . pantoprazole  40 mg Oral Daily  . pramipexole  1.5 mg Oral TID   Continuous Infusions: . sodium chloride 75 mL/hr at 09/05/15 2356     LOS: 4 days    Time spent: 20 minutes    Kathie Dike, MD [facility] Pager [phone]  If 7PM-7AM, please contact night-coverage [email or web address] Password [email or web address] 09/06/2015, 7:18 AM

## 2015-09-06 NOTE — Transfer of Care (Signed)
Immediate Anesthesia Transfer of Care Note  Patient: [name]  Procedure(s) Performed: Procedure(s): ENDOSCOPIC RETROGRADE CHOLANGIOPANCREATOGRAPHY (ERCP) (N/A) SPHINCTEROTOMY (N/A) BALLOON DILATION (N/A)  Patient Location: PACU  Anesthesia Type:General  Level of Consciousness: awake, oriented and patient cooperative  Airway & Oxygen Therapy: Patient Spontanous Breathing and Patient connected to face mask oxygen  Post-op Assessment: Report given to RN and Post -op Vital signs reviewed and stable  Post vital signs: Reviewed and stable  Last Vitals:  Filed Vitals:   09/06/15 1125 09/06/15 1140  BP: 145/76 144/65  Pulse:    Temp:    Resp: 23 22    Last Pain:  Filed Vitals:   09/06/15 1150  PainSc: 0-No pain      Patients Stated Pain Goal: 8 ([id number])  Complications: No apparent anesthesia complications

## 2015-09-06 NOTE — Anesthesia Preprocedure Evaluation (Signed)
Anesthesia Evaluation  Patient identified by MRN, date of birth, ID band Patient awake    Reviewed: Allergy & Precautions, NPO status , Patient's Chart, lab work & pertinent test results  Airway Mallampati: II  TM Distance: >3 FB Neck ROM: Limited    Dental  (+) Teeth Intact, Dental Advisory Given   Pulmonary sleep apnea ,    breath sounds clear to auscultation       Cardiovascular hypertension, Pt. on medications  Rhythm:Regular Rate:Normal     Neuro/Psych Parkinson's Dx, LUE tremor    GI/Hepatic GERD  Controlled and Medicated,  Endo/Other  diabetes, Well Controlled, Type 2, Oral Hypoglycemic Agents  Renal/GU      Musculoskeletal   Abdominal   Peds  Hematology   Anesthesia Other Findings   Reproductive/Obstetrics                             Anesthesia Physical Anesthesia Plan  ASA: III  Anesthesia Plan: General   Post-op Pain Management:    Induction: Intravenous, Rapid sequence and Cricoid pressure planned  Airway Management Planned: Oral ETT and Video Laryngoscope Planned  Additional Equipment:   Intra-op Plan:   Post-operative Plan: Extubation in OR  Informed Consent: I have reviewed the patients History and Physical, chart, labs and discussed the procedure including the risks, benefits and alternatives for the proposed anesthesia with the patient or authorized representative who has indicated his/her understanding and acceptance.     Plan Discussed with:   Anesthesia Plan Comments:         Anesthesia Quick Evaluation

## 2015-09-06 NOTE — Op Note (Addendum)
[facility] Patient Name: Elijah [name] Procedure Date: 09/06/2015 12:01 PM MRN: [id number] Date of Birth: [birth year] Attending MD: Norvel Richards , MD CSN: [id number] Age: 75 Admit Type: Inpatient Procedure:                ERCP Indications:              Jaundice, : Recent biliary pancreatitis Providers:                Norvel Richards, MD, Jeanann Lewandowsky. Sharon Seller, RN,                            Purcell Nails. Tina Griffiths, Technician Referring MD:              Medicines:                General Anesthesia Complications:            No immediate complications. Estimated Blood Loss:     Estimated blood loss was minimal. Procedure:                After obtaining informed consent, the scope was                            passed under direct vision. Throughout the                            procedure, the patient's blood pressure, pulse, and                            oxygen saturations were monitored continuously.The                            ERCP was accomplished without difficulty. The                            patient tolerated the procedure well. The                            [id number]) was introduced through the                            mouth, and used to inject contrast into and used to                            locate the major papilla. 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 The ampulla of Vater was readily identified on       the medial wall second portion of the duodenum. It appeared normal. The       scout film was normal. Utilizing the 44 autotome, the ampulla was       approached. Using guidewire palpation, almost instantaneously, deep       biliary cannulation was achieved. Sphinctertome  followed up. A       cholangiogram was performed. The common bile duct appeared to be       slightly dilated at 8-9 mm. I do not see fa illing defect. Subsequently,       I performed a biliary sphincterotomy at the 12:00 position limited to       7-8 mm secondary to a short intramural segment. Subsequently, placed a       CRE balloon across sphincterotomy and dilated it to approximately 8-9 mm       for 1 minute to took it down. This produced a nice, orifice. There was       minimal bleeding with this maneuver. Finally, I railed an extractor       balloon to the confluence. I insufflated it to accommodate the diameter       of the bile duct. Balloon occlusion cholangiogram performed ?3 without       calculi recovered. With this maneuver, there was excellent drainage of       the biliary tree. The pancreatic duct was not injected/anipulated. Impression:               Slightly dilated bile duct. No definite common duct                            stones seen. Status post biliary sphincterotomy,                            sphincterotomy balloon dilation and balloon                            dredging of the biliary tree. An element of                            papillary stenosis not excluded. Recent passage of                            a gallstone is not excluded either. Pancreatic duct                            not manipulated/injected, etc. Small duodenal                            diverticulum. Moderate Sedation:         Recommendation:           - Clear liquid diet today; advance as tolerated;                            repeat hepatic profile in the morning.I have                            discussed findings with Dr. Arnoldo Morale. Timing of                            cholecystectomy to be orchestrated. Procedure Code(s):        --- Professional ---  [id number], Esophagogastroduodenoscopy, flexible,                            transoral; diagnostic, including collection of                             specimen(s) by brushing or washing, when performed                            (separate procedure) Diagnosis Code(s):        --- Professional ---                           R17, Unspecified jaundice CPT copyright 2016 [employer]. All rights reserved. The codes documented in this report are preliminary and upon coder review may  be revised to meet current compliance requirements. Cristopher Estimable. Raquel Racey, MD Norvel Richards, MD 09/06/2015 1:38:59 PM This report has been signed electronically. Number of Addenda: 1 Addendum Number: 1   Addendum Date: 09/06/2015 2:17:47 PM      total fluoroscopy time 2 minutes and 4 seconds Cristopher Estimable. Reggie Bise, MD Norvel Richards, MD 09/06/2015 2:18:07 PM This report has been signed electronically.

## 2015-09-06 NOTE — Anesthesia Procedure Notes (Signed)
Procedure Name: Intubation Date/Time: 09/06/2015 12:07 PM Performed by: Andree Elk, Sherman Donaldson A Pre-anesthesia Checklist: Patient identified, Patient being monitored, Timeout performed, Emergency Drugs available and Suction available Patient Re-evaluated:Patient Re-evaluated prior to inductionOxygen Delivery Method: Circle system utilized Preoxygenation: Pre-oxygenation with 100% oxygen Intubation Type: IV induction, Rapid sequence and Cricoid Pressure applied Laryngoscope Size: Glidescope and 4 Grade View: Grade I Tube type: Oral Tube size: 7.0 mm Number of attempts: 1 Airway Equipment and Method: Stylet Placement Confirmation: ETT inserted through vocal cords under direct vision,  positive ETCO2 and breath sounds checked- equal and bilateral Secured at: 21 cm Tube secured with: Tape Dental Injury: Teeth and Oropharynx as per pre-operative assessment

## 2015-09-07 ENCOUNTER — Telehealth: Payer: Self-pay | Admitting: Nurse Practitioner

## 2015-09-07 DIAGNOSIS — K8011 Calculus of gallbladder with chronic cholecystitis with obstruction: Secondary | ICD-10-CM

## 2015-09-07 DIAGNOSIS — K8050 Calculus of bile duct without cholangitis or cholecystitis without obstruction: Secondary | ICD-10-CM | POA: Insufficient documentation

## 2015-09-07 DIAGNOSIS — R945 Abnormal results of liver function studies: Principal | ICD-10-CM

## 2015-09-07 DIAGNOSIS — K859 Acute pancreatitis without necrosis or infection, unspecified: Secondary | ICD-10-CM | POA: Insufficient documentation

## 2015-09-07 DIAGNOSIS — R7989 Other specified abnormal findings of blood chemistry: Secondary | ICD-10-CM

## 2015-09-07 LAB — COMPREHENSIVE METABOLIC PANEL
ALT: 8 U/L — ABNORMAL LOW (ref 17–63)
ANION GAP: 6 (ref 5–15)
AST: 93 U/L — ABNORMAL HIGH (ref 15–41)
Albumin: 2.9 g/dL — ABNORMAL LOW (ref 3.5–5.0)
Alkaline Phosphatase: 259 U/L — ABNORMAL HIGH (ref 38–126)
BILIRUBIN TOTAL: 9.8 mg/dL — AB (ref 0.3–1.2)
BUN: 15 mg/dL (ref 6–20)
CO2: 28 mmol/L (ref 22–32)
Calcium: 8.3 mg/dL — ABNORMAL LOW (ref 8.9–10.3)
Chloride: 101 mmol/L (ref 101–111)
Creatinine, Ser: 0.69 mg/dL (ref 0.61–1.24)
Glucose, Bld: 120 mg/dL — ABNORMAL HIGH (ref 65–99)
POTASSIUM: 3.6 mmol/L (ref 3.5–5.1)
Sodium: 135 mmol/L (ref 135–145)
TOTAL PROTEIN: 6.2 g/dL — AB (ref 6.5–8.1)

## 2015-09-07 LAB — CULTURE, BLOOD (ROUTINE X 2)
CULTURE: NO GROWTH
Culture: NO GROWTH

## 2015-09-07 LAB — GLUCOSE, CAPILLARY
GLUCOSE-CAPILLARY: 102 mg/dL — AB (ref 65–99)
GLUCOSE-CAPILLARY: 155 mg/dL — AB (ref 65–99)
GLUCOSE-CAPILLARY: 114 mg/dL — AB (ref 65–99)
Glucose-Capillary: 115 mg/dL — ABNORMAL HIGH (ref 65–99)

## 2015-09-07 LAB — LIPASE, BLOOD: Lipase: 30 U/L (ref 11–51)

## 2015-09-07 MED ORDER — PANTOPRAZOLE SODIUM 40 MG PO TBEC
40.0000 mg | DELAYED_RELEASE_TABLET | Freq: Every day | ORAL | Status: DC
  Administered 2015-09-08 – 2015-09-09 (×2): 40 mg via ORAL
  Filled 2015-09-07 (×2): qty 1

## 2015-09-07 MED ORDER — FUROSEMIDE 20 MG PO TABS
20.0000 mg | ORAL_TABLET | Freq: Every day | ORAL | Status: DC
  Administered 2015-09-08 – 2015-09-09 (×2): 20 mg via ORAL
  Filled 2015-09-07 (×2): qty 1

## 2015-09-07 MED ORDER — MAGNESIUM CITRATE PO SOLN
1.0000 | Freq: Once | ORAL | Status: AC
  Administered 2015-09-07: 1 via ORAL
  Filled 2015-09-07: qty 296

## 2015-09-07 NOTE — Progress Notes (Signed)
Yesterday's findings at ERCP discussed with Dr. Gala Romney.  Liver tests from today noted. As patient will need an open cholecystectomy requiring 3-5 day postoperative stay, I would prefer to do this electively as an outpatient. Patient understands and agrees.

## 2015-09-07 NOTE — Evaluation (Signed)
Physical Therapy Evaluation Patient Details Name: FAMOUS SPELLER MRN: [id number] DOB: September 12, 1940 Today's Date: 09/07/2015   History of Present Illness  75 yo M admitted 09/02/2015 with abdominal pain, chills, and fever. 09/03/2015 had an allergic reactin to levaquin or flagyl (most likely Levaquin) and resolved with hydrocortisone. Dx: pancreatitis and jaundice. S/P ERCP on 09/06/2015. PMH: multiple hernia surgeries, parkinsons, DM, HTN, obesity, degenerative arthritis, LBP, glaucoma, rotator cuff tear, OSA, appendectomy, hernia repair, shoulder surgery for bone spur resection.  Clinical Impression  Pt received in bed, and was agreeable to PT evaluation.  Pt expressed that he occasionally uses a cane at home, and occasionally has help from his wife for dressing due to rotator cuff issues.  But he is still working, and driving.  Today during PT evaluation he required Min A for bed mobility, sit<>stand transfer, and Min guard for gait x 222ft with RW.  Pt demonstrates a very shuffled and flexed forward gait pattern, which is seen many times in Parkinson patients.  He also demonstrates extremely slowed gait speed at 0.59ft/sec which is indicative of high risk for falling.  At this point, he is recommended for further OPPT to focus on balance deficits from Parkinsons.     Follow Up Recommendations Outpatient PT;Other (comment) (For Parkinsons and balance)    Equipment Recommendations  None recommended by PT    Recommendations for Other Services       Precautions / Restrictions Restrictions Weight Bearing Restrictions: No      Mobility  Bed Mobility Overal bed mobility: Needs Assistance Bed Mobility: Supine to Sit;Sit to Supine     Supine to sit: HOB elevated;Min assist Sit to supine: Min assist   General bed mobility comments: supine<>sit with Min A at trunk.  Sit<>supine with Min A for technique with log roll.  Pt states he normally gets in bed going forward, on one knee, and then  rotates around.    Transfers Overall transfer level: Needs assistance Equipment used: Rolling walker (2 wheeled) Transfers: Sit to/from Stand Sit to Stand: Min assist         General transfer comment: vc's to get to the edge of the bed, and for anterior weight shift (nose over toes), vc's to use hands to push from the bed.    Ambulation/Gait Ambulation/Gait assistance: Min guard Ambulation Distance (Feet): 200 Feet Assistive device: Rolling walker (2 wheeled) Gait Pattern/deviations: Shuffle;Decreased step length - right;Decreased step length - left;Trunk flexed;Narrow base of support Gait velocity: it took the pt 28.7 seconds to ambulate 44ft with RW and Min guard.  Which makes his gait veolcity 0.37ft/sec.  Normal gait speed for this pt's age range is 3.7ft/sec.  This is indicative for a high risk for falling.  Gait velocity interpretation: <1.8 ft/sec, indicative of risk for recurrent falls General Gait Details: Pt demonstrates slowed cadence  Stairs            Wheelchair Mobility    Modified Rankin (Stroke Patients Only)       Balance Overall balance assessment: Needs assistance         Standing balance support: Bilateral upper extremity supported Standing balance-Leahy Scale: Fair                               Pertinent Vitals/Pain Pain Assessment: No/denies pain    Home Living   Living Arrangements: Spouse/significant other (home most of the time, also have grandkids over often.  )  Type of Home: House Home Access: Stairs to enter   [employer] of Steps: 1 step into the carport, and 1 threshold into the house.  Home Layout: One level Home Equipment: Cane - single point;Walker - 4 wheels      Prior Function     Gait / Transfers Assistance Needed: Pt states that he would occasinally use the cane for ambulation  ADL's / Homemaking Assistance Needed: pt was independent with dressing and bathing.  Wife occasionally assists  with getting shirt tucked in behind, as well as getting shoes/socks on.   Comments: Pt states that he is still working for a [employer].  He states that he is constantly moving.  He states he works about 10 hours a day.      Hand Dominance   Dominant Hand: Left    Extremity/Trunk Assessment   Upper Extremity Assessment: RUE deficits/detail RUE Deficits / Details: AROM is limited due to previous rotator cuff injury     LUE Deficits / Details: tremor noted to be greatest in L UE.    Lower Extremity Assessment: Generalized weakness      Cervical / Trunk Assessment: Kyphotic  Communication   Communication: No difficulties  Cognition Arousal/Alertness: Awake/alert Behavior During Therapy: WFL for tasks assessed/performed Overall Cognitive Status: Within Functional Limits for tasks assessed                      General Comments      Exercises        Assessment/Plan    PT Assessment Patient needs continued PT services  PT Diagnosis Difficulty walking;Abnormality of gait;Generalized weakness   PT Problem List Decreased strength;Decreased range of motion;Decreased activity tolerance;Decreased balance;Decreased mobility;Decreased coordination;Decreased knowledge of use of DME;Decreased safety awareness;Decreased knowledge of precautions;Impaired tone;Obesity  PT Treatment Interventions DME instruction;Gait training;Stair training;Functional mobility training;Therapeutic activities;Therapeutic exercise;Balance training;Neuromuscular re-education;Patient/family education   PT Goals (Current goals can be found in the Care Plan section) Acute Rehab PT Goals Patient Stated Goal: Pt wants to go home and get back to work.  PT Goal Formulation: With patient Time For Goal Achievement: 09/14/15 Potential to Achieve Goals: Good    Frequency Min 3X/week   Barriers to discharge        Co-evaluation               End of Session Equipment Utilized During  Treatment: Gait belt Activity Tolerance: Patient tolerated treatment well Patient left: in bed;with call bell/phone within reach (pt stated he had been up in the bed the majority of the morning.  ) Nurse Communication: Mobility status         [id number] PT Time Calculation (min) (ACUTE ONLY): 29 min   Charges:   PT Evaluation $PT Eval Low Complexity: 1 Procedure PT Treatments $Gait Training: 8-22 mins   PT G Codes:        Beth Jasmin Trumbull, PT, DPT X: [id number]

## 2015-09-07 NOTE — Anesthesia Postprocedure Evaluation (Signed)
Anesthesia Post Note  Patient: Elijah [name]  Procedure(s) Performed: Procedure(s) (LRB): ENDOSCOPIC RETROGRADE CHOLANGIOPANCREATOGRAPHY (ERCP) (N/A) SPHINCTEROTOMY (N/A) BALLOON DILATION (N/A)  Patient location during evaluation: Nursing Unit Anesthesia Type: General Level of consciousness: awake Pain management: satisfactory to patient Respiratory status: spontaneous breathing Cardiovascular status: stable Anesthetic complications: no    Last Vitals:  Filed Vitals:   09/06/15 2120 09/07/15 0615  BP: 138/66 133/62  Pulse: 48 50  Temp: 36.4 C 36.6 C  Resp: 18 18    Last Pain:  Filed Vitals:   09/07/15 0615  PainSc: 0-No pain                 Boots Mcglown

## 2015-09-07 NOTE — Progress Notes (Signed)
PROGRESS NOTE    Elijah [name]  [id number] DOB: February 14, 1941 DOA: 09/02/2015 PCP: Wende Neighbors, MD  Outpatient Specialists:  Orthopedic surgery: Kyra Leyland, MD   Brief Narrative:  63 yom with a Hx of HTN, DM, and multiple hernia surgeries presented with complaints of waxing and waning abd pain with associated Sx of vomiting, fever, chills and no appetite. While in the ED he was noted to have biliary pancreatitis. GI and general surgery following. Patient had ERCP that showed no retained stone. Bilirubin higher today. Repeat labs in AM. If bilirubin trending down, anticipate discharge home.  Assessment & Plan:   Principal Problem:   Pancreatitis Active Problems:   OSA (obstructive sleep apnea)   Chronic low back pain   Parkinson disease (HCC)   Diabetes mellitus without complication (HCC)   Hypertension   Essential hypertension   Jaundice   Elevated LFTs   Total bilirubin, elevated   Cholelithiasis   Pancreatitis, acute   Choledocholithiasis 1. Bilary pancreatitis. US revealed cholelithiasis and gallbladder wall thickening. Liver function tests were elevated on admission. Pt was unable to tolerate MRCP. Patient underwent ERCP which did not show any retained stone in bile duct. Interestingly, patient's bilirubin is higher today than it was yesterday. Will need to continue to monitor patient and repeat labs in am. If continues to rise, will need further work up. Gen surgery also following and plan on performing cholecystectomy as an outpatient. Continue on Zofran, IV abx, and pain management. 2. Allergic reaction. Patient apparently developed redness and throat swelling after receiving levaquin. Received one dose of hydrocortisone that resolved symptoms. Unclear if this was related to levaquin or flagyl. Favor levaquin. Antibiotics have been changed to Primaxin. No further evidence of reaction. 3. Chronic low back pain. Continue pain management.  4. Parkinson disease. Stable,  continue on sinemet.   5. DM without complication. Blood sugars currently stable. Continue to hold metformin. Will continue on sliding scale insulin.  6. Essential HTN. BP is stable, Hold diovan for now.  7. HLD. Hold statin in the setting of elevated LFT.  8. Constipation. Will give one dose of mag citrate    DVT prophylaxis: lovenox Code Status: Full Family Communication: Family at bedside.  Disposition Plan: Home within 1-2 days    Consultants:   Gastroenterology  Gen surgery  Procedures: ERCP: Slightly dilated bile duct. No definite common duct   stones seen. Status post biliary sphincterotomy,   sphincterotomy balloon dilation and balloon   dredging of the biliary tree. An element of   papillary stenosis not excluded. Recent passage of   a gallstone is not excluded either. Pancreatic duct   not manipulated/injected, etc. Small duodenal    diverticulum.  Antimicrobials:   Flagyl 7/13 >>7/15  Levaquin 7/13 >>7/15  Primaxin 7/15>>   Subjective: Shortness of breath improving today. Still feels abdomen is distended. Several days since he had a bowel movement  Objective: Filed Vitals:   09/06/15 1952 09/06/15 2120 09/07/15 0615 09/07/15 1608  BP:  138/66 133/62 157/66  Pulse:  48 50 49  Temp:  97.6 F (36.4 C) 97.9 F (36.6 C) 98.4 F (36.9 C)  TempSrc:  Oral Oral Oral  Resp:  18 18 18   Height:      Weight:   106.59 kg (234 lb 15.8 oz)   SpO2: 93% 97% 98% 98%    Intake/Output Summary (Last 24 hours) at 09/07/15 1912 Last data filed at 09/07/15 1904  Gross per 24 hour  Intake  720 ml  Output   3050 ml  Net  -2330 ml   Filed Weights   09/06/15 0541 09/06/15 1049 09/07/15 0615  Weight: 107.5 kg (236 lb 15.9 oz) 107.049 kg (236 lb) 106.59 kg (234 lb 15.8 oz)    Examination:  General exam: Appears calm and comfortable  Respiratory system: Crackles at bases. Respiratory effort normal. Cardiovascular system: S1 & S2 heard, RRR. No JVD, murmurs, rubs, gallops or clicks. 1+ pedal edema. Gastrointestinal system: Abdomen is distended, soft and nontender. No organomegaly or masses felt. Bowel sounds are sluggish. Central nervous system: Alert and oriented. No focal neurological deficits. Extremities: Symmetric 5 x 5 power. Skin: No rashes, lesions or ulcers Psychiatry: Judgement and insight appear normal. Mood & affect appropriate.   Data Reviewed: I have personally reviewed following labs and imaging studies  CBC:  Recent Labs Lab 09/02/15 1732 09/03/15 0505 09/04/15 0533 09/06/15 0639  WBC 9.1 8.3 5.8 5.0  NEUTROABS 7.9*  --   --   --   HGB 12.5* 12.0* 12.4* 11.9*  HCT 35.7* 34.9* 36.4* 34.6*  MCV 84.6 85.7 86.1 84.8  PLT 189 182 188 0000000   Basic Metabolic Panel:  Recent Labs Lab 09/03/15 0505 09/04/15 0533 09/05/15 0613 09/06/15 0639 09/07/15 0710  NA 136 136 136 139 135  K 3.9 4.3 3.8 3.9 3.6  CL 101 103 102 107 101  CO2 25 22 26 23 28   GLUCOSE 130* 168* 153* 119* 120*  BUN 14 13 13 12 15   CREATININE 0.72 0.66 0.68 0.74 0.69  CALCIUM 8.4* 8.4* 8.4* 8.4* 8.3*   GFR: Estimated Creatinine Clearance: 102 mL/min (by C-G formula based on Cr of 0.69). Liver Function Tests:  Recent Labs Lab 09/03/15 0505 09/04/15 0533 09/05/15 0613 09/06/15 0639 09/07/15 0710  AST 47* 42* 90* 73* 93*  ALT 73* 8* 9* 15* 8*  ALKPHOS 166* 189* 235* 233* 259*  BILITOT 7.6*  7.7* 7.6* 6.4* 6.1* 9.8*  PROT 6.3* 6.4* 6.4* 6.0* 6.2*  ALBUMIN 3.2* 3.1* 3.1* 2.9* 2.9*    Recent Labs Lab 09/02/15 1732 09/07/15 0710  LIPASE 383* 30   Coagulation Profile:  Recent Labs Lab 09/03/15 0505  INR 1.38   CBG:  Recent Labs Lab 09/06/15 1704 09/06/15 1942 09/07/15 0734 09/07/15 1152 09/07/15 1603  GLUCAP 101* 134* 102* 155* 114*    Urine analysis:    Component Value Date/Time   COLORURINE YELLOW 09/02/2015 2018   APPEARANCEUR CLEAR 09/02/2015 2018   LABSPEC <1.005* 09/02/2015 2018   PHURINE 6.5 09/02/2015 2018   GLUCOSEU NEGATIVE 09/02/2015 2018   HGBUR NEGATIVE 09/02/2015 2018   BILIRUBINUR MODERATE* 09/02/2015 2018   KETONESUR NEGATIVE 09/02/2015 2018   PROTEINUR NEGATIVE 09/02/2015 2018   UROBILINOGEN 0.2 10/14/2009 1851   NITRITE NEGATIVE 09/02/2015 2018   LEUKOCYTESUR NEGATIVE 09/02/2015 2018   Sepsis Labs: @LABRCNTIP (procalcitonin:4,lacticidven:4)  ) Recent Results (from the past 240 hour(s))  Culture, blood (Routine X 2) w Reflex to ID Panel     Status: None   Collection Time: 09/02/15  6:20 PM  Result Value Ref Range Status   Specimen Description BLOOD RIGHT ARM DRAWN BY RN T.O.  Final   Special Requests BOTTLES DRAWN AEROBIC AND ANAEROBIC [place]  Final   Culture NO GROWTH 5 DAYS  Final   Report Status 09/07/2015 FINAL  Final  Culture, blood (Routine X 2) w Reflex to ID Panel     Status: None   Collection Time: 09/02/15  6:28 PM  Result Value  Ref Range Status   Specimen Description BLOOD LEFT HAND  Final   Special Requests BOTTLES DRAWN AEROBIC AND ANAEROBIC 4CC EACH  Final   Culture NO GROWTH 5 DAYS  Final   Report Status 09/07/2015 FINAL  Final    Scheduled Meds: . acyclovir cream   Topical Q3H  . allopurinol  100 mg Oral Daily  . carbidopa-levodopa  1 tablet Oral QID  . [START ON 09/08/2015] furosemide  20 mg Oral Daily  . gabapentin  100 mg Oral TID  . guaiFENesin  600 mg Oral BID  . imipenem-cilastatin  500 mg Intravenous Q8H  . insulin aspart  0-9 Units Subcutaneous TID WC  . magnesium citrate  1 Bottle Oral Once  . pantoprazole  40 mg Oral Daily  . pramipexole  1.5 mg Oral TID   Continuous Infusions:     LOS: 5 days    Time spent: 20 minutes    Kathie Dike, MD [facility] Pager [phone]  If 7PM-7AM, please contact  night-coverage [email or web address] Password [facility] 09/07/2015, 7:12 PM

## 2015-09-07 NOTE — Progress Notes (Addendum)
Patient had small result from drinking magnesium citrate. Patient states he has a lot of gas and may try to have another bowel movement at another time. Will continue to monitor patient. Patient had several loose bowel movements during the night. Will continue to monitor patient.

## 2015-09-07 NOTE — Addendum Note (Signed)
Addendum  created 09/07/15 [id number] by Vista Deck, CRNA   Modules edited: Notes Section   Notes Section:  File: [id number]

## 2015-09-07 NOTE — Telephone Encounter (Signed)
Patient appears to be d/c today. Please notify of ordered HFP for Thursday (I just put it in), follow-up OV in 2 weeks.  If he develops fever, chills, worsening pain, intractable N/V go back tot he ER.

## 2015-09-07 NOTE — Progress Notes (Signed)
REVIEWED. PLAN PER DRs.[place].   Subjective: Feels well today. A little weak "but I haven't been eating much since all this started." Some mild residual periumbilical soreness. Denies N/V, other abdominal pain. Last bowel movement this morning which was brown, soft, no hematochezia or melena. No other upper or lower GI symptoms. Seen by surgery and per the patient planned cholecystectomy within about 3 weeks.  Objective: Vital signs in last 24 hours: Temp:  [97.6 F (36.4 C)-97.9 F (36.6 C)] 97.9 F (36.6 C) (07/18 0615) Pulse Rate:  [48-66] 50 (07/18 0615) Resp:  [12-23] 18 (07/18 0615) BP: (104-162)/(57-87) 133/62 mmHg (07/18 0615) SpO2:  [93 %-100 %] 98 % (07/18 0615) Weight:  [234 lb 15.8 oz (106.59 kg)-236 lb (107.049 kg)] 234 lb 15.8 oz (106.59 kg) (07/18 0615) Last BM Date: 09/05/15 General:   Alert and oriented, pleasant Head:  Normocephalic and atraumatic. Eyes:  Scleral icterus Heart:  S1, S2 present, no murmurs noted.  Lungs: Clear to auscultation bilaterally, without wheezing, rales, or rhonchi.  Abdomen:  Bowel sounds present, rounded but soft, non-tender on palpation. No rebound or guarding.  Extremities:  Without edema. Neurologic:  Alert and oriented x4;  grossly normal neurologically. Skin:  Warm and dry, intact without significant lesions.  Psych:  Alert and cooperative. Normal mood and affect.  Intake/Output from previous day: 07/17 0701 - 07/18 0700 In: 43 [P.O.:240; I.V.:250] Out: 2300 [Urine:2300] Intake/Output this shift: Total I/O In: -  Out: 450 [Urine:450]  Lab Results:  Recent Labs  09/06/15 0639  WBC 5.0  HGB 11.9*  HCT 34.6*  PLT 233   BMET  Recent Labs  09/05/15 0613 09/06/15 0639 09/07/15 0710  NA 136 139 135  K 3.8 3.9 3.6  CL 102 107 101  CO2 26 23 28   GLUCOSE 153* 119* 120*  BUN 13 12 15   CREATININE 0.68 0.74 0.69  CALCIUM 8.4* 8.4* 8.3*   LFT  Recent Labs  09/05/15 0613 09/06/15 0639 09/07/15 0710   PROT 6.4* 6.0* 6.2*  ALBUMIN 3.1* 2.9* 2.9*  AST 90* 73* 93*  ALT 9* 15* 8*  ALKPHOS 235* 233* 259*  BILITOT 6.4* 6.1* 9.8*   PT/INR No results for input(s): LABPROT, INR in the last 72 hours. Hepatitis Panel No results for input(s): HEPBSAG, HCVAB, HEPAIGM, HEPBIGM in the last 72 hours.   Studies/Results: Dg Ercp With Sphincterotomy  09/06/2015  EXAM: ERCP TECHNIQUE: Multiple spot images obtained with the fluoroscopic device and submitted for interpretation post-procedure. FLUOROSCOPY TIME:  Radiation Exposure Index (as provided by the fluoroscopic device): If the device does not provide the exposure index: Fluoroscopy Time:  dictate in minutes and seconds Number of Acquired Images: COMPARISON:  None. IMPRESSION: These images were submitted for radiologic interpretation only. Please see the procedural report for the amount of contrast and the fluoroscopy time utilized. Electronically Signed   By: Marybelle Killings M.D.   On: 09/06/2015 13:15    Assessment: 75 year old male with pancreatitis per labs and imaging, admitted 09/03/15 NPO on fluids and antibiotics. Abdominal U/S ordered which noted cholelithiasis and gallbladder wall thickening with mild prominence of the CBD. Symptomatically improved over the weekend but remained jaundiced with scleral icterus. Presentation seems indicative of gallstone pancreatitis with worsening labs. Patient refused MRCP even in large-bore MRI due to extreme claustrophobia with previously failed MRI attempts with sedation, unable to use propofol due to need to follow breathing commands.  Bilirubin remained elevated over the weekend and the patient was taken  for ERCP yesterday which noted slightly dilated bile duct, no definite common duct stones seen, s/p biliary sphincterotomy, sphincterotomy balloon dilation and balloon dredging of the biliary tree. "An element of papillary stenosis not excluded, recent passage of a gallstone not excluded either." Returned to the  floor on clear liquid diet, repeat labs in the am. Case discussed with surgery for likely needed cholecystectomy in the near future, timing to be determined.  Today he is symptomatically doing well. Concern for bump in bilirubin. Radiology addendum to ERCP fluroscopy images noted irregularity of the distal CBD and pancreatic duct at insertion to CBD may simply be related to edema but underlying lesion not excluded. Also possibly increase in bili due to lag s/p stone passage with backflow pressure gradient with ERCP.  No fever, chills. Plans to see surg as outpatient. Discussed with Dr. Gala Romney and could plan for stent if needed, will continue to monitor.   Plan: 1. Follow liver labs 2. Discharge summary noted on chart, will plan HFP as outpatient in 2 days 3. 2 week outpatient follow-up in our office to trentd LFTs 4. If develops fever, chills, worsening symptoms: go to ER 5. Our office will be in contact with appointment date/time   Walden Field, AGNP-C Adult & Gerontological Nurse Practitioner [facility]   LOS: 5 days    09/07/2015, 8:25 AM

## 2015-09-07 NOTE — Progress Notes (Signed)
Discussed cholangiogram with Dr. Barbie Banner.  He feels there is some vague irregularity involving thedistal common bile duct near insertion of the pancreatic duct. This is nonspecific. No stricture seen. As noted, bile duct easily cannulated. Extracter balloon also pulled through distal bile duct without any significant resistance and duct drained very well. As discussed with Dr. Arnoldo Morale, will follow patient closely.  If total bilirubin doesn't trend downward, further evaluation will be needed.

## 2015-09-08 ENCOUNTER — Encounter: Payer: Self-pay | Admitting: Internal Medicine

## 2015-09-08 ENCOUNTER — Inpatient Hospital Stay (HOSPITAL_COMMUNITY): Payer: PPO

## 2015-09-08 ENCOUNTER — Encounter (HOSPITAL_COMMUNITY)
Admission: EM | Disposition: A | Discharge status: Home / Self Care | Payer: Self-pay | Source: Home / Self Care | Attending: Internal Medicine

## 2015-09-08 ENCOUNTER — Encounter (HOSPITAL_COMMUNITY): Payer: Self-pay | Admitting: *Deleted

## 2015-09-08 ENCOUNTER — Inpatient Hospital Stay (HOSPITAL_COMMUNITY): Payer: PPO | Admitting: Anesthesiology

## 2015-09-08 DIAGNOSIS — K831 Obstruction of bile duct: Secondary | ICD-10-CM | POA: Insufficient documentation

## 2015-09-08 HISTORY — PX: BILIARY STENT PLACEMENT: SHX5538

## 2015-09-08 HISTORY — PX: ERCP: SHX5425

## 2015-09-08 LAB — BILIRUBIN, DIRECT: BILIRUBIN DIRECT: 7.4 mg/dL — AB (ref 0.1–0.5)

## 2015-09-08 LAB — COMPREHENSIVE METABOLIC PANEL
ALT: 13 U/L — AB (ref 17–63)
AST: 100 U/L — ABNORMAL HIGH (ref 15–41)
Albumin: 3 g/dL — ABNORMAL LOW (ref 3.5–5.0)
Alkaline Phosphatase: 321 U/L — ABNORMAL HIGH (ref 38–126)
Anion gap: 7 (ref 5–15)
BUN: 14 mg/dL (ref 6–20)
CALCIUM: 8.5 mg/dL — AB (ref 8.9–10.3)
CHLORIDE: 99 mmol/L — AB (ref 101–111)
CO2: 28 mmol/L (ref 22–32)
CREATININE: 0.62 mg/dL (ref 0.61–1.24)
Glucose, Bld: 130 mg/dL — ABNORMAL HIGH (ref 65–99)
Potassium: 4 mmol/L (ref 3.5–5.1)
Sodium: 134 mmol/L — ABNORMAL LOW (ref 135–145)
Total Bilirubin: 11.3 mg/dL — ABNORMAL HIGH (ref 0.3–1.2)
Total Protein: 6.4 g/dL — ABNORMAL LOW (ref 6.5–8.1)

## 2015-09-08 LAB — GLUCOSE, CAPILLARY
GLUCOSE-CAPILLARY: 124 mg/dL — AB (ref 65–99)
GLUCOSE-CAPILLARY: 101 mg/dL — AB (ref 65–99)
GLUCOSE-CAPILLARY: 172 mg/dL — AB (ref 65–99)
Glucose-Capillary: 131 mg/dL — ABNORMAL HIGH (ref 65–99)
Glucose-Capillary: 103 mg/dL — ABNORMAL HIGH (ref 65–99)
Glucose-Capillary: 93 mg/dL (ref 65–99)

## 2015-09-08 SURGERY — ERCP, WITH INTERVENTION IF INDICATED
Anesthesia: General

## 2015-09-08 MED ORDER — MIDAZOLAM HCL 2 MG/2ML IJ SOLN
INTRAMUSCULAR | Status: AC
  Filled 2015-09-08: qty 2

## 2015-09-08 MED ORDER — TRAMADOL HCL 50 MG PO TABS: 50.0000 mg | ORAL_TABLET | Freq: Four times a day (QID) | ORAL | Status: DC | PRN

## 2015-09-08 MED ORDER — LACTATED RINGERS IV SOLN
INTRAVENOUS | Status: DC | PRN
  Administered 2015-09-08: 15:00:00 via INTRAVENOUS

## 2015-09-08 MED ORDER — METOCLOPRAMIDE HCL 5 MG/ML IJ SOLN
INTRAMUSCULAR | Status: DC | PRN
  Administered 2015-09-08: 10 mg via INTRAVENOUS

## 2015-09-08 MED ORDER — IOPAMIDOL (ISOVUE-300) INJECTION 61%
INTRAVENOUS | Status: AC
  Filled 2015-09-08: qty 50

## 2015-09-08 MED ORDER — SUCCINYLCHOLINE CHLORIDE 20 MG/ML IJ SOLN
INTRAMUSCULAR | Status: DC | PRN
Start: 2015-09-08 — End: 2015-09-08
  Administered 2015-09-08: 150 mg via INTRAVENOUS

## 2015-09-08 MED ORDER — STERILE WATER FOR IRRIGATION IR SOLN
Status: DC | PRN
  Administered 2015-09-08: 1000 mL

## 2015-09-08 MED ORDER — HYDROCORTISONE 1 % EX CREA
TOPICAL_CREAM | Freq: Two times a day (BID) | CUTANEOUS | Status: DC
  Administered 2015-09-08 – 2015-09-09 (×2): 1 via TOPICAL
  Filled 2015-09-08 (×3): qty 1.5

## 2015-09-08 MED ORDER — ONDANSETRON HCL 4 MG/2ML IJ SOLN
INTRAMUSCULAR | Status: DC | PRN
  Administered 2015-09-08: 4 mg via INTRAVENOUS

## 2015-09-08 MED ORDER — ROCURONIUM BROMIDE 100 MG/10ML IV SOLN
INTRAVENOUS | Status: DC | PRN
Start: 2015-09-08 — End: 2015-09-08
  Administered 2015-09-08: 5 mg via INTRAVENOUS

## 2015-09-08 MED ORDER — MIDAZOLAM HCL 5 MG/5ML IJ SOLN
INTRAMUSCULAR | Status: DC | PRN
  Administered 2015-09-08: 2 mg via INTRAVENOUS

## 2015-09-08 MED ORDER — GLUCAGON HCL RDNA (DIAGNOSTIC) 1 MG IJ SOLR
INTRAMUSCULAR | Status: AC
  Filled 2015-09-08: qty 2

## 2015-09-08 MED ORDER — PROPOFOL 10 MG/ML IV BOLUS
INTRAVENOUS | Status: DC | PRN
  Administered 2015-09-08: 150 mg via INTRAVENOUS

## 2015-09-08 MED ORDER — FENTANYL CITRATE (PF) 100 MCG/2ML IJ SOLN
INTRAMUSCULAR | Status: DC | PRN
  Administered 2015-09-08 (×2): 25 ug via INTRAVENOUS

## 2015-09-08 MED ORDER — SIMETHICONE 40 MG/0.6ML PO SUSP
ORAL | Status: DC | PRN
  Administered 2015-09-08: 5 mL via ORAL

## 2015-09-08 MED ORDER — IOPAMIDOL (ISOVUE-300) INJECTION 61%
INTRAVENOUS | Status: DC | PRN
  Administered 2015-09-08: 3 mL via INTRAVENOUS

## 2015-09-08 MED ORDER — FENTANYL CITRATE (PF) 100 MCG/2ML IJ SOLN
INTRAMUSCULAR | Status: AC
  Filled 2015-09-08: qty 2

## 2015-09-08 MED ORDER — SODIUM CHLORIDE 0.9 % IV SOLN: INTRAVENOUS | Status: DC

## 2015-09-08 NOTE — Anesthesia Procedure Notes (Signed)
Procedure Name: Intubation Date/Time: 09/08/2015 3:26 PM Performed by: Charmaine Downs Pre-anesthesia Checklist: Emergency Drugs available, Patient identified, Suction available and Patient being monitored Patient Re-evaluated:Patient Re-evaluated prior to inductionOxygen Delivery Method: Circle system utilized Preoxygenation: Pre-oxygenation with 100% oxygen Intubation Type: IV induction, Rapid sequence and Cricoid Pressure applied Ventilation: Mask ventilation without difficulty Laryngoscope Size: Mac and 3 Grade View: Grade II Tube type: Oral Tube size: 8.0 mm Number of attempts: 1 Airway Equipment and Method: Stylet Placement Confirmation: ETT inserted through vocal cords under direct vision,  breath sounds checked- equal and bilateral and positive ETCO2 Secured at: 22 cm Tube secured with: Tape Dental Injury: Teeth and Oropharynx as per pre-operative assessment  Future Recommendations: Recommend- induction with short-acting agent, and alternative techniques readily available

## 2015-09-08 NOTE — Telephone Encounter (Signed)
Pt is currently an inpatient at [facility]. Please schedule ov

## 2015-09-08 NOTE — Progress Notes (Signed)
Patient resting in no apparent distress with RR and effort WDL. Patient reports no needs at this time. Bed in low and locked position with call bell in reach and side rails up X3.

## 2015-09-08 NOTE — Op Note (Signed)
[facility] Patient Name: Elijah [name] Procedure Date: 09/08/2015 2:09 PM MRN: [id number] Date of Birth: 1940/06/30 Attending MD: Norvel Richards , MD CSN: [id number] Age: 75 Admit Type: Inpatient Procedure:                ERCP with bile duct brushing and biliary stent                            placement Indications:              Jaundice Providers:                Norvel Richards, MD, Janeece Riggers, RN, Shelby Mattocks, Technician Referring MD:              Medicines:                General Anesthesia Complications:            No immediate complications. Total fluoroscopy time                            59 seconds Estimated Blood Loss:     Estimated blood loss: none. Procedure:                Pre-Anesthesia Assessment:                           - Prior to the procedure, a History and Physical                            was performed, and patient medications and                            allergies were reviewed. The patient's tolerance of                            previous anesthesia was also reviewed. The risks                            and benefits of the procedure and the sedation                            options and risks were discussed with the patient.                            All questions were answered, and informed consent                            was obtained. Prior Anticoagulants: The patient has                            taken no previous anticoagulant or antiplatelet                            agents. ASA Grade Assessment:  III - A patient with                            severe systemic disease. After reviewing the risks                            and benefits, the patient was deemed in                            satisfactory condition to undergo the procedure.                           After obtaining informed consent, the scope was                            passed under direct vision. Throughout the             procedure, the patient's blood pressure, pulse, and                            oxygen saturations were monitored continuously. The                            [id number]) scope was introduced through                            the mouth, and used to inject contrast into and                            used to inject contrast into the bile duct. The                            ERCP was accomplished without difficulty. The                            patient tolerated the procedure well. Scope In: 3:43:07 PM Scope Out: 3:59:19 PM Total Procedure Duration: 0 hours 16 minutes 12 seconds  Findings:      The duodenal scope was passed through the mouth down to the level of the       ampulla. Scope was pulled back to the short position 55 cm from the       incisors. Scout film was taken. Evidence of prior sphincterotomy noted.       There was a somewhat fresh blood clot exuding out of the ampullary       orifice. Please see photoss. This was easily moved out of the way with       the 44 autotome. I immediately achieve deep biliary cannulation and       placed a wire deep into the biliary tree under fluoroscopic control. I       injected contrast in the more proximal duct and pulled the autotome back       into the duodenal lumen. There did appear to be transient meniscus       distally a centimeter or 2 above the ampullary orifice. There were some       air bubbles and  subtle irregularity at this level. No tight stricture       seen. Catheters easily passed. Keeping the safety wire deep in the       biliary tree under fluoroscopic control I brushed the distal bile duct       for cytology. Finally, I railed a 10 [place], 5 cm, stent into the distal       biliary tree across the area of question. It was deployed in excellent       position under endoscopic and fluoroscopic control. There was a very       good flow of clear yellow bile after this maneuverwas performed. Again,        pancreatic duct was not injected or manipulated. Impression:               Abnormal distal bile duct of uncertain                            significance. Slightly dilated common bile duct.                            Status post biliary stent placement. Blood clot                            hanging out of the ampullary orifice. I wonder if                            this produced findings on prior cholangiogram and                            has impeded flow of bile. Status post bile duct                            brushing. Moderate Sedation:      Moderate (conscious) sedation was personally administered by an       anesthesia professional. The following parameters were monitored: oxygen       saturation, heart rate, blood pressure, respiratory rate, EKG, adequacy       of pulmonary ventilation, and response to care. Total physician       intraservice time was 27 minutes. Recommendation:           - Advance diet as tolerated. Repeat hepatic profile                            tomorrow morning. Further recommendations to follow. Procedure Code(s):        --- Professional ---                           [id number], Endoscopic retrograde                            cholangiopancreatography (ERCP); diagnostic,                            including collection of specimen(s) by brushing or  washing, when performed (separate procedure) Diagnosis Code(s):        --- Professional ---                           R17, Unspecified jaundice CPT copyright 2016 American [employer] Association. All rights reserved. The codes documented in this report are preliminary and upon coder review may  be revised to meet current compliance requirements. Cristopher Estimable. Amarria Andreasen, MD Norvel Richards, MD 09/08/2015 4:45:27 PM This report has been signed electronically. Number of Addenda: 0

## 2015-09-08 NOTE — Progress Notes (Signed)
PROGRESS NOTE    Elijah [name]  [id number] DOB: [birth year] DOA: 09/02/2015 PCP: Wende Neighbors, MD  Outpatient Specialists:  Orthopedic surgery: Kyra Leyland, MD   Brief Narrative:  56 yom with a Hx of HTN, DM, and multiple hernia surgeries presented with complaints of waxing and waning abd pain with associated Sx of vomiting, fever, chills and no appetite. While in the ED he was noted to have biliary pancreatitis. GI and general surgery following. Patient had ERCP that showed no retained stone. Bilirubin higher today. Repeat labs in AM. If bilirubin trending down, anticipate discharge home.  Assessment & Plan:   Principal Problem:   Pancreatitis Active Problems:   OSA (obstructive sleep apnea)   Chronic low back pain   Parkinson disease (HCC)   Diabetes mellitus without complication (HCC)   Hypertension   Essential hypertension   Jaundice   Elevated LFTs   Total bilirubin, elevated   Cholelithiasis   Pancreatitis, acute   Choledocholithiasis   Biliary obstruction 1. Bilary pancreatitis. US revealed cholelithiasis and gallbladder wall thickening. Liver function tests were elevated on admission. Pt was unable to tolerate MRCP. Patient underwent ERCP 7/17 which did not show any retained stone in bile duct. Gen surgery also following and plan on performing cholecystectomy as an outpatient. Continue on Zofran, IV abx, and pain management. LFT's and bili continues to rise. Pt underwent ERCP with stent placement on 7/19. Findings of slightly dilated CBD with blood clot hanging out of the ampullary orifice. 2. Allergic reaction. Patient apparently developed redness and throat swelling after receiving levaquin. Received one dose of hydrocortisone that resolved symptoms. Unclear if this was related to levaquin or flagyl. Favor levaquin. Antibiotics have been changed to Primaxin. No further evidence of reaction. 3. Chronic low back pain. Continue pain management.  4. Parkinson disease.  Stable, continue on sinemet.   5. DM without complication. Blood sugars currently stable. Continue to hold metformin. Will continue on sliding scale insulin.  6. Essential HTN. BP is stable, Hold diovan for now.  7. HLD. Hold statin in the setting of elevated LFT.  8. Constipation. Improved with mag citrate    DVT prophylaxis: lovenox Code Status: Full Family Communication: Family at bedside.  Disposition Plan: Home within 1-2 days    Consultants:   Gastroenterology  Gen surgery  Procedures: ERCP: Slightly dilated bile duct. No definite common duct   stones seen. Status post biliary sphincterotomy,   sphincterotomy balloon dilation and balloon   dredging of the biliary tree. An element of   papillary stenosis not excluded. Recent passage of   a gallstone is not excluded either. Pancreatic duct   not manipulated/injected, etc. Small duodenal    diverticulum.  Antimicrobials:   Flagyl 7/13 >>7/15  Levaquin 7/13 >>7/15  Primaxin 7/15>>   Subjective: Reports feeling somewhat better today  Objective: Filed Vitals:   09/08/15 1615 09/08/15 1630 09/08/15 1640 09/08/15 1651  BP: 131/87 132/92 142/75 154/107  Pulse: 55 53 54 54  Temp: 97.7 F (36.5 C)   97.7 F (36.5 C)  TempSrc:    Oral  Resp: 20 17 16 18   Height:      Weight:      SpO2: 95% 94% 97% 94%    Intake/Output Summary (Last 24 hours) at 09/08/15 1704 Last data filed at 09/08/15 1605  Gross per 24 hour  Intake   1080 ml  Output    506 ml  Net    574 ml   [employer]  09/07/15 0615 09/08/15 0651 09/08/15 1501  Weight: 106.59 kg (234 lb 15.8 oz) 107.3 kg (236 lb 8.9 oz) 107.049 kg (236 lb)   Examination:  General exam: Appears calm and comfortable  Respiratory system: Crackles at bases. Respiratory effort  normal. Cardiovascular system: S1 & S2 heard, RRR. Gastrointestinal system: Abdomen is distended, soft and nontender. No organomegaly or masses felt. Bowel sounds are sluggish. Central nervous system: Alert and oriented. No focal neurological deficits. Extremities: Symmetric 5 x 5 power. Skin: No rashes, hyperpigmented nodule over mid-lower back without erythema Psychiatry: Judgement and insight appear normal. Mood & affect appropriate.   Data Reviewed: I have personally reviewed following labs and imaging studies  CBC:  Recent Labs Lab 09/02/15 1732 09/03/15 0505 09/04/15 0533 09/06/15 0639  WBC 9.1 8.3 5.8 5.0  NEUTROABS 7.9*  --   --   --   HGB 12.5* 12.0* 12.4* 11.9*  HCT 35.7* 34.9* 36.4* 34.6*  MCV 84.6 85.7 86.1 84.8  PLT 189 182 188 0000000   Basic Metabolic Panel:  Recent Labs Lab 09/04/15 0533 09/05/15 0613 09/06/15 0639 09/07/15 0710 09/08/15 0607  NA 136 136 139 135 134*  K 4.3 3.8 3.9 3.6 4.0  CL 103 102 107 101 99*  CO2 22 26 23 28 28   GLUCOSE 168* 153* 119* 120* 130*  BUN 13 13 12 15 14   CREATININE 0.66 0.68 0.74 0.69 0.62  CALCIUM 8.4* 8.4* 8.4* 8.3* 8.5*   GFR: Estimated Creatinine Clearance: 102.2 mL/min (by C-G formula based on Cr of 0.62). Liver Function Tests:  Recent Labs Lab 09/04/15 0533 09/05/15 0613 09/06/15 0639 09/07/15 0710 09/08/15 0607  AST 42* 90* 73* 93* 100*  ALT 8* 9* 15* 8* 13*  ALKPHOS 189* 235* 233* 259* 321*  BILITOT 7.6* 6.4* 6.1* 9.8* 11.3*  PROT 6.4* 6.4* 6.0* 6.2* 6.4*  ALBUMIN 3.1* 3.1* 2.9* 2.9* 3.0*    Recent Labs Lab 09/02/15 1732 09/07/15 0710  LIPASE 383* 30   Coagulation Profile:  Recent Labs Lab 09/03/15 0505  INR 1.38   CBG:  Recent Labs Lab 09/07/15 2030 09/08/15 0714 09/08/15 1108 09/08/15 1506 09/08/15 1618  GLUCAP 115* 124* 131* 103* 93   Urine analysis:    Component Value Date/Time   COLORURINE YELLOW 09/02/2015 2018   APPEARANCEUR CLEAR 09/02/2015 2018   LABSPEC <1.005*  09/02/2015 2018   PHURINE 6.5 09/02/2015 2018   GLUCOSEU NEGATIVE 09/02/2015 2018   HGBUR NEGATIVE 09/02/2015 2018   BILIRUBINUR MODERATE* 09/02/2015 2018   KETONESUR NEGATIVE 09/02/2015 2018   PROTEINUR NEGATIVE 09/02/2015 2018   UROBILINOGEN 0.2 10/14/2009 1851   NITRITE NEGATIVE 09/02/2015 2018   LEUKOCYTESUR NEGATIVE 09/02/2015 2018   Sepsis Labs: @LABRCNTIP (procalcitonin:4,lacticidven:4)  ) Recent Results (from the past 240 hour(s))  Culture, blood (Routine X 2) w Reflex to ID Panel     Status: None   Collection Time: 09/02/15  6:20 PM  Result Value Ref Range Status   Specimen Description BLOOD RIGHT ARM DRAWN BY RN T.O.  Final   Special Requests BOTTLES DRAWN AEROBIC AND ANAEROBIC [place]  Final   Culture NO GROWTH 5 DAYS  Final   Report Status 09/07/2015 FINAL  Final  Culture, blood (Routine X 2) w Reflex to ID Panel     Status: None   Collection Time: 09/02/15  6:28 PM  Result Value Ref Range Status   Specimen Description BLOOD LEFT HAND  Final   Special Requests BOTTLES DRAWN AEROBIC AND ANAEROBIC [facility] E[facility]  Final  Culture NO GROWTH 5 DAYS  Final   Report Status 09/07/2015 FINAL  Final    Scheduled Meds: . acyclovir cream   Topical Q3H  . allopurinol  100 mg Oral Daily  . carbidopa-levodopa  1 tablet Oral QID  . furosemide  20 mg Oral Daily  . gabapentin  100 mg Oral TID  . guaiFENesin  600 mg Oral BID  . hydrocortisone cream   Topical BID  . imipenem-cilastatin  500 mg Intravenous Q8H  . insulin aspart  0-9 Units Subcutaneous TID WC  . iopamidol      . pantoprazole  40 mg Oral QAC breakfast  . pramipexole  1.5 mg Oral TID   Continuous Infusions:     LOS: 6 days     Kathie Dike, MD [facility] Pager [phone]  If 7PM-7AM, please contact night-coverage [email or web address] Password [email or web address] 09/08/2015, 5:04 PM

## 2015-09-08 NOTE — Telephone Encounter (Signed)
APPT MADE AND LETTER SENT  °

## 2015-09-08 NOTE — Transfer of Care (Signed)
Immediate Anesthesia Transfer of Care Note  Patient: Elijah [name]  Procedure(s) Performed: Procedure(s) with comments: ENDOSCOPIC RETROGRADE CHOLANGIOPANCREATOGRAPHY (ERCP) (N/A) - with bile duct brushings BILIARY STENT PLACEMENT (N/A)  Patient Location: PACU  Anesthesia Type:General  Level of Consciousness: awake and patient cooperative  Airway & Oxygen Therapy: Patient Spontanous Breathing and Patient connected to face mask oxygen  Post-op Assessment: Report given to RN, Post -op Vital signs reviewed and stable and Patient moving all extremities  Post vital signs: Reviewed and stable  Last Vitals:  Filed Vitals:   09/08/15 1501 09/08/15 1510  BP:  141/69  Pulse:    Temp: 36.4 C   Resp: 24 22    Last Pain:  Filed Vitals:   09/08/15 1513  PainSc: 0-No pain      Patients Stated Pain Goal: 8 ([id number])  Complications: No apparent anesthesia complications

## 2015-09-08 NOTE — Progress Notes (Signed)
Patient to undergo ERCP with stent placement today. We'll continue to follow peripherally.

## 2015-09-08 NOTE — Care Management Important Message (Signed)
Important Message  Patient Details  Name: Elijah [name] MRN: [id number] Date of Birth: March 20, 1940   Medicare Important Message Given:  Yes    Alvie Heidelberg, RN 09/08/2015, 8:56 AM

## 2015-09-08 NOTE — Progress Notes (Signed)
Subjective:  Patient had BM yesterday. Some vomiting which he contributes to sinus drainage. Ate breakfast this morning. No abdominal pain.   Objective: Vital signs in last 24 hours: Temp:  [98.4 F (36.9 C)-98.9 F (37.2 C)] 98.5 F (36.9 C) (07/19 0440) Pulse Rate:  [47-55] 47 (07/19 0440) Resp:  [18-20] 20 (07/19 0440) BP: (125-168)/(53-81) 168/81 mmHg (07/19 0440) SpO2:  [95 %-98 %] 98 % (07/19 0440) Weight:  [236 lb 8.9 oz (107.3 kg)] 236 lb 8.9 oz (107.3 kg) (07/19 0651) Last BM Date: 09/07/15 General:   Alert,  Well-developed, well-nourished, pleasant and cooperative in NAD Head:  Normocephalic and atraumatic. Eyes:  Sclera  icterus.  Abdomen:  Soft, nontender and nondistended.    Extremities:  Without clubbing, deformity or edema. Neurologic:  Alert and  oriented x4;  grossly normal neurologically. Skin:  Intact without significant lesions or rashes.+jaundice Psych:  Alert and cooperative. Normal mood and affect.  Intake/Output from previous day: 07/18 0701 - 07/19 0700 In: 720 [P.O.:720] Out: 956 [Urine:955; Stool:1] Intake/Output this shift:    Lab Results: CBC  Recent Labs  09/06/15 0639  WBC 5.0  HGB 11.9*  HCT 34.6*  MCV 84.8  PLT 233   BMET  Recent Labs  09/06/15 0639 09/07/15 0710 09/08/15 0607  NA 139 135 134*  K 3.9 3.6 4.0  CL 107 101 99*  CO2 23 28 28   GLUCOSE 119* 120* 130*  BUN 12 15 14   CREATININE 0.74 0.69 0.62  CALCIUM 8.4* 8.3* 8.5*   LFTs  Recent Labs  09/06/15 0639 09/07/15 0710 09/08/15 0607  BILITOT 6.1* 9.8* 11.3*  BILIDIR  --   --  7.4*  ALKPHOS 233* 259* 321*  AST 73* 93* 100*  ALT 15* 8* 13*  PROT 6.0* 6.2* 6.4*  ALBUMIN 2.9* 2.9* 3.0*    Recent Labs  09/07/15 0710  LIPASE 30   PT/INR No results for input(s): LABPROT, INR in the last 72 hours.    Imaging Studies: Dg Chest 2 View  09/02/2015  CLINICAL DATA:  Chronic 1 year history of shortness of breath. EXAM: CHEST  2 VIEW COMPARISON:  07/11/2004.  FINDINGS: Lateral image is suboptimal as the patient was unable to raise the arms. Patient is rotated to the right on the PA image. Cardiac silhouette mildly enlarged. Thoracic aorta mildly tortuous. Hilar and mediastinal contours otherwise unremarkable. Linear atelectasis or scarring in the right lower lobe. Lungs otherwise clear. No localized airspace consolidation. No pleural effusions. No pneumothorax. Normal pulmonary vascularity. IMPRESSION: Mild cardiomegaly. Linear atelectasis or scarring in the right lower lobe. No acute cardiopulmonary disease otherwise. Electronically Signed   By: Evangeline Dakin M.D.   On: 09/02/2015 19:12   US Abdomen Complete  09/03/2015  CLINICAL DATA:  Jaundice EXAM: ABDOMEN ULTRASOUND COMPLETE COMPARISON:  09/02/2015 FINDINGS: Gallbladder: Multiple gallstones are noted. Gallbladder wall thickening is noted to 6 mm. Negative sonographic Percell Miller sign is noted. Common bile duct: Diameter: 8 mm. This is likely within normal limits given the patient's age. No biliary ductal dilatation was seen on recent CT examination. Liver: Mild increased echogenicity without definitive focal mass. No biliary ductal dilatation is seen. IVC: No abnormality visualized. Pancreas: Visualized portion unremarkable. Spleen: Size and appearance within normal limits. Right Kidney: Length: 11.4 cm. Echogenicity within normal limits. No mass or hydronephrosis visualized. Left Kidney: Length: 12.5 cm. Echogenicity within normal limits. No mass or hydronephrosis visualized. Abdominal aorta: No aneurysm visualized. Other findings: None. IMPRESSION: Cholelithiasis and gallbladder wall thickening. Mild prominence  of the common bile duct although this may be age related. Electronically Signed   By: Inez Catalina M.D.   On: 09/03/2015 09:36   Ct Abdomen Pelvis W Contrast  09/02/2015  CLINICAL DATA:  Right lower quadrant pain with nausea and vomiting for 3 days. Previous appendectomy. EXAM: CT ABDOMEN AND PELVIS  WITH CONTRAST TECHNIQUE: Multidetector CT imaging of the abdomen and pelvis was performed using the standard protocol following bolus administration of intravenous contrast. CONTRAST:  153mL ISOVUE-300 IOPAMIDOL (ISOVUE-300) INJECTION 61% COMPARISON:  08/30/2015 FINDINGS: Lower chest:  No acute findings. Hepatobiliary: No masses or other significant abnormality. Gallbladder is unremarkable. Pancreas: Increased prominence of pancreatic head and uncinate process seen with mild hazy density in adjacent peripancreatic fat. This raises suspicion for focal pancreatitis. There is no evidence of solid pancreatic mass or pancreatic ductal dilatation. No evidence of pancreatic necrosis or peripancreatic fluid collections. Spleen: Within normal limits in size and appearance. Adrenals/Urinary Tract: No masses identified. Several tiny left renal cysts noted. No evidence of hydronephrosis. Stomach/Bowel: No evidence of obstruction, inflammatory process, or abnormal fluid collections. Vascular/Lymphatic: No pathologically enlarged lymph nodes. No evidence of abdominal aortic aneurysm. Aortic atherosclerosis noted. Reproductive: No mass or other significant abnormality. Other: Small right inguinal hernia again seen containing a small portion of the urinary bladder. No evidence of herniated bowel loops. Musculoskeletal:  No suspicious bone lesions identified. IMPRESSION: Increased prominence of pancreatic head and uncinate process with hazy density in adjacent fat, suspicious for mild focal pancreatitis. Recommend correlation with serum amylase and lipase levels. Stable small right inguinal hernia containing a small portion of the urinary bladder. Aortic atherosclerosis noted. Electronically Signed   By: Earle Gell M.D.   On: 09/02/2015 19:17   Dg Ercp With Sphincterotomy  09/07/2015  ADDENDUM REPORT: 09/07/2015 09:32 ADDENDUM: There is irregularity of the distal common bile duct and pancreatic duct at the insertion into the  common bile duct. This may simply be related to edema. Underlying lesion is not excluded. Electronically Signed   By: Marybelle Killings M.D.   On: 09/07/2015 09:32  09/07/2015  ADDENDUM REPORT: 09/07/2015 08:50 Electronically Signed   By: Marybelle Killings M.D.   On: 09/07/2015 08:50  09/07/2015  EXAM: ERCP TECHNIQUE: Multiple spot images obtained with the fluoroscopic device and submitted for interpretation post-procedure. FLUOROSCOPY TIME:  Radiation Exposure Index (as provided by the fluoroscopic device): If the device does not provide the exposure index: Fluoroscopy Time:  dictate in minutes and seconds Number of Acquired Images: COMPARISON:  None. IMPRESSION: These images were submitted for radiologic interpretation only. Please see the procedural report for the amount of contrast and the fluoroscopy time utilized. Electronically Signed: By: Marybelle Killings M.D. On: 09/06/2015 13:15   Ct Renal Stone Study  08/30/2015  CLINICAL DATA:  75 year old male with right lower quadrant abdominal pain. EXAM: CT ABDOMEN AND PELVIS WITHOUT CONTRAST TECHNIQUE: Multidetector CT imaging of the abdomen and pelvis was performed following the standard protocol without IV contrast. COMPARISON:  CT dated 07/02/2012 FINDINGS: Evaluation of this exam is limited in the absence of intravenous contrast. Evaluation is also limited due to streak artifact caused by patient's arms. There is a partially visualized 4 mm nodular density in the right middle lobe (series 2, image 1). The visualized lung bases are otherwise clear. There is coronary vascular calcification. No intra-abdominal free air or free fluid. The liver, gallbladder, pancreas, spleen, adrenal glands, kidneys, visualized ureters, and urinary bladder appear unremarkable. The prostate and seminal vesicles are grossly  unremarkable. Constipation. Doppler sigmoid and scattered colonic diverticula without active inflammatory changes. There is no evidence of bowel obstruction or acute  inflammation. Appendectomy. Mild aortoiliac atherosclerotic disease. No portal venous gas identified. There is no adenopathy. There is a midline vertical anterior pelvic wall incisional scar. There is a small fat containing left inguinal hernia. There is a small right inguinal hernia. There is herniation of a portion of the bladder into the right inguinal canal. There is degenerative changes of the spine. No acute fracture. IMPRESSION: No hydronephrosis or nephrolithiasis. Constipation.  No bowel obstruction or active inflammation. Diverticulosis. Electronically Signed   By: Anner Crete M.D.   On: 08/30/2015 03:18  [2 weeks]   Assessment:  75 year old male with pancreatitis per labs and imaging, admitted 09/03/15. On Abx.  Abdominal U/S ordered which noted cholelithiasis and gallbladder wall thickening with mild prominence of the CBD. Symptomatically improved over the weekend but remained jaundiced with scleral icterus. Presentation seems indicative of gallstone pancreatitis with worsening labs. Patient refused MRCP even in large-bore MRI due to extreme claustrophobia with previously failed MRI attempts with sedation, unable to use propofol due to need to follow breathing commands.  Bilirubin remained elevated over the weekend and the patient was taken for ERCP yesterday which noted slightly dilated bile duct, no definite common duct stones seen, s/p biliary sphincterotomy, sphincterotomy balloon dilation and balloon dredging of the biliary tree. "An element of papillary stenosis not excluded, recent passage of a gallstone not excluded either."   Clinically doing ok. Since ERCP, his bilirubin has nearly doubled over the past 48 hours. Radiology addendum to ERCP fluroscopy images noted irregularity of the distal CBD and pancreatic duct at insertion to CBD may simply be related to edema but underlying lesion not excluded.  No fever, chills. On Abx.   Plan: 1. Discussed with Dr. Gala Romney, patient and  wife, Vaughan Basta. Plan for ERCP with bile duct brushings and biliary plastic stent placement today. Patient ate breakfast therefore will be this afternoon. The risks, benefits, limitations, alternatives, and imponderables have been reviewed with the patient. I specifically discussed a 1 in 10 chance of pancreatitis, reaction to medications, bleeding, perforation and the possibility of a failed ERCP.  Questions have been answered. All parties agreeable.  Laureen Ochs. Bernarda Caffey [facility] [phone] 7/19/20178:53 AM     LOS: 6 days    Attending note:  Agree with above as outlined. His bilirubin has steadily "marched" upward in spite of bile duct sweep and sphincterotomy recently. Reviewed films and discussed with Drs. Hoss in [place].  Subtle irregularity involving the distal bile duct may be significant although, endoscopically, it was negotiated with catheter/balloon without any notable compromise on the lumen. Moreover, contrast was seen to drain readily.  The overall scenario may now be pointing away from biliary pancreatitis. "Focal" pancreatitis with edema and early stricture formation (benign or malignant) needs to be considered further. At this time, fully agree with the going back and brushing the distal bile duct for cytology and placing a plastic stent to ensure biliary drainage.  Cholecystectomy may need to be delayed. Patient will need further imaging of the pancreas in several weeks after any residual inflammation has settled down.

## 2015-09-08 NOTE — Anesthesia Postprocedure Evaluation (Signed)
Anesthesia Post Note  Patient: CLAYT VONADA  Procedure(s) Performed: Procedure(s) (LRB): ENDOSCOPIC RETROGRADE CHOLANGIOPANCREATOGRAPHY (ERCP) (N/A) BILIARY STENT PLACEMENT (N/A)  Patient location during evaluation: PACU Anesthesia Type: General Level of consciousness: awake and alert, oriented and patient cooperative Pain management: pain level controlled Vital Signs Assessment: post-procedure vital signs reviewed and stable Respiratory status: spontaneous breathing, nonlabored ventilation and respiratory function stable Cardiovascular status: blood pressure returned to baseline Postop Assessment: no signs of nausea or vomiting Anesthetic complications: no    Last Vitals:  Filed Vitals:   09/08/15 1510 09/08/15 1615  BP: 141/69 131/87  Pulse:  55  Temp:    Resp: 22 20    Last Pain:  Filed Vitals:   09/08/15 1619  PainSc: 0-No pain                 Betty Brooks J

## 2015-09-08 NOTE — Anesthesia Preprocedure Evaluation (Addendum)
Anesthesia Evaluation  Patient identified by MRN, date of birth, ID band Patient awake    Reviewed: Allergy & Precautions, NPO status , Patient's Chart, lab work & pertinent test results, reviewed documented beta blocker date and time   Airway Mallampati: III  TM Distance: <3 FB Neck ROM: Limited    Dental  (+) Teeth Intact   Pulmonary sleep apnea ,    + rhonchi        Cardiovascular Exercise Tolerance: Poor hypertension, Pt. on home beta blockers  Rhythm:Regular Rate:Normal     Neuro/Psych    GI/Hepatic GERD  Controlled,  Endo/Other  diabetes, Type 2, Oral Hypoglycemic Agents  Renal/GU      Musculoskeletal  (+) Arthritis ,   Abdominal (+) + obese,  Abdomen: soft. Bowel sounds: normal.  Peds  Hematology   Anesthesia Other Findings Parkinsonism with tremors  Reproductive/Obstetrics                           Anesthesia Physical Anesthesia Plan  ASA: III and emergent  Anesthesia Plan: General   Post-op Pain Management:    Induction: Cricoid pressure planned, Rapid sequence and Intravenous  Airway Management Planned: Oral ETT and Video Laryngoscope Planned  Additional Equipment:   Intra-op Plan:   Post-operative Plan: Extubation in OR  Informed Consent:   Plan Discussed with: Anesthesiologist and CRNA  Anesthesia Plan Comments:         Anesthesia Quick Evaluation

## 2015-09-09 ENCOUNTER — Other Ambulatory Visit: Payer: Self-pay

## 2015-09-09 ENCOUNTER — Telehealth: Payer: Self-pay | Admitting: Gastroenterology

## 2015-09-09 DIAGNOSIS — Z9889 Other specified postprocedural states: Secondary | ICD-10-CM | POA: Insufficient documentation

## 2015-09-09 DIAGNOSIS — R945 Abnormal results of liver function studies: Principal | ICD-10-CM

## 2015-09-09 DIAGNOSIS — R7989 Other specified abnormal findings of blood chemistry: Secondary | ICD-10-CM

## 2015-09-09 LAB — COMPREHENSIVE METABOLIC PANEL
ALT: 8 U/L — AB (ref 17–63)
AST: 64 U/L — AB (ref 15–41)
Albumin: 3 g/dL — ABNORMAL LOW (ref 3.5–5.0)
Alkaline Phosphatase: 329 U/L — ABNORMAL HIGH (ref 38–126)
Anion gap: 11 (ref 5–15)
BUN: 13 mg/dL (ref 6–20)
CALCIUM: 8.4 mg/dL — AB (ref 8.9–10.3)
CO2: 27 mmol/L (ref 22–32)
Chloride: 97 mmol/L — ABNORMAL LOW (ref 101–111)
Creatinine, Ser: 0.75 mg/dL (ref 0.61–1.24)
GFR calc Af Amer: >60 mL/min (ref >60)
GLUCOSE: 117 mg/dL — AB (ref 65–99)
Potassium: 3.6 mmol/L (ref 3.5–5.1)
SODIUM: 135 mmol/L (ref 135–145)
Total Bilirubin: 7.8 mg/dL — ABNORMAL HIGH (ref 0.3–1.2)
Total Protein: 6.6 g/dL (ref 6.5–8.1)

## 2015-09-09 LAB — GLUCOSE, CAPILLARY
GLUCOSE-CAPILLARY: 184 mg/dL — AB (ref 65–99)
Glucose-Capillary: 131 mg/dL — ABNORMAL HIGH (ref 65–99)

## 2015-09-09 LAB — CANCER ANTIGEN 19-9: CA 19-9: 22 U/mL (ref 0–35)

## 2015-09-09 MED ORDER — SUCCINYLCHOLINE CHLORIDE 20 MG/ML IJ SOLN
INTRAMUSCULAR | Status: AC
  Filled 2015-09-09: qty 1

## 2015-09-09 NOTE — Addendum Note (Signed)
Addendum  created 09/09/15 0748 by Mickel Baas, CRNA   Modules edited: Clinical Notes   Clinical Notes:  File: [id number]

## 2015-09-09 NOTE — Telephone Encounter (Signed)
Lab order was done by EG and released.  Elijah [name], please change ov.

## 2015-09-09 NOTE — Care Management Note (Addendum)
Case Management Note  Patient Details  Name: Elijah [name] MRN: [id number] Date of Birth: 1940-12-04  Subjective/Objective:                    Action/Plan: Discharge home with outpatient PT.  Referral sent to Outpatient Rehab, [phone]   Expected Discharge Date:  09/05/15               Expected Discharge Plan:  Home/Self Care  In-House Referral:  NA  Discharge planning Services  CM Consult  Post Acute Care Choice:  NA Choice offered to:  NA  DME Arranged:    DME Agency:     HH Arranged:    HH Agency:     Status of Service:     If discussed at [employer], dates discussed:    Additional Comments:  Alvie Heidelberg, RN 09/09/2015, 2:20 PM

## 2015-09-09 NOTE — Anesthesia Postprocedure Evaluation (Signed)
Anesthesia Post Note  Patient: Elijah [name]  Procedure(s) Performed: Procedure(s) (LRB): ENDOSCOPIC RETROGRADE CHOLANGIOPANCREATOGRAPHY (ERCP) (N/A) BILIARY STENT PLACEMENT (N/A)  Patient location during evaluation: Nursing Unit Anesthesia Type: General Level of consciousness: awake and alert and oriented Pain management: pain level controlled Vital Signs Assessment: post-procedure vital signs reviewed and stable Respiratory status: spontaneous breathing Cardiovascular status: stable Postop Assessment: no signs of nausea or vomiting Anesthetic complications: no    Last Vitals:  Filed Vitals:   09/08/15 2155 09/09/15 0603  BP: 144/76 130/77  Pulse: 60 60  Temp: 36.8 C 36.9 C  Resp: 18 18    Last Pain:  Filed Vitals:   09/09/15 0604  PainSc: 0-No pain                 ADAMS, AMY A

## 2015-09-09 NOTE — Telephone Encounter (Signed)
RESCHEDULED THE APPOINTMENT AND SPOKE TO THE NURSE ON 300 AND SHE WILL LET THE PATIENT KNOW ABOUT HIS NEW APPT DATE AND TIME WITH RMR

## 2015-09-09 NOTE — Progress Notes (Signed)
Pt d/c instructions given with no questions or concerns voiced by pt or wife.  IV removed.  Pt requested that his wife get him dressed.

## 2015-09-09 NOTE — Discharge Instructions (Signed)
Acute Pancreatitis Acute pancreatitis is a disease in which the pancreas becomes suddenly irritated (inflamed). The pancreas is a large gland behind your stomach. The pancreas makes enzymes that help digest food. The pancreas also makes 2 hormones that help control your blood sugar. Acute pancreatitis happens when the enzymes attack and damage the pancreas. Most attacks last a couple of days and can cause serious problems. HOME CARE  Follow your doctor's diet instructions. You may need to avoid alcohol and limit fat in your diet.  Eat small meals often.  Drink enough fluids to keep your pee (urine) clear or pale yellow.  Only take medicines as told by your doctor.  Avoid drinking alcohol if it caused your disease.  Do not smoke.  Get plenty of rest.  Check your blood sugar at home as told by your doctor.  Keep all doctor visits as told. GET HELP IF:  You do not get better as quickly as expected.  You have new or worsening symptoms.  You have lasting pain, weakness, or feel sick to your stomach (nauseous).  You get better and then have another pain attack. GET HELP RIGHT AWAY IF:   You are unable to eat or keep fluids down.  Your pain becomes severe.  You have a fever or lasting symptoms for more than 2 to 3 days.  You have a fever and your symptoms suddenly get worse.  Your skin or the white part of your eyes turn yellow (jaundice).  You throw up (vomit).  You feel dizzy, or you pass out (faint).  Your blood sugar is high (over 300 mg/dL). MAKE SURE YOU:   Understand these instructions.  Will watch your condition.  Will get help right away if you are not doing well or get worse.   This information is not intended to replace advice given to you by your health care provider. Make sure you discuss any questions you have with your health care provider.   Document Released: 07/26/2007 Document Revised: 02/27/2014 Document Reviewed: 05/18/2011 Elsevier Interactive  Patient Education [employer].

## 2015-09-09 NOTE — Telephone Encounter (Signed)
Noted  

## 2015-09-09 NOTE — Telephone Encounter (Signed)
Due to subsequent chain of events, we need to make modifications on previous recommendations.   Patient needs to have LFTs done on Monday, 09/13/15. Patient is aware to go to [facility] for these. We need to see patient back in the office in 3-4 weeks, preferably with RMR. HE DOES NOT NEED 2 WEEK FOLLOW UP NOW.  Thanks!

## 2015-09-09 NOTE — Consult Note (Signed)
   [facility] CM Inpatient Consult   09/09/2015  Elijah [name] 1940-07-20 [id number]  Spoke with patient at bedside regarding [facility] services. Patient does not want to sign up with [facility] until speaking with his wife. Patient given [facility] brochure and RNCM contact information for future reference.  Of note, [facility] Care Management services would not replace or interfere with any services that are arranged by inpatient case management or social work.  For additional questions or referrals please contact:  Royetta Crochet. Elijah Purser, RN, BSN, [facility] Liaison [phone]

## 2015-09-09 NOTE — Discharge Summary (Signed)
Physician Discharge Summary  Elijah [name] [id number] DOB: May 17, 1940 DOA: 09/02/2015  PCP: Wende Neighbors, MD  Admit date: 09/02/2015 Discharge date: 09/09/2015  Admitted From: Home Disposition:  Home  Recommendations for Outpatient Follow-up:  1. Follow up with PCP in 1-2 weeks 2. Follow up with GI as scheduled in 3-4 weeks  Discharge Condition:Improved CODE STATUS:Full Diet recommendation: diabetic  Brief/Interim Summary: 75 yom with a Hx of HTN, DM, and multiple hernia surgeries presented with complaints of waxing and waning abd pain with associated Sx of vomiting, fever, chills and no appetite. While in the ED he was noted to have biliary pancreatitis. GI and general surgery following. Patient had ERCP that showed no retained stone. Bilirubin higher today. Repeat labs in AM. If bilirubin trending down, anticipate discharge home.  1. Bilary pancreatitis. US revealed cholelithiasis and gallbladder wall thickening. Liver function tests were elevated on admission. Pt was unable to tolerate MRCP. Patient underwent ERCP 7/17 which did not show any retained stone in bile duct. Gen surgery also following and plan on performing cholecystectomy as an outpatient. Patient was continued on Zofran, IV abx, and pain management. LFT's and bili continued to rise. Pt underwent ERCP with stent placement on 7/19. Findings of slightly dilated CBD with blood clot hanging out of the ampullary orifice. LFT's trended down afterwards. By day of discharge, patient had completed 7 days of abx 2. Allergic reaction. Patient apparently developed redness and throat swelling after receiving levaquin. Received one dose of hydrocortisone that resolved symptoms. Unclear if this was related to levaquin or flagyl. Favor levaquin. Antibiotics were changed to Primaxin. No further evidence of reaction. 3. Chronic low back pain. Continued pain management.  4. Parkinson disease. Stable, continue on sinemet.  5. DM without  complication. Blood sugars currently stable. Continue to hold metformin. Will continue on sliding scale insulin.  6. Essential HTN. BP stable, Briefly held diovan.  7. HLD. Hold statin in the setting of elevated LFT.  8. Constipation. Improved with mag citrate  Discharge Diagnoses:  Principal Problem:   Pancreatitis Active Problems:   OSA (obstructive sleep apnea)   Chronic low back pain   Parkinson disease (HCC)   Diabetes mellitus without complication (HCC)   Hypertension   Essential hypertension   Jaundice   Elevated LFTs   Total bilirubin, elevated   Cholelithiasis   Pancreatitis, acute   Choledocholithiasis   Biliary obstruction   S/P ERCP     Medication List    TAKE these medications        ACCU-CHEK AVIVA PLUS test strip  Generic drug:  glucose blood     allopurinol 100 MG tablet  Commonly known as:  ZYLOPRIM  Take 100 mg by mouth daily.     aspirin 81 MG tablet  Take 81 mg by mouth daily.     Azelastine HCl 0.15 % Soln  Place 1 spray into the nose daily as needed.     carbidopa-levodopa 50-200 MG tablet  Commonly known as:  SINEMET CR  TAKE ONE TABLET FOUR TIMES A DAY     cyclobenzaprine 10 MG tablet  Commonly known as:  FLEXERIL  Take 1 tablet (10 mg total) by mouth 2 (two) times daily as needed for muscle spasms.     furosemide 20 MG tablet  Commonly known as:  LASIX  Take 1 tablet by mouth 2 (two) times daily as needed for fluid or edema.     gabapentin 100 MG capsule  Commonly known as:  NEURONTIN  Take 1 capsule (100 mg total) by mouth 3 (three) times daily.     LORazepam 1 MG tablet  Commonly known as:  ATIVAN  Take 1 mg by mouth as needed for anxiety.     meloxicam 15 MG tablet  Commonly known as:  MOBIC  Take 15 mg by mouth daily as needed for pain.     metFORMIN 500 MG tablet  Commonly known as:  GLUCOPHAGE  Take 500 mg by mouth 2 (two) times daily with a meal.     metoprolol tartrate 25 MG tablet  Commonly known as:   LOPRESSOR  Take 12.5 mg by mouth daily. 1/2 TABLET DAILY     pantoprazole 40 MG tablet  Commonly known as:  PROTONIX  Take 40 mg by mouth daily.     potassium chloride SA 20 MEQ tablet  Commonly known as:  K-DUR,KLOR-CON  Take 1 tablet by mouth daily.     pramipexole 1.5 MG tablet  Commonly known as:  MIRAPEX  Take 1 tablet (1.5 mg total) by mouth 3 (three) times daily.     SALINE MIST SPRAY NA  Place 1 spray into the nose as needed.     simvastatin 40 MG tablet  Commonly known as:  ZOCOR  Take 20 mg by mouth at bedtime.     valsartan-hydrochlorothiazide 80-12.5 MG tablet  Commonly known as:  DIOVAN-HCT  Take 1 tablet by mouth daily.       Follow-up Information    Follow up with Manus Rudd, MD. Go on 10/12/2015.   Specialty:  Gastroenterology   Why:  at 3:30.  Office will send you a letter reflecting change.   Contact information:   [place] [address] [phone]      Allergies  Allergen Reactions  . Aspirin Other (See Comments)    Stomach ulcers.  . Penicillins Rash    Has patient had a PCN reaction causing immediate rash, facial/tongue/throat swelling, SOB or lightheadedness with hypotension: no Has patient had a PCN reaction causing severe rash involving mucus membranes or skin necrosis: No Has patient had a PCN reaction that required hospitalization No Has patient had a PCN reaction occurring within the last 10 years: No If all of the above answers are "NO", then may proceed with Cephalosporin use.     Consultations:  GI  General Surgery   Procedures/Studies: Dg Chest 2 View  09/02/2015  CLINICAL DATA:  Chronic 1 year history of shortness of breath. EXAM: CHEST  2 VIEW COMPARISON:  07/11/2004. FINDINGS: Lateral image is suboptimal as the patient was unable to raise the arms. Patient is rotated to the right on the PA image. Cardiac silhouette mildly enlarged. Thoracic aorta mildly tortuous. Hilar and mediastinal contours otherwise  unremarkable. Linear atelectasis or scarring in the right lower lobe. Lungs otherwise clear. No localized airspace consolidation. No pleural effusions. No pneumothorax. Normal pulmonary vascularity. IMPRESSION: Mild cardiomegaly. Linear atelectasis or scarring in the right lower lobe. No acute cardiopulmonary disease otherwise. Electronically Signed   By: Evangeline Dakin M.D.   On: 09/02/2015 19:12   US Abdomen Complete  09/03/2015  CLINICAL DATA:  Jaundice EXAM: ABDOMEN ULTRASOUND COMPLETE COMPARISON:  09/02/2015 FINDINGS: Gallbladder: Multiple gallstones are noted. Gallbladder wall thickening is noted to 6 mm. Negative sonographic Percell Miller sign is noted. Common bile duct: Diameter: 8 mm. This is likely within normal limits given the patient's age. No biliary ductal dilatation was seen on recent CT examination. Liver: Mild increased echogenicity without definitive focal  mass. No biliary ductal dilatation is seen. IVC: No abnormality visualized. Pancreas: Visualized portion unremarkable. Spleen: Size and appearance within normal limits. Right Kidney: Length: 11.4 cm. Echogenicity within normal limits. No mass or hydronephrosis visualized. Left Kidney: Length: 12.5 cm. Echogenicity within normal limits. No mass or hydronephrosis visualized. Abdominal aorta: No aneurysm visualized. Other findings: None. IMPRESSION: Cholelithiasis and gallbladder wall thickening. Mild prominence of the common bile duct although this may be age related. Electronically Signed   By: Inez Catalina M.D.   On: 09/03/2015 09:36   Ct Abdomen Pelvis W Contrast  09/02/2015  CLINICAL DATA:  Right lower quadrant pain with nausea and vomiting for 3 days. Previous appendectomy. EXAM: CT ABDOMEN AND PELVIS WITH CONTRAST TECHNIQUE: Multidetector CT imaging of the abdomen and pelvis was performed using the standard protocol following bolus administration of intravenous contrast. CONTRAST:  169mL ISOVUE-300 IOPAMIDOL (ISOVUE-300) INJECTION 61%  COMPARISON:  08/30/2015 FINDINGS: Lower chest:  No acute findings. Hepatobiliary: No masses or other significant abnormality. Gallbladder is unremarkable. Pancreas: Increased prominence of pancreatic head and uncinate process seen with mild hazy density in adjacent peripancreatic fat. This raises suspicion for focal pancreatitis. There is no evidence of solid pancreatic mass or pancreatic ductal dilatation. No evidence of pancreatic necrosis or peripancreatic fluid collections. Spleen: Within normal limits in size and appearance. Adrenals/Urinary Tract: No masses identified. Several tiny left renal cysts noted. No evidence of hydronephrosis. Stomach/Bowel: No evidence of obstruction, inflammatory process, or abnormal fluid collections. Vascular/Lymphatic: No pathologically enlarged lymph nodes. No evidence of abdominal aortic aneurysm. Aortic atherosclerosis noted. Reproductive: No mass or other significant abnormality. Other: Small right inguinal hernia again seen containing a small portion of the urinary bladder. No evidence of herniated bowel loops. Musculoskeletal:  No suspicious bone lesions identified. IMPRESSION: Increased prominence of pancreatic head and uncinate process with hazy density in adjacent fat, suspicious for mild focal pancreatitis. Recommend correlation with serum amylase and lipase levels. Stable small right inguinal hernia containing a small portion of the urinary bladder. Aortic atherosclerosis noted. Electronically Signed   By: Earle Gell M.D.   On: 09/02/2015 19:17   Dg Ercp  09/08/2015  CLINICAL DATA:  Stent EXAM: ERCP TECHNIQUE: Multiple spot images obtained with the fluoroscopic device and submitted for interpretation post-procedure. FLUOROSCOPY TIME:  Radiation Exposure Index (as provided by the fluoroscopic device): If the device does not provide the exposure index: Fluoroscopy Time:  59 second Number of Acquired Images:  9 COMPARISON:  None. FINDINGS: Contrast fills the common  bile duct. The duct is dilated. The distal common bile duct is irregular. Underlying lesion is not excluded. The final image demonstrates a stent in place. The biliary system decompresses after stent placement. IMPRESSION: Here CP and stent placement as described. These images were submitted for radiologic interpretation only. Please see the procedural report for the amount of contrast and the fluoroscopy time utilized. Electronically Signed   By: Marybelle Killings M.D.   On: 09/08/2015 16:21   Dg Ercp With Sphincterotomy  09/07/2015  ADDENDUM REPORT: 09/07/2015 09:32 ADDENDUM: There is irregularity of the distal common bile duct and pancreatic duct at the insertion into the common bile duct. This may simply be related to edema. Underlying lesion is not excluded. Electronically Signed   By: Marybelle Killings M.D.   On: 09/07/2015 09:32  09/07/2015  ADDENDUM REPORT: 09/07/2015 08:50 Electronically Signed   By: Marybelle Killings M.D.   On: 09/07/2015 08:50  09/07/2015  EXAM: ERCP TECHNIQUE: Multiple spot images obtained with the  fluoroscopic device and submitted for interpretation post-procedure. FLUOROSCOPY TIME:  Radiation Exposure Index (as provided by the fluoroscopic device): If the device does not provide the exposure index: Fluoroscopy Time:  dictate in minutes and seconds Number of Acquired Images: COMPARISON:  None. IMPRESSION: These images were submitted for radiologic interpretation only. Please see the procedural report for the amount of contrast and the fluoroscopy time utilized. Electronically Signed: By: Marybelle Killings M.D. On: 09/06/2015 13:15   Ct Renal Stone Study  08/30/2015  CLINICAL DATA:  75 year old male with right lower quadrant abdominal pain. EXAM: CT ABDOMEN AND PELVIS WITHOUT CONTRAST TECHNIQUE: Multidetector CT imaging of the abdomen and pelvis was performed following the standard protocol without IV contrast. COMPARISON:  CT dated 07/02/2012 FINDINGS: Evaluation of this exam is limited in the  absence of intravenous contrast. Evaluation is also limited due to streak artifact caused by patient's arms. There is a partially visualized 4 mm nodular density in the right middle lobe (series 2, image 1). The visualized lung bases are otherwise clear. There is coronary vascular calcification. No intra-abdominal free air or free fluid. The liver, gallbladder, pancreas, spleen, adrenal glands, kidneys, visualized ureters, and urinary bladder appear unremarkable. The prostate and seminal vesicles are grossly unremarkable. Constipation. Doppler sigmoid and scattered colonic diverticula without active inflammatory changes. There is no evidence of bowel obstruction or acute inflammation. Appendectomy. Mild aortoiliac atherosclerotic disease. No portal venous gas identified. There is no adenopathy. There is a midline vertical anterior pelvic wall incisional scar. There is a small fat containing left inguinal hernia. There is a small right inguinal hernia. There is herniation of a portion of the bladder into the right inguinal canal. There is degenerative changes of the spine. No acute fracture. IMPRESSION: No hydronephrosis or nephrolithiasis. Constipation.  No bowel obstruction or active inflammation. Diverticulosis. Electronically Signed   By: Anner Crete M.D.   On: 08/30/2015 03:18     Subjective: No complaints today  Discharge Exam: Filed Vitals:   09/08/15 2155 09/09/15 0603  BP: 144/76 130/77  Pulse: 60 60  Temp: 98.2 F (36.8 C) 98.5 F (36.9 C)  Resp: 18 18   Filed Vitals:   09/08/15 1640 09/08/15 1651 09/08/15 2155 09/09/15 0603  BP: 142/75 154/107 144/76 130/77  Pulse: 54 54 60 60  Temp:  97.7 F (36.5 C) 98.2 F (36.8 C) 98.5 F (36.9 C)  TempSrc:  Oral Oral Oral  Resp: 16 18 18 18   Height:      Weight:    107.02 kg (235 lb 15 oz)  SpO2: 97% 94% 95% 95%    General: Pt is alert, awake, not in acute distress Cardiovascular: RRR, S1/S2 + Respiratory: CTA bilaterally, no  wheezing, no rhonchi Abdominal: Soft, NT, ND, bowel sounds + Extremities: no edema, no cyanosis    The results of significant diagnostics from this hospitalization (including imaging, microbiology, ancillary and laboratory) are listed below for reference.     Microbiology: Recent Results (from the past 240 hour(s))  Culture, blood (Routine X 2) w Reflex to ID Panel     Status: None   Collection Time: 09/02/15  6:20 PM  Result Value Ref Range Status   Specimen Description BLOOD RIGHT ARM DRAWN BY RN T.O.  Final   Special Requests BOTTLES DRAWN AEROBIC AND ANAEROBIC [place]  Final   Culture NO GROWTH 5 DAYS  Final   Report Status 09/07/2015 FINAL  Final  Culture, blood (Routine X 2) w Reflex to ID Panel  Status: None   Collection Time: 09/02/15  6:28 PM  Result Value Ref Range Status   Specimen Description BLOOD LEFT HAND  Final   Special Requests BOTTLES DRAWN AEROBIC AND ANAEROBIC 4CC EACH  Final   Culture NO GROWTH 5 DAYS  Final   Report Status 09/07/2015 FINAL  Final     Labs: BNP (last 3 results) No results for input(s): BNP in the last 8760 hours. Basic Metabolic Panel:  Recent Labs Lab 09/05/15 0613 09/06/15 0639 09/07/15 0710 09/08/15 0607 09/09/15 0554  NA 136 139 135 134* 135  K 3.8 3.9 3.6 4.0 3.6  CL 102 107 101 99* 97*  CO2 26 23 28 28 27   GLUCOSE 153* 119* 120* 130* 117*  BUN 13 12 15 14 13   CREATININE 0.68 0.74 0.69 0.62 0.75  CALCIUM 8.4* 8.4* 8.3* 8.5* 8.4*   Liver Function Tests:  Recent Labs Lab 09/05/15 0613 09/06/15 0639 09/07/15 0710 09/08/15 0607 09/09/15 0554  AST 90* 73* 93* 100* 64*  ALT 9* 15* 8* 13* 8*  ALKPHOS 235* 233* 259* 321* 329*  BILITOT 6.4* 6.1* 9.8* 11.3* 7.8*  PROT 6.4* 6.0* 6.2* 6.4* 6.6  ALBUMIN 3.1* 2.9* 2.9* 3.0* 3.0*    Recent Labs Lab 09/02/15 1732 09/07/15 0710  LIPASE 383* 30   No results for input(s): AMMONIA in the last 168 hours. CBC:  Recent Labs Lab 09/02/15 1732 09/03/15 0505  09/04/15 0533 09/06/15 0639  WBC 9.1 8.3 5.8 5.0  NEUTROABS 7.9*  --   --   --   HGB 12.5* 12.0* 12.4* 11.9*  HCT 35.7* 34.9* 36.4* 34.6*  MCV 84.6 85.7 86.1 84.8  PLT 189 182 188 233   Cardiac Enzymes: No results for input(s): CKTOTAL, CKMB, CKMBINDEX, TROPONINI in the last 168 hours. BNP: Invalid input(s): POCBNP CBG:  Recent Labs Lab 09/08/15 1618 09/08/15 1658 09/08/15 2023 09/09/15 0719 09/09/15 1121  GLUCAP 93 101* 172* 131* 184*   D-Dimer No results for input(s): DDIMER in the last 72 hours. Hgb A1c No results for input(s): HGBA1C in the last 72 hours. Lipid Profile No results for input(s): CHOL, HDL, LDLCALC, TRIG, CHOLHDL, LDLDIRECT in the last 72 hours. Thyroid function studies No results for input(s): TSH, T4TOTAL, T3FREE, THYROIDAB in the last 72 hours.  Invalid input(s): FREET3 Anemia work up No results for input(s): VITAMINB12, FOLATE, FERRITIN, TIBC, IRON, RETICCTPCT in the last 72 hours. Urinalysis    Component Value Date/Time   COLORURINE YELLOW 09/02/2015 2018   APPEARANCEUR CLEAR 09/02/2015 2018   LABSPEC <1.005* 09/02/2015 2018   PHURINE 6.5 09/02/2015 2018   GLUCOSEU NEGATIVE 09/02/2015 2018   HGBUR NEGATIVE 09/02/2015 2018   BILIRUBINUR MODERATE* 09/02/2015 2018   KETONESUR NEGATIVE 09/02/2015 2018   PROTEINUR NEGATIVE 09/02/2015 2018   UROBILINOGEN 0.2 10/14/2009 1851   NITRITE NEGATIVE 09/02/2015 2018   LEUKOCYTESUR NEGATIVE 09/02/2015 2018   Sepsis Labs Invalid input(s): PROCALCITONIN,  WBC,  LACTICIDVEN Microbiology Recent Results (from the past 240 hour(s))  Culture, blood (Routine X 2) w Reflex to ID Panel     Status: None   Collection Time: 09/02/15  6:20 PM  Result Value Ref Range Status   Specimen Description BLOOD RIGHT ARM DRAWN BY RN T.O.  Final   Special Requests BOTTLES DRAWN AEROBIC AND ANAEROBIC [place]  Final   Culture NO GROWTH 5 DAYS  Final   Report Status 09/07/2015 FINAL  Final  Culture, blood (Routine X 2) w  Reflex to ID Panel  Status: None   Collection Time: 09/02/15  6:28 PM  Result Value Ref Range Status   Specimen Description BLOOD LEFT HAND  Final   Special Requests BOTTLES DRAWN AEROBIC AND ANAEROBIC 4CC EACH  Final   Culture NO GROWTH 5 DAYS  Final   Report Status 09/07/2015 FINAL  Final     Johnavon Mcclafferty, Orpah Melter, MD  [facility] 09/09/2015, 1:52 PM  If 7PM-7AM, please contact night-coverage [email or web address] Password [email or web address]

## 2015-09-09 NOTE — Progress Notes (Signed)
Subjective:  Wants to go home. No abdominal pain. No n/v.   Objective: Vital signs in last 24 hours: Temp:  [97.5 F (36.4 C)-98.6 F (37 C)] 98.5 F (36.9 C) (07/20 0603) Pulse Rate:  [53-60] 60 (07/20 0603) Resp:  [16-24] 18 (07/20 0603) BP: (130-154)/(60-107) 130/77 mmHg (07/20 0603) SpO2:  [94 %-97 %] 95 % (07/20 0603) Weight:  [235 lb 15 oz (107.02 kg)-236 lb (107.049 kg)] 235 lb 15 oz (107.02 kg) (07/20 0603) Last BM Date: 09/07/15 General:   Alert,  Well-developed, well-nourished, pleasant and cooperative in NAD Head:  Normocephalic and atraumatic. Eyes:  Sclera clear icterus.  Abdomen:  Soft, nontender and nondistended.  Normal bowel sounds, without guarding, and without rebound.   Extremities:  Without clubbing, deformity or edema. Neurologic:  Alert and  oriented x4;  grossly normal neurologically. Skin:  Intact without significant lesions or rashes. Psych:  Alert and cooperative. Normal mood and affect.  Intake/Output from previous day: 07/19 0701 - 07/20 0700 In: 840 [P.O.:240; I.V.:600] Out: 1300 [Urine:1300] Intake/Output this shift:    Lab Results: CBC No results for input(s): WBC, HGB, HCT, MCV, PLT in the last 72 hours. BMET  Recent Labs  09/07/15 0710 09/08/15 0607 09/09/15 0554  NA 135 134* 135  K 3.6 4.0 3.6  CL 101 99* 97*  CO2 28 28 27   GLUCOSE 120* 130* 117*  BUN 15 14 13   CREATININE 0.69 0.62 0.75  CALCIUM 8.3* 8.5* 8.4*   LFTs  Recent Labs  09/07/15 0710 09/08/15 0607 09/09/15 0554  BILITOT 9.8* 11.3* 7.8*  BILIDIR  --  7.4*  --   ALKPHOS 259* 321* 329*  AST 93* 100* 64*  ALT 8* 13* 8*  PROT 6.2* 6.4* 6.6  ALBUMIN 2.9* 3.0* 3.0*    Recent Labs  09/07/15 0710  LIPASE 30   PT/INR No results for input(s): LABPROT, INR in the last 72 hours.    Imaging Studies: Dg Chest 2 View  09/02/2015  CLINICAL DATA:  Chronic 1 year history of shortness of breath. EXAM: CHEST  2 VIEW COMPARISON:  07/11/2004. FINDINGS: Lateral image  is suboptimal as the patient was unable to raise the arms. Patient is rotated to the right on the PA image. Cardiac silhouette mildly enlarged. Thoracic aorta mildly tortuous. Hilar and mediastinal contours otherwise unremarkable. Linear atelectasis or scarring in the right lower lobe. Lungs otherwise clear. No localized airspace consolidation. No pleural effusions. No pneumothorax. Normal pulmonary vascularity. IMPRESSION: Mild cardiomegaly. Linear atelectasis or scarring in the right lower lobe. No acute cardiopulmonary disease otherwise. Electronically Signed   By: Evangeline Dakin M.D.   On: 09/02/2015 19:12   US Abdomen Complete  09/03/2015  CLINICAL DATA:  Jaundice EXAM: ABDOMEN ULTRASOUND COMPLETE COMPARISON:  09/02/2015 FINDINGS: Gallbladder: Multiple gallstones are noted. Gallbladder wall thickening is noted to 6 mm. Negative sonographic Percell Miller sign is noted. Common bile duct: Diameter: 8 mm. This is likely within normal limits given the patient's age. No biliary ductal dilatation was seen on recent CT examination. Liver: Mild increased echogenicity without definitive focal mass. No biliary ductal dilatation is seen. IVC: No abnormality visualized. Pancreas: Visualized portion unremarkable. Spleen: Size and appearance within normal limits. Right Kidney: Length: 11.4 cm. Echogenicity within normal limits. No mass or hydronephrosis visualized. Left Kidney: Length: 12.5 cm. Echogenicity within normal limits. No mass or hydronephrosis visualized. Abdominal aorta: No aneurysm visualized. Other findings: None. IMPRESSION: Cholelithiasis and gallbladder wall thickening. Mild prominence of the common bile duct although this may  be age related. Electronically Signed   By: Inez Catalina M.D.   On: 09/03/2015 09:36   Ct Abdomen Pelvis W Contrast  09/02/2015  CLINICAL DATA:  Right lower quadrant pain with nausea and vomiting for 3 days. Previous appendectomy. EXAM: CT ABDOMEN AND PELVIS WITH CONTRAST TECHNIQUE:  Multidetector CT imaging of the abdomen and pelvis was performed using the standard protocol following bolus administration of intravenous contrast. CONTRAST:  131mL ISOVUE-300 IOPAMIDOL (ISOVUE-300) INJECTION 61% COMPARISON:  08/30/2015 FINDINGS: Lower chest:  No acute findings. Hepatobiliary: No masses or other significant abnormality. Gallbladder is unremarkable. Pancreas: Increased prominence of pancreatic head and uncinate process seen with mild hazy density in adjacent peripancreatic fat. This raises suspicion for focal pancreatitis. There is no evidence of solid pancreatic mass or pancreatic ductal dilatation. No evidence of pancreatic necrosis or peripancreatic fluid collections. Spleen: Within normal limits in size and appearance. Adrenals/Urinary Tract: No masses identified. Several tiny left renal cysts noted. No evidence of hydronephrosis. Stomach/Bowel: No evidence of obstruction, inflammatory process, or abnormal fluid collections. Vascular/Lymphatic: No pathologically enlarged lymph nodes. No evidence of abdominal aortic aneurysm. Aortic atherosclerosis noted. Reproductive: No mass or other significant abnormality. Other: Small right inguinal hernia again seen containing a small portion of the urinary bladder. No evidence of herniated bowel loops. Musculoskeletal:  No suspicious bone lesions identified. IMPRESSION: Increased prominence of pancreatic head and uncinate process with hazy density in adjacent fat, suspicious for mild focal pancreatitis. Recommend correlation with serum amylase and lipase levels. Stable small right inguinal hernia containing a small portion of the urinary bladder. Aortic atherosclerosis noted. Electronically Signed   By: Earle Gell M.D.   On: 09/02/2015 19:17   Dg Ercp  09/08/2015  CLINICAL DATA:  Stent EXAM: ERCP TECHNIQUE: Multiple spot images obtained with the fluoroscopic device and submitted for interpretation post-procedure. FLUOROSCOPY TIME:  Radiation Exposure  Index (as provided by the fluoroscopic device): If the device does not provide the exposure index: Fluoroscopy Time:  59 second Number of Acquired Images:  9 COMPARISON:  None. FINDINGS: Contrast fills the common bile duct. The duct is dilated. The distal common bile duct is irregular. Underlying lesion is not excluded. The final image demonstrates a stent in place. The biliary system decompresses after stent placement. IMPRESSION: Here CP and stent placement as described. These images were submitted for radiologic interpretation only. Please see the procedural report for the amount of contrast and the fluoroscopy time utilized. Electronically Signed   By: Marybelle Killings M.D.   On: 09/08/2015 16:21   Dg Ercp With Sphincterotomy  09/07/2015  ADDENDUM REPORT: 09/07/2015 09:32 ADDENDUM: There is irregularity of the distal common bile duct and pancreatic duct at the insertion into the common bile duct. This may simply be related to edema. Underlying lesion is not excluded. Electronically Signed   By: Marybelle Killings M.D.   On: 09/07/2015 09:32  09/07/2015  ADDENDUM REPORT: 09/07/2015 08:50 Electronically Signed   By: Marybelle Killings M.D.   On: 09/07/2015 08:50  09/07/2015  EXAM: ERCP TECHNIQUE: Multiple spot images obtained with the fluoroscopic device and submitted for interpretation post-procedure. FLUOROSCOPY TIME:  Radiation Exposure Index (as provided by the fluoroscopic device): If the device does not provide the exposure index: Fluoroscopy Time:  dictate in minutes and seconds Number of Acquired Images: COMPARISON:  None. IMPRESSION: These images were submitted for radiologic interpretation only. Please see the procedural report for the amount of contrast and the fluoroscopy time utilized. Electronically Signed: By: Rodena Goldmann.D.  On: 09/06/2015 13:15   Ct Renal Stone Study  08/30/2015  CLINICAL DATA:  75 year old male with right lower quadrant abdominal pain. EXAM: CT ABDOMEN AND PELVIS WITHOUT CONTRAST  TECHNIQUE: Multidetector CT imaging of the abdomen and pelvis was performed following the standard protocol without IV contrast. COMPARISON:  CT dated 07/02/2012 FINDINGS: Evaluation of this exam is limited in the absence of intravenous contrast. Evaluation is also limited due to streak artifact caused by patient's arms. There is a partially visualized 4 mm nodular density in the right middle lobe (series 2, image 1). The visualized lung bases are otherwise clear. There is coronary vascular calcification. No intra-abdominal free air or free fluid. The liver, gallbladder, pancreas, spleen, adrenal glands, kidneys, visualized ureters, and urinary bladder appear unremarkable. The prostate and seminal vesicles are grossly unremarkable. Constipation. Doppler sigmoid and scattered colonic diverticula without active inflammatory changes. There is no evidence of bowel obstruction or acute inflammation. Appendectomy. Mild aortoiliac atherosclerotic disease. No portal venous gas identified. There is no adenopathy. There is a midline vertical anterior pelvic wall incisional scar. There is a small fat containing left inguinal hernia. There is a small right inguinal hernia. There is herniation of a portion of the bladder into the right inguinal canal. There is degenerative changes of the spine. No acute fracture. IMPRESSION: No hydronephrosis or nephrolithiasis. Constipation.  No bowel obstruction or active inflammation. Diverticulosis. Electronically Signed   By: Anner Crete M.D.   On: 08/30/2015 03:18  [2 weeks]   Assessment: 75 year old male with pancreatitis per labs and imaging, admitted 09/03/15. On Abx. Abdominal U/S ordered which noted cholelithiasis and gallbladder wall thickening with mild prominence of the CBD. Symptomatically improved over the weekend but remained jaundiced with scleral icterus. Presentation seems indicative of gallstone pancreatitis with worsening labs. Patient refused MRCP even in  large-bore MRI due to extreme claustrophobia with previously failed MRI attempts with sedation, unable to use propofol due to need to follow breathing commands.  Bilirubin remained elevated over the weekend and the patient was taken for ERCP yesterday which noted slightly dilated bile duct, no definite common duct stones seen, s/p biliary sphincterotomy, sphincterotomy balloon dilation and balloon dredging of the biliary tree. "An element of papillary stenosis not excluded, recent passage of a gallstone not excluded either."   Clinically doing ok. Since 1st ERCP, his bilirubin has nearly doubled. Radiology addendum to ERCP fluroscopy images noted irregularity of the distal CBD and pancreatic duct at insertion to CBD may simply be related to edema but underlying lesion not excluded. No fever, chills. On Abx.   ERCP with stenting 09/08/15: blood clot hanging out of ampullary orifice possibly cause of biliary obstruction and findings on prior cholangiogram. S/p stenting and bile duct brushing. His bilirubin has significantly dropped overnight.  Plan: 1. From GI standpoint, patient stable for discharge. 2. We have made arrangements for outpatient LFTs on Monday. I personally told patient and his wife. 3. Office visit in 3-4 weeks for follow up. 4. Would recommend holding off on cholecystectomy for now until current biliary issues are resolved and adequately managed.   Laureen Ochs. Bernarda Caffey [facility] [phone] 7/20/20179:21 AM    LOS: 7 days

## 2015-09-09 NOTE — Telephone Encounter (Signed)
Opened in error

## 2015-09-10 ENCOUNTER — Encounter (HOSPITAL_COMMUNITY): Payer: Self-pay | Admitting: Internal Medicine

## 2015-09-13 ENCOUNTER — Telehealth: Payer: Self-pay | Admitting: Internal Medicine

## 2015-09-13 DIAGNOSIS — R7989 Other specified abnormal findings of blood chemistry: Secondary | ICD-10-CM | POA: Diagnosis not present

## 2015-09-13 NOTE — Telephone Encounter (Signed)
Elijah [name] checked with pathology and this has not been resulted yet. I have called and explained this to the pts wife and she was very understanding.

## 2015-09-13 NOTE — Telephone Encounter (Signed)
Patient wife called inquiring about pathology report from procedure last week

## 2015-09-14 ENCOUNTER — Telehealth: Payer: Self-pay | Admitting: Neurology

## 2015-09-14 ENCOUNTER — Encounter (HOSPITAL_COMMUNITY): Payer: Self-pay | Admitting: Internal Medicine

## 2015-09-14 LAB — HEPATIC FUNCTION PANEL
ALK PHOS: 314 U/L — AB (ref 40–115)
ALT: 114 U/L — AB (ref 9–46)
AST: 89 U/L — AB (ref 10–35)
Albumin: 4.1 g/dL (ref 3.6–5.1)
BILIRUBIN DIRECT: 2.1 mg/dL — AB (ref <=0.2)
BILIRUBIN TOTAL: 4.7 mg/dL — AB (ref 0.2–1.2)
Indirect Bilirubin: 2.6 mg/dL — ABNORMAL HIGH (ref 0.2–1.2)
Total Protein: 6.9 g/dL (ref 6.1–8.1)

## 2015-09-14 NOTE — Telephone Encounter (Signed)
Returned call and spoke to pt's wife. Elijah [name] that pt was discharged from the hospital last Thursday. Since then, he has been able to go to doctor appts and run errands with his wife. Outpt PT was ordered upon discharge. Recommended that pt participate in therapy for increasing strength/mobility. Verbalized understanding and appreciation for call. Plans to keep follow-up appt in August w/ Dr. Jannifer Franklin but will call back w/ any additional concerns.

## 2015-09-14 NOTE — Telephone Encounter (Signed)
Pt has been admitted to [facility] and there is an order for PT. Wife is calling wanting to know if it would be ok for him to do it. Please call and advise [phone], Vaughan Basta

## 2015-09-15 ENCOUNTER — Other Ambulatory Visit: Payer: Self-pay

## 2015-09-15 DIAGNOSIS — K831 Obstruction of bile duct: Secondary | ICD-10-CM

## 2015-09-15 NOTE — Telephone Encounter (Signed)
Randall Hiss and [place],  pts path has returned. Please see results.

## 2015-09-15 NOTE — Telephone Encounter (Signed)
Referral has been faxed.

## 2015-09-15 NOTE — Telephone Encounter (Signed)
I have looked at result and put in a message to Dr. Gala Romney.

## 2015-09-15 NOTE — Telephone Encounter (Signed)
Discussed LFTs and pathology with patient's wife at his request. Discussed pathology findings with Dr. Gala Romney.   CBD brushings with atypical cells suspicious for adenocarcinoma.   Per Dr. Gala Romney. Patient needs EUS with Dr. Jerene Pitch ASAP for "CBD obstruction requiring stent, CBD brushings with atypical cells suspicious for adenocarcinoma".

## 2015-09-15 NOTE — Telephone Encounter (Signed)
Elijah [name], can you schedule this asap please.

## 2015-09-17 ENCOUNTER — Telehealth (HOSPITAL_COMMUNITY): Payer: Self-pay

## 2015-09-27 NOTE — Progress Notes (Signed)
Late entry. Patient referred to [facility] as outlined under another telephone encounter. Scheduled for EUS on August 8, surgery oncology consult the following week. I discussed plan with patient's wife per his request, and with scheduler at [facility].

## 2015-09-28 DIAGNOSIS — K859 Acute pancreatitis without necrosis or infection, unspecified: Secondary | ICD-10-CM | POA: Diagnosis not present

## 2015-09-28 DIAGNOSIS — K8050 Calculus of bile duct without cholangitis or cholecystitis without obstruction: Secondary | ICD-10-CM | POA: Diagnosis not present

## 2015-09-28 DIAGNOSIS — Z7984 Long term (current) use of oral hypoglycemic drugs: Secondary | ICD-10-CM | POA: Diagnosis not present

## 2015-09-28 DIAGNOSIS — E119 Type 2 diabetes mellitus without complications: Secondary | ICD-10-CM | POA: Diagnosis not present

## 2015-09-28 DIAGNOSIS — G20 Parkinson's disease: Secondary | ICD-10-CM | POA: Diagnosis not present

## 2015-09-28 DIAGNOSIS — K839 Disease of biliary tract, unspecified: Secondary | ICD-10-CM | POA: Diagnosis not present

## 2015-09-28 DIAGNOSIS — K831 Obstruction of bile duct: Secondary | ICD-10-CM | POA: Diagnosis not present

## 2015-09-28 DIAGNOSIS — I10 Essential (primary) hypertension: Secondary | ICD-10-CM | POA: Diagnosis not present

## 2015-09-29 DIAGNOSIS — C221 Intrahepatic bile duct carcinoma: Secondary | ICD-10-CM | POA: Diagnosis not present

## 2015-09-30 ENCOUNTER — Telehealth: Payer: Self-pay | Admitting: Internal Medicine

## 2015-09-30 NOTE — Telephone Encounter (Signed)
Pt's wife called with questions regarding why patient needed to see RMR on 8/22 when he is going to [facility]. She was asking to speak with LSL, but I told her that LSL wasn't available at the moment to answer her questions. Please call [phone]

## 2015-10-01 ENCOUNTER — Ambulatory Visit: Payer: PRIVATE HEALTH INSURANCE | Admitting: Nurse Practitioner

## 2015-10-01 DIAGNOSIS — K831 Obstruction of bile duct: Secondary | ICD-10-CM | POA: Diagnosis not present

## 2015-10-01 DIAGNOSIS — Z880 Allergy status to penicillin: Secondary | ICD-10-CM | POA: Diagnosis not present

## 2015-10-01 DIAGNOSIS — R109 Unspecified abdominal pain: Secondary | ICD-10-CM | POA: Diagnosis not present

## 2015-10-01 NOTE — Telephone Encounter (Signed)
LMOAM. No need for OV here with RMR on 10/12/15 since he is actively being worked up at [facility].   He had ERCP and EUS on 09/28/2015 at [facility]. 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 EUS showed normal-appearing pancreas., Diffuse bile duct wall thickening mostly distally, status post FNA. Cytology results are pending. Patient has an appointment to see Dr. Crisoforo Oxford next week.   PLEASE CANCEL APPT WITH RMR 10/12/15. WE WILL FOLLOW CASE PERIPHERALLY THROUGH CARE EVERYWHERE FOR NOW.

## 2015-10-04 DIAGNOSIS — C221 Intrahepatic bile duct carcinoma: Secondary | ICD-10-CM | POA: Diagnosis not present

## 2015-10-04 NOTE — Telephone Encounter (Signed)
CANCELLED APPOINTMENT

## 2015-10-12 ENCOUNTER — Ambulatory Visit: Payer: PRIVATE HEALTH INSURANCE | Admitting: Internal Medicine

## 2015-10-21 ENCOUNTER — Ambulatory Visit (INDEPENDENT_AMBULATORY_CARE_PROVIDER_SITE_OTHER): Payer: PPO | Admitting: Neurology

## 2015-10-21 ENCOUNTER — Encounter: Payer: Self-pay | Admitting: Neurology

## 2015-10-21 VITALS — BP 136/68 | HR 64 | Ht 70.0 in | Wt 226.6 lb

## 2015-10-21 DIAGNOSIS — G20 Parkinson's disease: Secondary | ICD-10-CM

## 2015-10-21 MED ORDER — PRAMIPEXOLE DIHYDROCHLORIDE 1.5 MG PO TABS: 1.5000 mg | ORAL_TABLET | Freq: Three times a day (TID) | ORAL | 5 refills | Status: DC

## 2015-10-21 MED ORDER — CARBIDOPA-LEVODOPA ER 50-200 MG PO TBCR
EXTENDED_RELEASE_TABLET | ORAL | 5 refills | Status: DC
Start: 2015-10-21 — End: 2016-04-27

## 2015-10-21 NOTE — Progress Notes (Signed)
Reason for visit: Parkinson disease  Elijah [name] is an 75 y.o. male  History of present illness:  Elijah [name] is a 74 year old left-handed white male with a history of Parkinson's disease. The patient has recently been in the hospital around 09/02/2015 with pancreatitis. The patient has been evaluated for possible cholangiocarcinoma, but evaluations have shown atypical cells but no definite diagnosis of cancer has been made. The patient has had a biliary stent placement that has helped his symptoms. The patient is being followed through [facility]. The patient has otherwise been maintaining his physical abilities fairly well. He remains on Sinemet taking the CR 50/200 mg tablets 4 times daily. His daytime drowsiness has improved since last seen. He indicates that he gets up 3 or 4 times at night to urinate, he is on Lasix taking 20 mg twice daily, the last dose is at 6 PM. The patient is on gabapentin for his back pain. He returns for an evaluation.  Past Medical History:  Diagnosis Date  . Chronic low back pain 03/24/2015  . Degenerative arthritis   . Diabetes mellitus without complication ([place])   . Dyslipidemia   . Glaucoma   . Gout 10/07/2012  . Hypertension   . Low back pain   . Obesity   . OSA (obstructive sleep apnea) 04/10/2014  . Pancreatitis   . Parkinson disease ([place])   . Rotator cuff tear    Bilateral, no surgery    Past Surgical History:  Procedure Laterality Date  . APPENDECTOMY    . BALLOON DILATION N/A 09/06/2015   Procedure: BALLOON DILATION;  Surgeon: Daneil Dolin, MD;  [facility];  Service: Endoscopy;  Laterality: N/A;  . BILIARY STENT PLACEMENT N/A 09/08/2015   Procedure: BILIARY STENT PLACEMENT;  Surgeon: Daneil Dolin, MD;  Location[facility];  Service: Endoscopy;  Laterality: N/A;  . ERCP N/A 09/06/2015   Procedure: ENDOSCOPIC RETROGRADE CHOLANGIOPANCREATOGRAPHY (ERCP);  Surgeon: Daneil Dolin, MD;  [facility];  Service:  Endoscopy;  Laterality: N/A;  . ERCP N/A 09/08/2015   Procedure: ENDOSCOPIC RETROGRADE CHOLANGIOPANCREATOGRAPHY (ERCP);  Surgeon: Daneil Dolin, MD;  [facility];  Service: Endoscopy;  Laterality: N/A;  with bile duct brushings  . HERNIA REPAIR    . SHOULDER BONE SPUR Left    RESECTION  . SPHINCTEROTOMY N/A 09/06/2015   Procedure: SPHINCTEROTOMY;  Surgeon: Daneil Dolin, MD;  [facility];  Service: Endoscopy;  Laterality: N/A;  . TONSILLECTOMY      Family History  Problem Relation Age of Onset  . Stroke Mother   . Diabetes Mother   . Heart Problems Mother   . Parkinsonism Father   . Heart Problems Father     Social history:  reports that he has never smoked. He has never used smokeless tobacco. He reports that he does not drink alcohol or use drugs.    Allergies  Allergen Reactions  . Aspirin Other (See Comments)    Stomach ulcers.  . Coconut Oil     Other reaction(s): GI Upset (intolerance)  . Penicillins Rash    Has patient had a PCN reaction causing immediate rash, facial/tongue/throat swelling, SOB or lightheadedness with hypotension: no Has patient had a PCN reaction causing severe rash involving mucus membranes or skin necrosis: No Has patient had a PCN reaction that required hospitalization No Has patient had a PCN reaction occurring within the last 10 years: No If all of the above answers are "  NO", then may proceed with Cephalosporin use.     Medications:  Prior to Admission medications   Medication Sig Start Date End Date Taking? Authorizing Provider  ACCU-CHEK AVIVA PLUS test strip  12/05/13  Yes Historical Provider, MD  allopurinol (ZYLOPRIM) 100 MG tablet Take 100 mg by mouth daily.  10/05/12  Yes Historical Provider, MD  aspirin 81 MG tablet Take 81 mg by mouth daily.   Yes Historical Provider, MD  Azelastine HCl 0.15 % SOLN Place 1 spray into the nose daily as needed. 08/18/15  Yes Historical Provider, MD  carbidopa-levodopa (SINEMET  CR) 50-200 MG tablet TAKE ONE TABLET FOUR TIMES A DAY 03/24/15  Yes Kathrynn Ducking, MD  cyclobenzaprine (FLEXERIL) 10 MG tablet Take 1 tablet (10 mg total) by mouth 2 (two) times daily as needed for muscle spasms. 07/02/12  Yes Teressa Lower, MD  furosemide (LASIX) 20 MG tablet Take 1 tablet by mouth 2 (two) times daily as needed for fluid or edema.  10/02/13  Yes Historical Provider, MD  gabapentin (NEURONTIN) 100 MG capsule Take 1 capsule (100 mg total) by mouth 3 (three) times daily. 04/23/15  Yes Kathrynn Ducking, MD  LORazepam (ATIVAN) 1 MG tablet Take 1 mg by mouth as needed for anxiety.   Yes Historical Provider, MD  meloxicam (MOBIC) 15 MG tablet Take 15 mg by mouth daily as needed for pain.  09/30/12  Yes Historical Provider, MD  metFORMIN (GLUCOPHAGE) 500 MG tablet Take 500 mg by mouth 2 (two) times daily with a meal.  10/05/12  Yes Historical Provider, MD  metoprolol tartrate (LOPRESSOR) 25 MG tablet Take 12.5 mg by mouth daily. 1/2 TABLET DAILY   Yes Historical Provider, MD  pantoprazole (PROTONIX) 40 MG tablet Take 40 mg by mouth daily. 03/08/14  Yes Historical Provider, MD  potassium chloride SA (K-DUR,KLOR-CON) 20 MEQ tablet Take 1 tablet by mouth daily.  10/24/13  Yes Historical Provider, MD  pramipexole (MIRAPEX) 1.5 MG tablet Take 1 tablet (1.5 mg total) by mouth 3 (three) times daily. 03/24/15  Yes Kathrynn Ducking, MD  SALINE MIST SPRAY NA Place 1 spray into the nose as needed.    Yes Historical Provider, MD  simvastatin (ZOCOR) 40 MG tablet Take 20 mg by mouth at bedtime.  09/05/12  Yes Historical Provider, MD  valsartan-hydrochlorothiazide (DIOVAN-HCT) 80-12.5 MG per tablet Take 1 tablet by mouth daily.   Yes Historical Provider, MD    ROS:  Out of a complete 14 system review of symptoms, the patient complains only of the following symptoms, and all other reviewed systems are negative.  Shortness of breath Joint pain, back pain, walking difficulty Snoring  Height 5\' 10"  (1.778 m),  weight 226 lb 9.6 oz (102.8 kg).  Physical Exam  General: The patient is alert and cooperative at the time of the examination. The patient is moderately obese.  Neuromuscular: The patient leans to the right, posture is stooped.  Skin: No significant peripheral edema is noted.   Neurologic Exam  Mental status: The patient is alert and oriented x 3 at the time of the examination. The patient has apparent normal recent and remote memory, with an apparently normal attention span and concentration ability.   Cranial nerves: Facial symmetry is present. Speech is normal, no aphasia or dysarthria is noted. Extraocular movements are full. Visual fields are full. Masking of the face is seen.  Motor: The patient has good strength in all 4 extremities, with exception that the patient may have some  slight weakness with hip flexion on the right.  Sensory examination: Soft touch sensation is symmetric on the face, arms, and legs.  Coordination: The patient has good finger-nose-finger and heel-to-shin bilaterally.  Gait and station: The patient is able to arise from a seated position with arms crossed with some difficulty. Once up, he can ambulate independently, he does have a cane to use. The patient is stooped, leans to the right. Patient has a resting tremor with the left arm, decreased arm swing bilaterally. Tandem gait is slightly unsteady, Romberg is negative.  Reflexes: Deep tendon reflexes are symmetric.   Assessment/Plan:  1. Parkinson's disease  2. Gait disorder  3. Low back pain  The patient currently is being followed for possible cholangiocarcinoma. The patient is to remain as active as possible. He will continue the Sinemet, a prescription was called in for this and for the Mirapex. The patient will stop the gabapentin as he feels that this may be leading to some ankle swelling. He will follow-up in 5 months, sooner if needed.  Jill Alexanders MD 10/21/2015 10:13 AM  [facility] [address] [place], [place] [address]  Phone [phone] Fax [phone]

## 2015-10-27 DIAGNOSIS — C221 Intrahepatic bile duct carcinoma: Secondary | ICD-10-CM | POA: Diagnosis not present

## 2015-10-27 DIAGNOSIS — E119 Type 2 diabetes mellitus without complications: Secondary | ICD-10-CM | POA: Diagnosis not present

## 2015-10-27 DIAGNOSIS — E782 Mixed hyperlipidemia: Secondary | ICD-10-CM | POA: Diagnosis not present

## 2015-10-29 DIAGNOSIS — I10 Essential (primary) hypertension: Secondary | ICD-10-CM | POA: Diagnosis not present

## 2015-10-29 DIAGNOSIS — E782 Mixed hyperlipidemia: Secondary | ICD-10-CM | POA: Diagnosis not present

## 2015-10-29 DIAGNOSIS — G20 Parkinson's disease: Secondary | ICD-10-CM | POA: Diagnosis not present

## 2015-10-29 DIAGNOSIS — E119 Type 2 diabetes mellitus without complications: Secondary | ICD-10-CM | POA: Diagnosis not present

## 2015-11-04 ENCOUNTER — Other Ambulatory Visit (HOSPITAL_COMMUNITY): Payer: Self-pay | Admitting: Surgery

## 2015-11-04 DIAGNOSIS — C221 Intrahepatic bile duct carcinoma: Secondary | ICD-10-CM

## 2015-11-04 DIAGNOSIS — C249 Malignant neoplasm of biliary tract, unspecified: Principal | ICD-10-CM

## 2015-11-05 NOTE — Telephone Encounter (Signed)
Reviewed FNA/cytology, atypical cells suspicious for adenocarcinoma. Patient offered referral to oncology but had not decided. ERCP scheduled at end of month at [facility].  FYI Dr. Gala Romney.

## 2015-11-19 DIAGNOSIS — C221 Intrahepatic bile duct carcinoma: Secondary | ICD-10-CM | POA: Diagnosis not present

## 2015-11-19 DIAGNOSIS — K831 Obstruction of bile duct: Secondary | ICD-10-CM | POA: Diagnosis not present

## 2015-11-19 DIAGNOSIS — Z9102 Food additives allergy status: Secondary | ICD-10-CM | POA: Diagnosis not present

## 2015-11-19 DIAGNOSIS — E119 Type 2 diabetes mellitus without complications: Secondary | ICD-10-CM | POA: Diagnosis not present

## 2015-11-19 DIAGNOSIS — G4730 Sleep apnea, unspecified: Secondary | ICD-10-CM | POA: Diagnosis not present

## 2015-11-19 DIAGNOSIS — I10 Essential (primary) hypertension: Secondary | ICD-10-CM | POA: Diagnosis not present

## 2015-11-19 DIAGNOSIS — Z880 Allergy status to penicillin: Secondary | ICD-10-CM | POA: Diagnosis not present

## 2015-11-19 DIAGNOSIS — K219 Gastro-esophageal reflux disease without esophagitis: Secondary | ICD-10-CM | POA: Diagnosis not present

## 2015-11-19 DIAGNOSIS — Z7984 Long term (current) use of oral hypoglycemic drugs: Secondary | ICD-10-CM | POA: Diagnosis not present

## 2015-11-19 DIAGNOSIS — G20 Parkinson's disease: Secondary | ICD-10-CM | POA: Diagnosis not present

## 2015-11-19 DIAGNOSIS — Z7982 Long term (current) use of aspirin: Secondary | ICD-10-CM | POA: Diagnosis not present

## 2015-11-19 DIAGNOSIS — Z79899 Other long term (current) drug therapy: Secondary | ICD-10-CM | POA: Diagnosis not present

## 2015-11-22 ENCOUNTER — Ambulatory Visit (HOSPITAL_COMMUNITY): Payer: PPO

## 2015-12-01 DIAGNOSIS — Z23 Encounter for immunization: Secondary | ICD-10-CM | POA: Diagnosis not present

## 2015-12-20 DIAGNOSIS — C249 Malignant neoplasm of biliary tract, unspecified: Secondary | ICD-10-CM | POA: Diagnosis not present

## 2015-12-23 ENCOUNTER — Ambulatory Visit (HOSPITAL_COMMUNITY)
Admission: RE | Admit: 2015-12-23 | Discharge: 2015-12-23 | Disposition: A | Discharge status: Home / Self Care | Payer: PPO | Source: Ambulatory Visit | Attending: Nurse Practitioner | Admitting: Nurse Practitioner

## 2015-12-23 ENCOUNTER — Other Ambulatory Visit (HOSPITAL_COMMUNITY): Payer: Self-pay | Admitting: Nurse Practitioner

## 2015-12-23 DIAGNOSIS — C249 Malignant neoplasm of biliary tract, unspecified: Secondary | ICD-10-CM | POA: Diagnosis not present

## 2015-12-23 DIAGNOSIS — K5730 Diverticulosis of large intestine without perforation or abscess without bleeding: Secondary | ICD-10-CM | POA: Insufficient documentation

## 2015-12-23 DIAGNOSIS — K4090 Unilateral inguinal hernia, without obstruction or gangrene, not specified as recurrent: Secondary | ICD-10-CM | POA: Insufficient documentation

## 2015-12-23 DIAGNOSIS — K8020 Calculus of gallbladder without cholecystitis without obstruction: Secondary | ICD-10-CM | POA: Insufficient documentation

## 2015-12-23 DIAGNOSIS — R590 Localized enlarged lymph nodes: Secondary | ICD-10-CM | POA: Insufficient documentation

## 2015-12-23 DIAGNOSIS — I700 Atherosclerosis of aorta: Secondary | ICD-10-CM | POA: Insufficient documentation

## 2015-12-23 DIAGNOSIS — C221 Intrahepatic bile duct carcinoma: Secondary | ICD-10-CM

## 2015-12-23 DIAGNOSIS — I2510 Atherosclerotic heart disease of native coronary artery without angina pectoris: Secondary | ICD-10-CM | POA: Diagnosis not present

## 2015-12-23 MED ORDER — IOPAMIDOL (ISOVUE-300) INJECTION 61%
100.0000 mL | Freq: Once | INTRAVENOUS | Status: AC | PRN
  Administered 2015-12-23: 100 mL via INTRAVENOUS

## 2016-02-02 DIAGNOSIS — H17822 Peripheral opacity of cornea, left eye: Secondary | ICD-10-CM | POA: Diagnosis not present

## 2016-02-02 DIAGNOSIS — H1711 Central corneal opacity, right eye: Secondary | ICD-10-CM | POA: Diagnosis not present

## 2016-02-02 DIAGNOSIS — H25013 Cortical age-related cataract, bilateral: Secondary | ICD-10-CM | POA: Diagnosis not present

## 2016-02-02 DIAGNOSIS — H401132 Primary open-angle glaucoma, bilateral, moderate stage: Secondary | ICD-10-CM | POA: Diagnosis not present

## 2016-02-29 DIAGNOSIS — Z8719 Personal history of other diseases of the digestive system: Secondary | ICD-10-CM | POA: Diagnosis not present

## 2016-02-29 DIAGNOSIS — K831 Obstruction of bile duct: Secondary | ICD-10-CM | POA: Diagnosis not present

## 2016-02-29 DIAGNOSIS — Z4589 Encounter for adjustment and management of other implanted devices: Secondary | ICD-10-CM | POA: Diagnosis not present

## 2016-02-29 DIAGNOSIS — I10 Essential (primary) hypertension: Secondary | ICD-10-CM | POA: Diagnosis not present

## 2016-02-29 DIAGNOSIS — E119 Type 2 diabetes mellitus without complications: Secondary | ICD-10-CM | POA: Diagnosis not present

## 2016-02-29 DIAGNOSIS — C249 Malignant neoplasm of biliary tract, unspecified: Secondary | ICD-10-CM | POA: Diagnosis not present

## 2016-03-22 DIAGNOSIS — J3489 Other specified disorders of nose and nasal sinuses: Secondary | ICD-10-CM | POA: Diagnosis not present

## 2016-03-22 DIAGNOSIS — G20 Parkinson's disease: Secondary | ICD-10-CM | POA: Diagnosis not present

## 2016-03-22 DIAGNOSIS — J060 Acute laryngopharyngitis: Secondary | ICD-10-CM | POA: Diagnosis not present

## 2016-03-23 ENCOUNTER — Ambulatory Visit (INDEPENDENT_AMBULATORY_CARE_PROVIDER_SITE_OTHER): Payer: PPO | Admitting: Neurology

## 2016-03-23 ENCOUNTER — Encounter: Payer: Self-pay | Admitting: Neurology

## 2016-03-23 VITALS — BP 141/75 | HR 58 | Ht 70.0 in | Wt 234.0 lb

## 2016-03-23 DIAGNOSIS — G20 Parkinson's disease: Secondary | ICD-10-CM | POA: Diagnosis not present

## 2016-03-23 MED ORDER — SELEGILINE HCL 5 MG PO TABS: 5.0000 mg | ORAL_TABLET | Freq: Two times a day (BID) | ORAL | 5 refills | Status: DC

## 2016-03-23 NOTE — Patient Instructions (Signed)
   With the Selegiline 5 mg tablet, start one in the morning for 2 weeks, then take one in the morning and one at lunchtime.

## 2016-03-23 NOTE — Progress Notes (Signed)
Reason for visit: Parkinson's disease  Elijah [name] is an 76 y.o. male  History of present illness:  Elijah [name] is a 76 year old left-handed white male with a history of Parkinson's disease. The patient has had a tendency to lean to the right, he has not had any falls because of this. He may use a cane or even a walker at times. The patient is on Sinemet and Mirapex. He does have drowsiness with the medications. He does not sleep well at night due to frequency of urination, he may have to get up at least 3 times at night to urinate. The patient is now starting to have some problems with freezing when he tries to initiate ambulation or when he goes through a door. The patient has not had any falls because of this. He denies any difficulty swallowing or choking. He tries to stay active. He has been evaluated for possible cholangiocarcinoma, but a recent evaluation in early January showed no malignancy with pathology from brushings. He returns for an evaluation.  Past Medical History:  Diagnosis Date  . Chronic low back pain 03/24/2015  . Degenerative arthritis   . Diabetes mellitus without complication ([place])   . Dyslipidemia   . Glaucoma   . Gout 10/07/2012  . Hypertension   . Low back pain   . Obesity   . OSA (obstructive sleep apnea) 04/10/2014  . Pancreatitis   . Parkinson disease ([place])   . Rotator cuff tear    Bilateral, no surgery    Past Surgical History:  Procedure Laterality Date  . APPENDECTOMY    . BALLOON DILATION N/A 09/06/2015   Procedure: BALLOON DILATION;  Surgeon: Daneil Dolin, MD;  [facility];  Service: Endoscopy;  Laterality: N/A;  . BILIARY STENT PLACEMENT N/A 09/08/2015   Procedure: BILIARY STENT PLACEMENT;  Surgeon: Daneil Dolin, MD;  [facility];  Service: Endoscopy;  Laterality: N/A;  . ERCP N/A 09/06/2015   Procedure: ENDOSCOPIC RETROGRADE CHOLANGIOPANCREATOGRAPHY (ERCP);  Surgeon: Daneil Dolin, MD;  [facility];   Service: Endoscopy;  Laterality: N/A;  . ERCP N/A 09/08/2015   Procedure: ENDOSCOPIC RETROGRADE CHOLANGIOPANCREATOGRAPHY (ERCP);  Surgeon: Daneil Dolin, MD;  [facility];  Service: Endoscopy;  Laterality: N/A;  with bile duct brushings  . HERNIA REPAIR    . SHOULDER BONE SPUR Left    RESECTION  . SPHINCTEROTOMY N/A 09/06/2015   Procedure: SPHINCTEROTOMY;  Surgeon: Daneil Dolin, MD;  [facility];  Service: Endoscopy;  Laterality: N/A;  . TONSILLECTOMY      Family History  Problem Relation Age of Onset  . Stroke Mother   . Diabetes Mother   . Heart Problems Mother   . Parkinsonism Father   . Heart Problems Father     Social history:  reports that he has never smoked. He has never used smokeless tobacco. He reports that he does not drink alcohol or use drugs.    Allergies  Allergen Reactions  . Aspirin Other (See Comments)    Stomach ulcers.  . Coconut Oil     Other reaction(s): GI Upset (intolerance)  . Penicillins Rash    Has patient had a PCN reaction causing immediate rash, facial/tongue/throat swelling, SOB or lightheadedness with hypotension: no Has patient had a PCN reaction causing severe rash involving mucus membranes or skin necrosis: No Has patient had a PCN reaction that required hospitalization No Has patient had a PCN reaction occurring within  the last 10 years: No If all of the above answers are "NO", then may proceed with Cephalosporin use.     Medications:  Prior to Admission medications   Medication Sig Start Date End Date Taking? Authorizing Provider  ACCU-CHEK AVIVA PLUS test strip  12/05/13  Yes Historical Provider, MD  allopurinol (ZYLOPRIM) 100 MG tablet Take 100 mg by mouth daily.  10/05/12  Yes Historical Provider, MD  aspirin 81 MG tablet Take 81 mg by mouth daily.   Yes Historical Provider, MD  Azelastine HCl 0.15 % SOLN Place 1 spray into the nose daily as needed. 08/18/15  Yes Historical Provider, MD  benzonatate  (TESSALON) 200 MG capsule Take 200 mg by mouth 2 (two) times daily. 03/17/16  Yes Historical Provider, MD  carbidopa-levodopa (SINEMET CR) 50-200 MG tablet TAKE ONE TABLET FOUR TIMES A DAY 10/21/15  Yes Kathrynn Ducking, MD  cyclobenzaprine (FLEXERIL) 10 MG tablet Take 1 tablet (10 mg total) by mouth 2 (two) times daily as needed for muscle spasms. 07/02/12  Yes Teressa Lower, MD  furosemide (LASIX) 20 MG tablet Take 1 tablet by mouth 2 (two) times daily as needed for fluid or edema.  10/02/13  Yes Historical Provider, MD  LORazepam (ATIVAN) 1 MG tablet Take 1 mg by mouth as needed for anxiety.   Yes Historical Provider, MD  meloxicam (MOBIC) 15 MG tablet Take 15 mg by mouth daily as needed for pain.  09/30/12  Yes Historical Provider, MD  metFORMIN (GLUCOPHAGE) 500 MG tablet Take 500 mg by mouth 2 (two) times daily with a meal.  10/05/12  Yes Historical Provider, MD  metoprolol tartrate (LOPRESSOR) 25 MG tablet Take 12.5 mg by mouth daily. 1/2 TABLET DAILY   Yes Historical Provider, MD  pantoprazole (PROTONIX) 40 MG tablet Take 40 mg by mouth daily. 03/08/14  Yes Historical Provider, MD  potassium chloride SA (K-DUR,KLOR-CON) 20 MEQ tablet Take 1 tablet by mouth daily.  10/24/13  Yes Historical Provider, MD  pramipexole (MIRAPEX) 1.5 MG tablet Take 1 tablet (1.5 mg total) by mouth 3 (three) times daily. 10/21/15  Yes Kathrynn Ducking, MD  SALINE MIST SPRAY NA Place 1 spray into the nose as needed.    Yes Historical Provider, MD  simvastatin (ZOCOR) 40 MG tablet Take 20 mg by mouth at bedtime.  09/05/12  Yes Historical Provider, MD  valsartan-hydrochlorothiazide (DIOVAN-HCT) 80-12.5 MG per tablet Take 1 tablet by mouth daily.   Yes Historical Provider, MD  selegiline (ELDEPRYL) 5 MG tablet Take 1 tablet (5 mg total) by mouth 2 (two) times daily with a meal. 03/23/16   Kathrynn Ducking, MD    ROS:  Out of a complete 14 system review of symptoms, the patient complains only of the following symptoms, and all other  reviewed systems are negative.  Gait disorder Tremor  Blood pressure (!) 141/75, pulse (!) 58, height 5\' 10"  (1.778 m), weight 234 lb (106.1 kg).  Physical Exam  General: The patient is alert and cooperative at the time of the examination. The patient is markedly obese.  Skin: 2+ edema below the knees is noted bilaterally.   Neurologic Exam  Mental status: The patient is alert and oriented x 3 at the time of the examination. The patient has apparent normal recent and remote memory, with an apparently normal attention span and concentration ability.   Cranial nerves: Facial symmetry is present. Speech is normal, no aphasia or dysarthria is noted. Extraocular movements are full. Visual fields are full.  Masking of the face is seen.  Motor: The patient has good strength in all 4 extremities.  Sensory examination: Soft touch sensation is symmetric on the face, arms, and legs.  Coordination: The patient has good finger-nose-finger and heel-to-shin bilaterally. Prominent resting tremors noted with the left upper extremity.  Gait and station: The patient has a normal gait, but there is decreased arm swing on the left tremor with walking. The patient has a kyphotic posture, leans to the right. The patient is able to get up from a seated position with arms crossed with some difficulty. Tandem gait is slightly unsteady. Romberg is negative. No drift is seen.  Reflexes: Deep tendon reflexes are symmetric.   Assessment/Plan:  1. Parkinson's disease  2. Mild gait disorder  The patient is having a very definite tendency to lean to the right, he does have a kyphosis, and a left upper extremity tremor. The patient is having freezing spells at this point, we will add selegiline to see if this can help some of his freezing and improved some the daytime drowsiness. He will follow-up in 5 months. He will contact our office if he is not doing well. He is to remain active.  Jill Alexanders  MD 03/23/2016 11:30 AM  [facility] [address] [place], [place] [address]  Phone [phone] Fax [phone]

## 2016-04-05 ENCOUNTER — Other Ambulatory Visit (HOSPITAL_COMMUNITY): Payer: Self-pay | Admitting: *Deleted

## 2016-04-06 ENCOUNTER — Other Ambulatory Visit (HOSPITAL_COMMUNITY): Payer: Self-pay | Admitting: *Deleted

## 2016-04-06 DIAGNOSIS — K831 Obstruction of bile duct: Secondary | ICD-10-CM

## 2016-04-14 ENCOUNTER — Ambulatory Visit (HOSPITAL_COMMUNITY): Payer: PPO

## 2016-04-27 ENCOUNTER — Other Ambulatory Visit: Payer: Self-pay | Admitting: Neurology

## 2016-05-15 DIAGNOSIS — E119 Type 2 diabetes mellitus without complications: Secondary | ICD-10-CM | POA: Diagnosis not present

## 2016-05-15 DIAGNOSIS — C259 Malignant neoplasm of pancreas, unspecified: Secondary | ICD-10-CM | POA: Diagnosis not present

## 2016-05-15 DIAGNOSIS — I10 Essential (primary) hypertension: Secondary | ICD-10-CM | POA: Diagnosis not present

## 2016-05-17 DIAGNOSIS — G20 Parkinson's disease: Secondary | ICD-10-CM | POA: Diagnosis not present

## 2016-05-17 DIAGNOSIS — I10 Essential (primary) hypertension: Secondary | ICD-10-CM | POA: Diagnosis not present

## 2016-05-17 DIAGNOSIS — E119 Type 2 diabetes mellitus without complications: Secondary | ICD-10-CM | POA: Diagnosis not present

## 2016-05-17 DIAGNOSIS — E782 Mixed hyperlipidemia: Secondary | ICD-10-CM | POA: Diagnosis not present

## 2016-05-23 ENCOUNTER — Ambulatory Visit (HOSPITAL_COMMUNITY)
Admission: RE | Admit: 2016-05-23 | Discharge: 2016-05-23 | Disposition: A | Discharge status: Home / Self Care | Payer: PPO | Source: Ambulatory Visit | Attending: *Deleted | Admitting: *Deleted

## 2016-05-23 DIAGNOSIS — K8689 Other specified diseases of pancreas: Secondary | ICD-10-CM | POA: Diagnosis not present

## 2016-05-23 DIAGNOSIS — N281 Cyst of kidney, acquired: Secondary | ICD-10-CM | POA: Diagnosis not present

## 2016-05-23 DIAGNOSIS — N200 Calculus of kidney: Secondary | ICD-10-CM | POA: Diagnosis not present

## 2016-05-23 DIAGNOSIS — K838 Other specified diseases of biliary tract: Secondary | ICD-10-CM | POA: Diagnosis not present

## 2016-05-23 DIAGNOSIS — K831 Obstruction of bile duct: Secondary | ICD-10-CM | POA: Diagnosis not present

## 2016-05-23 MED ORDER — IOPAMIDOL (ISOVUE-300) INJECTION 61%
100.0000 mL | Freq: Once | INTRAVENOUS | Status: AC | PRN
  Administered 2016-05-23: 100 mL via INTRAVENOUS

## 2016-05-24 ENCOUNTER — Telehealth: Payer: Self-pay | Admitting: Neurology

## 2016-05-24 ENCOUNTER — Ambulatory Visit (HOSPITAL_COMMUNITY): Payer: PPO

## 2016-05-24 MED ORDER — PRAMIPEXOLE DIHYDROCHLORIDE 1.5 MG PO TABS: 1.5000 mg | ORAL_TABLET | Freq: Three times a day (TID) | ORAL | 5 refills | Status: DC

## 2016-05-24 NOTE — Telephone Encounter (Signed)
E-scribed rx to pt pharmacy as requested. 

## 2016-05-24 NOTE — Addendum Note (Signed)
Addended by: Hope Pigeon on: 05/24/2016 02:43 PM   Modules accepted: Orders

## 2016-05-24 NOTE — Telephone Encounter (Signed)
Patient requesting new Rx for pramipexole (MIRAPEX) 1.5 MG tablet called to [facility]. Patient is changing pharmacies.

## 2016-06-26 DIAGNOSIS — Z7984 Long term (current) use of oral hypoglycemic drugs: Secondary | ICD-10-CM | POA: Diagnosis not present

## 2016-06-26 DIAGNOSIS — Z7982 Long term (current) use of aspirin: Secondary | ICD-10-CM | POA: Diagnosis not present

## 2016-06-26 DIAGNOSIS — K8689 Other specified diseases of pancreas: Secondary | ICD-10-CM | POA: Diagnosis not present

## 2016-06-26 DIAGNOSIS — Z8509 Personal history of malignant neoplasm of other digestive organs: Secondary | ICD-10-CM | POA: Diagnosis not present

## 2016-06-26 DIAGNOSIS — R600 Localized edema: Secondary | ICD-10-CM | POA: Diagnosis not present

## 2016-06-26 DIAGNOSIS — K831 Obstruction of bile duct: Secondary | ICD-10-CM | POA: Diagnosis not present

## 2016-06-26 DIAGNOSIS — E119 Type 2 diabetes mellitus without complications: Secondary | ICD-10-CM | POA: Diagnosis not present

## 2016-06-26 DIAGNOSIS — R000 Tachycardia, unspecified: Secondary | ICD-10-CM | POA: Diagnosis not present

## 2016-06-26 DIAGNOSIS — K838 Other specified diseases of biliary tract: Secondary | ICD-10-CM | POA: Diagnosis not present

## 2016-06-26 DIAGNOSIS — Z79899 Other long term (current) drug therapy: Secondary | ICD-10-CM | POA: Diagnosis not present

## 2016-06-26 DIAGNOSIS — Z8719 Personal history of other diseases of the digestive system: Secondary | ICD-10-CM | POA: Diagnosis not present

## 2016-06-26 DIAGNOSIS — G4733 Obstructive sleep apnea (adult) (pediatric): Secondary | ICD-10-CM | POA: Diagnosis not present

## 2016-06-26 DIAGNOSIS — I10 Essential (primary) hypertension: Secondary | ICD-10-CM | POA: Diagnosis not present

## 2016-06-26 DIAGNOSIS — G20 Parkinson's disease: Secondary | ICD-10-CM | POA: Diagnosis not present

## 2016-06-26 DIAGNOSIS — Z791 Long term (current) use of non-steroidal anti-inflammatories (NSAID): Secondary | ICD-10-CM | POA: Diagnosis not present

## 2016-10-16 DIAGNOSIS — C221 Intrahepatic bile duct carcinoma: Secondary | ICD-10-CM | POA: Diagnosis not present

## 2016-10-17 ENCOUNTER — Ambulatory Visit: Payer: PPO | Admitting: Neurology

## 2016-11-07 ENCOUNTER — Encounter: Payer: Self-pay | Admitting: Neurology

## 2016-11-07 ENCOUNTER — Ambulatory Visit (INDEPENDENT_AMBULATORY_CARE_PROVIDER_SITE_OTHER): Payer: PPO | Admitting: Neurology

## 2016-11-07 VITALS — BP 136/76 | HR 61 | Ht 70.0 in | Wt 229.0 lb

## 2016-11-07 DIAGNOSIS — G20A1 Parkinson's disease without dyskinesia, without mention of fluctuations: Secondary | ICD-10-CM

## 2016-11-07 DIAGNOSIS — G20 Parkinson's disease: Secondary | ICD-10-CM

## 2016-11-07 MED ORDER — CARBIDOPA-LEVODOPA ER 50-200 MG PO TBCR
EXTENDED_RELEASE_TABLET | ORAL | 3 refills | Status: DC
Start: 2016-11-07 — End: 2017-11-01

## 2016-11-07 MED ORDER — PRAMIPEXOLE DIHYDROCHLORIDE 1.5 MG PO TABS: 1.5000 mg | ORAL_TABLET | Freq: Three times a day (TID) | ORAL | 3 refills | Status: DC

## 2016-11-07 MED ORDER — SELEGILINE HCL 5 MG PO TABS: 5.0000 mg | ORAL_TABLET | Freq: Two times a day (BID) | ORAL | 3 refills | Status: DC

## 2016-11-07 NOTE — Progress Notes (Signed)
Reason for visit: Parkinson's disease  Elijah [name] is an 76 y.o. male  History of present illness:  Elijah [name] is a 76 year old left-handed white male with a history of Parkinson's disease. The patient is having some occasional events of freezing when he makes a turn or has to go through a door. The patient has difficulty with leaning to the right, he does have some back pain and right leg pain because of this. He has a tendency to lean on his right arm, he has developed some ulnar distribution weakness in the right hand. The patient sleeps fairly well at night, he does have some daytime sleepiness on occasion. He remains extremely active managing his garden, working on his car, and cutting grass and doing yard work. The patient has not fallen since last seen. He indicates that the episodes of freezing do not appear to be related to dosing of medication. He returns to the office for an evaluation. He has had addition of selegiline since last seen, he does not believe that this has improved the frequency of freezing, but the episodes have not worsened over time either.  Past Medical History:  Diagnosis Date  . Chronic low back pain 03/24/2015  . Degenerative arthritis   . Diabetes mellitus without complication ([place])   . Dyslipidemia   . Glaucoma   . Gout 10/07/2012  . Hypertension   . Low back pain   . Obesity   . OSA (obstructive sleep apnea) 04/10/2014  . Pancreatitis   . Parkinson disease ([place])   . Rotator cuff tear    Bilateral, no surgery    Past Surgical History:  Procedure Laterality Date  . APPENDECTOMY    . BALLOON DILATION N/A 09/06/2015   Procedure: BALLOON DILATION;  Surgeon: Daneil Dolin, MD;  [facility];  Service: Endoscopy;  Laterality: N/A;  . BILIARY STENT PLACEMENT N/A 09/08/2015   Procedure: BILIARY STENT PLACEMENT;  Surgeon: Daneil Dolin, MD;  [facility];  Service: Endoscopy;  Laterality: N/A;  . ERCP N/A 09/06/2015   Procedure:  ENDOSCOPIC RETROGRADE CHOLANGIOPANCREATOGRAPHY (ERCP);  Surgeon: Daneil Dolin, MD;  [facility];  Service: Endoscopy;  Laterality: N/A;  . ERCP N/A 09/08/2015   Procedure: ENDOSCOPIC RETROGRADE CHOLANGIOPANCREATOGRAPHY (ERCP);  Surgeon: Daneil Dolin, MD;  [facility];  Service: Endoscopy;  Laterality: N/A;  with bile duct brushings  . HERNIA REPAIR    . SHOULDER BONE SPUR Left    RESECTION  . SPHINCTEROTOMY N/A 09/06/2015   Procedure: SPHINCTEROTOMY;  Surgeon: Daneil Dolin, MD;  [facility];  Service: Endoscopy;  Laterality: N/A;  . TONSILLECTOMY      Family History  Problem Relation Age of Onset  . Stroke Mother   . Diabetes Mother   . Heart Problems Mother   . Parkinsonism Father   . Heart Problems Father     Social history:  reports that he has never smoked. He has never used smokeless tobacco. He reports that he does not drink alcohol or use drugs.    Allergies  Allergen Reactions  . Aspirin Other (See Comments)    Stomach ulcers.  . Coconut Oil     Other reaction(s): GI Upset (intolerance)  . Penicillins Rash    Has patient had a PCN reaction causing immediate rash, facial/tongue/throat swelling, SOB or lightheadedness with hypotension: no Has patient had a PCN reaction causing severe rash involving mucus membranes or skin necrosis: No Has  patient had a PCN reaction that required hospitalization No Has patient had a PCN reaction occurring within the last 10 years: No If all of the above answers are "NO", then may proceed with Cephalosporin use.     Medications:  Prior to Admission medications   Medication Sig Start Date End Date Taking? Authorizing Provider  ACCU-CHEK AVIVA PLUS test strip  12/05/13  Yes [provider]  allopurinol (ZYLOPRIM) 100 MG tablet Take 100 mg by mouth daily.  10/05/12  Yes [provider]  aspirin 81 MG tablet Take 81 mg by mouth daily.   Yes [provider]  Azelastine HCl  0.15 % SOLN Place 1 spray into the nose daily as needed. 08/18/15  Yes [provider]  benzonatate (TESSALON) 200 MG capsule Take 200 mg by mouth 2 (two) times daily. 03/17/16  Yes [provider]  carbidopa-levodopa (SINEMET CR) 50-200 MG tablet TAKE (1) TABLET BY MOUTH (4) TIMES DAILY. 04/27/16  Yes Kathrynn Ducking, MD  cyclobenzaprine (FLEXERIL) 10 MG tablet Take 1 tablet (10 mg total) by mouth 2 (two) times daily as needed for muscle spasms. 07/02/12  Yes Teressa Lower, MD  furosemide (LASIX) 20 MG tablet Take 1 tablet by mouth 2 (two) times daily as needed for fluid or edema.  10/02/13  Yes [provider]  LORazepam (ATIVAN) 1 MG tablet Take 1 mg by mouth as needed for anxiety.   Yes [provider]  meloxicam (MOBIC) 15 MG tablet Take 15 mg by mouth daily as needed for pain.  09/30/12  Yes [provider]  metFORMIN (GLUCOPHAGE) 500 MG tablet Take 500 mg by mouth 2 (two) times daily with a meal.  10/05/12  Yes [provider]  metoprolol tartrate (LOPRESSOR) 25 MG tablet Take 12.5 mg by mouth daily. 1/2 TABLET DAILY   Yes [provider]  pantoprazole (PROTONIX) 40 MG tablet Take 40 mg by mouth daily. 03/08/14  Yes [provider]  potassium chloride SA (K-DUR,KLOR-CON) 20 MEQ tablet Take 1 tablet by mouth daily.  10/24/13  Yes [provider]  pramipexole (MIRAPEX) 1.5 MG tablet Take 1 tablet (1.5 mg total) by mouth 3 (three) times daily. 05/24/16  Yes Kathrynn Ducking, MD  SALINE MIST SPRAY NA Place 1 spray into the nose as needed.    Yes [provider]  selegiline (ELDEPRYL) 5 MG tablet Take 1 tablet (5 mg total) by mouth 2 (two) times daily with a meal. 03/23/16  Yes Kathrynn Ducking, MD  simvastatin (ZOCOR) 40 MG tablet Take 20 mg by mouth at bedtime.  09/05/12  Yes [provider]  valsartan-hydrochlorothiazide (DIOVAN-HCT) 80-12.5 MG per tablet Take 1 tablet by mouth daily.   Yes [provider]    ROS:  Out of a complete 14 system review of symptoms, the patient complains only of the following symptoms, and all other reviewed systems are negative.  Back pain  Blood pressure 136/76, pulse 61, height 5\' 10"  (1.778 m), weight 229 lb (103.9 kg).  Physical Exam  General: The patient is alert and cooperative at the time of the examination. The patient is moderately obese.  Neuromuscular: The patient will lean to the right with sitting and standing.  Skin: No significant peripheral edema is noted.   Neurologic Exam  Mental status: The patient is alert and oriented x 3 at the time of the examination. The patient has apparent normal recent and remote memory, with an apparently normal attention span and concentration  ability.   Cranial nerves: Facial symmetry is present. Speech is normal, no aphasia or dysarthria is noted. Extraocular movements are full. Visual fields are full. Mild masking of the face is seen.  Motor: The patient has good strength in all 4 extremities, with exception of some weakness of intrinsic muscles of the right hand.  Sensory examination: Soft touch sensation is symmetric on the face, arms, and legs.  Coordination: The patient has good finger-nose-finger and heel-to-shin bilaterally.  Gait and station: The patient is able to arise from a seated position with arms crossed with some difficulty. Once up, he can walk without a cane. He has slight slowness with turns, no freezing was seen. Romberg is negative.  Reflexes: Deep tendon reflexes are symmetric.   Assessment/Plan:  1. Parkinson's disease  2. Low back pain  The patient is developing low back pain associated with leaning to the right. The patient does have some episodes of freezing, this is not related to medication dosing. He is remaining active, his physical abilities have not changed since last seen. Prescriptions were given for the Sinemet, selegiline, and Mirapex. He will follow-up in 5  months. The patient does have a lumbar support device for his back which does help some, but it is too hot to wear in the summertime.  Jill Alexanders MD 11/07/2016 11:22 AM  [facility] [address] [place], [place] [address]  Phone [phone] Fax [phone]

## 2016-11-13 DIAGNOSIS — E1100 Type 2 diabetes mellitus with hyperosmolarity without nonketotic hyperglycemic-hyperosmolar coma (NKHHC): Secondary | ICD-10-CM | POA: Diagnosis not present

## 2016-11-13 DIAGNOSIS — I10 Essential (primary) hypertension: Secondary | ICD-10-CM | POA: Diagnosis not present

## 2016-11-15 DIAGNOSIS — Z23 Encounter for immunization: Secondary | ICD-10-CM | POA: Diagnosis not present

## 2016-11-15 DIAGNOSIS — M25551 Pain in right hip: Secondary | ICD-10-CM | POA: Diagnosis not present

## 2016-11-15 DIAGNOSIS — I10 Essential (primary) hypertension: Secondary | ICD-10-CM | POA: Diagnosis not present

## 2016-11-15 DIAGNOSIS — E1100 Type 2 diabetes mellitus with hyperosmolarity without nonketotic hyperglycemic-hyperosmolar coma (NKHHC): Secondary | ICD-10-CM | POA: Diagnosis not present

## 2016-11-15 DIAGNOSIS — E782 Mixed hyperlipidemia: Secondary | ICD-10-CM | POA: Diagnosis not present

## 2016-11-15 DIAGNOSIS — Z6831 Body mass index (BMI) 31.0-31.9, adult: Secondary | ICD-10-CM | POA: Diagnosis not present

## 2016-11-15 DIAGNOSIS — R600 Localized edema: Secondary | ICD-10-CM | POA: Diagnosis not present

## 2016-11-15 DIAGNOSIS — K828 Other specified diseases of gallbladder: Secondary | ICD-10-CM | POA: Diagnosis not present

## 2016-11-15 DIAGNOSIS — E119 Type 2 diabetes mellitus without complications: Secondary | ICD-10-CM | POA: Diagnosis not present

## 2016-11-15 DIAGNOSIS — M545 Low back pain: Secondary | ICD-10-CM | POA: Diagnosis not present

## 2016-11-15 DIAGNOSIS — G20 Parkinson's disease: Secondary | ICD-10-CM | POA: Diagnosis not present

## 2016-12-07 ENCOUNTER — Ambulatory Visit (HOSPITAL_COMMUNITY): Payer: PPO | Attending: Internal Medicine | Admitting: Physical Therapy

## 2016-12-07 ENCOUNTER — Encounter (HOSPITAL_COMMUNITY): Payer: Self-pay | Admitting: Physical Therapy

## 2016-12-07 DIAGNOSIS — R29898 Other symptoms and signs involving the musculoskeletal system: Secondary | ICD-10-CM

## 2016-12-07 DIAGNOSIS — G8929 Other chronic pain: Secondary | ICD-10-CM | POA: Diagnosis not present

## 2016-12-07 DIAGNOSIS — M6281 Muscle weakness (generalized): Secondary | ICD-10-CM | POA: Diagnosis not present

## 2016-12-07 DIAGNOSIS — M25551 Pain in right hip: Secondary | ICD-10-CM

## 2016-12-07 DIAGNOSIS — R293 Abnormal posture: Secondary | ICD-10-CM | POA: Diagnosis not present

## 2016-12-07 DIAGNOSIS — R262 Difficulty in walking, not elsewhere classified: Secondary | ICD-10-CM

## 2016-12-07 DIAGNOSIS — M545 Low back pain: Secondary | ICD-10-CM | POA: Diagnosis not present

## 2016-12-07 NOTE — Therapy (Signed)
[place], [place], [address] Phone: [phone]   Fax:  [phone]  Physical Therapy Evaluation  Patient Details  Name: Elijah [name] MRN: [id number] Date of Birth: [birth year] Referring Provider: Celene Squibb   Encounter Date: 12/07/2016      PT End of Session - 12/07/16 1209    Visit Number 1   Number of Visits 17   Date for PT Re-Evaluation 01/04/17   Authorization Type [employer]    Authorization Time Period 12/07/16 to 02/06/17   Authorization - Visit Number 1   Authorization - Number of Visits 10   PT Start Time 1194   PT Stop Time 1114   PT Time Calculation (min) 42 min   Activity Tolerance Patient tolerated treatment well   Behavior During Therapy [facility] for tasks assessed/performed      Past Medical History:  Diagnosis Date  . Chronic low back pain 03/24/2015  . Degenerative arthritis   . Diabetes mellitus without complication ([place])   . Dyslipidemia   . Glaucoma   . Gout 10/07/2012  . Hypertension   . Low back pain   . Obesity   . OSA (obstructive sleep apnea) 04/10/2014  . Pancreatitis   . Parkinson disease ([place])   . Rotator cuff tear    Bilateral, no surgery    Past Surgical History:  Procedure Laterality Date  . APPENDECTOMY    . BALLOON DILATION N/A 09/06/2015   Procedure: BALLOON DILATION;  Surgeon: Daneil Dolin, MD;  [facility];  Service: Endoscopy;  Laterality: N/A;  . BILIARY STENT PLACEMENT N/A 09/08/2015   Procedure: BILIARY STENT PLACEMENT;  Surgeon: Daneil Dolin, MD;  [facility];  Service: Endoscopy;  Laterality: N/A;  . ERCP N/A 09/06/2015   Procedure: ENDOSCOPIC RETROGRADE CHOLANGIOPANCREATOGRAPHY (ERCP);  Surgeon: Daneil Dolin, MD;  [facility];  Service: Endoscopy;  Laterality: N/A;  . ERCP N/A 09/08/2015   Procedure: ENDOSCOPIC RETROGRADE CHOLANGIOPANCREATOGRAPHY (ERCP);  Surgeon: Daneil Dolin, MD;  [facility];  Service: Endoscopy;  Laterality: N/A;  with bile duct brushings  . HERNIA REPAIR    . SHOULDER BONE SPUR Left    RESECTION  . SPHINCTEROTOMY N/A 09/06/2015   Procedure: SPHINCTEROTOMY;  Surgeon: Daneil Dolin, MD;  [facility];  Service: Endoscopy;  Laterality: N/A;  . TONSILLECTOMY      There were no vitals filed for this visit.       Subjective Assessment - 12/07/16 1034    Subjective Patient arrives stating that his hip and back are bothering him; he states this is from "getting old and Parkinsons", has been going on for 8 months or so. The pain slows him down and he feels it causes him to lean forward and to the right. No falls, he can have close calls if he is not careful. No numbness or tingling going down legs. His left hip feels like "the bone is rubbing"    How long can you sit comfortably? unlimited    How long can you stand comfortably? just a few minutes without walker; 10 minutes with walker    How long can you walk comfortably? not sure, maybe 5-10 minutes    Patient Stated Goals sit up and walk straight, get back to doing things I'd like to do    Currently in Pain? No/denies  at worst, 3-4/10  [facility] PT Assessment - 12/07/16 0001      Assessment   Medical Diagnosis R hip and low back pain    Referring Provider Celene Squibb    Onset Date/Surgical Date --  about 8 months ago    Next MD Visit Dr. Nevada Crane March 2019 unless needed sooner    Prior Therapy none      Precautions   Precautions Fall     Restrictions   Weight Bearing Restrictions No     Balance Screen   Has the patient fallen in the past 6 months No   Has the patient had a decrease in activity level because of a fear of falling?  Yes   Is the patient reluctant to leave their home because of a fear of falling?  No     Prior Function   Level of Independence Independent with transfers;Independent with gait;Needs assistance with homemaking;Needs assistance with ADLs    Vocation Retired   Leisure working outside in garden, cutting trees, doing what I want to do      Observation/Other Assessments   Observations scour (+) R LE; (-) L; FABER and SLR (-) bilateral      Posture/Postural Control   Posture Comments possible scoliosis- shoulders shifted R, hips shifted L      AROM   Lumbar Flexion moderate limitation    Lumbar Extension severe limitation    Lumbar - Right Side Bend unable to do without compensations   Lumbar - Left Side Bend unable to do without compensations      Strength   Right Hip Flexion 2/5   Right Hip ABduction 3+/5   Left Hip Flexion 3/5   Left Hip ABduction 4+/5   Right Knee Flexion 4+/5   Right Knee Extension 5/5   Left Knee Flexion 4+/5   Left Knee Extension 4+/5   Right Ankle Dorsiflexion 5/5   Left Ankle Dorsiflexion 5/5     Flexibility   Hamstrings mild limitation    Piriformis severe limitation      Transfers   Comments poor bed mechanics      Ambulation/Gait   Gait Comments trunk flexed, poor posture, reduced gait speed      High Level Balance   High Level Balance Comments TUG 18.3 with rollator; mild deficit backward walking             Objective measurements completed on examination: See above findings.                  PT Education - 12/07/16 1208    Education provided Yes   Education Details prognosis, exam findings, HEP, POC; possible dual role of chronic back pain related deficits and parkinsons disease contributing to overall pain and difficulty with mobilty    Person(s) Educated Patient;Spouse   Methods Explanation;Demonstration;Handout   Comprehension Verbalized understanding;Returned demonstration;Need further instruction          PT Short Term Goals - 12/07/16 1213      PT SHORT TERM GOAL #1   Title Patient to experience pain as being no more than 2/10 at worst in order to improve QOL and improve functional task performance    Time 4   Period Weeks   Status New    Target Date 01/04/17     PT SHORT TERM GOAL #2   Title Patient to be able to [facility] correct posture at least 50% of the time without external cues, patient and spouse also to be able to demonstrate placement  of pads/pillows to assist with postural correction with cues no more than 20% of the time    Time 4   Period Weeks   Status New     PT SHORT TERM GOAL #3   Title Patient to be independent with correct supine to sit/sit to supine mechanics in order to reduce stress on lumbar spine with functional mobility    Time 4   Period Weeks   Status New     PT SHORT TERM GOAL #4   Title Patient to be compliant with correct performance of HEP, to be updated as appropriate    Time 1   Period Weeks   Status New   Target Date 12/14/16           PT Long Term Goals - 12/07/16 1216      PT LONG TERM GOAL #1   Title Patient to demonstrate an improvement of at least 1 MMT grade in all tested groups in order to improve gait pattern  and balance   Time 8   Period Weeks   Status New   Target Date 02/01/17     PT LONG TERM GOAL #2   Title Patient to be able to complete TUG test in 14 seconds or less with LRAD in order to show improved mobility and balance    Time 8   Period Weeks   Status New     PT LONG TERM GOAL #3   Title Patient to demonstrate at least a 50% improvement in muscle flexibility deficits in order to reduce pain and improve mechanics    Time 8   Period Weeks   Status New     PT LONG TERM GOAL #4   Title Patient and spouse to be able to verbally explain the benefits of PWR/HIIT training for Parkinsons disease, and will be participatory in PWR based exercise program to assist in maintaining flexibility, trunk/hip mobility, and prevent worsening of back pain due to Parkinsonian changes    Time 8   Period Weeks   Status New                Plan - 12/07/16 1210    Clinical Impression Statement Patient arrives with complaints of chronic back and hip pain, which  started about 8 months ago; he reports he is most frustrated in not being able to get up off the floor easily, and would just like to move better and get back to his regular tasks in general. Examination reveals severe postural limitations, gait deviation, impaired functional strength, reduced muscle flexibility, and general unsteadiness. Suspect that back and hip pain as well as postural deviations may at least be partially related to Parkinsons diagnosis; also suspect possible scoliotic curves in spine as well. Recommend skilled PT services to address functional deficits and improve overall level of function moving forward.    History and Personal Factors relevant to plan of care: chronicity of pain, Parkinsons Disease, chronic rotator cuff tears    Clinical Presentation Stable   Clinical Presentation due to: biomechanical changes/impairments, deconditioning, Parkinsons    Clinical Decision Making Low   Rehab Potential Good   Clinical Impairments Affecting Rehab Potential (+) very motivated, supportive family; (-) chronicity of pain, parkinsons   PT Frequency 2x / week   PT Duration 8 weeks   PT Treatment/Interventions ADLs/Self Care Home Management;Biofeedback;Cryotherapy;Moist Heat;DME Instruction;Gait training;Stair training;Functional mobility training;Therapeutic activities;Therapeutic exercise;Balance training;Neuromuscular re-education;Patient/family education;Manual techniques;Passive range of motion;Dry needling;Energy conservation;Taping   PT Next Visit Plan review  initial eval/goals, HEP; incorporate PWR based exercises but also work on postural training and core strength, LE flexibility. Manual PRN    PT Home Exercise Plan Eval: 3D hip excursions, starfish stretch, HS stretch    Recommended Other Services PWR OT    Consulted and Agree with Plan of Care Patient;Family member/caregiver   Family Member Consulted spouse       Patient will benefit from skilled therapeutic intervention in  order to improve the following deficits and impairments:  Abnormal gait, Improper body mechanics, Pain, Decreased coordination, Decreased mobility, Postural dysfunction, Decreased range of motion, Decreased strength, Hypomobility, Impaired UE functional use, Decreased balance, Difficulty walking, Impaired flexibility  Visit Diagnosis: Chronic bilateral low back pain without sciatica - Plan: PT plan of care cert/re-cert  Pain in right hip - Plan: PT plan of care cert/re-cert  Abnormal posture - Plan: PT plan of care cert/re-cert  Difficulty in walking, not elsewhere classified - Plan: PT plan of care cert/re-cert  Other symptoms and signs involving the musculoskeletal system - Plan: PT plan of care cert/re-cert  Muscle weakness (generalized) - Plan: PT plan of care cert/re-cert      G-Codes - 19/14/78 05/22/1217    Functional Assessment Tool Used (Outpatient Only) Based on skilled clinical assessment of posture, strength, gait, balance, flexibility, mobility    Functional Limitation Mobility: Walking and moving around   Mobility: Walking and Moving Around Current Status ([id number]) At least 40 percent but less than 60 percent impaired, limited or restricted   Mobility: Walking and Moving Around Goal Status [phone]) At least 20 percent but less than 40 percent impaired, limited or restricted       Problem List Patient Active Problem List   Diagnosis Date Noted  . S/P ERCP   . Biliary obstruction   . Pancreatitis, acute   . Choledocholithiasis   . Total bilirubin, elevated   . Cholelithiasis   . Elevated LFTs   . Pancreatitis 09/02/2015  . Parkinson disease ([place])   . Diabetes mellitus without complication ([place])   . Hypertension   . Essential hypertension   . Jaundice   . Chronic low back pain 03/24/2015  . OSA (obstructive sleep apnea) 04/10/2014  . Paralysis agitans ([place]) 10/07/2012    Deniece Ree PT, DPT [facility] [address] [place], [place], [address] Phone: [phone]   Fax:  [phone]  Name: Elijah [name] MRN: [id number] Date of Birth: [birth year]

## 2016-12-07 NOTE — Patient Instructions (Signed)
STARFISH STRETCH   Lay flat on your back on your bed, with your arms and legs stretched out as if you were going to make a snow angel.  Do not use any pillows- instead let your head fall back so the front of your neck is stretched out.  Hold this position for at least 2 minutes.  Repeat at least twice a day.     SEATED HAMSTRING STRETCH  While seated, rest your heel on the floor with your knee straight and gently lean forward until a stretch is felt behind your knee/thigh.  Hold for 30 seconds, then relax and switch legs.  Repeat 3 times each side, twice a day.

## 2016-12-13 ENCOUNTER — Ambulatory Visit (HOSPITAL_COMMUNITY): Payer: PPO

## 2016-12-13 ENCOUNTER — Encounter (HOSPITAL_COMMUNITY): Payer: Self-pay

## 2016-12-13 DIAGNOSIS — M6281 Muscle weakness (generalized): Secondary | ICD-10-CM

## 2016-12-13 DIAGNOSIS — R262 Difficulty in walking, not elsewhere classified: Secondary | ICD-10-CM

## 2016-12-13 DIAGNOSIS — M545 Low back pain, unspecified: Secondary | ICD-10-CM

## 2016-12-13 DIAGNOSIS — M25551 Pain in right hip: Secondary | ICD-10-CM

## 2016-12-13 DIAGNOSIS — R293 Abnormal posture: Secondary | ICD-10-CM

## 2016-12-13 DIAGNOSIS — G8929 Other chronic pain: Secondary | ICD-10-CM

## 2016-12-13 DIAGNOSIS — R29898 Other symptoms and signs involving the musculoskeletal system: Secondary | ICD-10-CM

## 2016-12-13 NOTE — Patient Instructions (Signed)
Hamstring Step 3    Left leg in maximal straight leg raise, heel at maximal stretch, straighten knee further by tightening knee cap. Warning: Intense stretch. Stay within tolerance. Hold 30 seconds. Relax knee cap only. Repeat 3 times twice a day.    Copyright  VHI. All rights reserved.

## 2016-12-13 NOTE — Therapy (Signed)
[place], [place], [address] Phone: [phone]   Fax:  [phone]  Physical Therapy Treatment  Patient Details  Name: Elijah [name] MRN: [id number] Date of Birth: 05/18/1940 Referring Provider: Celene Squibb   Encounter Date: 12/13/2016      PT End of Session - 12/13/16 1038    Visit Number 2   Number of Visits 17   Date for PT Re-Evaluation 01/04/17   Authorization Type [employer]    Authorization Time Period 12/07/16 to 02/06/17   Authorization - Visit Number 2   Authorization - Number of Visits 10   PT Start Time 6948   PT Stop Time 1112   PT Time Calculation (min) 39 min   Activity Tolerance Patient tolerated treatment well;No increased pain   Behavior During Therapy [facility] for tasks assessed/performed      Past Medical History:  Diagnosis Date  . Chronic low back pain 03/24/2015  . Degenerative arthritis   . Diabetes mellitus without complication ([place])   . Dyslipidemia   . Glaucoma   . Gout 10/07/2012  . Hypertension   . Low back pain   . Obesity   . OSA (obstructive sleep apnea) 04/10/2014  . Pancreatitis   . Parkinson disease ([place])   . Rotator cuff tear    Bilateral, no surgery    Past Surgical History:  Procedure Laterality Date  . APPENDECTOMY    . BALLOON DILATION N/A 09/06/2015   Procedure: BALLOON DILATION;  Surgeon: Daneil Dolin, MD;  [facility];  Service: Endoscopy;  Laterality: N/A;  . BILIARY STENT PLACEMENT N/A 09/08/2015   Procedure: BILIARY STENT PLACEMENT;  Surgeon: Daneil Dolin, MD;  [facility];  Service: Endoscopy;  Laterality: N/A;  . ERCP N/A 09/06/2015   Procedure: ENDOSCOPIC RETROGRADE CHOLANGIOPANCREATOGRAPHY (ERCP);  Surgeon: Daneil Dolin, MD;  [facility];  Service: Endoscopy;  Laterality: N/A;  . ERCP N/A 09/08/2015   Procedure: ENDOSCOPIC RETROGRADE CHOLANGIOPANCREATOGRAPHY (ERCP);  Surgeon: Daneil Dolin, MD;   [facility];  Service: Endoscopy;  Laterality: N/A;  with bile duct brushings  . HERNIA REPAIR    . SHOULDER BONE SPUR Left    RESECTION  . SPHINCTEROTOMY N/A 09/06/2015   Procedure: SPHINCTEROTOMY;  Surgeon: Daneil Dolin, MD;  [facility];  Service: Endoscopy;  Laterality: N/A;  . TONSILLECTOMY      There were no vitals filed for this visit.      Subjective Assessment - 12/13/16 1036    Subjective Pt stated he is really sore in hips and lower back following riding a tractor for gardening, current pain scale 4/10.   Patient Stated Goals sit up and walk straight, get back to doing things I'd like to do    Currently in Pain? Yes   Pain Score 4    Pain Location Back   Pain Orientation Lower;Right;Left   Pain Descriptors / Indicators Sore;Aching;Tightness  stiffness   Pain Type Chronic pain   Pain Onset More than a month ago   Pain Frequency Constant   Aggravating Factors  standing   Pain Relieving Factors heat    Effect of Pain on Daily Activities pain with all functional tasks                         [facility] Adult PT Treatment/Exercise - 12/13/16 0001      Bed Mobility  Bed Mobility Sit to Sidelying Right;Sit to Sidelying Left   Sit to Sidelying Right 5: Supervision   Sit to Sidelying Right Details (indicate cue type and reason) cueing for mechanics   Sit to Sidelying Left 5: Supervision   Sit to Sidelying Left Details (indicate cue type and reason) cueing for mechanics     Exercises   Exercises Lumbar     Lumbar Exercises: Stretches   Active Hamstring Stretch 3 reps;30 seconds   Active Hamstring Stretch Limitations supine      Lumbar Exercises: Standing   Other Standing Lumbar Exercises 3D hip excursion 10x     Lumbar Exercises: Seated   Other Seated Lumbar Exercises educated sitting posture, instructed lumbar roll   Other Seated Lumbar Exercises sitting posture with UE flexion 10x (tendency to lean to Rt without HHA)      Lumbar Exercises: Supine   Other Supine Lumbar Exercises stargazer stretch x 2 min   Other Supine Lumbar Exercises bed mobility 3 reps                PT Education - 12/13/16 1051    Education provided Yes   Education Details Reviewed goals, assured compliance with HEP, copy of eval given to pt.  Instructed proper bed mechanics and lumbar roll while sitting.   Person(s) Educated Patient   Methods Explanation;Demonstration;Handout   Comprehension Verbalized understanding;Returned demonstration;Verbal cues required;Need further instruction          PT Short Term Goals - 12/07/16 1213      PT SHORT TERM GOAL #1   Title Patient to experience pain as being no more than 2/10 at worst in order to improve QOL and improve functional task performance    Time 4   Period Weeks   Status New   Target Date 01/04/17     PT SHORT TERM GOAL #2   Title Patient to be able to [facility] correct posture at least 50% of the time without external cues, patient and spouse also to be able to demonstrate placement of pads/pillows to assist with postural correction with cues no more than 20% of the time    Time 4   Period Weeks   Status New     PT SHORT TERM GOAL #3   Title Patient to be independent with correct supine to sit/sit to supine mechanics in order to reduce stress on lumbar spine with functional mobility    Time 4   Period Weeks   Status New     PT SHORT TERM GOAL #4   Title Patient to be compliant with correct performance of HEP, to be updated as appropriate    Time 1   Period Weeks   Status New   Target Date 12/14/16           PT Long Term Goals - 12/07/16 1216      PT LONG TERM GOAL #1   Title Patient to demonstrate an improvement of at least 1 MMT grade in all tested groups in order to improve gait pattern  and balance   Time 8   Period Weeks   Status New   Target Date 02/01/17     PT LONG TERM GOAL #2   Title Patient to be able to complete TUG test in 14 seconds or  less with LRAD in order to show improved mobility and balance    Time 8   Period Weeks   Status New     PT LONG TERM GOAL #3  Title Patient to demonstrate at least a 50% improvement in muscle flexibility deficits in order to reduce pain and improve mechanics    Time 8   Period Weeks   Status New     PT LONG TERM GOAL #4   Title Patient and spouse to be able to verbally explain the benefits of PWR/HIIT training for Parkinsons disease, and will be participatory in PWR based exercise program to assist in maintaining flexibility, trunk/hip mobility, and prevent worsening of back pain due to Parkinsonian changes    Time 8   Period Weeks   Status New               Plan - 12/13/16 1246    Clinical Impression Statement Reviewed goals, assured compliance and proper form/technique with HEP and copy of eval given to pt.  Reviewed proper bed mechanics with moderate verbal cueing for proper technqiue.  Therex focus on core strengthening and LE mobility, incorporated PWR sitting balance/core strength as well.  Pt with increased difficulty sitting without HHA, tendency to lean to the Rt.  Pt educated on benefits of lumbar roll for pain control and to improve seat3ed posture.     Rehab Potential Good   Clinical Impairments Affecting Rehab Potential (+) very motivated, supportive family; (-) chronicity of pain, parkinsons   PT Frequency 2x / week   PT Duration 8 weeks   PT Treatment/Interventions ADLs/Self Care Home Management;Biofeedback;Cryotherapy;Moist Heat;DME Instruction;Gait training;Stair training;Functional mobility training;Therapeutic activities;Therapeutic exercise;Balance training;Neuromuscular re-education;Patient/family education;Manual techniques;Passive range of motion;Dry needling;Energy conservation;Taping   PT Next Visit Plan Assure compliance with HEP for form/technqiue, f/u with use of lumbar roll.  Progress core strengthening in sitting, incorporate PWR based exercises but  also work on postural training and core strength, LE flexibility. Manual PRN    PT Home Exercise Plan Eval: 3D hip excursions, starfish stretch, HS stretch       Patient will benefit from skilled therapeutic intervention in order to improve the following deficits and impairments:  Abnormal gait, Improper body mechanics, Pain, Decreased coordination, Decreased mobility, Postural dysfunction, Decreased range of motion, Decreased strength, Hypomobility, Impaired UE functional use, Decreased balance, Difficulty walking, Impaired flexibility  Visit Diagnosis: Chronic bilateral low back pain without sciatica  Pain in right hip  Abnormal posture  Difficulty in walking, not elsewhere classified  Other symptoms and signs involving the musculoskeletal system  Muscle weakness (generalized)     Problem List Patient Active Problem List   Diagnosis Date Noted  . S/P ERCP   . Biliary obstruction   . Pancreatitis, acute   . Choledocholithiasis   . Total bilirubin, elevated   . Cholelithiasis   . Elevated LFTs   . Pancreatitis 09/02/2015  . Parkinson disease ([place])   . Diabetes mellitus without complication ([place])   . Hypertension   . Essential hypertension   . Jaundice   . Chronic low back pain 03/24/2015  . OSA (obstructive sleep apnea) 04/10/2014  . Paralysis agitans [facility]) 10/07/2012   Ihor Austin, [place]; [place]  Aldona Lento 12/13/2016, 12:51 PM  [facility] [address] [place], [place], [address] Phone: [phone]   Fax:  [phone]  Name: SIVAN QUAST MRN: [id number] Date of Birth: [birth year]

## 2016-12-14 ENCOUNTER — Ambulatory Visit (HOSPITAL_COMMUNITY): Payer: PPO

## 2016-12-14 ENCOUNTER — Encounter (HOSPITAL_COMMUNITY): Payer: Self-pay

## 2016-12-14 DIAGNOSIS — M545 Low back pain, unspecified: Secondary | ICD-10-CM

## 2016-12-14 DIAGNOSIS — G8929 Other chronic pain: Secondary | ICD-10-CM

## 2016-12-14 DIAGNOSIS — M25551 Pain in right hip: Secondary | ICD-10-CM

## 2016-12-14 DIAGNOSIS — R293 Abnormal posture: Secondary | ICD-10-CM

## 2016-12-14 DIAGNOSIS — R262 Difficulty in walking, not elsewhere classified: Secondary | ICD-10-CM

## 2016-12-14 DIAGNOSIS — R29898 Other symptoms and signs involving the musculoskeletal system: Secondary | ICD-10-CM

## 2016-12-14 DIAGNOSIS — M6281 Muscle weakness (generalized): Secondary | ICD-10-CM

## 2016-12-14 NOTE — Therapy (Signed)
[place], [place], [address] Phone: [phone]   Fax:  [phone]  Physical Therapy Treatment  Patient Details  Name: Elijah [name] MRN: [id number] Date of Birth: August 01, 1940 Referring Provider: Celene Squibb   Encounter Date: 12/14/2016      PT End of Session - 12/14/16 1118    Visit Number 3   Number of Visits 17   Date for PT Re-Evaluation 01/04/17   Authorization Type [employer]    Authorization Time Period 12/07/16 to 02/06/17   Authorization - Visit Number 3   Authorization - Number of Visits 10   PT Start Time 7353   PT Stop Time 1115   PT Time Calculation (min) 43 min   Activity Tolerance Patient tolerated treatment well;No increased pain   Behavior During Therapy WFL for tasks assessed/performed      Past Medical History:  Diagnosis Date  . Chronic low back pain 03/24/2015  . Degenerative arthritis   . Diabetes mellitus without complication ([place])   . Dyslipidemia   . Glaucoma   . Gout 10/07/2012  . Hypertension   . Low back pain   . Obesity   . OSA (obstructive sleep apnea) 04/10/2014  . Pancreatitis   . Parkinson disease ([place])   . Rotator cuff tear    Bilateral, no surgery    Past Surgical History:  Procedure Laterality Date  . APPENDECTOMY    . BALLOON DILATION N/A 09/06/2015   Procedure: BALLOON DILATION;  Surgeon: Daneil Dolin, MD;  [facility];  Service: Endoscopy;  Laterality: N/A;  . BILIARY STENT PLACEMENT N/A 09/08/2015   Procedure: BILIARY STENT PLACEMENT;  Surgeon: Daneil Dolin, MD;  [facility];  Service: Endoscopy;  Laterality: N/A;  . ERCP N/A 09/06/2015   Procedure: ENDOSCOPIC RETROGRADE CHOLANGIOPANCREATOGRAPHY (ERCP);  Surgeon: Daneil Dolin, MD;  [facility];  Service: Endoscopy;  Laterality: N/A;  . ERCP N/A 09/08/2015   Procedure: ENDOSCOPIC RETROGRADE CHOLANGIOPANCREATOGRAPHY (ERCP);  Surgeon: Daneil Dolin, MD;   [facility];  Service: Endoscopy;  Laterality: N/A;  with bile duct brushings  . HERNIA REPAIR    . SHOULDER BONE SPUR Left    RESECTION  . SPHINCTEROTOMY N/A 09/06/2015   Procedure: SPHINCTEROTOMY;  Surgeon: Daneil Dolin, MD;  [facility];  Service: Endoscopy;  Laterality: N/A;  . TONSILLECTOMY      There were no vitals filed for this visit.      Subjective Assessment - 12/14/16 1040    Subjective Pt notes that he feels rough today, Has some tightness in his low back and hips. He reports feeling better after yesterdays session. Denies pain just tight    Currently in Pain? No/denies                         [facility] Adult PT Treatment/Exercise - 12/14/16 0001      Neck Exercises: Seated   X to V 10 reps   W Back 10 reps   Shoulder Flexion 10 reps   Other Seated Exercise 3D thoracic excursion x 10 each     Lumbar Exercises: Stretches   Active Hamstring Stretch 3 reps;30 seconds   Active Hamstring Stretch Limitations supine    Single Knee to Chest Stretch 10 seconds   Lower Trunk Rotation 10 seconds     Lumbar Exercises: Standing   Other Standing Lumbar Exercises 3D hip  excursion 10x     Lumbar Exercises: Seated   Long Arc Quad on Chair Both;10 reps   Other Seated Lumbar Exercises Chair hip flexion x 10 each   Other Seated Lumbar Exercises sitting posture with UE flexion 10x (tendency to lean to Rt without HHA)     Lumbar Exercises: Supine   Glut Set 10 reps   Other Supine Lumbar Exercises stargazer stretch x 2 min                PT Education - 12/13/16 1051    Education provided Yes   Education Details Reviewed goals, assured compliance with HEP, copy of eval given to pt.  Instructed proper bed mechanics and lumbar roll while sitting.   Person(s) Educated Patient   Methods Explanation;Demonstration;Handout   Comprehension Verbalized understanding;Returned demonstration;Verbal cues required;Need further instruction           PT Short Term Goals - 12/07/16 1213      PT SHORT TERM GOAL #1   Title Patient to experience pain as being no more than 2/10 at worst in order to improve QOL and improve functional task performance    Time 4   Period Weeks   Status New   Target Date 01/04/17     PT SHORT TERM GOAL #2   Title Patient to be able to [facility] correct posture at least 50% of the time without external cues, patient and spouse also to be able to demonstrate placement of pads/pillows to assist with postural correction with cues no more than 20% of the time    Time 4   Period Weeks   Status New     PT SHORT TERM GOAL #3   Title Patient to be independent with correct supine to sit/sit to supine mechanics in order to reduce stress on lumbar spine with functional mobility    Time 4   Period Weeks   Status New     PT SHORT TERM GOAL #4   Title Patient to be compliant with correct performance of HEP, to be updated as appropriate    Time 1   Period Weeks   Status New   Target Date 12/14/16           PT Long Term Goals - 12/07/16 1216      PT LONG TERM GOAL #1   Title Patient to demonstrate an improvement of at least 1 MMT grade in all tested groups in order to improve gait pattern  and balance   Time 8   Period Weeks   Status New   Target Date 02/01/17     PT LONG TERM GOAL #2   Title Patient to be able to complete TUG test in 14 seconds or less with LRAD in order to show improved mobility and balance    Time 8   Period Weeks   Status New     PT LONG TERM GOAL #3   Title Patient to demonstrate at least a 50% improvement in muscle flexibility deficits in order to reduce pain and improve mechanics    Time 8   Period Weeks   Status New     PT LONG TERM GOAL #4   Title Patient and spouse to be able to verbally explain the benefits of PWR/HIIT training for Parkinsons disease, and will be participatory in PWR based exercise program to assist in maintaining flexibility, trunk/hip mobility,  and prevent worsening of back pain due to Parkinsonian changes    Time 8  Period Weeks   Status New               Plan - 12/14/16 1118    Clinical Impression Statement Reviewed bed mobility and proper form/technique which patient demonstrated increased reliance on verbal cueing to improve mechancis for lumbar spine. Tolerated addition of supine stretching and seated postural exercises well without increae in symptoms. Continues to rely on verbal cueing for posture in midline during therex with tendency to lean to the Rt. Patient noted continued tightness end of session "across hip bones" but overall did well tdoay.    Rehab Potential Good   Clinical Impairments Affecting Rehab Potential (+) very motivated, supportive family; (-) chronicity of pain, parkinsons   PT Frequency 2x / week   PT Duration 8 weeks   PT Treatment/Interventions ADLs/Self Care Home Management;Biofeedback;Cryotherapy;Moist Heat;DME Instruction;Gait training;Stair training;Functional mobility training;Therapeutic activities;Therapeutic exercise;Balance training;Neuromuscular re-education;Patient/family education;Manual techniques;Passive range of motion;Dry needling;Energy conservation;Taping   PT Next Visit Plan Progress core strengthening in sitting, incorporate PWR based exercises but also work on postural training and core strength, LE flexibility. Manual PRN    PT Home Exercise Plan Eval: 3D hip excursions, starfish stretch, HS stretch       Patient will benefit from skilled therapeutic intervention in order to improve the following deficits and impairments:  Abnormal gait, Improper body mechanics, Pain, Decreased coordination, Decreased mobility, Postural dysfunction, Decreased range of motion, Decreased strength, Hypomobility, Impaired UE functional use, Decreased balance, Difficulty walking, Impaired flexibility  Visit Diagnosis: Chronic bilateral low back pain without sciatica  Pain in right  hip  Abnormal posture  Difficulty in walking, not elsewhere classified  Other symptoms and signs involving the musculoskeletal system  Muscle weakness (generalized)     Problem List Patient Active Problem List   Diagnosis Date Noted  . S/P ERCP   . Biliary obstruction   . Pancreatitis, acute   . Choledocholithiasis   . Total bilirubin, elevated   . Cholelithiasis   . Elevated LFTs   . Pancreatitis 09/02/2015  . Parkinson disease ([place])   . Diabetes mellitus without complication ([place])   . Hypertension   . Essential hypertension   . Jaundice   . Chronic low back pain 03/24/2015  . OSA (obstructive sleep apnea) 04/10/2014  . Paralysis agitans ([place]) 10/07/2012   Starr Lake PT, DPT 11:21 AM, 12/14/16 [place], [place], [address] Phone: [phone]   Fax:  [phone]  Name: ANTOIN DARGIS MRN: [id number] Date of Birth: [birth year]

## 2016-12-19 ENCOUNTER — Ambulatory Visit (HOSPITAL_COMMUNITY): Payer: PPO | Admitting: Physical Therapy

## 2016-12-19 ENCOUNTER — Encounter (HOSPITAL_COMMUNITY): Payer: Self-pay | Admitting: Physical Therapy

## 2016-12-19 DIAGNOSIS — R262 Difficulty in walking, not elsewhere classified: Secondary | ICD-10-CM

## 2016-12-19 DIAGNOSIS — G8929 Other chronic pain: Secondary | ICD-10-CM

## 2016-12-19 DIAGNOSIS — R293 Abnormal posture: Secondary | ICD-10-CM

## 2016-12-19 DIAGNOSIS — M545 Low back pain: Secondary | ICD-10-CM | POA: Diagnosis not present

## 2016-12-19 DIAGNOSIS — M6281 Muscle weakness (generalized): Secondary | ICD-10-CM

## 2016-12-19 DIAGNOSIS — M25551 Pain in right hip: Secondary | ICD-10-CM

## 2016-12-19 DIAGNOSIS — R29898 Other symptoms and signs involving the musculoskeletal system: Secondary | ICD-10-CM

## 2016-12-19 NOTE — Therapy (Signed)
[place], [place], [address] Phone: [phone]   Fax:  [phone]  Physical Therapy Treatment  Patient Details  Name: Elijah [name] MRN: [id number] Date of Birth: 07/26/1940 Referring Provider: Celene Squibb   Encounter Date: 12/19/2016      PT End of Session - 12/19/16 1201    Visit Number 4   Number of Visits 17   Date for PT Re-Evaluation 01/04/17   Authorization Type [employer]    Authorization Time Period 12/07/16 to 02/06/17   Authorization - Visit Number 4   Authorization - Number of Visits 10   PT Start Time 1120   PT Stop Time 1159   PT Time Calculation (min) 39 min   Activity Tolerance Patient tolerated treatment well   Behavior During Therapy [facility] for tasks assessed/performed      Past Medical History:  Diagnosis Date  . Chronic low back pain 03/24/2015  . Degenerative arthritis   . Diabetes mellitus without complication ([place])   . Dyslipidemia   . Glaucoma   . Gout 10/07/2012  . Hypertension   . Low back pain   . Obesity   . OSA (obstructive sleep apnea) 04/10/2014  . Pancreatitis   . Parkinson disease ([place])   . Rotator cuff tear    Bilateral, no surgery    Past Surgical History:  Procedure Laterality Date  . APPENDECTOMY    . BALLOON DILATION N/A 09/06/2015   Procedure: BALLOON DILATION;  Surgeon: Daneil Dolin, MD;  [facility];  Service: Endoscopy;  Laterality: N/A;  . BILIARY STENT PLACEMENT N/A 09/08/2015   Procedure: BILIARY STENT PLACEMENT;  Surgeon: Daneil Dolin, MD;  [facility];  Service: Endoscopy;  Laterality: N/A;  . ERCP N/A 09/06/2015   Procedure: ENDOSCOPIC RETROGRADE CHOLANGIOPANCREATOGRAPHY (ERCP);  Surgeon: Daneil Dolin, MD;  [facility];  Service: Endoscopy;  Laterality: N/A;  . ERCP N/A 09/08/2015   Procedure: ENDOSCOPIC RETROGRADE CHOLANGIOPANCREATOGRAPHY (ERCP);  Surgeon: Daneil Dolin, MD;  [facility];  Service: Endoscopy;  Laterality: N/A;  with bile duct brushings  . HERNIA REPAIR    . SHOULDER BONE SPUR Left    RESECTION  . SPHINCTEROTOMY N/A 09/06/2015   Procedure: SPHINCTEROTOMY;  Surgeon: Daneil Dolin, MD;  [facility];  Service: Endoscopy;  Laterality: N/A;  . TONSILLECTOMY      There were no vitals filed for this visit.      Subjective Assessment - 12/19/16 1123    Subjective Patient arrives stating he is having some pain today, this has happened in the past and it is very sore right now. He feels some of the stretches have been helping.    Patient Stated Goals sit up and walk straight, get back to doing things I'd like to do    Currently in Pain? Yes   Pain Score 6    Pain Location Back   Pain Orientation Right;Left   Pain Descriptors / Indicators Tightness;Sharp   Pain Type Chronic pain   Pain Radiating Towards none    Pain Onset More than a month ago   Pain Frequency Constant   Aggravating Factors  unsure    Pain Relieving Factors heat    Effect of Pain on Daily Activities moderate                          [facility] Adult PT  Treatment/Exercise - 12/19/16 0001      Lumbar Exercises: Stretches   Active Hamstring Stretch 3 reps;30 seconds   Active Hamstring Stretch Limitations supine    Single Knee to Chest Stretch 5 reps;10 seconds   Single Knee to Chest Stretch Limitations B    Lower Trunk Rotation 5 reps;10 seconds   Lower Trunk Rotation Limitations B    Piriformis Stretch 2 reps;30 seconds   Piriformis Stretch Limitations supine      Lumbar Exercises: Seated   Other Seated Lumbar Exercises seated modified PWR exercises 1x10 for lumbar ROM (PWR up and PWR twist) and posture      Manual Therapy   Manual Therapy Soft tissue mobilization   Manual therapy comments separate from all otehr skilled services    Soft tissue mobilization L lateral quad and ITB/TFL ball assisted                 PT Education - 12/19/16 1201     Education provided Yes   Education Details HEP updates    Person(s) Educated Patient   Methods Explanation;Handout   Comprehension Verbalized understanding;Returned demonstration          PT Short Term Goals - 12/07/16 1213      PT SHORT TERM GOAL #1   Title Patient to experience pain as being no more than 2/10 at worst in order to improve QOL and improve functional task performance    Time 4   Period Weeks   Status New   Target Date 01/04/17     PT SHORT TERM GOAL #2   Title Patient to be able to [facility] correct posture at least 50% of the time without external cues, patient and spouse also to be able to demonstrate placement of pads/pillows to assist with postural correction with cues no more than 20% of the time    Time 4   Period Weeks   Status New     PT SHORT TERM GOAL #3   Title Patient to be independent with correct supine to sit/sit to supine mechanics in order to reduce stress on lumbar spine with functional mobility    Time 4   Period Weeks   Status New     PT SHORT TERM GOAL #4   Title Patient to be compliant with correct performance of HEP, to be updated as appropriate    Time 1   Period Weeks   Status New   Target Date 12/14/16           PT Long Term Goals - 12/07/16 1216      PT LONG TERM GOAL #1   Title Patient to demonstrate an improvement of at least 1 MMT grade in all tested groups in order to improve gait pattern  and balance   Time 8   Period Weeks   Status New   Target Date 02/01/17     PT LONG TERM GOAL #2   Title Patient to be able to complete TUG test in 14 seconds or less with LRAD in order to show improved mobility and balance    Time 8   Period Weeks   Status New     PT LONG TERM GOAL #3   Title Patient to demonstrate at least a 50% improvement in muscle flexibility deficits in order to reduce pain and improve mechanics    Time 8   Period Weeks   Status New     PT LONG TERM GOAL #4   Title Patient  and spouse to be able  to verbally explain the benefits of PWR/HIIT training for Parkinsons disease, and will be participatory in PWR based exercise program to assist in maintaining flexibility, trunk/hip mobility, and prevent worsening of back pain due to Parkinsonian changes    Time 8   Period [place] - 12/19/16 1202    Clinical Impression Statement Patient arrives today reporting he is hurting quite a bit due to spasm in his low back; continued with lumbar and hip stretches and mobility tasks due to patient reported relief with these over the past couple of sessions. Also introduced modified seated PWR exercises for trunk mobility and further core activity. Pain reduced to 0/10 at EOS.    Rehab Potential Good   Clinical Impairments Affecting Rehab Potential (+) very motivated, supportive family; (-) chronicity of pain, parkinsons   PT Frequency 2x / week   PT Duration 8 weeks   PT Treatment/Interventions ADLs/Self Care Home Management;Biofeedback;Cryotherapy;Moist Heat;DME Instruction;Gait training;Stair training;Functional mobility training;Therapeutic activities;Therapeutic exercise;Balance training;Neuromuscular re-education;Patient/family education;Manual techniques;Passive range of motion;Dry needling;Energy conservation;Taping   PT Next Visit Plan continue working on lubmar and hip flexibility/mobility, continue manual to L LE and lumbar paraspinals as appropriate; continue modified seated PWR exercises for lumbar ROM/core activation    PT Home Exercise Plan Eval: 3D hip excursions, starfish stretch, HS stretch 10/30: SKTC, lumbar rotation    Consulted and Agree with Plan of Care Patient;Family member/caregiver   Family Member Consulted spouse       Patient will benefit from skilled therapeutic intervention in order to improve the following deficits and impairments:  Abnormal gait, Improper body mechanics, Pain, Decreased coordination, Decreased mobility, Postural  dysfunction, Decreased range of motion, Decreased strength, Hypomobility, Impaired UE functional use, Decreased balance, Difficulty walking, Impaired flexibility  Visit Diagnosis: Chronic bilateral low back pain without sciatica  Pain in right hip  Abnormal posture  Difficulty in walking, not elsewhere classified  Other symptoms and signs involving the musculoskeletal system  Muscle weakness (generalized)     Problem List Patient Active Problem List   Diagnosis Date Noted  . S/P ERCP   . Biliary obstruction   . Pancreatitis, acute   . Choledocholithiasis   . Total bilirubin, elevated   . Cholelithiasis   . Elevated LFTs   . Pancreatitis 09/02/2015  . Parkinson disease ([place])   . Diabetes mellitus without complication ([place])   . Hypertension   . Essential hypertension   . Jaundice   . Chronic low back pain 03/24/2015  . OSA (obstructive sleep apnea) 04/10/2014  . Paralysis agitans ([place]) 10/07/2012    Deniece Ree PT, DPT [facility] [address] [place], [place], [address] Phone: [phone]   Fax:  [phone]  Name: CEDRIC MCCLAINE MRN: [id number] Date of Birth: 1940-08-23

## 2016-12-19 NOTE — Patient Instructions (Signed)
   SINGLE KNEE TO CHEST STRETCH - SKTC  While lying on your back, use your hands and gently draw up a knee towards your chest.   Keep your other knee straight and lying on the ground.  Hold for a slow count of 10.  Repeat 5 times each side, 2-3 times per day.    Lumbar Rotations   Lying on your back with your knees bent, slowly drop your legs to one side and hold the stretch. Come back to the middle and switch sides. You should feel the stretch in your back on the opposite side that your legs are leaning.   Hold for a slow count of 10.  Repeat 5 times each side, 2-3 times per day.

## 2016-12-21 ENCOUNTER — Ambulatory Visit (HOSPITAL_COMMUNITY): Payer: PPO | Attending: Internal Medicine

## 2016-12-21 DIAGNOSIS — G8929 Other chronic pain: Secondary | ICD-10-CM

## 2016-12-21 DIAGNOSIS — M6281 Muscle weakness (generalized): Secondary | ICD-10-CM | POA: Diagnosis not present

## 2016-12-21 DIAGNOSIS — M545 Low back pain, unspecified: Secondary | ICD-10-CM

## 2016-12-21 DIAGNOSIS — R262 Difficulty in walking, not elsewhere classified: Secondary | ICD-10-CM

## 2016-12-21 DIAGNOSIS — R293 Abnormal posture: Secondary | ICD-10-CM

## 2016-12-21 DIAGNOSIS — R29898 Other symptoms and signs involving the musculoskeletal system: Secondary | ICD-10-CM

## 2016-12-21 DIAGNOSIS — M25551 Pain in right hip: Secondary | ICD-10-CM | POA: Diagnosis not present

## 2016-12-21 NOTE — Therapy (Signed)
[place], [place], [address] Phone: [phone]   Fax:  [phone]  Physical Therapy Treatment  Patient Details  Name: Elijah [name] MRN: [id number] Date of Birth: Aug 09, 1940 Referring Provider: Celene Squibb   Encounter Date: 12/21/2016      PT End of Session - 12/21/16 1122    Visit Number 5   Number of Visits 17   Date for PT Re-Evaluation 01/04/17   Authorization Type [employer]    Authorization Time Period 12/07/16 to 02/06/17   Authorization - Visit Number 5   Authorization - Number of Visits 10   PT Start Time 1030   PT Stop Time 1115   PT Time Calculation (min) 45 min   Activity Tolerance Patient tolerated treatment well   Behavior During Therapy [facility] for tasks assessed/performed      Past Medical History:  Diagnosis Date  . Chronic low back pain 03/24/2015  . Degenerative arthritis   . Diabetes mellitus without complication ([place])   . Dyslipidemia   . Glaucoma   . Gout 10/07/2012  . Hypertension   . Low back pain   . Obesity   . OSA (obstructive sleep apnea) 04/10/2014  . Pancreatitis   . Parkinson disease ([place])   . Rotator cuff tear    Bilateral, no surgery    Past Surgical History:  Procedure Laterality Date  . APPENDECTOMY    . BALLOON DILATION N/A 09/06/2015   Procedure: BALLOON DILATION;  Surgeon: Daneil Dolin, MD;  [facility];  Service: Endoscopy;  Laterality: N/A;  . BILIARY STENT PLACEMENT N/A 09/08/2015   Procedure: BILIARY STENT PLACEMENT;  Surgeon: Daneil Dolin, MD;  [facility];  Service: Endoscopy;  Laterality: N/A;  . ERCP N/A 09/06/2015   Procedure: ENDOSCOPIC RETROGRADE CHOLANGIOPANCREATOGRAPHY (ERCP);  Surgeon: Daneil Dolin, MD;  [facility];  Service: Endoscopy;  Laterality: N/A;  . ERCP N/A 09/08/2015   Procedure: ENDOSCOPIC RETROGRADE CHOLANGIOPANCREATOGRAPHY (ERCP);  Surgeon: Daneil Dolin, MD;  [facility];  Service: Endoscopy;  Laterality: N/A;  with bile duct brushings  . HERNIA REPAIR    . SHOULDER BONE SPUR Left    RESECTION  . SPHINCTEROTOMY N/A 09/06/2015   Procedure: SPHINCTEROTOMY;  Surgeon: Daneil Dolin, MD;  [facility];  Service: Endoscopy;  Laterality: N/A;  . TONSILLECTOMY      There were no vitals filed for this visit.      Subjective Assessment - 12/21/16 1036    Subjective Patient notes that he woke up yesterday with very minimal pain, more a soreness and after he completed some gentle rocking of his legs ( lower trunk rotation) the pain decreased. He denies any pain today.  He reports soft tissue assisted with ball during manual helped the most last session.    Currently in Pain? No/denies                         [facility] Adult PT Treatment/Exercise - 12/21/16 0001      Lumbar Exercises: Stretches   Active Hamstring Stretch 3 reps;30 seconds   Active Hamstring Stretch Limitations supine    Single Knee to Chest Stretch 5 reps;10 seconds   Single Knee to Chest Stretch Limitations B    Lower Trunk Rotation 5 reps;10 seconds   Lower Trunk Rotation Limitations B      Lumbar Exercises: Seated   Other  Seated Lumbar Exercises seated modified PWR exercises 1x10 for lumbar ROM (PWR up and PWR twist) and posture    Other Seated Lumbar Exercises Seated thoracic extension with foam x 10, UE scaption "Y"                  PT Short Term Goals - 12/07/16 1213      PT SHORT TERM GOAL #1   Title Patient to experience pain as being no more than 2/10 at worst in order to improve QOL and improve functional task performance    Time 4   Period Weeks   Status New   Target Date 01/04/17     PT SHORT TERM GOAL #2   Title Patient to be able to [facility] correct posture at least 50% of the time without external cues, patient and spouse also to be able to demonstrate placement of pads/pillows to assist with postural correction with cues no more  than 20% of the time    Time 4   Period Weeks   Status New     PT SHORT TERM GOAL #3   Title Patient to be independent with correct supine to sit/sit to supine mechanics in order to reduce stress on lumbar spine with functional mobility    Time 4   Period Weeks   Status New     PT SHORT TERM GOAL #4   Title Patient to be compliant with correct performance of HEP, to be updated as appropriate    Time 1   Period Weeks   Status New   Target Date 12/14/16           PT Long Term Goals - 12/07/16 1216      PT LONG TERM GOAL #1   Title Patient to demonstrate an improvement of at least 1 MMT grade in all tested groups in order to improve gait pattern  and balance   Time 8   Period Weeks   Status New   Target Date 02/01/17     PT LONG TERM GOAL #2   Title Patient to be able to complete TUG test in 14 seconds or less with LRAD in order to show improved mobility and balance    Time 8   Period Weeks   Status New     PT LONG TERM GOAL #3   Title Patient to demonstrate at least a 50% improvement in muscle flexibility deficits in order to reduce pain and improve mechanics    Time 8   Period Weeks   Status New     PT LONG TERM GOAL #4   Title Patient and spouse to be able to verbally explain the benefits of PWR/HIIT training for Parkinsons disease, and will be participatory in PWR based exercise program to assist in maintaining flexibility, trunk/hip mobility, and prevent worsening of back pain due to Parkinsonian changes    Time 8   Period Weeks   Status New               Plan - 12/21/16 1123    Clinical Impression Statement Patient reported that he has seen most improvement with manual completed last session, therefore, continued focuse this session to improve tissue tension. Pt tolerated addition of thoracic mobility in chair over foam roll with PWR scapular retraction. Patient presented with no increase in symptoms EOS.    Rehab Potential Good   Clinical Impairments  Affecting Rehab Potential (+) very motivated, supportive family; (-) chronicity of pain, parkinsons  PT Frequency 2x / week   PT Duration 8 weeks   PT Treatment/Interventions ADLs/Self Care Home Management;Biofeedback;Cryotherapy;Moist Heat;DME Instruction;Gait training;Stair training;Functional mobility training;Therapeutic activities;Therapeutic exercise;Balance training;Neuromuscular re-education;Patient/family education;Manual techniques;Passive range of motion;Dry needling;Energy conservation;Taping   PT Next Visit Plan continue working on lubmar and hip flexibility/mobility, continue manual to L LE and lumbar paraspinals as appropriate; continue modified seated PWR exercises for lumbar ROM/core activation    PT Home Exercise Plan Eval: 3D hip excursions, starfish stretch, HS stretch 10/30: SKTC, lumbar rotation    Consulted and Agree with Plan of Care Patient;Family member/caregiver   Family Member Consulted spouse       Patient will benefit from skilled therapeutic intervention in order to improve the following deficits and impairments:  Abnormal gait, Improper body mechanics, Pain, Decreased coordination, Decreased mobility, Postural dysfunction, Decreased range of motion, Decreased strength, Hypomobility, Impaired UE functional use, Decreased balance, Difficulty walking, Impaired flexibility  Visit Diagnosis: Chronic bilateral low back pain without sciatica  Pain in right hip  Abnormal posture  Difficulty in walking, not elsewhere classified  Other symptoms and signs involving the musculoskeletal system  Muscle weakness (generalized)     Problem List Patient Active Problem List   Diagnosis Date Noted  . S/P ERCP   . Biliary obstruction   . Pancreatitis, acute   . Choledocholithiasis   . Total bilirubin, elevated   . Cholelithiasis   . Elevated LFTs   . Pancreatitis 09/02/2015  . Parkinson disease ([place])   . Diabetes mellitus without complication ([place])   .  Hypertension   . Essential hypertension   . Jaundice   . Chronic low back pain 03/24/2015  . OSA (obstructive sleep apnea) 04/10/2014  . Paralysis agitans ([place]) 10/07/2012   Starr Lake PT, DPT 11:27 AM, 12/21/16 [place], [place], [address] Phone: [phone]   Fax:  [phone]  Name: Elijah [name] MRN: [id number] Date of Birth: 06/04/1940

## 2016-12-26 ENCOUNTER — Encounter (HOSPITAL_COMMUNITY): Payer: Self-pay

## 2016-12-26 ENCOUNTER — Ambulatory Visit (HOSPITAL_COMMUNITY): Payer: PPO

## 2016-12-26 DIAGNOSIS — M25551 Pain in right hip: Secondary | ICD-10-CM

## 2016-12-26 DIAGNOSIS — G8929 Other chronic pain: Secondary | ICD-10-CM

## 2016-12-26 DIAGNOSIS — M545 Low back pain, unspecified: Secondary | ICD-10-CM

## 2016-12-26 NOTE — Therapy (Signed)
[place], [place], [address] Phone: [phone]   Fax:  [phone]  Physical Therapy Treatment  Patient Details  Name: Elijah [name] MRN: [id number] Date of Birth: April 17, 1940 Referring Provider: Celene Squibb    Encounter Date: 12/26/2016  PT End of Session - 12/26/16 1047    Visit Number  6    Number of Visits  17    Date for PT Re-Evaluation  01/04/17    Authorization Type  [employer]     Authorization Time Period  12/07/16 to 02/06/17    Authorization - Visit Number  6    Authorization - Number of Visits  10    PT Start Time  6010    PT Stop Time  9323    PT Time Calculation (min)  42 min    Activity Tolerance  Patient tolerated treatment well;No increased pain    Behavior During Therapy  WFL for tasks assessed/performed       Past Medical History:  Diagnosis Date  . Chronic low back pain 03/24/2015  . Degenerative arthritis   . Diabetes mellitus without complication ([place])   . Dyslipidemia   . Glaucoma   . Gout 10/07/2012  . Hypertension   . Low back pain   . Obesity   . OSA (obstructive sleep apnea) 04/10/2014  . Pancreatitis   . Parkinson disease ([place])   . Rotator cuff tear    Bilateral, no surgery    Past Surgical History:  Procedure Laterality Date  . APPENDECTOMY    . HERNIA REPAIR    . SHOULDER BONE SPUR Left    RESECTION  . TONSILLECTOMY      There were no vitals filed for this visit.  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      Patient Stated Goals  sit up and walk straight, get back to doing things I'd like to do     Currently in Pain?  Yes    Pain Score  2     Pain Location  Back    Pain Orientation  Lower    Pain Descriptors / Indicators  Sore    Pain Type  Chronic pain    Pain Onset  More than a month ago    Pain Frequency   Constant    Aggravating Factors   unsure    Pain Relieving Factors  heat    Effect of Pain on Daily Activities  moderate                      [facility] Adult PT Treatment/Exercise - 12/26/16 0001      Lumbar Exercises: Stretches   Active Hamstring Stretch  2 reps;20 seconds    Active Hamstring Stretch Limitations  supine     Single Knee to Chest Stretch  3 reps;20 seconds    Single Knee to Chest Stretch Limitations  B     Lower Trunk Rotation  5 reps;10 seconds    Lower Trunk Rotation Limitations  B       Lumbar Exercises: Seated   Other Seated Lumbar Exercises  seated modified PWR exercises 1x10 for lumbar ROM (PWR up and PWR twist) and posture     Other Seated Lumbar Exercises  Seated thoracic extension with foam x 10, UE scaption "Y"  Lumbar Exercises: Supine   Other Supine Lumbar Exercises  stargazer stretch x 2 min      Manual Therapy   Manual Therapy  Soft tissue mobilization    Manual therapy comments  separate from all otehr skilled services     Soft tissue mobilization  Lt sidelying to Rt glut with ball assistance               PT Short Term Goals - 12/07/16 1213      PT SHORT TERM GOAL #1   Title  Patient to experience pain as being no more than 2/10 at worst in order to improve QOL and improve functional task performance     Time  4    Period  Weeks    Status  New    Target Date  01/04/17      PT SHORT TERM GOAL #2   Title  Patient to be able to [facility] correct posture at least 50% of the time without external cues, patient and spouse also to be able to demonstrate placement of pads/pillows to assist with postural correction with cues no more than 20% of the time     Time  4    Period  Weeks    Status  New      PT SHORT TERM GOAL #3   Title  Patient to be independent with correct supine to sit/sit to supine mechanics in order to reduce stress on lumbar spine with functional mobility     Time  4    Period  Weeks    Status  New       PT SHORT TERM GOAL #4   Title  Patient to be compliant with correct performance of HEP, to be updated as appropriate     Time  1    Period  Weeks    Status  New    Target Date  12/14/16        PT Long Term Goals - 12/07/16 1216      PT LONG TERM GOAL #1   Title  Patient to demonstrate an improvement of at least 1 MMT grade in all tested groups in order to improve gait pattern  and balance    Time  8    Period  Weeks    Status  New    Target Date  02/01/17      PT LONG TERM GOAL #2   Title  Patient to be able to complete TUG test in 14 seconds or less with LRAD in order to show improved mobility and balance     Time  8    Period  Weeks    Status  New      PT LONG TERM GOAL #3   Title  Patient to demonstrate at least a 50% improvement in muscle flexibility deficits in order to reduce pain and improve mechanics     Time  8    Period  Weeks    Status  New      PT LONG TERM GOAL #4   Title  Patient and spouse to be able to verbally explain the benefits of PWR/HIIT training for Parkinsons disease, and will be participatory in PWR based exercise program to assist in maintaining flexibility, trunk/hip mobility, and prevent worsening of back pain due to Parkinsonian changes     Time  8    Period  Weeks    Status  New  Plan - 12/26/16 1454    Clinical Impression Statement  Pt reports vast relief following manual to address his "knots" on hips for pain control.  Session focus on lumbar mobiltiy exercises to address tissue tension and PWR postural strenghtening.  Pt required verbal and tactile cueing to improve activities iwth proper form.  EOS with manual to address tightness in gluteal region to improve mobilty with gait, reports of relief following.      Rehab Potential  Good    Clinical Impairments Affecting Rehab Potential  (+) very motivated, supportive family; (-) chronicity of pain, parkinsons    PT Frequency  2x / week    PT Duration  8 weeks    PT  Treatment/Interventions  ADLs/Self Care Home Management;Biofeedback;Cryotherapy;Moist Heat;DME Instruction;Gait training;Stair training;Functional mobility training;Therapeutic activities;Therapeutic exercise;Balance training;Neuromuscular re-education;Patient/family education;Manual techniques;Passive range of motion;Dry needling;Energy conservation;Taping    PT Next Visit Plan  continue working on lubmar and hip flexibility/mobility, continue manual to L LE and lumbar paraspinals as appropriate; continue modified seated PWR exercises for lumbar ROM/core activation     PT Home Exercise Plan  Eval: 3D hip excursions, starfish stretch, HS stretch 10/30: SKTC, lumbar rotation        Patient will benefit from skilled therapeutic intervention in order to improve the following deficits and impairments:  Abnormal gait, Improper body mechanics, Pain, Decreased coordination, Decreased mobility, Postural dysfunction, Decreased range of motion, Decreased strength, Hypomobility, Impaired UE functional use, Decreased balance, Difficulty walking, Impaired flexibility  Visit Diagnosis: Chronic bilateral low back pain without sciatica  Pain in right hip     Problem List Patient Active Problem List   Diagnosis Date Noted  . S/P ERCP   . Biliary obstruction   . Pancreatitis, acute   . Choledocholithiasis   . Total bilirubin, elevated   . Cholelithiasis   . Elevated LFTs   . Pancreatitis 09/02/2015  . Parkinson disease ([place])   . Diabetes mellitus without complication ([place])   . Hypertension   . Essential hypertension   . Jaundice   . Chronic low back pain 03/24/2015  . OSA (obstructive sleep apnea) 04/10/2014  . Paralysis agitans [facility]) 10/07/2012   Ihor Austin, [place]; [place]  Aldona Lento 12/26/2016, 2:58 PM  [place] [address] [place], [place], [address] Phone: [phone]   Fax:  [phone]  Name: Elijah [name] MRN: [id number] Date of Birth: 1940/03/17

## 2016-12-28 ENCOUNTER — Encounter (HOSPITAL_COMMUNITY): Payer: Self-pay

## 2016-12-28 ENCOUNTER — Ambulatory Visit (HOSPITAL_COMMUNITY): Payer: PPO

## 2016-12-28 DIAGNOSIS — R262 Difficulty in walking, not elsewhere classified: Secondary | ICD-10-CM

## 2016-12-28 DIAGNOSIS — M545 Low back pain: Secondary | ICD-10-CM | POA: Diagnosis not present

## 2016-12-28 DIAGNOSIS — R29898 Other symptoms and signs involving the musculoskeletal system: Secondary | ICD-10-CM

## 2016-12-28 DIAGNOSIS — G8929 Other chronic pain: Secondary | ICD-10-CM

## 2016-12-28 DIAGNOSIS — M6281 Muscle weakness (generalized): Secondary | ICD-10-CM

## 2016-12-28 DIAGNOSIS — M25551 Pain in right hip: Secondary | ICD-10-CM

## 2016-12-28 DIAGNOSIS — R293 Abnormal posture: Secondary | ICD-10-CM

## 2016-12-28 NOTE — Therapy (Signed)
[place], [place], [address] Phone: [phone]   Fax:  [phone]  Physical Therapy Treatment  Patient Details  Name: Elijah [name] MRN: [id number] Date of Birth: 12-Oct-1940 Referring Provider: Celene Squibb    Encounter Date: 12/28/2016  PT End of Session - 12/28/16 1048    Visit Number  7    Number of Visits  17    Date for PT Re-Evaluation  01/04/17    Authorization Type  [employer]     Authorization Time Period  12/07/16 to 02/06/17    Authorization - Visit Number  7    Authorization - Number of Visits  10    PT Start Time  6269    PT Stop Time  1120    PT Time Calculation (min)  46 min    Activity Tolerance  Patient tolerated treatment well;No increased pain    Behavior During Therapy  WFL for tasks assessed/performed       Past Medical History:  Diagnosis Date  . Chronic low back pain 03/24/2015  . Degenerative arthritis   . Diabetes mellitus without complication ([place])   . Dyslipidemia   . Glaucoma   . Gout 10/07/2012  . Hypertension   . Low back pain   . Obesity   . OSA (obstructive sleep apnea) 04/10/2014  . Pancreatitis   . Parkinson disease ([place])   . Rotator cuff tear    Bilateral, no surgery    Past Surgical History:  Procedure Laterality Date  . APPENDECTOMY    . HERNIA REPAIR    . SHOULDER BONE SPUR Left    RESECTION  . TONSILLECTOMY      There were no vitals filed for this visit.  Subjective Assessment - 12/28/16 1040    Subjective  Pt stated he cleaned out chimeny last night, feels a little stopped up now.  Reports relief following stretches and massage to address tightness and pain relief following.  Current pain only over Rt hip, pain scale 2/10    Patient Stated Goals  sit up and walk straight, get back to doing things I'd like to do     Currently in Pain?  Yes    Pain Score  2     Pain Location  Hip    Pain Orientation  Right    Pain Descriptors / Indicators   Tightness    Pain Type  Chronic pain    Pain Onset  More than a month ago    Pain Frequency  Constant    Aggravating Factors   unsure    Pain Relieving Factors  heat    Effect of Pain on Daily Activities  moderate                      [facility] Adult PT Treatment/Exercise - 12/28/16 0001      Lumbar Exercises: Stretches   Active Hamstring Stretch  3 reps;30 seconds    Active Hamstring Stretch Limitations  supine     Single Knee to Chest Stretch  3 reps;20 seconds    Single Knee to Chest Stretch Limitations  B, towel assistance    Lower Trunk Rotation  10 seconds    Lower Trunk Rotation Limitations  10 repsx 10" holds BLE      Lumbar Exercises: Seated   Other Seated Lumbar Exercises  seated modified PWR exercises 1x10 for lumbar ROM (PWR up and PWR twist) and posture  Other Seated Lumbar Exercises  Seated thoracic extension with foam x 10, UE scaption "Y"; scapular retraction       Lumbar Exercises: Supine   Other Supine Lumbar Exercises  stargazer stretch x 2 min      Manual Therapy   Manual Therapy  Soft tissue mobilization    Manual therapy comments  separate from all otehr skilled services     Soft tissue mobilization  Lt sidelying to Rt glut with ball assistance               PT Short Term Goals - 12/07/16 1213      PT SHORT TERM GOAL #1   Title  Patient to experience pain as being no more than 2/10 at worst in order to improve QOL and improve functional task performance     Time  4    Period  Weeks    Status  New    Target Date  01/04/17      PT SHORT TERM GOAL #2   Title  Patient to be able to [facility] correct posture at least 50% of the time without external cues, patient and spouse also to be able to demonstrate placement of pads/pillows to assist with postural correction with cues no more than 20% of the time     Time  4    Period  Weeks    Status  New      PT SHORT TERM GOAL #3   Title  Patient to be independent with correct supine to  sit/sit to supine mechanics in order to reduce stress on lumbar spine with functional mobility     Time  4    Period  Weeks    Status  New      PT SHORT TERM GOAL #4   Title  Patient to be compliant with correct performance of HEP, to be updated as appropriate     Time  1    Period  Weeks    Status  New    Target Date  12/14/16        PT Long Term Goals - 12/07/16 1216      PT LONG TERM GOAL #1   Title  Patient to demonstrate an improvement of at least 1 MMT grade in all tested groups in order to improve gait pattern  and balance    Time  8    Period  Weeks    Status  New    Target Date  02/01/17      PT LONG TERM GOAL #2   Title  Patient to be able to complete TUG test in 14 seconds or less with LRAD in order to show improved mobility and balance     Time  8    Period  Weeks    Status  New      PT LONG TERM GOAL #3   Title  Patient to demonstrate at least a 50% improvement in muscle flexibility deficits in order to reduce pain and improve mechanics     Time  8    Period  Weeks    Status  New      PT LONG TERM GOAL #4   Title  Patient and spouse to be able to verbally explain the benefits of PWR/HIIT training for Parkinsons disease, and will be participatory in PWR based exercise program to assist in maintaining flexibility, trunk/hip mobility, and prevent worsening of back pain due to Parkinsonian changes     Time  8    Period  Weeks    Status  New            Plan - 12/28/16 1102    Clinical Impression Statement  Session focus initialy with lumbar mobiltiy  for pain control and postural education/strengthening.  Pt presents with improved bed mobility and sitting posture with ability to sit up without leaning to Rt without HHA, does continue to present with kyphotic forward headed and rounded shoulders posture.    Rehab Potential  Good    Clinical Impairments Affecting Rehab Potential  (+) very motivated, supportive family; (-) chronicity of pain, parkinsons    PT  Frequency  2x / week    PT Duration  8 weeks    PT Treatment/Interventions  ADLs/Self Care Home Management;Biofeedback;Cryotherapy;Moist Heat;DME Instruction;Gait training;Stair training;Functional mobility training;Therapeutic activities;Therapeutic exercise;Balance training;Neuromuscular re-education;Patient/family education;Manual techniques;Passive range of motion;Dry needling;Energy conservation;Taping    PT Next Visit Plan  continue working on lubmar and hip flexibility/mobility, continue manual to L LE and lumbar paraspinals as appropriate; continue modified seated PWR exercises for lumbar ROM/core activation.  Add decompression exercises for postural strengthening next session.    PT Home Exercise Plan  Eval: 3D hip excursions, starfish stretch, HS stretch 10/30: SKTC, lumbar rotation     Consulted and Agree with Plan of Care  Patient;Family member/caregiver       Patient will benefit from skilled therapeutic intervention in order to improve the following deficits and impairments:  Abnormal gait, Improper body mechanics, Pain, Decreased coordination, Decreased mobility, Postural dysfunction, Decreased range of motion, Decreased strength, Hypomobility, Impaired UE functional use, Decreased balance, Difficulty walking, Impaired flexibility  Visit Diagnosis: Chronic bilateral low back pain without sciatica  Pain in right hip  Abnormal posture  Difficulty in walking, not elsewhere classified  Other symptoms and signs involving the musculoskeletal system  Muscle weakness (generalized)     Problem List Patient Active Problem List   Diagnosis Date Noted  . S/P ERCP   . Biliary obstruction   . Pancreatitis, acute   . Choledocholithiasis   . Total bilirubin, elevated   . Cholelithiasis   . Elevated LFTs   . Pancreatitis 09/02/2015  . Parkinson disease ([place])   . Diabetes mellitus without complication ([place])   . Hypertension   . Essential hypertension   . Jaundice   . Chronic  low back pain 03/24/2015  . OSA (obstructive sleep apnea) 04/10/2014  . Paralysis agitans [facility]) 10/07/2012   Ihor Austin, LPTA; [place]  Aldona Lento 12/28/2016, 11:45 AM  [place], [place], [address] Phone: [phone]   Fax:  [phone]  Name: Elijah [name] MRN: [id number] Date of Birth: 1940-06-10

## 2017-01-02 ENCOUNTER — Ambulatory Visit (HOSPITAL_COMMUNITY): Payer: PPO | Admitting: Physical Therapy

## 2017-01-02 ENCOUNTER — Encounter (HOSPITAL_COMMUNITY): Payer: Self-pay | Admitting: Physical Therapy

## 2017-01-02 DIAGNOSIS — R29898 Other symptoms and signs involving the musculoskeletal system: Secondary | ICD-10-CM

## 2017-01-02 DIAGNOSIS — M545 Low back pain, unspecified: Secondary | ICD-10-CM

## 2017-01-02 DIAGNOSIS — M25551 Pain in right hip: Secondary | ICD-10-CM

## 2017-01-02 DIAGNOSIS — R293 Abnormal posture: Secondary | ICD-10-CM

## 2017-01-02 DIAGNOSIS — M6281 Muscle weakness (generalized): Secondary | ICD-10-CM

## 2017-01-02 DIAGNOSIS — G8929 Other chronic pain: Secondary | ICD-10-CM

## 2017-01-02 DIAGNOSIS — R262 Difficulty in walking, not elsewhere classified: Secondary | ICD-10-CM

## 2017-01-02 NOTE — Therapy (Signed)
[place], [place], [address] Phone: [phone]   Fax:  [phone]  Physical Therapy Treatment (RE-Assessment)  Patient Details  Name: Elijah [name] MRN: [id number] Date of Birth: Dec 13, 1940 Referring Provider: Celene Squibb    Encounter Date: 01/02/2017  PT End of Session - 01/02/17 1117    Visit Number  8    Number of Visits  17    Date for PT Re-Evaluation  01/30/17    Authorization Type  [employer]     Authorization Time Period  12/07/16 to 02/06/17    Authorization - Visit Number  8    Authorization - Number of Visits  18    PT Start Time  1031    PT Stop Time  1111    PT Time Calculation (min)  40 min    Activity Tolerance  Patient tolerated treatment well    Behavior During Therapy  [facility] for tasks assessed/performed       Past Medical History:  Diagnosis Date  . Chronic low back pain 03/24/2015  . Degenerative arthritis   . Diabetes mellitus without complication ([place])   . Dyslipidemia   . Glaucoma   . Gout 10/07/2012  . Hypertension   . Low back pain   . Obesity   . OSA (obstructive sleep apnea) 04/10/2014  . Pancreatitis   . Parkinson disease ([place])   . Rotator cuff tear    Bilateral, no surgery    Past Surgical History:  Procedure Laterality Date  . APPENDECTOMY    . HERNIA REPAIR    . SHOULDER BONE SPUR Left    RESECTION  . TONSILLECTOMY      There were no vitals filed for this visit.  Subjective Assessment - 01/02/17 1113    Subjective  patient arrives stating he is feeling well, his beggest concern is taking care of this muscle spasm and continuing to keep up with everything. He feels he is getting better as a whole.     Patient Stated Goals  sit up and walk straight, get back to doing things I'd like to do     Currently in Pain?  Yes    Pain Score  1     Pain Location  Back    Pain Orientation  Right;Left    Pain Descriptors / Indicators  Tightness    Pain  Radiating Towards  none     Pain Onset  More than a month ago    Pain Frequency  Constant    Aggravating Factors   being in one position too long     Pain Relieving Factors  heat     Effect of Pain on Daily Activities  moderate          [facility] PT Assessment - 01/02/17 0001      AROM   Lumbar Flexion  WFL     Lumbar Extension  mild limitation     Lumbar - Right Side Bend  [facility]     Lumbar - Left Side Bend  [facility]       Strength   Right Hip Flexion  3/5    Right Hip ABduction  4+/5    Left Hip Flexion  5/5    Left Hip ABduction  3/5    Right Knee Flexion  5/5    Right Knee Extension  4+/5    Left Knee Flexion  5/5    Left Knee Extension  4+/5  Right Ankle Dorsiflexion  5/5    Left Ankle Dorsiflexion  5/5      Flexibility   Hamstrings  mild limitation     Piriformis  severe limitation       High Level Balance   High Level Balance Comments  TUG 17.7 no device; mild defict retrogait                   [facility] Adult PT Treatment/Exercise - 01/02/17 0001      Manual Therapy   Manual Therapy  Soft tissue mobilization    Manual therapy comments  separate from all otehr skilled services     Soft tissue mobilization  Lt sidelying to Rt glut and lateral thigh with ball assistance             PT Education - 01/02/17 1116    Education provided  Yes    Education Details  progress thus far, POC recommended, benefits of continuing with PT     Person(s) Educated  Patient    Methods  Explanation    Comprehension  Verbalized understanding       PT Short Term Goals - 01/02/17 1118      PT SHORT TERM GOAL #1   Title  Patient to experience pain as being no more than 2/10 at worst in order to improve QOL and improve functional task performance     Time  4    Period  Weeks    Status  On-going      PT SHORT TERM GOAL #2   Title  Patient to be able to [facility] correct posture at least 50% of the time without external cues, patient and spouse also to be able to  demonstrate placement of pads/pillows to assist with postural correction with cues no more than 20% of the time     Time  4    Period  Weeks    Status  On-going      PT SHORT TERM GOAL #3   Title  Patient to be independent with correct supine to sit/sit to supine mechanics in order to reduce stress on lumbar spine with functional mobility     Time  4    Status  On-going      PT SHORT TERM GOAL #4   Title  Patient to be compliant with correct performance of HEP, to be updated as appropriate     Time  1    Period  Weeks    Status  Achieved        PT Long Term Goals - 01/02/17 1118      PT LONG TERM GOAL #1   Title  Patient to demonstrate an improvement of at least 1 MMT grade in all tested groups in order to improve gait pattern  and balance    Time  8    Period  Weeks    Status  Partially Met      PT LONG TERM GOAL #2   Title  Patient to be able to complete TUG test in 14 seconds or less with LRAD in order to show improved mobility and balance     Time  8    Period  Weeks    Status  On-going      PT LONG TERM GOAL #3   Title  Patient to demonstrate at least a 50% improvement in muscle flexibility deficits in order to reduce pain and improve mechanics     Time  8  Period  Weeks    Status  On-going      PT LONG TERM GOAL #4   Title  Patient and spouse to be able to verbally explain the benefits of PWR/HIIT training for Parkinsons disease, and will be participatory in PWR based exercise program to assist in maintaining flexibility, trunk/hip mobility, and prevent worsening of back pain due to Parkinsonian changes     Time  8    Period  Weeks    Status  On-going            Plan - 01-14-17 1117    Clinical Impression Statement  paitent is progressing well with skilled PT services, however continues to demonstrate considerable soft tissue limitations as well as ongoing low back pain, functional muscle weakness, and mild balance deficit. Recommend continuation of  skilled PT services in order to addrses functional limitations, erduce pain, and optimize overall level of function moving forward.    Rehab Potential  Good    Clinical Impairments Affecting Rehab Potential  (+) very motivated, supportive family; (-) chronicity of pain, parkinsons    PT Frequency  2x / week    PT Duration  4 weeks    PT Treatment/Interventions  ADLs/Self Care Home Management;Biofeedback;Cryotherapy;Moist Heat;DME Instruction;Gait training;Stair training;Functional mobility training;Therapeutic activities;Therapeutic exercise;Balance training;Neuromuscular re-education;Patient/family education;Manual techniques;Passive range of motion;Dry needling;Energy conservation;Taping    PT Next Visit Plan  continue manual and lumbar/hip mobility. Focus on band postural training rather than decompression exercises. Modified PWR exercises. Hip flexor and TFL stretches.     PT Home Exercise Plan  Eval: 3D hip excursions, starfish stretch, HS stretch 10/30: SKTC, lumbar rotation     Consulted and Agree with Plan of Care  Patient       Patient will benefit from skilled therapeutic intervention in order to improve the following deficits and impairments:  Abnormal gait, Improper body mechanics, Pain, Decreased coordination, Decreased mobility, Postural dysfunction, Decreased range of motion, Decreased strength, Hypomobility, Impaired UE functional use, Decreased balance, Difficulty walking, Impaired flexibility  Visit Diagnosis: Chronic bilateral low back pain without sciatica  Pain in right hip  Abnormal posture  Difficulty in walking, not elsewhere classified  Other symptoms and signs involving the musculoskeletal system  Muscle weakness (generalized)   G-Codes - 14-Jan-2017 1119    Functional Assessment Tool Used (Outpatient Only)  Based on skilled clinical assessment of posture, strength, gait, balance, flexibility, mobility     Functional Limitation  Mobility: Walking and moving around     Mobility: Walking and Moving Around Current Status (L3810)  At least 20 percent but less than 40 percent impaired, limited or restricted    Mobility: Walking and Moving Around Goal Status [phone])  At least 20 percent but less than 40 percent impaired, limited or restricted       Problem List Patient Active Problem List   Diagnosis Date Noted  . S/P ERCP   . Biliary obstruction   . Pancreatitis, acute   . Choledocholithiasis   . Total bilirubin, elevated   . Cholelithiasis   . Elevated LFTs   . Pancreatitis 09/02/2015  . Parkinson disease ([place])   . Diabetes mellitus without complication ([place])   . Hypertension   . Essential hypertension   . Jaundice   . Chronic low back pain 03/24/2015  . OSA (obstructive sleep apnea) 04/10/2014  . Paralysis agitans ([place]) 10/07/2012    Deniece Ree PT, DPT, CBIS  Supplemental Physical Therapist [facility] [place], [place], [address] Phone: [phone]   Fax:  [phone]  Name: ARLESTER KEEHAN MRN: [id number] Date of Birth: 10-17-1940

## 2017-01-04 ENCOUNTER — Encounter (HOSPITAL_COMMUNITY): Payer: Self-pay

## 2017-01-04 ENCOUNTER — Ambulatory Visit (HOSPITAL_COMMUNITY): Payer: PPO

## 2017-01-04 DIAGNOSIS — R29898 Other symptoms and signs involving the musculoskeletal system: Secondary | ICD-10-CM

## 2017-01-04 DIAGNOSIS — M25551 Pain in right hip: Secondary | ICD-10-CM

## 2017-01-04 DIAGNOSIS — M545 Low back pain: Secondary | ICD-10-CM | POA: Diagnosis not present

## 2017-01-04 DIAGNOSIS — M6281 Muscle weakness (generalized): Secondary | ICD-10-CM

## 2017-01-04 DIAGNOSIS — R293 Abnormal posture: Secondary | ICD-10-CM

## 2017-01-04 DIAGNOSIS — R262 Difficulty in walking, not elsewhere classified: Secondary | ICD-10-CM

## 2017-01-04 DIAGNOSIS — G8929 Other chronic pain: Secondary | ICD-10-CM

## 2017-01-04 NOTE — Therapy (Signed)
[place], [place], [address] Phone: [phone]   Fax:  [phone]  Physical Therapy Treatment  Patient Details  Name: Elijah [name] MRN: [id number] Date of Birth: 03/24/1940 Referring Provider: Celene Squibb    Encounter Date: 01/04/2017  PT End of Session - 01/04/17 1043    Visit Number  9    Number of Visits  17    Date for PT Re-Evaluation  01/30/17    Authorization Type  [employer]     Authorization Time Period  12/07/16 to 02/06/17    Authorization - Visit Number  9    Authorization - Number of Visits  18    PT Start Time  1037    PT Stop Time  1122    PT Time Calculation (min)  45 min    Activity Tolerance  Patient tolerated treatment well    Behavior During Therapy  [facility] for tasks assessed/performed       Past Medical History:  Diagnosis Date  . Chronic low back pain 03/24/2015  . Degenerative arthritis   . Diabetes mellitus without complication ([place])   . Dyslipidemia   . Glaucoma   . Gout 10/07/2012  . Hypertension   . Low back pain   . Obesity   . OSA (obstructive sleep apnea) 04/10/2014  . Pancreatitis   . Parkinson disease ([place])   . Rotator cuff tear    Bilateral, no surgery    Past Surgical History:  Procedure Laterality Date  . APPENDECTOMY    . BALLOON DILATION N/A 09/06/2015   Procedure: BALLOON DILATION;  Surgeon: Daneil Dolin, MD;  [facility];  Service: Endoscopy;  Laterality: N/A;  . BILIARY STENT PLACEMENT N/A 09/08/2015   Procedure: BILIARY STENT PLACEMENT;  Surgeon: Daneil Dolin, MD;  [facility];  Service: Endoscopy;  Laterality: N/A;  . ERCP N/A 09/06/2015   Procedure: ENDOSCOPIC RETROGRADE CHOLANGIOPANCREATOGRAPHY (ERCP);  Surgeon: Daneil Dolin, MD;  [facility];  Service: Endoscopy;  Laterality: N/A;  . ERCP N/A 09/08/2015   Procedure: ENDOSCOPIC RETROGRADE CHOLANGIOPANCREATOGRAPHY (ERCP);  Surgeon: Daneil Dolin, MD;   [facility];  Service: Endoscopy;  Laterality: N/A;  with bile duct brushings  . HERNIA REPAIR    . SHOULDER BONE SPUR Left    RESECTION  . SPHINCTEROTOMY N/A 09/06/2015   Procedure: SPHINCTEROTOMY;  Surgeon: Daneil Dolin, MD;  [facility]: [facility];  Service: Endoscopy;  Laterality: N/A;  . TONSILLECTOMY      There were no vitals filed for this visit.  Subjective Assessment - 01/04/17 1033    Subjective  Pt stated he is stiff today, continues to c/o tightness around his belt line, no reoprts of pain    Patient Stated Goals  sit up and walk straight, get back to doing things I'd like to do     Currently in Pain?  No/denies                      [facility] Adult PT Treatment/Exercise - 01/04/17 0001      Lumbar Exercises: Stretches   Lower Trunk Rotation Limitations  10 repsx 10" holds BLE    Hip Flexor Stretch  2 reps;30 seconds stanidng on 2nd step      Lumbar Exercises: Standing   Row  10 reps;Theraband    Theraband Level (Row)  Level 2 (Red)    Other Standing Lumbar Exercises  3D hip excursion 10x (no extension)      Lumbar Exercises: Supine   Other Supine Lumbar Exercises  stargazer stretch x 2 min      Manual Therapy   Manual Therapy  Soft tissue mobilization    Manual therapy comments  separate from all otehr skilled services     Soft tissue mobilization  Lt sidelying to Rt glut and lateral thigh, TFL               PT Short Term Goals - 01/02/17 1118      PT SHORT TERM GOAL #1   Title  Patient to experience pain as being no more than 2/10 at worst in order to improve QOL and improve functional task performance     Time  4    Period  Weeks    Status  On-going      PT SHORT TERM GOAL #2   Title  Patient to be able to [facility] correct posture at least 50% of the time without external cues, patient and spouse also to be able to demonstrate placement of pads/pillows to assist with postural correction with cues no more than 20% of the time      Time  4    Period  Weeks    Status  On-going      PT SHORT TERM GOAL #3   Title  Patient to be independent with correct supine to sit/sit to supine mechanics in order to reduce stress on lumbar spine with functional mobility     Time  4    Status  On-going      PT SHORT TERM GOAL #4   Title  Patient to be compliant with correct performance of HEP, to be updated as appropriate     Time  1    Period  Weeks    Status  Achieved        PT Long Term Goals - 01/02/17 1118      PT LONG TERM GOAL #1   Title  Patient to demonstrate an improvement of at least 1 MMT grade in all tested groups in order to improve gait pattern  and balance    Time  8    Period  Weeks    Status  Partially Met      PT LONG TERM GOAL #2   Title  Patient to be able to complete TUG test in 14 seconds or less with LRAD in order to show improved mobility and balance     Time  8    Period  Weeks    Status  On-going      PT LONG TERM GOAL #3   Title  Patient to demonstrate at least a 50% improvement in muscle flexibility deficits in order to reduce pain and improve mechanics     Time  8    Period  Weeks    Status  On-going      PT LONG TERM GOAL #4   Title  Patient and spouse to be able to verbally explain the benefits of PWR/HIIT training for Parkinsons disease, and will be participatory in PWR based exercise program to assist in maintaining flexibility, trunk/hip mobility, and prevent worsening of back pain due to Parkinsonian changes     Time  8    Period  Weeks    Status  On-going            Plan - 01/04/17 1150    Clinical Impression Statement  Session  focus on lumbar mobility to address stiffness and education on importance of posture for pain control.  Added theraband postural strengthening.  Therapist facilitation through session to improve awareness of posture and proper form wtih therex.  EOS with manual STM to Rt piriformis and passive TFL stretch to improve mobility.  Reports of relief  following manual.      Rehab Potential  Good    Clinical Impairments Affecting Rehab Potential  (+) very motivated, supportive family; (-) chronicity of pain, parkinsons    PT Frequency  2x / week    PT Duration  4 weeks    PT Treatment/Interventions  ADLs/Self Care Home Management;Biofeedback;Cryotherapy;Moist Heat;DME Instruction;Gait training;Stair training;Functional mobility training;Therapeutic activities;Therapeutic exercise;Balance training;Neuromuscular re-education;Patient/family education;Manual techniques;Passive range of motion;Dry needling;Energy conservation;Taping    PT Next Visit Plan  Add supine cervical and scapular retraction for postural strengthening and theraband.  continue manual and lumbar/hip mobility. Modified PWR exercises     PT Home Exercise Plan  Eval: 3D hip excursions, starfish stretch, HS stretch 10/30: SKTC, lumbar rotation        Patient will benefit from skilled therapeutic intervention in order to improve the following deficits and impairments:  Abnormal gait, Improper body mechanics, Pain, Decreased coordination, Decreased mobility, Postural dysfunction, Decreased range of motion, Decreased strength, Hypomobility, Impaired UE functional use, Decreased balance, Difficulty walking, Impaired flexibility  Visit Diagnosis: Chronic bilateral low back pain without sciatica  Pain in right hip  Abnormal posture  Difficulty in walking, not elsewhere classified  Other symptoms and signs involving the musculoskeletal system  Muscle weakness (generalized)     Problem List Patient Active Problem List   Diagnosis Date Noted  . S/P ERCP   . Biliary obstruction   . Pancreatitis, acute   . Choledocholithiasis   . Total bilirubin, elevated   . Cholelithiasis   . Elevated LFTs   . Pancreatitis 09/02/2015  . Parkinson disease ([place])   . Diabetes mellitus without complication ([place])   . Hypertension   . Essential hypertension   . Jaundice   . Chronic low  back pain 03/24/2015  . OSA (obstructive sleep apnea) 04/10/2014  . Paralysis agitans ([place]) 10/07/2012   Ihor Austin, LPTA; [place]  Aldona Lento 01/04/2017, 12:00 PM  [place] [address] [place], [place], [address] Phone: [phone]   Fax:  [phone]  Name: Elijah [name] MRN: [id number] Date of Birth: Jul 10, 1940

## 2017-01-09 ENCOUNTER — Ambulatory Visit (HOSPITAL_COMMUNITY): Payer: PPO

## 2017-01-09 ENCOUNTER — Encounter (HOSPITAL_COMMUNITY): Payer: Self-pay

## 2017-01-09 DIAGNOSIS — M545 Low back pain, unspecified: Secondary | ICD-10-CM

## 2017-01-09 DIAGNOSIS — G8929 Other chronic pain: Secondary | ICD-10-CM

## 2017-01-09 DIAGNOSIS — M6281 Muscle weakness (generalized): Secondary | ICD-10-CM

## 2017-01-09 DIAGNOSIS — R29898 Other symptoms and signs involving the musculoskeletal system: Secondary | ICD-10-CM

## 2017-01-09 DIAGNOSIS — R262 Difficulty in walking, not elsewhere classified: Secondary | ICD-10-CM

## 2017-01-09 DIAGNOSIS — M25551 Pain in right hip: Secondary | ICD-10-CM

## 2017-01-09 DIAGNOSIS — R293 Abnormal posture: Secondary | ICD-10-CM

## 2017-01-09 NOTE — Therapy (Signed)
[place], [place], [address] Phone: [phone]   Fax:  [phone]  Physical Therapy Treatment  Patient Details  Name: Elijah [name] MRN: [id number] Date of Birth: 07/02/1940 Referring Provider: Celene Squibb    Encounter Date: 01/09/2017  PT End of Session - 01/09/17 1036    Visit Number  10    Number of Visits  17    Date for PT Re-Evaluation  01/30/17    Authorization Type  [employer]     Authorization Time Period  12/07/16 to 02/06/17; gcodes done visit 8    Authorization - Visit Number  10    Authorization - Number of Visits  18    PT Start Time  2023    PT Stop Time  1112    PT Time Calculation (min)  40 min    Activity Tolerance  Patient tolerated treatment well    Behavior During Therapy  [facility] for tasks assessed/performed       Past Medical History:  Diagnosis Date  . Chronic low back pain 03/24/2015  . Degenerative arthritis   . Diabetes mellitus without complication ([place])   . Dyslipidemia   . Glaucoma   . Gout 10/07/2012  . Hypertension   . Low back pain   . Obesity   . OSA (obstructive sleep apnea) 04/10/2014  . Pancreatitis   . Parkinson disease ([place])   . Rotator cuff tear    Bilateral, no surgery    Past Surgical History:  Procedure Laterality Date  . APPENDECTOMY    . BALLOON DILATION N/A 09/06/2015   Performed by Daneil Dolin, MD at [place]  . BILIARY STENT PLACEMENT N/A 09/08/2015   Performed by Daneil Dolin, MD at [place] (ERCP) N/A 09/08/2015   Performed by Daneil Dolin, MD at [place] (ERCP) N/A 09/06/2015   Performed by Daneil Dolin, MD at [place]  . HERNIA REPAIR    . SHOULDER BONE SPUR Left    RESECTION  . SPHINCTEROTOMY N/A 09/06/2015   Performed by Daneil Dolin, MD at [facility]  . TONSILLECTOMY      There were no  vitals filed for this visit.  Subjective Assessment - 01/09/17 1030    Subjective  Pt stated he feels like he used every muscle in his body helping to move wood into home for warmth, was tired.  No reports of pain today, continues to feel tight on hip muscles    Patient Stated Goals  sit up and walk straight, get back to doing things I'd like to do     Currently in Pain?  No/denies                      [facility] Adult PT Treatment/Exercise - 01/09/17 0001      Lumbar Exercises: Standing   Row  10 reps;Theraband    Theraband Level (Row)  Level 2 (Red)    Shoulder Extension  10 reps;Theraband    Theraband Level (Shoulder Extension)  Level 2 (Red)    Other Standing Lumbar Exercises  3D hip excursion 10x (no extension)      Lumbar Exercises: Seated   Other Seated Lumbar Exercises  wback for posture strengthening    Other Seated Lumbar Exercises  seated thoracic side bend and extension      Lumbar Exercises:  Supine   Other Supine Lumbar Exercises  stargazer stretch x 2 min    Other Supine Lumbar Exercises  cervical then scapular retraction 10x 3"      Lumbar Exercises: Sidelying   Other Sidelying Lumbar Exercises  1/2 bolster with Lt sidelying and manual to QL and TFL      Manual Therapy   Manual Therapy  Soft tissue mobilization    Manual therapy comments  separate from all otehr skilled services     Soft tissue mobilization  Lt sidelying to Rt glut and lateral thigh, TFL               PT Short Term Goals - 01/02/17 1118      PT SHORT TERM GOAL #1   Title  Patient to experience pain as being no more than 2/10 at worst in order to improve QOL and improve functional task performance     Time  4    Period  Weeks    Status  On-going      PT SHORT TERM GOAL #2   Title  Patient to be able to [facility] correct posture at least 50% of the time without external cues, patient and spouse also to be able to demonstrate placement of pads/pillows to assist with postural  correction with cues no more than 20% of the time     Time  4    Period  Weeks    Status  On-going      PT SHORT TERM GOAL #3   Title  Patient to be independent with correct supine to sit/sit to supine mechanics in order to reduce stress on lumbar spine with functional mobility     Time  4    Status  On-going      PT SHORT TERM GOAL #4   Title  Patient to be compliant with correct performance of HEP, to be updated as appropriate     Time  1    Period  Weeks    Status  Achieved        PT Long Term Goals - 01/02/17 1118      PT LONG TERM GOAL #1   Title  Patient to demonstrate an improvement of at least 1 MMT grade in all tested groups in order to improve gait pattern  and balance    Time  8    Period  Weeks    Status  Partially Met      PT LONG TERM GOAL #2   Title  Patient to be able to complete TUG test in 14 seconds or less with LRAD in order to show improved mobility and balance     Time  8    Period  Weeks    Status  On-going      PT LONG TERM GOAL #3   Title  Patient to demonstrate at least a 50% improvement in muscle flexibility deficits in order to reduce pain and improve mechanics     Time  8    Period  Weeks    Status  On-going      PT LONG TERM GOAL #4   Title  Patient and spouse to be able to verbally explain the benefits of PWR/HIIT training for Parkinsons disease, and will be participatory in PWR based exercise program to assist in maintaining flexibility, trunk/hip mobility, and prevent worsening of back pain due to Parkinsonian changes     Time  8    Period  Weeks  Status  On-going            Plan - 01/09/17 1122    Clinical Impression Statement  Continued session focus wiht lumbar mobility with increased focus on posture this session.  Pt continues to present with kyphotic posture with Rt side bend upon standing.  Used mirror as feed back to increase self awareness with verbal and tactile cueing with all exercises.  EOS with manual STM to Rt  piriformis and passive TFL stretch to address restrictions for mobility.  No reports of pain through session.      Rehab Potential  Good    Clinical Impairments Affecting Rehab Potential  (+) very motivated, supportive family; (-) chronicity of pain, parkinsons    PT Frequency  2x / week    PT Duration  4 weeks    PT Treatment/Interventions  ADLs/Self Care Home Management;Biofeedback;Cryotherapy;Moist Heat;DME Instruction;Gait training;Stair training;Functional mobility training;Therapeutic activities;Therapeutic exercise;Balance training;Neuromuscular re-education;Patient/family education;Manual techniques;Passive range of motion;Dry needling;Energy conservation;Taping    PT Next Visit Plan  Continue to address posture and spinal mobiltiy.  Modified PWR exercises.  Manual to address restrictions as well.      PT Home Exercise Plan  Eval: 3D hip excursions, starfish stretch, HS stretch 10/30: SKTC, lumbar rotation        Patient will benefit from skilled therapeutic intervention in order to improve the following deficits and impairments:  Abnormal gait, Improper body mechanics, Pain, Decreased coordination, Decreased mobility, Postural dysfunction, Decreased range of motion, Decreased strength, Hypomobility, Impaired UE functional use, Decreased balance, Difficulty walking, Impaired flexibility  Visit Diagnosis: Chronic bilateral low back pain without sciatica  Pain in right hip  Abnormal posture  Difficulty in walking, not elsewhere classified  Other symptoms and signs involving the musculoskeletal system  Muscle weakness (generalized)     Problem List Patient Active Problem List   Diagnosis Date Noted  . S/P ERCP   . Biliary obstruction   . Pancreatitis, acute   . Choledocholithiasis   . Total bilirubin, elevated   . Cholelithiasis   . Elevated LFTs   . Pancreatitis 09/02/2015  . Parkinson disease ([place])   . Diabetes mellitus without complication ([place])   . Hypertension   .  Essential hypertension   . Jaundice   . Chronic low back pain 03/24/2015  . OSA (obstructive sleep apnea) 04/10/2014  . Paralysis agitans [facility]) 10/07/2012   Ihor Austin, [place]; [place]  Aldona Lento 01/09/2017, 11:27 AM  [place], [place], [address] Phone: [phone]   Fax:  [phone]  Name: Elijah [name] MRN: [id number] Date of Birth: 1940/08/16

## 2017-01-16 ENCOUNTER — Encounter (HOSPITAL_COMMUNITY): Payer: Self-pay

## 2017-01-16 ENCOUNTER — Ambulatory Visit (HOSPITAL_COMMUNITY): Payer: PPO

## 2017-01-16 DIAGNOSIS — M545 Low back pain: Principal | ICD-10-CM

## 2017-01-16 DIAGNOSIS — R29898 Other symptoms and signs involving the musculoskeletal system: Secondary | ICD-10-CM

## 2017-01-16 DIAGNOSIS — G8929 Other chronic pain: Secondary | ICD-10-CM

## 2017-01-16 DIAGNOSIS — M6281 Muscle weakness (generalized): Secondary | ICD-10-CM

## 2017-01-16 DIAGNOSIS — M25551 Pain in right hip: Secondary | ICD-10-CM

## 2017-01-16 DIAGNOSIS — R262 Difficulty in walking, not elsewhere classified: Secondary | ICD-10-CM

## 2017-01-16 DIAGNOSIS — R293 Abnormal posture: Secondary | ICD-10-CM

## 2017-01-16 NOTE — Therapy (Signed)
[place], [place], [address] Phone: [phone]   Fax:  [phone]  Physical Therapy Treatment  Patient Details  Name: Elijah [name] MRN: [id number] Date of Birth: 02-24-1940 Referring Provider: Celene Squibb    Encounter Date: 01/16/2017  PT End of Session - 01/16/17 1043    Visit Number  11    Number of Visits  17    Date for PT Re-Evaluation  01/30/17    Authorization Type  [employer]     Authorization Time Period  12/07/16 to 02/06/17; gcodes done visit 8    Authorization - Visit Number  11    Authorization - Number of Visits  18    PT Start Time  1035    PT Stop Time  1115    PT Time Calculation (min)  40 min    Activity Tolerance  Patient tolerated treatment well    Behavior During Therapy  [facility] for tasks assessed/performed       Past Medical History:  Diagnosis Date  . Chronic low back pain 03/24/2015  . Degenerative arthritis   . Diabetes mellitus without complication ([place])   . Dyslipidemia   . Glaucoma   . Gout 10/07/2012  . Hypertension   . Low back pain   . Obesity   . OSA (obstructive sleep apnea) 04/10/2014  . Pancreatitis   . Parkinson disease ([place])   . Rotator cuff tear    Bilateral, no surgery    Past Surgical History:  Procedure Laterality Date  . APPENDECTOMY    . BALLOON DILATION N/A 09/06/2015   Procedure: BALLOON DILATION;  Surgeon: Daneil Dolin, MD;  [facility];  Service: Endoscopy;  Laterality: N/A;  . BILIARY STENT PLACEMENT N/A 09/08/2015   Procedure: BILIARY STENT PLACEMENT;  Surgeon: Daneil Dolin, MD;  Location: [facility];  Service: Endoscopy;  Laterality: N/A;  . ERCP N/A 09/06/2015   Procedure: ENDOSCOPIC RETROGRADE CHOLANGIOPANCREATOGRAPHY (ERCP);  Surgeon: Daneil Dolin, MD;  [facility];  Service: Endoscopy;  Laterality: N/A;  . ERCP N/A 09/08/2015   Procedure: ENDOSCOPIC RETROGRADE CHOLANGIOPANCREATOGRAPHY (ERCP);  Surgeon:  Daneil Dolin, MD;  [facility];  Service: Endoscopy;  Laterality: N/A;  with bile duct brushings  . HERNIA REPAIR    . SHOULDER BONE SPUR Left    RESECTION  . SPHINCTEROTOMY N/A 09/06/2015   Procedure: SPHINCTEROTOMY;  Surgeon: Daneil Dolin, MD;  [facility]: [facility];  Service: Endoscopy;  Laterality: N/A;  . TONSILLECTOMY      There were no vitals filed for this visit.  Subjective Assessment - 01/16/17 1040    Subjective  Pt stated he is feeling good currently, stated he continues to have tightness across lower back.    Patient Stated Goals  sit up and walk straight, get back to doing things I'd like to do     Currently in Pain?  No/denies                      [facility] Adult PT Treatment/Exercise - 01/16/17 0001      Lumbar Exercises: Stretches   Lower Trunk Rotation  3 reps;30 seconds    Lower Trunk Rotation Limitations  modified Lt sidelying rotate to back for QL      Lumbar Exercises: Standing   Row  10 reps;Theraband    Theraband Level (Row)  Level 2 (Red)    Shoulder Extension  10 reps;Theraband    Theraband Level (Shoulder Extension)  Level 2 (Red)    Other Standing Lumbar Exercises  3D hip excursion 10x (no extension)      Lumbar Exercises: Seated   Other Seated Lumbar Exercises  wback for posture strengthening    Other Seated Lumbar Exercises  seated thoracic side bend and extension with 1/2 bolster behind      Lumbar Exercises: Supine   Other Supine Lumbar Exercises  stargazer stretch x 2 min    Other Supine Lumbar Exercises  cervical then scapular retraction 10x 3"      Manual Therapy   Manual Therapy  Soft tissue mobilization    Manual therapy comments  separate from all otehr skilled services     Soft tissue mobilization  Lt sidelying to Rt QL, glut and lateral thigh, TFL               PT Short Term Goals - 01/02/17 1118      PT SHORT TERM GOAL #1   Title  Patient to experience pain as being no more than 2/10 at worst  in order to improve QOL and improve functional task performance     Time  4    Period  Weeks    Status  On-going      PT SHORT TERM GOAL #2   Title  Patient to be able to [facility] correct posture at least 50% of the time without external cues, patient and spouse also to be able to demonstrate placement of pads/pillows to assist with postural correction with cues no more than 20% of the time     Time  4    Period  Weeks    Status  On-going      PT SHORT TERM GOAL #3   Title  Patient to be independent with correct supine to sit/sit to supine mechanics in order to reduce stress on lumbar spine with functional mobility     Time  4    Status  On-going      PT SHORT TERM GOAL #4   Title  Patient to be compliant with correct performance of HEP, to be updated as appropriate     Time  1    Period  Weeks    Status  Achieved        PT Long Term Goals - 01/02/17 1118      PT LONG TERM GOAL #1   Title  Patient to demonstrate an improvement of at least 1 MMT grade in all tested groups in order to improve gait pattern  and balance    Time  8    Period  Weeks    Status  Partially Met      PT LONG TERM GOAL #2   Title  Patient to be able to complete TUG test in 14 seconds or less with LRAD in order to show improved mobility and balance     Time  8    Period  Weeks    Status  On-going      PT LONG TERM GOAL #3   Title  Patient to demonstrate at least a 50% improvement in muscle flexibility deficits in order to reduce pain and improve mechanics     Time  8    Period  Weeks    Status  On-going      PT LONG TERM GOAL #4   Title  Patient and spouse to be able to verbally explain the benefits of  PWR/HIIT training for Parkinsons disease, and will be participatory in [place] based exercise program to assist in maintaining flexibility, trunk/hip mobility, and prevent worsening of back pain due to Parkinsonian changes     Time  8    Period  Weeks    Status  On-going            Plan -  01/16/17 1140    Clinical Impression Statement  Continued session focus with lumbar mobility and postural strengthening.  Pt does continue to present with kyphotic forwards rolled shoulder and Rt sidebend upon standings.  Added sitting side bend and QL stretches to address tightness and ended session wiht manual to Rt QL, TFL and gluteal musculature to improve mobility to assist with posture.  No reports of pain at EOS.    Rehab Potential  Good    Clinical Impairments Affecting Rehab Potential  (+) very motivated, supportive family; (-) chronicity of pain, parkinsons    PT Frequency  2x / week    PT Duration  4 weeks    PT Treatment/Interventions  ADLs/Self Care Home Management;Biofeedback;Cryotherapy;Moist Heat;DME Instruction;Gait training;Stair training;Functional mobility training;Therapeutic activities;Therapeutic exercise;Balance training;Neuromuscular re-education;Patient/family education;Manual techniques;Passive range of motion;Dry needling;Energy conservation;Taping    PT Next Visit Plan  Continue to address posture and spinal mobiltiy.  Modified PWR exercises.  Manual to address restrictions as well.      PT Home Exercise Plan  Eval: 3D hip excursions, starfish stretch, HS stretch 10/30: SKTC, lumbar rotation        Patient will benefit from skilled therapeutic intervention in order to improve the following deficits and impairments:  Abnormal gait, Improper body mechanics, Pain, Decreased coordination, Decreased mobility, Postural dysfunction, Decreased range of motion, Decreased strength, Hypomobility, Impaired UE functional use, Decreased balance, Difficulty walking, Impaired flexibility  Visit Diagnosis: Chronic bilateral low back pain without sciatica  Pain in right hip  Abnormal posture  Difficulty in walking, not elsewhere classified  Other symptoms and signs involving the musculoskeletal system  Muscle weakness (generalized)     Problem List Patient Active Problem  List   Diagnosis Date Noted  . S/P ERCP   . Biliary obstruction   . Pancreatitis, acute   . Choledocholithiasis   . Total bilirubin, elevated   . Cholelithiasis   . Elevated LFTs   . Pancreatitis 09/02/2015  . Parkinson disease ([place])   . Diabetes mellitus without complication ([place])   . Hypertension   . Essential hypertension   . Jaundice   . Chronic low back pain 03/24/2015  . OSA (obstructive sleep apnea) 04/10/2014  . Paralysis agitans ([place]) 10/07/2012   Ihor Austin, [place]; [place]  Aldona Lento 01/16/2017, 1:12 PM  [place] [address] [place], [place], [address] Phone: [phone]   Fax:  [phone]  Name: Elijah [name] MRN: [id number] Date of Birth: 1940-11-06

## 2017-01-23 ENCOUNTER — Ambulatory Visit (HOSPITAL_COMMUNITY): Payer: PPO | Attending: Internal Medicine | Admitting: Physical Therapy

## 2017-01-23 DIAGNOSIS — R262 Difficulty in walking, not elsewhere classified: Secondary | ICD-10-CM | POA: Diagnosis not present

## 2017-01-23 DIAGNOSIS — M6281 Muscle weakness (generalized): Secondary | ICD-10-CM | POA: Diagnosis not present

## 2017-01-23 DIAGNOSIS — M25551 Pain in right hip: Secondary | ICD-10-CM | POA: Insufficient documentation

## 2017-01-23 DIAGNOSIS — R293 Abnormal posture: Secondary | ICD-10-CM

## 2017-01-23 DIAGNOSIS — G8929 Other chronic pain: Secondary | ICD-10-CM

## 2017-01-23 DIAGNOSIS — R29898 Other symptoms and signs involving the musculoskeletal system: Secondary | ICD-10-CM | POA: Insufficient documentation

## 2017-01-23 DIAGNOSIS — M545 Low back pain: Secondary | ICD-10-CM | POA: Diagnosis not present

## 2017-01-23 NOTE — Therapy (Addendum)
[place], [place], [address] Phone: [phone]   Fax:  [phone]  Physical Therapy Treatment  Patient Details  Name: Elijah [name] MRN: [id number] Date of Birth: [birth year] Referring Provider: Celene Squibb    Encounter Date: 01/23/2017    PT End of Session - 01/25/17 0942    Visit Number  12    Number of Visits  17    Date for PT Re-Evaluation  01/30/17    Authorization Type  [employer]     Authorization Time Period  12/07/16 to 02/06/17; gcodes done visit 8    Authorization - Visit Number  12    Authorization - Number of Visits  18    PT Start Time  1030    PT Stop Time  1115    PT Time Calculation (min)  45 min       Past Medical History:  Diagnosis Date  . Chronic low back pain 03/24/2015  . Degenerative arthritis   . Diabetes mellitus without complication ([place])   . Dyslipidemia   . Glaucoma   . Gout 10/07/2012  . Hypertension   . Low back pain   . Obesity   . OSA (obstructive sleep apnea) 04/10/2014  . Pancreatitis   . Parkinson disease ([place])   . Rotator cuff tear    Bilateral, no surgery    Past Surgical History:  Procedure Laterality Date  . APPENDECTOMY    . BALLOON DILATION N/A 09/06/2015   Procedure: BALLOON DILATION;  Surgeon: Daneil Dolin, MD;  [facility];  Service: Endoscopy;  Laterality: N/A;  . BILIARY STENT PLACEMENT N/A 09/08/2015   Procedure: BILIARY STENT PLACEMENT;  Surgeon: Daneil Dolin, MD;  [facility];  Service: Endoscopy;  Laterality: N/A;  . ERCP N/A 09/06/2015   Procedure: ENDOSCOPIC RETROGRADE CHOLANGIOPANCREATOGRAPHY (ERCP);  Surgeon: Daneil Dolin, MD;  [facility];  Service: Endoscopy;  Laterality: N/A;  . ERCP N/A 09/08/2015   Procedure: ENDOSCOPIC RETROGRADE CHOLANGIOPANCREATOGRAPHY (ERCP);  Surgeon: Daneil Dolin, MD;  [facility];  Service: Endoscopy;  Laterality: N/A;  with bile duct brushings  .  HERNIA REPAIR    . SHOULDER BONE SPUR Left    RESECTION  . SPHINCTEROTOMY N/A 09/06/2015   Procedure: SPHINCTEROTOMY;  Surgeon: Daneil Dolin, MD;  [facility];  Service: Endoscopy;  Laterality: N/A;  . TONSILLECTOMY      There were no vitals filed for this visit.  Subjective Assessment - 01/23/17 1109    Subjective  Pt states he just has alot of tightness.  States he has some soreness from the exercises and doing too much over the weekend.    Currently in Pain?  No/denies                      [facility] Adult PT Treatment/Exercise - 01/23/17 0001      Lumbar Exercises: Stretches   Lower Trunk Rotation  3 reps;30 seconds    Lower Trunk Rotation Limitations  modified Lt sidelying rotate to back for QL      Lumbar Exercises: Standing   Row  10 reps;Theraband    Theraband Level (Row)  Level 2 (Red)    Shoulder Extension  10 reps;Theraband    Theraband Level (Shoulder Extension)  Level 2 (Red)    Other Standing Lumbar Exercises  3D hip excursion 10x (no extension)      Lumbar  Exercises: Seated   Other Seated Lumbar Exercises  wback for posture strengthening    Other Seated Lumbar Exercises  seated thoracic side bend and extension with 1/2 bolster behind      Lumbar Exercises: Supine   Other Supine Lumbar Exercises  stargazer stretch x 2 min    Other Supine Lumbar Exercises  cervical then scapular retraction 10x 3"      Lumbar Exercises: Sidelying   Other Sidelying Lumbar Exercises  1/2 bolster with Lt sidelying and manual to QL and TFL      Manual Therapy   Manual Therapy  Soft tissue mobilization    Manual therapy comments  separate from all otehr skilled services     Soft tissue mobilization  seated to Lt scap region, Rt sidelying to Lt QL, glut and lateral thigh, TFL               PT Short Term Goals - 01/02/17 1118      PT SHORT TERM GOAL #1   Title  Patient to experience pain as being no more than 2/10 at worst in order to improve QOL  and improve functional task performance     Time  4    Period  Weeks    Status  On-going      PT SHORT TERM GOAL #2   Title  Patient to be able to [facility] correct posture at least 50% of the time without external cues, patient and spouse also to be able to demonstrate placement of pads/pillows to assist with postural correction with cues no more than 20% of the time     Time  4    Period  Weeks    Status  On-going      PT SHORT TERM GOAL #3   Title  Patient to be independent with correct supine to sit/sit to supine mechanics in order to reduce stress on lumbar spine with functional mobility     Time  4    Status  On-going      PT SHORT TERM GOAL #4   Title  Patient to be compliant with correct performance of HEP, to be updated as appropriate     Time  1    Period  Weeks    Status  Achieved        PT Long Term Goals - 01/02/17 1118      PT LONG TERM GOAL #1   Title  Patient to demonstrate an improvement of at least 1 MMT grade in all tested groups in order to improve gait pattern  and balance    Time  8    Period  Weeks    Status  Partially Met      PT LONG TERM GOAL #2   Title  Patient to be able to complete TUG test in 14 seconds or less with LRAD in order to show improved mobility and balance     Time  8    Period  Weeks    Status  On-going      PT LONG TERM GOAL #3   Title  Patient to demonstrate at least a 50% improvement in muscle flexibility deficits in order to reduce pain and improve mechanics     Time  8    Period  Weeks    Status  On-going      PT LONG TERM GOAL #4   Title  Patient and spouse to be able to verbally explain the benefits of PWR/HIIT training  for Parkinsons disease, and will be participatory in PWR based exercise program to assist in maintaining flexibility, trunk/hip mobility, and prevent worsening of back pain due to Parkinsonian changes     Time  8    Period  Weeks    Status  On-going            Plan - 01/23/17 1128    Clinical  Impression Statement  Contiued with focus on improving posture and lumbar mobility while reducing pain.  PT with constant cues to keep seated upright as tends to present with forward posture and leaning to the Rt .  Pt with more complaints in Lt hip/glute region this session stating his Rt is much better since we have been working on it.  Worked in Lt side this session with patient in [place].  Pt noted much mimprovment at EOS and walkiing better.  also worked on tight scapular musculature, Lt >Rt with good results.      Rehab Potential  Good    Clinical Impairments Affecting Rehab Potential  (+) very motivated, supportive family; (-) chronicity of pain, parkinsons    PT Frequency  2x / week    PT Duration  4 weeks    PT Treatment/Interventions  ADLs/Self Care Home Management;Biofeedback;Cryotherapy;Moist Heat;DME Instruction;Gait training;Stair training;Functional mobility training;Therapeutic activities;Therapeutic exercise;Balance training;Neuromuscular re-education;Patient/family education;Manual techniques;Passive range of motion;Dry needling;Energy conservation;Taping    PT Next Visit Plan  Continue to address posture and spinal mobiltiy.   Manual to address restrictions as well.      PT Home Exercise Plan  Eval: 3D hip excursions, starfish stretch, HS stretch 10/30: SKTC, lumbar rotation        Patient will benefit from skilled therapeutic intervention in order to improve the following deficits and impairments:  Abnormal gait, Improper body mechanics, Pain, Decreased coordination, Decreased mobility, Postural dysfunction, Decreased range of motion, Decreased strength, Hypomobility, Impaired UE functional use, Decreased balance, Difficulty walking, Impaired flexibility  Visit Diagnosis: Chronic bilateral low back pain without sciatica  Abnormal posture  Pain in right hip     Problem List Patient Active Problem List   Diagnosis Date Noted  . S/P ERCP   . Biliary obstruction   .  Pancreatitis, acute   . Choledocholithiasis   . Total bilirubin, elevated   . Cholelithiasis   . Elevated LFTs   . Pancreatitis 09/02/2015  . Parkinson disease ([place])   . Diabetes mellitus without complication ([place])   . Hypertension   . Essential hypertension   . Jaundice   . Chronic low back pain 03/24/2015  . OSA (obstructive sleep apnea) 04/10/2014  . Paralysis agitans ([place]) 10/07/2012   Elijah [name], PTA/CLT [phone]  Elijah [name] 01/23/2017, 11:32 AM  [facility] [address] [place], [place], [address] Phone: [phone]   Fax:  [phone]  Name: Elijah [name] MRN: [id number] Date of Birth: March 22, 1940

## 2017-01-25 ENCOUNTER — Ambulatory Visit (HOSPITAL_COMMUNITY): Payer: PPO

## 2017-01-25 ENCOUNTER — Encounter (HOSPITAL_COMMUNITY): Payer: Self-pay

## 2017-01-25 DIAGNOSIS — M25551 Pain in right hip: Secondary | ICD-10-CM

## 2017-01-25 DIAGNOSIS — M545 Low back pain: Principal | ICD-10-CM

## 2017-01-25 DIAGNOSIS — R262 Difficulty in walking, not elsewhere classified: Secondary | ICD-10-CM

## 2017-01-25 DIAGNOSIS — R293 Abnormal posture: Secondary | ICD-10-CM

## 2017-01-25 DIAGNOSIS — G8929 Other chronic pain: Secondary | ICD-10-CM

## 2017-01-25 DIAGNOSIS — M6281 Muscle weakness (generalized): Secondary | ICD-10-CM

## 2017-01-25 DIAGNOSIS — R29898 Other symptoms and signs involving the musculoskeletal system: Secondary | ICD-10-CM

## 2017-01-25 NOTE — Therapy (Signed)
[place], [place], [address] Phone: [phone]   Fax:  [phone]  Physical Therapy Treatment  Patient Details  Name: Elijah [name] MRN: [id number] Date of Birth: 05-16-1940 Referring Provider: Celene Squibb    Encounter Date: 01/25/2017  PT End of Session - 01/25/17 1047    Visit Number  13    Number of Visits  17    Date for PT Re-Evaluation  01/30/17    Authorization Type  [employer]     Authorization Time Period  12/07/16 to 02/06/17; gcodes done visit 8    Authorization - Visit Number  13    Authorization - Number of Visits  18    PT Start Time  1035    PT Stop Time  1118    PT Time Calculation (min)  43 min    Activity Tolerance  Patient tolerated treatment well    Behavior During Therapy  [facility] for tasks assessed/performed       Past Medical History:  Diagnosis Date  . Chronic low back pain 03/24/2015  . Degenerative arthritis   . Diabetes mellitus without complication ([place])   . Dyslipidemia   . Glaucoma   . Gout 10/07/2012  . Hypertension   . Low back pain   . Obesity   . OSA (obstructive sleep apnea) 04/10/2014  . Pancreatitis   . Parkinson disease ([place])   . Rotator cuff tear    Bilateral, no surgery    Past Surgical History:  Procedure Laterality Date  . APPENDECTOMY    . BALLOON DILATION N/A 09/06/2015   Procedure: BALLOON DILATION;  Surgeon: Daneil Dolin, MD;  [facility];  Service: Endoscopy;  Laterality: N/A;  . BILIARY STENT PLACEMENT N/A 09/08/2015   Procedure: BILIARY STENT PLACEMENT;  Surgeon: Daneil Dolin, MD;  [facility];  Service: Endoscopy;  Laterality: N/A;  . ERCP N/A 09/06/2015   Procedure: ENDOSCOPIC RETROGRADE CHOLANGIOPANCREATOGRAPHY (ERCP);  Surgeon: Daneil Dolin, MD;  [facility];  Service: Endoscopy;  Laterality: N/A;  . ERCP N/A 09/08/2015   Procedure: ENDOSCOPIC RETROGRADE CHOLANGIOPANCREATOGRAPHY (ERCP);  Surgeon:  Daneil Dolin, MD;  [facility];  Service: Endoscopy;  Laterality: N/A;  with bile duct brushings  . HERNIA REPAIR    . SHOULDER BONE SPUR Left    RESECTION  . SPHINCTEROTOMY N/A 09/06/2015   Procedure: SPHINCTEROTOMY;  Surgeon: Daneil Dolin, MD;  [facility];  Service: Endoscopy;  Laterality: N/A;  . TONSILLECTOMY      There were no vitals filed for this visit.  Subjective Assessment - 01/25/17 1040    Subjective  Pt stated he spent 4 hours blowing leaves on Friday, lower back and hips are tight/stiff today.      Patient Stated Goals  sit up and walk straight, get back to doing things I'd like to do     Currently in Pain?  Yes    Pain Score  3     Pain Location  Back    Pain Orientation  Lower;Left    Pain Descriptors / Indicators  Tightness;Aching    Pain Type  Chronic pain    Pain Onset  More than a month ago    Pain Frequency  Constant    Aggravating Factors   being in one position too long    Pain Relieving Factors  heat    Effect of Pain on Daily Activities  moderate                      [facility] Adult PT Treatment/Exercise - 01/25/17 0001      Lumbar Exercises: Aerobic   Stationary Bike  3' backwards L1      Lumbar Exercises: Standing   Row  15 reps;Theraband    Theraband Level (Row)  Level 2 (Red)    Shoulder Extension  15 reps;Theraband    Theraband Level (Shoulder Extension)  Level 2 (Red)    Other Standing Lumbar Exercises  3D hip excursion 10x (no extension)      Lumbar Exercises: Seated   Other Seated Lumbar Exercises  wback for posture strengthening      Lumbar Exercises: Supine   Other Supine Lumbar Exercises  stargazer stretch x 2 min    Other Supine Lumbar Exercises  cervical then scapular retraction 10x 3"      Lumbar Exercises: Sidelying   Other Sidelying Lumbar Exercises  1/2 bolster with Lt sidelying and manual to QL and TFL      Manual Therapy   Manual Therapy  Soft tissue mobilization    Manual therapy  comments  separate from all otehr skilled services     Soft tissue mobilization  1/2 bolster with Lt sidelying and manual to QL and TFL               PT Short Term Goals - 01/02/17 1118      PT SHORT TERM GOAL #1   Title  Patient to experience pain as being no more than 2/10 at worst in order to improve QOL and improve functional task performance     Time  4    Period  Weeks    Status  On-going      PT SHORT TERM GOAL #2   Title  Patient to be able to [facility] correct posture at least 50% of the time without external cues, patient and spouse also to be able to demonstrate placement of pads/pillows to assist with postural correction with cues no more than 20% of the time     Time  4    Period  Weeks    Status  On-going      PT SHORT TERM GOAL #3   Title  Patient to be independent with correct supine to sit/sit to supine mechanics in order to reduce stress on lumbar spine with functional mobility     Time  4    Status  On-going      PT SHORT TERM GOAL #4   Title  Patient to be compliant with correct performance of HEP, to be updated as appropriate     Time  1    Period  Weeks    Status  Achieved        PT Long Term Goals - 01/02/17 1118      PT LONG TERM GOAL #1   Title  Patient to demonstrate an improvement of at least 1 MMT grade in all tested groups in order to improve gait pattern  and balance    Time  8    Period  Weeks    Status  Partially Met      PT LONG TERM GOAL #2   Title  Patient to be able to complete TUG test in 14 seconds or less with LRAD in order to show improved mobility and balance     Time  8    Period  Weeks  Status  On-going      PT LONG TERM GOAL #3   Title  Patient to demonstrate at least a 50% improvement in muscle flexibility deficits in order to reduce pain and improve mechanics     Time  8    Period  Weeks    Status  On-going      PT LONG TERM GOAL #4   Title  Patient and spouse to be able to verbally explain the benefits of  PWR/HIIT training for Parkinsons disease, and will be participatory in PWR based exercise program to assist in maintaining flexibility, trunk/hip mobility, and prevent worsening of back pain due to Parkinsonian changes     Time  8    Period  Weeks    Status  On-going            Plan - 01/25/17 1049    Clinical Impression Statement  Continued session focus addressing spinal mobility and posture strengthening for pain control.  Pt continues to present with forward rolled posture though is leaning less to the Rt.  Reports he feels he sits up taller while eating dinner.  Added modified wback and backwards UBE for posture strengthening.  EOS with manual to address tight restrictions in Lt QL, TFL and ITB with reports of relief following.  No reports of pain at EOS.      Rehab Potential  Good    Clinical Impairments Affecting Rehab Potential  (+) very motivated, supportive family; (-) chronicity of pain, parkinsons    PT Frequency  2x / week    PT Duration  4 weeks    PT Treatment/Interventions  ADLs/Self Care Home Management;Biofeedback;Cryotherapy;Moist Heat;DME Instruction;Gait training;Stair training;Functional mobility training;Therapeutic activities;Therapeutic exercise;Balance training;Neuromuscular re-education;Patient/family education;Manual techniques;Passive range of motion;Dry needling;Energy conservation;Taping    PT Next Visit Plan  Continue to address posture and spinal mobiltiy.   Manual to address restrictions as well.      PT Home Exercise Plan  Eval: 3D hip excursions, starfish stretch, HS stretch 10/30: SKTC, lumbar rotation        Patient will benefit from skilled therapeutic intervention in order to improve the following deficits and impairments:  Abnormal gait, Improper body mechanics, Pain, Decreased coordination, Decreased mobility, Postural dysfunction, Decreased range of motion, Decreased strength, Hypomobility, Impaired UE functional use, Decreased balance, Difficulty  walking, Impaired flexibility  Visit Diagnosis: Chronic bilateral low back pain without sciatica  Abnormal posture  Pain in right hip  Difficulty in walking, not elsewhere classified  Other symptoms and signs involving the musculoskeletal system  Muscle weakness (generalized)     Problem List Patient Active Problem List   Diagnosis Date Noted  . S/P ERCP   . Biliary obstruction   . Pancreatitis, acute   . Choledocholithiasis   . Total bilirubin, elevated   . Cholelithiasis   . Elevated LFTs   . Pancreatitis 09/02/2015  . Parkinson disease ([place])   . Diabetes mellitus without complication ([place])   . Hypertension   . Essential hypertension   . Jaundice   . Chronic low back pain 03/24/2015  . OSA (obstructive sleep apnea) 04/10/2014  . Paralysis agitans [facility]) 10/07/2012   Ihor Austin, [place]; [place]  Aldona Lento 01/25/2017, 12:10 PM  [place], [place], [address] Phone: [phone]   Fax:  [phone]  Name: OLAN KUREK MRN: [id number] Date of Birth: March 11, 1940

## 2017-01-30 ENCOUNTER — Ambulatory Visit (HOSPITAL_COMMUNITY): Payer: PPO

## 2017-01-30 ENCOUNTER — Telehealth (HOSPITAL_COMMUNITY): Payer: Self-pay | Admitting: Internal Medicine

## 2017-01-30 NOTE — Telephone Encounter (Signed)
01/30/17  wife called and said that he wouldn't be here today

## 2017-02-01 ENCOUNTER — Ambulatory Visit (HOSPITAL_COMMUNITY): Payer: PPO | Admitting: Physical Therapy

## 2017-02-01 ENCOUNTER — Telehealth (HOSPITAL_COMMUNITY): Payer: Self-pay | Admitting: Internal Medicine

## 2017-02-01 NOTE — Telephone Encounter (Signed)
02/01/17  wife left a message to cx today, said that he has had an upset stomach for a few days

## 2017-02-06 ENCOUNTER — Ambulatory Visit (HOSPITAL_COMMUNITY): Payer: PPO

## 2017-02-06 ENCOUNTER — Encounter (HOSPITAL_COMMUNITY): Payer: Self-pay

## 2017-02-06 DIAGNOSIS — M545 Low back pain: Principal | ICD-10-CM

## 2017-02-06 DIAGNOSIS — R29898 Other symptoms and signs involving the musculoskeletal system: Secondary | ICD-10-CM

## 2017-02-06 DIAGNOSIS — R262 Difficulty in walking, not elsewhere classified: Secondary | ICD-10-CM

## 2017-02-06 DIAGNOSIS — M6281 Muscle weakness (generalized): Secondary | ICD-10-CM

## 2017-02-06 DIAGNOSIS — M25551 Pain in right hip: Secondary | ICD-10-CM

## 2017-02-06 DIAGNOSIS — G8929 Other chronic pain: Secondary | ICD-10-CM

## 2017-02-06 DIAGNOSIS — R293 Abnormal posture: Secondary | ICD-10-CM

## 2017-02-06 NOTE — Patient Instructions (Signed)
  ELASTIC BAND ROWS Holding elastic band with both hands, draw back the band as you bend your elbows. Keep your elbows near the side of your body Repeat 10 Times Hold 5 Seconds Complete 1 Set Perform 1 Time(s) a Day   ELASTIC BAND EXTENSION BILATERAL SHOULDER While holding an elastic band with both arms in front of you with your elbows straight, pull the band downwards and back towards your side. Repeat 10 Times Hold 3 Seconds Complete 1 Set Perform 1 Time(s) a Day

## 2017-02-06 NOTE — Therapy (Addendum)
[place], [place], [address] Phone: [phone]   Fax:  [phone]  Physical Therapy Treatment / Discharge  Patient Details  Name: Elijah [name] MRN: [id number] Date of Birth: Feb 14, 1941 Referring Provider: Celene Squibb    Encounter Date: 02/06/2017  PT End of Session - 02/06/17 1116    Visit Number  14    Number of Visits  17    Date for PT Re-Evaluation  01/30/17    Authorization Type  [employer]     Authorization Time Period  12/07/16 to 02/06/17; gcodes done visit 8    Authorization - Visit Number  14    Authorization - Number of Visits  18    PT Start Time  1033    PT Stop Time  1115    PT Time Calculation (min)  42 min    Activity Tolerance  Patient tolerated treatment well    Behavior During Therapy  [facility] for tasks assessed/performed       Past Medical History:  Diagnosis Date  . Chronic low back pain 03/24/2015  . Degenerative arthritis   . Diabetes mellitus without complication ([place])   . Dyslipidemia   . Glaucoma   . Gout 10/07/2012  . Hypertension   . Low back pain   . Obesity   . OSA (obstructive sleep apnea) 04/10/2014  . Pancreatitis   . Parkinson disease ([place])   . Rotator cuff tear    Bilateral, no surgery    Past Surgical History:  Procedure Laterality Date  . APPENDECTOMY    . BALLOON DILATION N/A 09/06/2015   Procedure: BALLOON DILATION;  Surgeon: Daneil Dolin, MD;  [facility];  Service: Endoscopy;  Laterality: N/A;  . BILIARY STENT PLACEMENT N/A 09/08/2015   Procedure: BILIARY STENT PLACEMENT;  Surgeon: Daneil Dolin, MD;  [facility];  Service: Endoscopy;  Laterality: N/A;  . ERCP N/A 09/06/2015   Procedure: ENDOSCOPIC RETROGRADE CHOLANGIOPANCREATOGRAPHY (ERCP);  Surgeon: Daneil Dolin, MD;  [facility];  Service: Endoscopy;  Laterality: N/A;  . ERCP N/A 09/08/2015   Procedure: ENDOSCOPIC RETROGRADE CHOLANGIOPANCREATOGRAPHY  (ERCP);  Surgeon: Daneil Dolin, MD;  [facility];  Service: Endoscopy;  Laterality: N/A;  with bile duct brushings  . HERNIA REPAIR    . SHOULDER BONE SPUR Left    RESECTION  . SPHINCTEROTOMY N/A 09/06/2015   Procedure: SPHINCTEROTOMY;  Surgeon: Daneil Dolin, MD;  [facility];  Service: Endoscopy;  Laterality: N/A;  . TONSILLECTOMY      There were no vitals filed for this visit.  Subjective Assessment - 02/06/17 1036    Subjective  Pt reports using his walker to go out to his shop in the snow approximately 200 Ft. each eay. He reports he has noticed that he has gotten better with his posture and the soreness has decreased since initially during PT. Pt rates himself about 80% better since starting OPPT.     Currently in Pain?  Yes    Pain Score  1     Pain Location  Back         [facility] PT Assessment - 02/06/17 0001      AROM   Lumbar Flexion  WFL     Lumbar Extension  mild limitation     Lumbar - Right Side Bend  [facility]     Lumbar - Left Side [facility]  Strength   Right Hip Flexion  5/5    Right Hip ABduction  4+/5    Left Hip ABduction  4+/5    Right Knee Extension  5/5    Left Knee Extension  5/5      Flexibility   Hamstrings  minimal limitation     Piriformis  severe limitation Lt > Rt.                   [facility] Adult PT Treatment/Exercise - 02/06/17 0001      Lumbar Exercises: Standing   Row  15 reps;Theraband    Theraband Level (Row)  Level 2 (Red)    Shoulder Extension  15 reps;Theraband    Theraband Level (Shoulder Extension)  Level 2 (Red)    Other Standing Lumbar Exercises  3D hip excursion 10x (no extension)      Lumbar Exercises: Seated   Other Seated Lumbar Exercises  wback x 10 for posture strengthening    Other Seated Lumbar Exercises  seated side bend and rotation stretch with arms in "w" x 20 each      Lumbar Exercises: Supine   Other Supine Lumbar Exercises  cervical then scapular retraction 10x 3"              PT Education - 02/06/17 1115    Education provided  Yes    Education Details  Patient educated on importance of continuing to complete HEP, HEP was updated to include postural strengthening, pt educated to monitor symptoms to continue to manage PD symptoms and avoid exacerbation.     Person(s) Educated  Patient    Methods  Explanation;Handout    Comprehension  Verbalized understanding       PT Short Term Goals - 02/06/17 1042      PT SHORT TERM GOAL #1   Title  Patient to experience pain as being no more than 2/10 at worst in order to improve QOL and improve functional task performance     Baseline  12/18: Today pain rating 1/10; most days reports mot 2/10    Time  4    Period  Weeks    Status  Achieved      PT SHORT TERM GOAL #2   Title  Patient to be able to [facility] correct posture at least 50% of the time without external cues, patient and spouse also to be able to demonstrate placement of pads/pillows to assist with postural correction with cues no more than 20% of the time     Baseline  12/18: patient verbalizes awareness of posture and reports monitoring while brushing his teeth and during other ADLs; continues to present with postural impairments     Time  4    Period  Weeks    Status  Partially Met      PT SHORT TERM GOAL #3   Title  Patient to be independent with correct supine to sit/sit to supine mechanics in order to reduce stress on lumbar spine with functional mobility     Baseline  12/18: transitioning supine to sit and sit to supine with improved body mechanics transitioning through side-lying     Time  4    Status  Achieved      PT SHORT TERM GOAL #4   Title  Patient to be compliant with correct performance of HEP, to be updated as appropriate     Baseline  12/18: reports completing daily     Time  1    Period  Weeks    Status  Achieved        PT Long Term Goals - 02/06/17 1043      PT LONG TERM GOAL #1   Title  Patient to demonstrate an  improvement of at least 1 MMT grade in all tested groups in order to improve gait pattern  and balance    Baseline  12/18: hip flexion, knee extension bilaterally 5/5 progression form 3 and 4+/5; hip abduction improvement Lt 3 to 4+, Rt remained the same.     Time  8    Period  Weeks    Status  Partially Met      PT LONG TERM GOAL #2   Title  Patient to be able to complete TUG test in 14 seconds or less with LRAD in order to show improved mobility and balance     Baseline  12/18: pt ambulates with SPC in community and PT sessions, therapist did not test this session     Time  8    Period  Weeks    Status  On-going      PT LONG TERM GOAL #3   Title  Patient to demonstrate at least a 50% improvement in muscle flexibility deficits in order to reduce pain and improve mechanics     Baseline  12/18: Patient had >50% improvement hamstring flexibility bilaterally; piriformis remains significantly limited     Time  8    Period  Weeks    Status  Partially Met      PT LONG TERM GOAL #4   Title  Patient and spouse to be able to verbally explain the benefits of PWR/HIIT training for Parkinsons disease, and will be participatory in PWR based exercise program to assist in maintaining flexibility, trunk/hip mobility, and prevent worsening of back pain due to Parkinsonian changes     Baseline  12/18: pt notes that he knows it's to help with his posture and get around better; he verbalizes completeing HEP focused on PWR to help when he is feeling stiff     Time  8    Period  Weeks    Status  Achieved            Plan - 02/06/17 1122    Clinical Impression Statement  Re-Assessment and discharge completed this visit. Patient has made great improvements in overall strength of bilateral LEs by 1 MMT grade at least and presents with improved hamstring flexibility. Piriformis flexibility remains significantly limited bilaterally with left worse than right and postural awareness has increased; however,  patient continues to have increased difficulty obtaining and maintaining an upright position to decrease irritation of lumbar spine. Patient verbalized today that he was happy with progress made thus far and would like to discharge to HEP, with awareness of his ability to come back if he feels he needs to. Patient ambulates primarily with a SPC currently and is able to ambulate 8 feet without AD, no loss of balance. He continues to maintain an active lifestyle and reports doing his HEP throughout the day. Pt is discharged at this time and will follow up with MD as needed.     Rehab Potential  Good    Clinical Impairments Affecting Rehab Potential  (+) very motivated, supportive family; (-) chronicity of pain, parkinsons    PT Frequency  2x / week    PT Duration  4 weeks    PT Treatment/Interventions  ADLs/Self Care Home Management;Biofeedback;Cryotherapy;Moist Heat;DME Instruction;Gait training;Stair training;Functional mobility training;Therapeutic activities;Therapeutic  exercise;Balance training;Neuromuscular re-education;Patient/family education;Manual techniques;Passive range of motion;Dry needling;Energy conservation;Taping    PT Next Visit Plan  Discharged    PT Home Exercise Plan  Eval: 3D hip excursions, starfish stretch, HS stretch 10/30: SKTC, lumbar rotation        Patient will benefit from skilled therapeutic intervention in order to improve the following deficits and impairments:  Abnormal gait, Improper body mechanics, Pain, Decreased coordination, Decreased mobility, Postural dysfunction, Decreased range of motion, Decreased strength, Hypomobility, Impaired UE functional use, Decreased balance, Difficulty walking, Impaired flexibility  Visit Diagnosis: Chronic bilateral low back pain without sciatica  Abnormal posture  Pain in right hip  Difficulty in walking, not elsewhere classified  Other symptoms and signs involving the musculoskeletal system  Muscle weakness  (generalized)   PHYSICAL THERAPY DISCHARGE SUMMARY  Visits from Start of Care: 14 completed  Current functional level related to goals / functional outcomes: Patient has made good improvements in strength and hamstring flexibility at this time. He ambulates with a SPC primarily and utilizes walker weather permitting for safety. Pt notes he has become more aware of his posture and his back does not have "the soreness" it did at the start of therapy.    Remaining deficits: Piriformis flexibility, postural alignment/lumbar ROM   Education / Equipment: HEP and pt education regarding PD diagnosis, preventative education Plan: Patient agrees to discharge.  Patient goals were partially met. Patient is being discharged due to meeting the stated rehab goals.  ?????      G-Codes - 02/21/2017 1132    Functional Assessment Tool Used (Outpatient Only)  Based on skilled clinical assessment of posture, strength, gait, balance, flexibility, mobility     Functional Limitation  Mobility: Walking and moving around    Mobility: Walking and Moving Around Current Status [phone])  At least 20 percent but less than 40 percent impaired, limited or restricted    Mobility: Walking and Moving Around Goal Status [phone])  At least 20 percent but less than 40 percent impaired, limited or restricted    Mobility: Walking and Moving Around Discharge Status [phone])  At least 20 percent but less than 40 percent impaired, limited or restricted       Problem List Patient Active Problem List   Diagnosis Date Noted  . S/P ERCP   . Biliary obstruction   . Pancreatitis, acute   . Choledocholithiasis   . Total bilirubin, elevated   . Cholelithiasis   . Elevated LFTs   . Pancreatitis 09/02/2015  . Parkinson disease ([place])   . Diabetes mellitus without complication ([place])   . Hypertension   . Essential hypertension   . Jaundice   . Chronic low back pain 03/24/2015  . OSA (obstructive sleep apnea) 04/10/2014  .  Paralysis agitans ([place]) 10/07/2012   Starr [place] PT, DPT 11:32 AM, Feb 21, 2017 [place] [address] [place], [place], [address] Phone: [phone]   Fax:  [phone]  Name: Elijah [name] MRN: [id number] Date of Birth: 1940-11-29

## 2017-02-08 ENCOUNTER — Encounter (HOSPITAL_COMMUNITY): Payer: PPO

## 2017-02-12 ENCOUNTER — Encounter (HOSPITAL_COMMUNITY): Payer: PPO | Admitting: Physical Therapy

## 2017-02-15 ENCOUNTER — Ambulatory Visit (HOSPITAL_COMMUNITY): Payer: PPO

## 2017-02-15 ENCOUNTER — Encounter (HOSPITAL_COMMUNITY): Payer: PPO

## 2017-03-07 DIAGNOSIS — M545 Low back pain: Secondary | ICD-10-CM | POA: Diagnosis not present

## 2017-04-10 DIAGNOSIS — E119 Type 2 diabetes mellitus without complications: Secondary | ICD-10-CM | POA: Diagnosis not present

## 2017-04-10 DIAGNOSIS — H47333 Pseudopapilledema of optic disc, bilateral: Secondary | ICD-10-CM | POA: Diagnosis not present

## 2017-04-10 DIAGNOSIS — Z961 Presence of intraocular lens: Secondary | ICD-10-CM | POA: Diagnosis not present

## 2017-04-10 DIAGNOSIS — H4720 Unspecified optic atrophy: Secondary | ICD-10-CM | POA: Diagnosis not present

## 2017-04-23 ENCOUNTER — Ambulatory Visit: Payer: PPO | Admitting: Neurology

## 2017-04-23 ENCOUNTER — Encounter: Payer: Self-pay | Admitting: Neurology

## 2017-04-23 VITALS — BP 151/108 | HR 68 | Ht 70.0 in | Wt 216.5 lb

## 2017-04-23 DIAGNOSIS — G20 Parkinson's disease: Secondary | ICD-10-CM | POA: Diagnosis not present

## 2017-04-23 NOTE — Progress Notes (Signed)
Reason for visit: Parkinson's disease  Elijah [name] is an 77 y.o. male  History of present illness:  [name] is a 77 year old left-handed white male with a history of Parkinson's disease.  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  The patient also reports some slight numbness in the lower abdomen and flank.  The patient has not had any falls, he uses a cane or a walker to get around, he feels better with his back if he is able to use a walker walking long distances.  The patient has undergone physical therapy up until December 2018.  He is doing back exercises.  He is trying to stay active.  He does have some freezing events that tend to occur later on the afternoon or evening.  He denies any definite end dose effect with the freezing.  He returns to the office today for an evaluation.  Past Medical History:  Diagnosis Date  . Chronic low back pain 03/24/2015  . Degenerative arthritis   . Diabetes mellitus without complication ([place])   . Dyslipidemia   . Glaucoma   . Gout 10/07/2012  . Hypertension   . Low back pain   . Obesity   . OSA (obstructive sleep apnea) 04/10/2014  . Pancreatitis   . Parkinson disease ([place])   . Rotator cuff tear    Bilateral, no surgery    Past Surgical History:  Procedure Laterality Date  . APPENDECTOMY    . BALLOON DILATION N/A 09/06/2015   Procedure: BALLOON DILATION;  Surgeon: Daneil Dolin, MD;  [facility];  Service: Endoscopy;  Laterality: N/A;  . BILIARY STENT PLACEMENT N/A 09/08/2015   Procedure: BILIARY STENT PLACEMENT;  Surgeon: Daneil Dolin, MD;  [facility];  Service: Endoscopy;  Laterality: N/A;  . ERCP N/A 09/06/2015   Procedure: ENDOSCOPIC RETROGRADE CHOLANGIOPANCREATOGRAPHY (ERCP);  Surgeon: Daneil Dolin, MD;  [facility];  Service: Endoscopy;  Laterality: N/A;  . ERCP N/A 09/08/2015   Procedure: ENDOSCOPIC RETROGRADE CHOLANGIOPANCREATOGRAPHY (ERCP);  Surgeon: Daneil Dolin, MD;  [facility];  Service: Endoscopy;  Laterality: N/A;  with bile duct brushings  . HERNIA REPAIR    . SHOULDER BONE SPUR Left    RESECTION  . SPHINCTEROTOMY N/A 09/06/2015   Procedure: SPHINCTEROTOMY;  Surgeon: Daneil Dolin, MD;  [facility];  Service: Endoscopy;  Laterality: N/A;  . TONSILLECTOMY      Family History  Problem Relation Age of Onset  . Stroke Mother   . Diabetes Mother   . Heart Problems Mother   . Parkinsonism Father   . Heart Problems Father     Social history:  reports that  has never smoked. he has never used smokeless tobacco. He reports that he does not drink alcohol or use drugs.    Allergies  Allergen Reactions  . Aspirin Other (See Comments)    Stomach ulcers.  . Coconut Oil     Other reaction(s): GI Upset (intolerance)  . Penicillins Rash    Has patient had a PCN reaction causing immediate rash, facial/tongue/throat swelling, SOB or lightheadedness with hypotension: no Has patient had a PCN reaction causing severe rash involving mucus membranes or skin necrosis: No Has patient had a PCN reaction that required hospitalization No Has patient had a PCN reaction occurring within the last 10 years: No  If all of the above answers are "NO", then may proceed with Cephalosporin use.     Medications:  Prior to Admission medications   Medication Sig Start Date End Date Taking? Authorizing Provider  ACCU-CHEK AVIVA PLUS test strip  12/05/13  Yes [provider]  allopurinol (ZYLOPRIM) 100 MG tablet Take 100 mg by mouth daily.  10/05/12  Yes [provider]  aspirin 81 MG tablet Take 81 mg by mouth daily.   Yes [provider]  Azelastine HCl 0.15 % SOLN Place 1 spray into the nose daily as needed. 08/18/15  Yes [provider]  benzonatate (TESSALON) 200 MG capsule Take 200 mg by mouth 2 (two) times daily.  03/17/16  Yes [provider]  carbidopa-levodopa (SINEMET CR) 50-200 MG tablet TAKE (1) TABLET BY MOUTH (4) TIMES DAILY. 11/07/16  Yes Kathrynn Ducking, MD  cyclobenzaprine (FLEXERIL) 10 MG tablet Take 1 tablet (10 mg total) by mouth 2 (two) times daily as needed for muscle spasms. 07/02/12  Yes Teressa Lower, MD  furosemide (LASIX) 20 MG tablet Take 1 tablet by mouth 2 (two) times daily as needed for fluid or edema.  10/02/13  Yes [provider]  LORazepam (ATIVAN) 1 MG tablet Take 1 mg by mouth as needed for anxiety.   Yes [provider]  meloxicam (MOBIC) 15 MG tablet Take 15 mg by mouth daily as needed for pain.  09/30/12  Yes [provider]  metFORMIN (GLUCOPHAGE) 500 MG tablet Take 500 mg by mouth 2 (two) times daily with a meal.  10/05/12  Yes [provider]  metoprolol tartrate (LOPRESSOR) 25 MG tablet Take 12.5 mg by mouth daily. 1/2 TABLET DAILY   Yes [provider]  pantoprazole (PROTONIX) 40 MG tablet Take 40 mg by mouth daily. 03/08/14  Yes [provider]  potassium chloride SA (K-DUR,KLOR-CON) 20 MEQ tablet Take 1 tablet by mouth daily.  10/24/13  Yes [provider]  pramipexole (MIRAPEX) 1.5 MG tablet Take 1 tablet (1.5 mg total) by mouth 3 (three) times daily. 11/07/16  Yes Kathrynn Ducking, MD  SALINE MIST SPRAY NA Place 1 spray into the nose as needed.    Yes [provider]  selegiline (ELDEPRYL) 5 MG tablet Take 1 tablet (5 mg total) by mouth 2 (two) times daily with a meal. 11/07/16  Yes Kathrynn Ducking, MD  simvastatin (ZOCOR) 40 MG tablet Take 20 mg by mouth at bedtime.  09/05/12  Yes [provider]  valsartan-hydrochlorothiazide (DIOVAN-HCT) 80-12.5 MG per tablet Take 1 tablet by mouth daily.   Yes [provider]    ROS:  Out of a complete 14 system review of symptoms, the patient complains only of the following symptoms, and all other reviewed systems are negative.  Back  pain, aching muscles Snoring  Blood pressure (!) 151/108, pulse 68, height 5\' 10"  (1.778 m), weight 216 lb 8 oz (98.2 kg).  Physical Exam  General: The patient is alert and cooperative at the time of the examination.  Skin: No significant peripheral edema is noted.   Neurologic Exam  Mental status: The patient is alert and oriented x 3 at the time of the examination. The patient has apparent normal recent and remote memory, with an apparently normal attention span and concentration ability.   Cranial nerves: Facial symmetry is present. Speech is normal, no aphasia or dysarthria is noted. Extraocular movements are full. Visual fields are full.  Motor: The patient has good strength in  all 4 extremities, with exception of some weakness of the intrinsic muscles of the right hand and slight weakness with hip flexion on the right.  Atrophy of the right first dorsal interosseous muscle was seen.  Sensory examination: Soft touch sensation is symmetric on the face, arms, and legs.  Coordination: The patient has good finger-nose-finger and heel-to-shin bilaterally.  Gait and station: The patient is able to arise from a seated position with arms crossed.  Once up, the patient is able to walk independently, he leans to the right, tremor is seen on the left upper extremity with walking.  Romberg is negative.  Reflexes: Deep tendon reflexes are symmetric.   Assessment/Plan:  1.  Parkinson's disease  2.  Gait disorder  3.  Chronic low back pain  The patient is stable with his ability to ambulate and function.  We will continue the Mirapex, selegiline, and Sinemet at the current dose.  He will follow-up in 5 months.  He is encouraged to take an early afternoon nap, he is to stay active.  He will call for any concerns.  Jill Alexanders MD 04/23/2017 12:24 PM  [facility] [address] [place], [place] [address]  Phone [phone] Fax [phone]

## 2017-05-14 DIAGNOSIS — E119 Type 2 diabetes mellitus without complications: Secondary | ICD-10-CM | POA: Diagnosis not present

## 2017-05-14 DIAGNOSIS — M545 Low back pain: Secondary | ICD-10-CM | POA: Diagnosis not present

## 2017-05-14 DIAGNOSIS — I10 Essential (primary) hypertension: Secondary | ICD-10-CM | POA: Diagnosis not present

## 2017-05-14 DIAGNOSIS — Z129 Encounter for screening for malignant neoplasm, site unspecified: Secondary | ICD-10-CM | POA: Diagnosis not present

## 2017-05-14 DIAGNOSIS — E782 Mixed hyperlipidemia: Secondary | ICD-10-CM | POA: Diagnosis not present

## 2017-05-16 DIAGNOSIS — M2550 Pain in unspecified joint: Secondary | ICD-10-CM | POA: Diagnosis not present

## 2017-05-16 DIAGNOSIS — K828 Other specified diseases of gallbladder: Secondary | ICD-10-CM | POA: Diagnosis not present

## 2017-05-16 DIAGNOSIS — G20 Parkinson's disease: Secondary | ICD-10-CM | POA: Diagnosis not present

## 2017-05-16 DIAGNOSIS — H401132 Primary open-angle glaucoma, bilateral, moderate stage: Secondary | ICD-10-CM | POA: Diagnosis not present

## 2017-05-16 DIAGNOSIS — M545 Low back pain: Secondary | ICD-10-CM | POA: Diagnosis not present

## 2017-05-16 DIAGNOSIS — E119 Type 2 diabetes mellitus without complications: Secondary | ICD-10-CM | POA: Diagnosis not present

## 2017-05-16 DIAGNOSIS — M1990 Unspecified osteoarthritis, unspecified site: Secondary | ICD-10-CM | POA: Diagnosis not present

## 2017-05-16 DIAGNOSIS — E782 Mixed hyperlipidemia: Secondary | ICD-10-CM | POA: Diagnosis not present

## 2017-05-16 DIAGNOSIS — I10 Essential (primary) hypertension: Secondary | ICD-10-CM | POA: Diagnosis not present

## 2017-05-16 DIAGNOSIS — Z0000 Encounter for general adult medical examination without abnormal findings: Secondary | ICD-10-CM | POA: Diagnosis not present

## 2017-05-30 DIAGNOSIS — Z1212 Encounter for screening for malignant neoplasm of rectum: Secondary | ICD-10-CM | POA: Diagnosis not present

## 2017-05-30 DIAGNOSIS — Z1211 Encounter for screening for malignant neoplasm of colon: Secondary | ICD-10-CM | POA: Diagnosis not present

## 2017-09-12 IMAGING — CT CT RENAL STONE PROTOCOL
2 of 4 series · 16 of 46 positions shown, 18 images · non-contrast
Comparison: CT dated 07/02/2012

CLINICAL DATA: 74-year-old male with right lower quadrant abdominal
pain.

EXAM:
CT ABDOMEN AND PELVIS WITHOUT CONTRAST
TECHNIQUE: Multidetector CT imaging of the abdomen and pelvis was performed
following the standard protocol without IV contrast.

[Series 2: routine abd pel with · axial · 0.88mm/px · z∈[+762,+1167]mm · 13 of 89 slices shown, 15 images]
[im 4/89  soft-tissue]
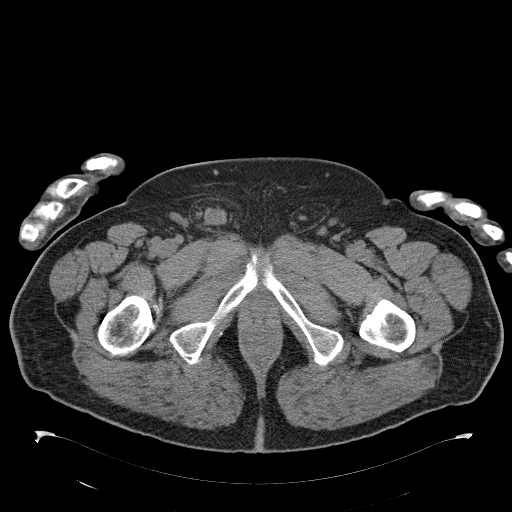
[im 4/89  bone]
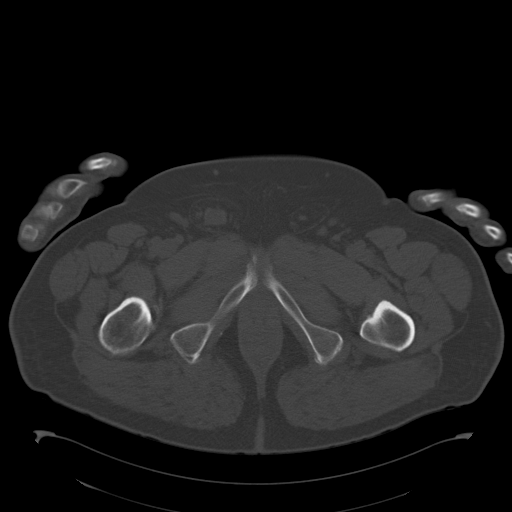
[im 12/89  soft-tissue]
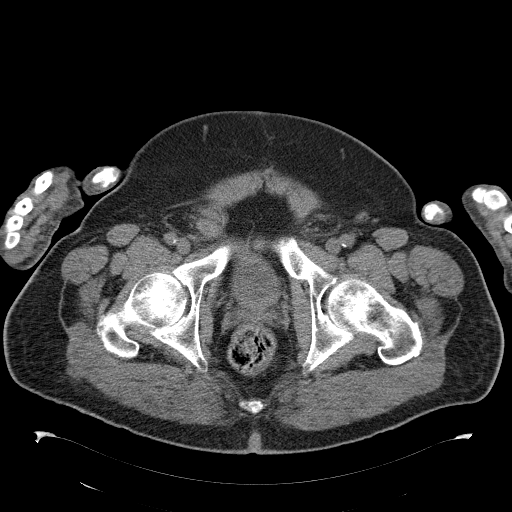
[im 19/89  soft-tissue]
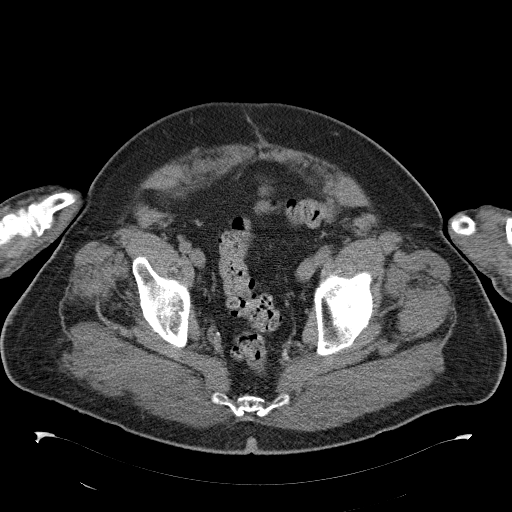
[im 26/89  soft-tissue]
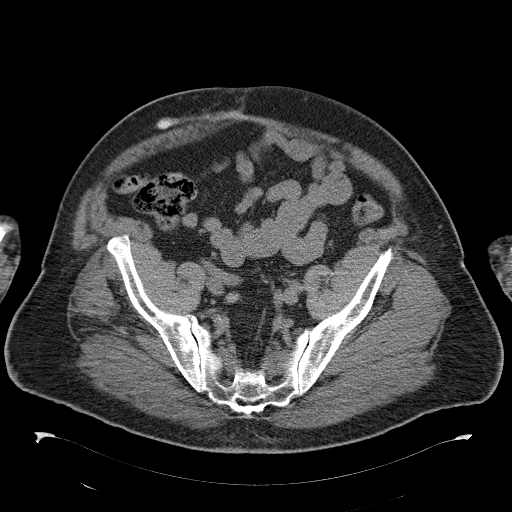
[im 30/89  soft-tissue]
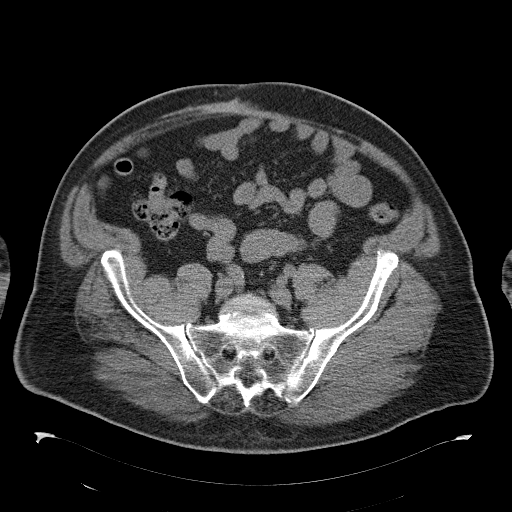
[im 37/89  soft-tissue]
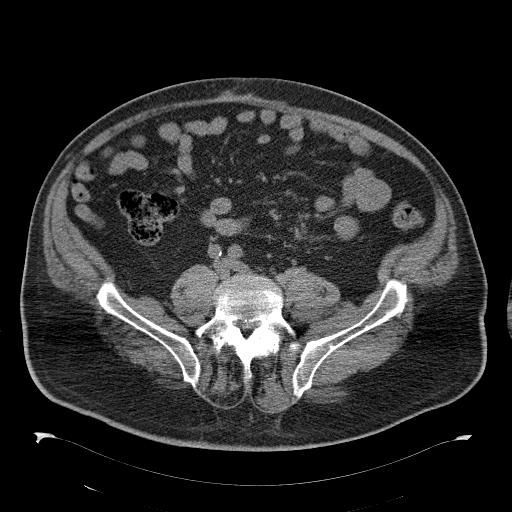
[im 45/89  soft-tissue]
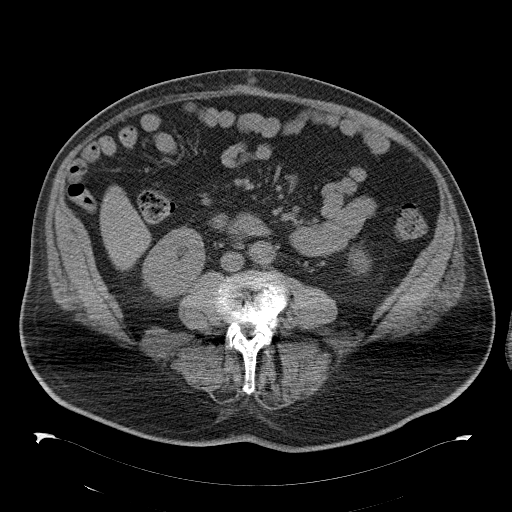
[im 52/89  soft-tissue]
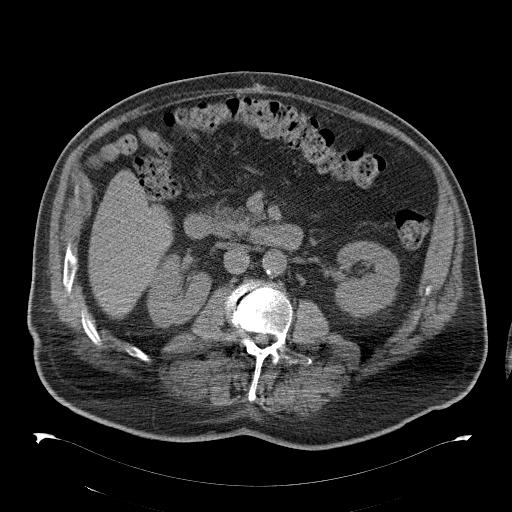
[im 59/89  soft-tissue]
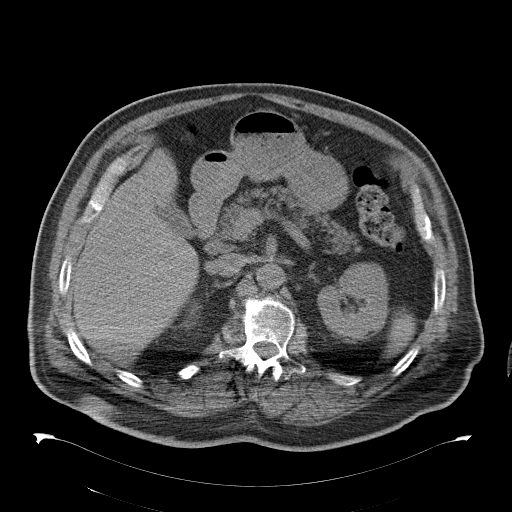
[im 59/89  bone]
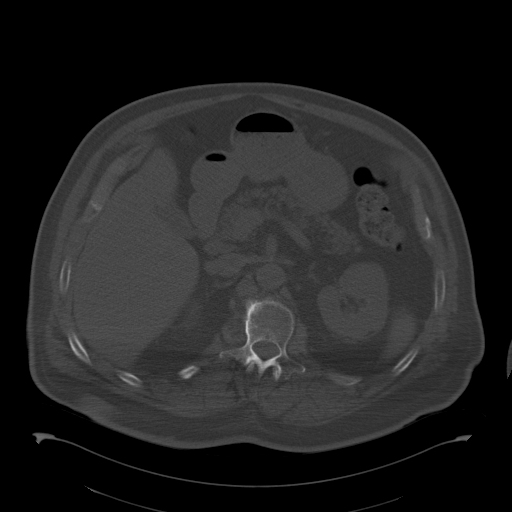
[im 63/89  soft-tissue]
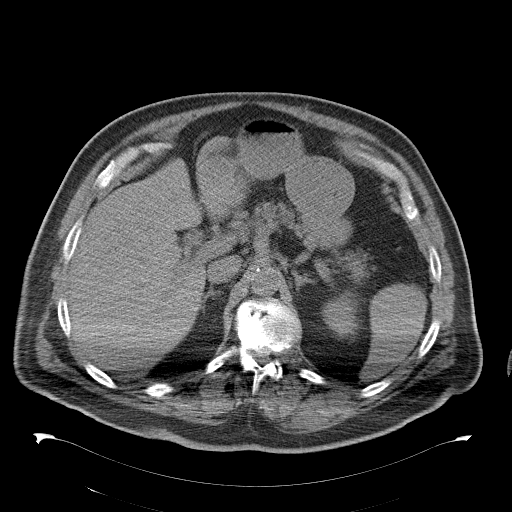
[im 70/89  soft-tissue]
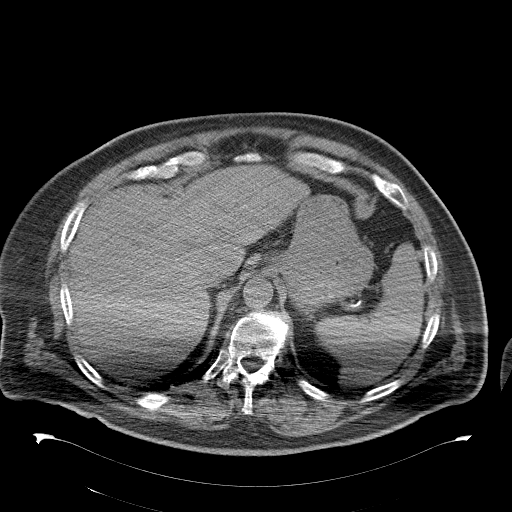
[im 78/89  soft-tissue]
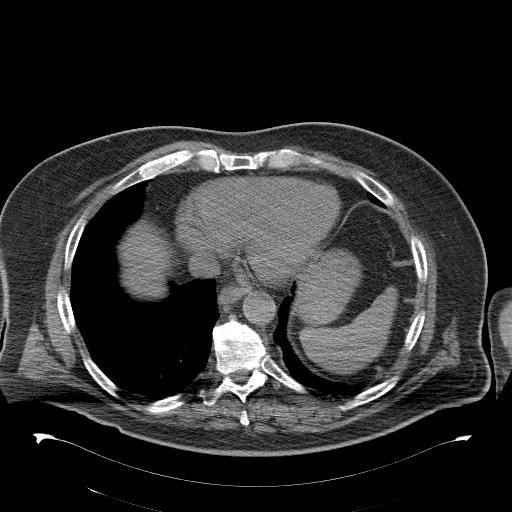
[im 85/89  soft-tissue]
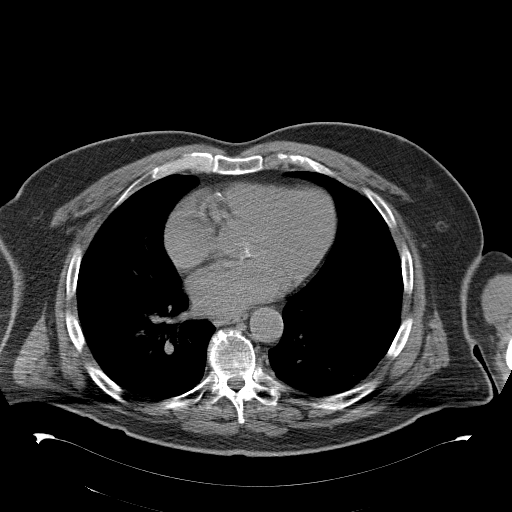

[Series 3: coronal · coronal · 0.82mm/px · 3 of 179 slices shown]
[im 60/179  soft-tissue]
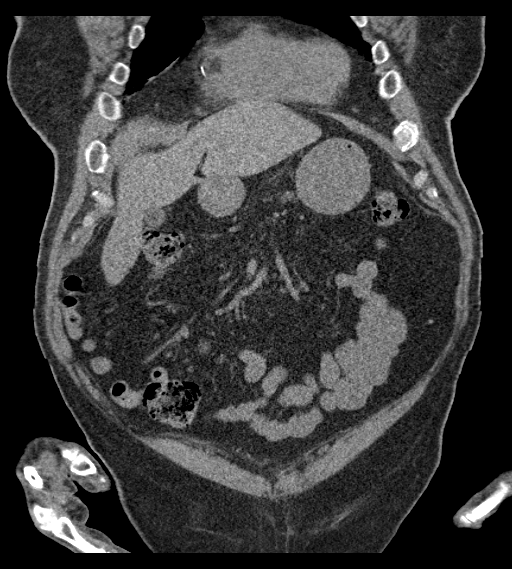
[im 80/179  soft-tissue]
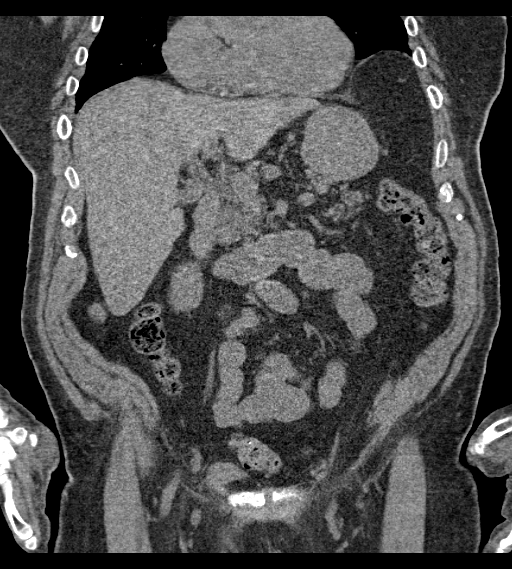
[im 99/179  soft-tissue]
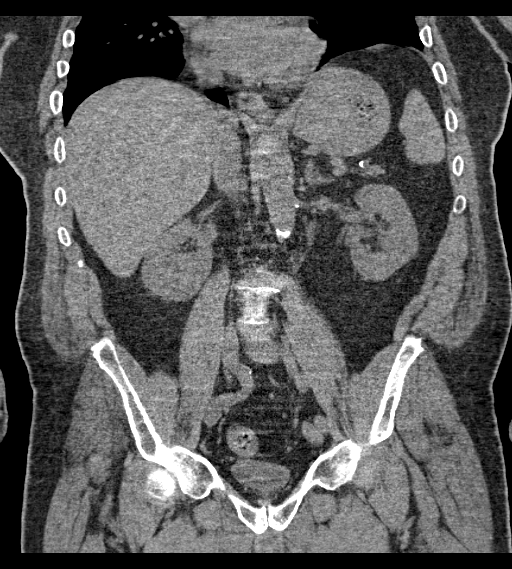

[16 of 46 positions shown; findings below may reference images not displayed]

FINDINGS: Evaluation of this exam is limited in the absence of intravenous
contrast. Evaluation is also limited due to streak artifact caused
by patient's arms.

There is a partially visualized 4 mm nodular density in the right
middle lobe (series 2, image 1). The visualized lung bases are
otherwise clear. There is coronary vascular calcification.

No intra-abdominal free air or free fluid.

The liver, gallbladder, pancreas, spleen, adrenal glands, kidneys,
visualized ureters, and urinary bladder appear unremarkable. The
prostate and seminal vesicles are grossly unremarkable.

Constipation. Doppler sigmoid and scattered colonic diverticula
without active inflammatory changes. There is no evidence of bowel
obstruction or acute inflammation. Appendectomy.

Mild aortoiliac atherosclerotic disease. No portal venous gas
identified. There is no adenopathy. There is a midline vertical
anterior pelvic wall incisional scar. There is a small fat
containing left inguinal hernia. There is a small right inguinal
hernia. There is herniation of a portion of the bladder into the
right inguinal canal. There is degenerative changes of the spine. No
acute fracture.
IMPRESSION: No hydronephrosis or nephrolithiasis.

Constipation.  No bowel obstruction or active inflammation.

Diverticulosis.

## 2017-09-20 ENCOUNTER — Other Ambulatory Visit: Payer: Self-pay | Admitting: Neurology

## 2017-10-16 DIAGNOSIS — M2550 Pain in unspecified joint: Secondary | ICD-10-CM | POA: Diagnosis not present

## 2017-10-16 DIAGNOSIS — Z6841 Body Mass Index (BMI) 40.0 and over, adult: Secondary | ICD-10-CM | POA: Diagnosis not present

## 2017-10-16 DIAGNOSIS — M1990 Unspecified osteoarthritis, unspecified site: Secondary | ICD-10-CM | POA: Diagnosis not present

## 2017-10-16 DIAGNOSIS — M25552 Pain in left hip: Secondary | ICD-10-CM | POA: Diagnosis not present

## 2017-11-01 ENCOUNTER — Ambulatory Visit: Payer: PPO | Admitting: Neurology

## 2017-11-01 ENCOUNTER — Other Ambulatory Visit: Payer: Self-pay

## 2017-11-01 ENCOUNTER — Encounter: Payer: Self-pay | Admitting: Neurology

## 2017-11-01 VITALS — BP 159/65 | HR 62 | Ht 70.0 in | Wt 213.5 lb

## 2017-11-01 DIAGNOSIS — M545 Low back pain: Secondary | ICD-10-CM

## 2017-11-01 DIAGNOSIS — G8929 Other chronic pain: Secondary | ICD-10-CM | POA: Diagnosis not present

## 2017-11-01 DIAGNOSIS — G20 Parkinson's disease: Secondary | ICD-10-CM

## 2017-11-01 DIAGNOSIS — G20A1 Parkinson's disease without dyskinesia, without mention of fluctuations: Secondary | ICD-10-CM

## 2017-11-01 MED ORDER — CARBIDOPA-LEVODOPA ER 50-200 MG PO TBCR: EXTENDED_RELEASE_TABLET | ORAL | 3 refills | Status: DC

## 2017-11-01 MED ORDER — CARBIDOPA-LEVODOPA 25-100 MG PO TABS: 0.5000 | ORAL_TABLET | Freq: Four times a day (QID) | ORAL | 2 refills | Status: DC

## 2017-11-01 MED ORDER — PRAMIPEXOLE DIHYDROCHLORIDE 1.5 MG PO TABS: ORAL_TABLET | ORAL | 3 refills | Status: DC

## 2017-11-01 MED ORDER — SELEGILINE HCL 5 MG PO TABS: 5.0000 mg | ORAL_TABLET | Freq: Two times a day (BID) | ORAL | 3 refills | Status: DC

## 2017-11-01 NOTE — Patient Instructions (Signed)
We will start Sinemet 25/100 mg 1/2 tablet 4 times a day with the Sinemet CR 50/200 mg tablet.   Sinemet (carbidopa) may result in confusion or hallucinations, drowsiness, nausea, or dizziness. If any significant side effects are noted, please contact our office. Sinemet may not be well absorbed when taken with high protein meals, if tolerated it is best to take 30-45 minutes before you eat.

## 2017-11-01 NOTE — Progress Notes (Signed)
Reason for visit: Parkinson's disease  Elijah [name] is an 77 y.o. male  History of present illness:  Elijah [name] is a 77 year old left-handed white male with a history of Parkinson's disease and chronic low back pain.  He has developed some left hip pain, he got an injection within the last 2 weeks which has helped some.  The patient has not had any recent falls, but he has had increasing problems with walking, he has had to convert to using a walker most the time for ambulation.  The patient has more freezing if he does not use a walker.  He finds it difficult to get up out of a low chair.  He has difficulty getting his feet to move when he starts walking.  The patient has no problems with swallowing, he is sleeping well at night.  He has done better off of the Ativan in terms of his ability to rest at night.  He still talks in his sleep.  The patient returns the office today for an evaluation.  He remains on Sinemet CR 50/200 mg tablet taking 1 tablet 4 times daily, he takes selegiline 5 mg twice daily, and he takes Mirapex 1.5 mg 3 times daily.  Past Medical History:  Diagnosis Date  . Chronic low back pain 03/24/2015  . Degenerative arthritis   . Diabetes mellitus without complication ([facility])   . Dyslipidemia   . Glaucoma   . Gout 10/07/2012  . Hypertension   . Low back pain   . Obesity   . OSA (obstructive sleep apnea) 04/10/2014  . Pancreatitis   . Parkinson disease ([place])   . Rotator cuff tear    Bilateral, no surgery    Past Surgical History:  Procedure Laterality Date  . APPENDECTOMY    . BALLOON DILATION N/A 09/06/2015   Procedure: BALLOON DILATION;  Surgeon: Daneil Dolin, MD;  [facility];  Service: Endoscopy;  Laterality: N/A;  . BILIARY STENT PLACEMENT N/A 09/08/2015   Procedure: BILIARY STENT PLACEMENT;  Surgeon: Daneil Dolin, MD;  [facility];  Service: Endoscopy;  Laterality: N/A;  . ERCP N/A 09/06/2015   Procedure: ENDOSCOPIC RETROGRADE  CHOLANGIOPANCREATOGRAPHY (ERCP);  Surgeon: Daneil Dolin, MD;  [facility];  Service: Endoscopy;  Laterality: N/A;  . ERCP N/A 09/08/2015   Procedure: ENDOSCOPIC RETROGRADE CHOLANGIOPANCREATOGRAPHY (ERCP);  Surgeon: Daneil Dolin, MD;  [facility];  Service: Endoscopy;  Laterality: N/A;  with bile duct brushings  . HERNIA REPAIR    . SHOULDER BONE SPUR Left    RESECTION  . SPHINCTEROTOMY N/A 09/06/2015   Procedure: SPHINCTEROTOMY;  Surgeon: Daneil Dolin, MD;  [facility];  Service: Endoscopy;  Laterality: N/A;  . TONSILLECTOMY      Family History  Problem Relation Age of Onset  . Stroke Mother   . Diabetes Mother   . Heart Problems Mother   . Parkinsonism Father   . Heart Problems Father     Social history:  reports that he has never smoked. He has never used smokeless tobacco. He reports that he does not drink alcohol or use drugs.    Allergies  Allergen Reactions  . Aspirin Other (See Comments)    Stomach ulcers.  . Coconut Oil     Other reaction(s): GI Upset (intolerance)  . Penicillins Rash    Has patient had a PCN reaction causing immediate rash, facial/tongue/throat swelling, SOB or lightheadedness with hypotension: no Has  patient had a PCN reaction causing severe rash involving mucus membranes or skin necrosis: No Has patient had a PCN reaction that required hospitalization No Has patient had a PCN reaction occurring within the last 10 years: No If all of the above answers are "NO", then may proceed with Cephalosporin use.     Medications:  Prior to Admission medications   Medication Sig Start Date End Date Taking? Authorizing Provider  ACCU-CHEK AVIVA PLUS test strip  12/05/13  Yes [provider]  allopurinol (ZYLOPRIM) 100 MG tablet Take 100 mg by mouth daily.  10/05/12  Yes [provider]  aspirin 81 MG tablet Take 81 mg by mouth daily.   Yes [provider]  Azelastine HCl 0.15 % SOLN Place 1  spray into the nose daily as needed. 08/18/15  Yes [provider]  benzonatate (TESSALON) 200 MG capsule Take 200 mg by mouth 2 (two) times daily. 03/17/16  Yes [provider]  carbidopa-levodopa (SINEMET CR) 50-200 MG tablet TAKE (1) TABLET BY MOUTH (4) TIMES DAILY. 11/01/17  Yes Kathrynn Ducking, MD  cyclobenzaprine (FLEXERIL) 10 MG tablet Take 1 tablet (10 mg total) by mouth 2 (two) times daily as needed for muscle spasms. 07/02/12  Yes Teressa Lower, MD  furosemide (LASIX) 20 MG tablet Take 1 tablet by mouth 2 (two) times daily as needed for fluid or edema.  10/02/13  Yes [provider]  meloxicam (MOBIC) 15 MG tablet Take 15 mg by mouth daily as needed for pain.  09/30/12  Yes [provider]  metFORMIN (GLUCOPHAGE) 500 MG tablet Take 500 mg by mouth 2 (two) times daily with a meal.  10/05/12  Yes [provider]  metoprolol tartrate (LOPRESSOR) 25 MG tablet Take 12.5 mg by mouth daily. 1/2 TABLET DAILY   Yes [provider]  pantoprazole (PROTONIX) 40 MG tablet Take 40 mg by mouth daily. 03/08/14  Yes [provider]  potassium chloride SA (K-DUR,KLOR-CON) 20 MEQ tablet Take 1 tablet by mouth daily.  10/24/13  Yes [provider]  pramipexole (MIRAPEX) 1.5 MG tablet TAKE (1) TABLET BY MOUTH (3) TIMES DAILY. 11/01/17  Yes Kathrynn Ducking, MD  SALINE MIST SPRAY NA Place 1 spray into the nose as needed.    Yes [provider]  selegiline (ELDEPRYL) 5 MG tablet Take 1 tablet (5 mg total) by mouth 2 (two) times daily with a meal. 11/01/17  Yes Kathrynn Ducking, MD  simvastatin (ZOCOR) 40 MG tablet Take 20 mg by mouth at bedtime.  09/05/12  Yes [provider]  valsartan-hydrochlorothiazide (DIOVAN-HCT) 80-12.5 MG per tablet Take 1 tablet by mouth daily.   Yes [provider]  carbidopa-levodopa (SINEMET IR) 25-100 MG tablet Take 0.5 tablets by mouth 4 (four) times daily. 11/01/17   Kathrynn Ducking, MD     ROS:  Out of a complete 14 system review of symptoms, the patient complains only of the following symptoms, and all other reviewed systems are negative.  Joint pain, back pain, walking difficulty Sleep talking  Blood pressure (!) 159/65, pulse 62, height 5\' 10"  (1.778 m), weight 213 lb 8 oz (96.8 kg).  Physical Exam  General: The patient is alert and cooperative at the time of the examination.  The patient is moderately obese.  Skin: No significant peripheral edema is noted.   Neurologic Exam  Mental status: The patient is alert and oriented x 3 at the time of the examination. The patient has apparent normal recent  and remote memory, with an apparently normal attention span and concentration ability.   Cranial nerves: Facial symmetry is present. Speech is normal, no aphasia or dysarthria is noted. Extraocular movements are full. Visual fields are full.  Masking of the face is seen.  Motor: The patient has good strength in all 4 extremities.  Sensory examination: Soft touch sensation is symmetric on the face, arms, and legs.  Coordination: The patient has good finger-nose-finger and heel-to-shin bilaterally.  Gait and station: The patient has a tendency to lean to the right, even while sitting.  The patient has a stooped posture, he is able to walk with a walker with good stride and good turns.  Romberg is negative.  Tandem gait was not attempted.  Reflexes: Deep tendon reflexes are symmetric.   Assessment/Plan:  1.  Parkinson's disease  2.  Chronic low back pain   The patient is having increasing problems with ambulation, some freezing problems, we will go up on the Sinemet adding the Sinemet IR 25/100 mg tablets, the patient is to take 1/2 tablet 4 times a day with the Sinemet CR.  Prescriptions were sent in for the Sinemet CR, Mirapex, and for selegiline.  He will follow-up in 5 months.  Jill Alexanders MD 11/01/2017 11:37 AM  [facility] [address] [place], [place] [address]  Phone [phone] Fax [phone]

## 2017-11-09 DIAGNOSIS — I10 Essential (primary) hypertension: Secondary | ICD-10-CM | POA: Diagnosis not present

## 2017-11-09 DIAGNOSIS — E782 Mixed hyperlipidemia: Secondary | ICD-10-CM | POA: Diagnosis not present

## 2017-11-09 DIAGNOSIS — E119 Type 2 diabetes mellitus without complications: Secondary | ICD-10-CM | POA: Diagnosis not present

## 2017-11-13 DIAGNOSIS — Z6835 Body mass index (BMI) 35.0-35.9, adult: Secondary | ICD-10-CM | POA: Diagnosis not present

## 2017-11-13 DIAGNOSIS — I10 Essential (primary) hypertension: Secondary | ICD-10-CM | POA: Diagnosis not present

## 2017-11-13 DIAGNOSIS — E11621 Type 2 diabetes mellitus with foot ulcer: Secondary | ICD-10-CM | POA: Diagnosis not present

## 2017-11-13 DIAGNOSIS — M545 Low back pain: Secondary | ICD-10-CM | POA: Diagnosis not present

## 2017-11-13 DIAGNOSIS — E782 Mixed hyperlipidemia: Secondary | ICD-10-CM | POA: Diagnosis not present

## 2017-11-13 DIAGNOSIS — G20 Parkinson's disease: Secondary | ICD-10-CM | POA: Diagnosis not present

## 2017-11-13 DIAGNOSIS — K839 Disease of biliary tract, unspecified: Secondary | ICD-10-CM | POA: Diagnosis not present

## 2017-11-13 DIAGNOSIS — M2550 Pain in unspecified joint: Secondary | ICD-10-CM | POA: Diagnosis not present

## 2017-11-15 ENCOUNTER — Telehealth: Payer: Self-pay | Admitting: Neurology

## 2017-11-15 NOTE — Telephone Encounter (Signed)
I called the wife.  The patient is having hallucinations in the evening seeing people and animals in the house.  The Sinemet 25/100 mg tablets were just added taking 1/2 tablet with each dose of the Sinemet CR 4 times daily.  He will cut back on this medication taking the 1/2 tablet with the morning and noon dose, not the third of fourth doses of the day.  They will call me if the hallucinations continue.

## 2017-11-15 NOTE — Telephone Encounter (Signed)
Pt's wife Linda/DPR said since increasingcarbidopa-levodopa (SINEMET IR) 25-100 MG tablet he is seeing animals and people in bedroom and says they are down the hall the past 3-4 nights. Last night he got his gun out bc he thought 2 men were sitting in the bedroom. She has taken the gun and hid it during the night. She said he seems normal this morning. She was teary-eyed. Please call to advise.

## 2017-11-27 NOTE — Telephone Encounter (Signed)
Pt's wife called, he was still having hallucinations during the day after cutting back on the 3rd dose. She said he was better but she decided to discontinue the medication 11/23/17. She said he has been "a whole lot better" .  FYI

## 2017-11-28 DIAGNOSIS — I10 Essential (primary) hypertension: Secondary | ICD-10-CM | POA: Diagnosis not present

## 2017-11-28 DIAGNOSIS — K828 Other specified diseases of gallbladder: Secondary | ICD-10-CM | POA: Diagnosis not present

## 2017-11-28 DIAGNOSIS — M545 Low back pain: Secondary | ICD-10-CM | POA: Diagnosis not present

## 2017-11-28 DIAGNOSIS — Z6841 Body Mass Index (BMI) 40.0 and over, adult: Secondary | ICD-10-CM | POA: Diagnosis not present

## 2017-11-28 DIAGNOSIS — E119 Type 2 diabetes mellitus without complications: Secondary | ICD-10-CM | POA: Diagnosis not present

## 2017-11-28 DIAGNOSIS — E782 Mixed hyperlipidemia: Secondary | ICD-10-CM | POA: Diagnosis not present

## 2017-11-28 DIAGNOSIS — G20 Parkinson's disease: Secondary | ICD-10-CM | POA: Diagnosis not present

## 2017-11-28 DIAGNOSIS — M25552 Pain in left hip: Secondary | ICD-10-CM | POA: Diagnosis not present

## 2017-11-28 DIAGNOSIS — M1990 Unspecified osteoarthritis, unspecified site: Secondary | ICD-10-CM | POA: Diagnosis not present

## 2017-11-28 DIAGNOSIS — M2550 Pain in unspecified joint: Secondary | ICD-10-CM | POA: Diagnosis not present

## 2017-11-28 DIAGNOSIS — Z0000 Encounter for general adult medical examination without abnormal findings: Secondary | ICD-10-CM | POA: Diagnosis not present

## 2017-11-29 ENCOUNTER — Telehealth: Payer: Self-pay | Admitting: Neurology

## 2017-11-29 MED ORDER — PRAMIPEXOLE DIHYDROCHLORIDE 1.5 MG PO TABS: ORAL_TABLET | ORAL | 3 refills | Status: DC

## 2017-11-29 MED ORDER — CARBIDOPA-LEVODOPA ER 50-200 MG PO TBCR: EXTENDED_RELEASE_TABLET | ORAL | 3 refills | Status: DC

## 2017-11-29 MED ORDER — SELEGILINE HCL 5 MG PO TABS: 5.0000 mg | ORAL_TABLET | Freq: Two times a day (BID) | ORAL | 3 refills | Status: DC

## 2017-11-29 NOTE — Telephone Encounter (Signed)
[email or web address]  [email or web address] Hall's office has called asking if refills on carbidopa-levodopa (SINEMET CR) 50-200 MG tablet pramipexole (MIRAPEX) 1.5 MG tablet &  selegiline (ELDEPRYL) 5 MG tablet can be sent to [facility] phone [phone] fax [phone]

## 2017-11-29 NOTE — Addendum Note (Signed)
Addended by: Belinda Block A on: 11/29/2017 11:51 AM   Modules accepted: Orders

## 2017-11-29 NOTE — Telephone Encounter (Signed)
Refills sent to new pharmacy.  

## 2017-12-03 DIAGNOSIS — M545 Low back pain: Secondary | ICD-10-CM | POA: Diagnosis not present

## 2017-12-07 ENCOUNTER — Other Ambulatory Visit (HOSPITAL_COMMUNITY): Payer: Self-pay | Admitting: Podiatry

## 2017-12-07 DIAGNOSIS — E1142 Type 2 diabetes mellitus with diabetic polyneuropathy: Secondary | ICD-10-CM | POA: Diagnosis not present

## 2017-12-07 DIAGNOSIS — M2011 Hallux valgus (acquired), right foot: Secondary | ICD-10-CM | POA: Diagnosis not present

## 2017-12-07 DIAGNOSIS — M2041 Other hammer toe(s) (acquired), right foot: Secondary | ICD-10-CM | POA: Diagnosis not present

## 2017-12-07 DIAGNOSIS — L97512 Non-pressure chronic ulcer of other part of right foot with fat layer exposed: Secondary | ICD-10-CM

## 2017-12-12 ENCOUNTER — Ambulatory Visit (HOSPITAL_COMMUNITY)
Admission: RE | Admit: 2017-12-12 | Discharge: 2017-12-12 | Disposition: A | Discharge status: Home / Self Care | Payer: PPO | Source: Ambulatory Visit | Attending: Podiatry | Admitting: Podiatry

## 2017-12-12 ENCOUNTER — Ambulatory Visit (HOSPITAL_COMMUNITY): Payer: PPO

## 2017-12-12 DIAGNOSIS — L97519 Non-pressure chronic ulcer of other part of right foot with unspecified severity: Secondary | ICD-10-CM | POA: Diagnosis not present

## 2017-12-12 DIAGNOSIS — L97512 Non-pressure chronic ulcer of other part of right foot with fat layer exposed: Secondary | ICD-10-CM

## 2017-12-21 DIAGNOSIS — M2041 Other hammer toe(s) (acquired), right foot: Secondary | ICD-10-CM | POA: Diagnosis not present

## 2017-12-21 DIAGNOSIS — M2011 Hallux valgus (acquired), right foot: Secondary | ICD-10-CM | POA: Diagnosis not present

## 2017-12-21 DIAGNOSIS — E1142 Type 2 diabetes mellitus with diabetic polyneuropathy: Secondary | ICD-10-CM | POA: Diagnosis not present

## 2017-12-21 DIAGNOSIS — L97512 Non-pressure chronic ulcer of other part of right foot with fat layer exposed: Secondary | ICD-10-CM | POA: Diagnosis not present

## 2018-01-02 DIAGNOSIS — Z23 Encounter for immunization: Secondary | ICD-10-CM | POA: Diagnosis not present

## 2018-01-02 DIAGNOSIS — L539 Erythematous condition, unspecified: Secondary | ICD-10-CM | POA: Diagnosis not present

## 2018-01-04 DIAGNOSIS — M869 Osteomyelitis, unspecified: Secondary | ICD-10-CM | POA: Diagnosis not present

## 2018-01-04 DIAGNOSIS — L97512 Non-pressure chronic ulcer of other part of right foot with fat layer exposed: Secondary | ICD-10-CM | POA: Diagnosis not present

## 2018-01-04 DIAGNOSIS — M109 Gout, unspecified: Secondary | ICD-10-CM | POA: Diagnosis not present

## 2018-01-08 DIAGNOSIS — M869 Osteomyelitis, unspecified: Secondary | ICD-10-CM | POA: Diagnosis not present

## 2018-01-08 DIAGNOSIS — L97512 Non-pressure chronic ulcer of other part of right foot with fat layer exposed: Secondary | ICD-10-CM | POA: Diagnosis not present

## 2018-01-15 ENCOUNTER — Other Ambulatory Visit (HOSPITAL_COMMUNITY): Payer: Self-pay | Admitting: Podiatry

## 2018-01-15 DIAGNOSIS — L97512 Non-pressure chronic ulcer of other part of right foot with fat layer exposed: Secondary | ICD-10-CM | POA: Diagnosis not present

## 2018-01-15 DIAGNOSIS — M2011 Hallux valgus (acquired), right foot: Secondary | ICD-10-CM | POA: Diagnosis not present

## 2018-01-15 DIAGNOSIS — M869 Osteomyelitis, unspecified: Secondary | ICD-10-CM | POA: Diagnosis not present

## 2018-01-15 DIAGNOSIS — M2041 Other hammer toe(s) (acquired), right foot: Secondary | ICD-10-CM | POA: Diagnosis not present

## 2018-01-21 ENCOUNTER — Ambulatory Visit (HOSPITAL_COMMUNITY)
Admission: RE | Admit: 2018-01-21 | Discharge: 2018-01-21 | Disposition: A | Discharge status: Home / Self Care | Payer: PPO | Source: Ambulatory Visit | Attending: Podiatry | Admitting: Podiatry

## 2018-01-21 DIAGNOSIS — M869 Osteomyelitis, unspecified: Secondary | ICD-10-CM | POA: Diagnosis not present

## 2018-01-21 DIAGNOSIS — L03115 Cellulitis of right lower limb: Secondary | ICD-10-CM | POA: Insufficient documentation

## 2018-01-21 DIAGNOSIS — R600 Localized edema: Secondary | ICD-10-CM | POA: Diagnosis not present

## 2018-01-25 DIAGNOSIS — M2041 Other hammer toe(s) (acquired), right foot: Secondary | ICD-10-CM | POA: Diagnosis not present

## 2018-01-25 DIAGNOSIS — M2011 Hallux valgus (acquired), right foot: Secondary | ICD-10-CM | POA: Diagnosis not present

## 2018-01-25 DIAGNOSIS — M869 Osteomyelitis, unspecified: Secondary | ICD-10-CM | POA: Diagnosis not present

## 2018-01-25 DIAGNOSIS — L97512 Non-pressure chronic ulcer of other part of right foot with fat layer exposed: Secondary | ICD-10-CM | POA: Diagnosis not present

## 2018-01-28 ENCOUNTER — Other Ambulatory Visit: Payer: Self-pay | Admitting: Podiatry

## 2018-01-29 ENCOUNTER — Encounter (HOSPITAL_COMMUNITY): Payer: Self-pay

## 2018-01-29 ENCOUNTER — Other Ambulatory Visit: Payer: Self-pay

## 2018-01-29 ENCOUNTER — Encounter (HOSPITAL_COMMUNITY)
Admission: RE | Admit: 2018-01-29 | Discharge: 2018-01-29 | Disposition: A | Discharge status: Home / Self Care | Payer: PPO | Source: Ambulatory Visit | Attending: Podiatry | Admitting: Podiatry

## 2018-01-29 DIAGNOSIS — Z01818 Encounter for other preprocedural examination: Secondary | ICD-10-CM | POA: Insufficient documentation

## 2018-01-29 DIAGNOSIS — E1142 Type 2 diabetes mellitus with diabetic polyneuropathy: Secondary | ICD-10-CM | POA: Diagnosis not present

## 2018-01-29 DIAGNOSIS — M869 Osteomyelitis, unspecified: Secondary | ICD-10-CM | POA: Diagnosis not present

## 2018-01-29 DIAGNOSIS — E11621 Type 2 diabetes mellitus with foot ulcer: Secondary | ICD-10-CM | POA: Diagnosis not present

## 2018-01-29 DIAGNOSIS — I10 Essential (primary) hypertension: Secondary | ICD-10-CM | POA: Diagnosis not present

## 2018-01-29 DIAGNOSIS — E1169 Type 2 diabetes mellitus with other specified complication: Secondary | ICD-10-CM | POA: Diagnosis not present

## 2018-01-29 DIAGNOSIS — E1152 Type 2 diabetes mellitus with diabetic peripheral angiopathy with gangrene: Secondary | ICD-10-CM | POA: Diagnosis not present

## 2018-01-29 DIAGNOSIS — I96 Gangrene, not elsewhere classified: Secondary | ICD-10-CM | POA: Diagnosis not present

## 2018-01-29 DIAGNOSIS — L97512 Non-pressure chronic ulcer of other part of right foot with fat layer exposed: Secondary | ICD-10-CM | POA: Diagnosis not present

## 2018-01-29 DIAGNOSIS — L97519 Non-pressure chronic ulcer of other part of right foot with unspecified severity: Secondary | ICD-10-CM | POA: Diagnosis not present

## 2018-01-29 LAB — BASIC METABOLIC PANEL
Anion gap: 8 (ref 5–15)
BUN: 19 mg/dL (ref 8–23)
CO2: 26 mmol/L (ref 22–32)
Calcium: 8.8 mg/dL — ABNORMAL LOW (ref 8.9–10.3)
Chloride: 103 mmol/L (ref 98–111)
Creatinine, Ser: 0.83 mg/dL (ref 0.61–1.24)
GFR calc Af Amer: >60 mL/min (ref >60)
Glucose, Bld: 150 mg/dL — ABNORMAL HIGH (ref 70–99)
Potassium: 3.7 mmol/L (ref 3.5–5.1)
Sodium: 137 mmol/L (ref 135–145)

## 2018-01-29 LAB — CBC WITH DIFFERENTIAL/PLATELET
Abs Immature Granulocytes: 0.03 10*3/uL (ref 0.00–0.07)
BASOS PCT: 1 %
Basophils Absolute: 0.1 10*3/uL (ref 0.0–0.1)
Eosinophils Absolute: 0.1 10*3/uL (ref 0.0–0.5)
Eosinophils Relative: 2 %
HCT: 36.1 % — ABNORMAL LOW (ref 39.0–52.0)
Hemoglobin: 12 g/dL — ABNORMAL LOW (ref 13.0–17.0)
Immature Granulocytes: 0 %
Lymphocytes Relative: 23 %
Lymphs Abs: 1.6 10*3/uL (ref 0.7–4.0)
MCH: 30.5 pg (ref 26.0–34.0)
MCHC: 33.2 g/dL (ref 30.0–36.0)
MCV: 91.9 fL (ref 80.0–100.0)
Monocytes Absolute: 0.5 10*3/uL (ref 0.1–1.0)
Monocytes Relative: 8 %
NEUTROS PCT: 66 %
Neutro Abs: 4.4 10*3/uL (ref 1.7–7.7)
PLATELETS: 289 10*3/uL (ref 150–400)
RBC: 3.93 MIL/uL — AB (ref 4.22–5.81)
RDW: 13.8 % (ref 11.5–15.5)
WBC: 6.7 10*3/uL (ref 4.0–10.5)
nRBC: 0 % (ref 0.0–0.2)

## 2018-01-29 LAB — GLUCOSE, CAPILLARY: Glucose-Capillary: 163 mg/dL — ABNORMAL HIGH (ref 70–99)

## 2018-01-29 LAB — HEMOGLOBIN A1C
Hgb A1c MFr Bld: 6.2 % — ABNORMAL HIGH (ref 4.8–5.6)
Mean Plasma Glucose: 131.24 mg/dL

## 2018-01-29 NOTE — Patient Instructions (Signed)
Elijah [name]  01/29/2018     @PREFPERIOPPHARMACY @   Your procedure is scheduled on  01/31/2018 .  Report to [facility] at  615   A.M.  Call this number if you have problems the morning of surgery:  [phone]   Remember:  Do not eat or drink after midnight the night before your surgery.                      Take these medicines the morning of surgery with A SIP OF WATER  Sinemet, flexaril or mobic, ativan, metoprolol, protonix, selesiline, diovan.    Do not wear jewelry, make-up or nail polish.  Do not wear lotions, powders, or perfumes, or deodorant.  Do not shave 48 hours prior to surgery.  Men may shave face and neck.  Do not bring valuables to the hospital.  [facility] is not responsible for any belongings or valuables.  Contacts, dentures or bridgework may not be worn into surgery.  Leave your suitcase in the car.  After surgery it may be brought to your room.  For patients admitted to the hospital, discharge time will be determined by your treatment team.  Patients discharged the day of surgery will not be allowed to drive home.   Name and phone number of your driver:   family Special instructions:  None  Please read over the following fact sheets that you were given. Anesthesia Post-op Instructions and Care and Recovery After Surgery      Toe Amputation Toe amputation is a surgical procedure to remove all or part of a toe. You may have this procedure if:  Tissue in your toe is dying because of poor blood supply.  You have a severe infection in your toe.  Removing your toe keeps nearby tissue healthy. If the toe is infected, removing it helps to keep the infection from spreading. Tell a health care provider about:  Any allergies you have.  All medicines you are taking, including vitamins, herbs, eye drops, creams, and over-the-counter medicines.  Any problems you or family members have had with anesthetic medicines.  Any blood  disorders you have.  Any surgeries you have had.  Any medical conditions you have.  Whether you are pregnant or may be pregnant. What are the risks? Generally, this is a safe procedure. However, problems may occur, including:  Bleeding.  Buildup of blood and fluid (hematoma).  Infection.  Allergic reactions to medicines.  Tissue death in the flap of skin (flap necrosis).  Trouble with healing.  Minor changes in the way that you walk (your gait).  Feeling pain in the area that was removed (phantom pain). This is rare.  What happens before the procedure?  Follow instructions from your health care provider about eating or drinking restrictions.  Ask your health care provider about: ? Changing or stopping your regular medicines. This is especially important if you are taking diabetes medicines or blood thinners. ? Taking medicines such as aspirin and ibuprofen. These medicines can thin your blood. Do not take these medicines before your procedure if your health care provider instructs you not to.  You may be given antibiotic medicine to help prevent infection.  Plan to have someone take you home after the procedure.  If you will be going home right after the procedure, plan to have someone with you for 24 hours. What happens during the procedure?  You will be given one  or more of the following: ? A medicine to help you relax (sedative). ? A medicine to numb the area (local anesthetic). ? A medicine to make you fall asleep (general anesthetic). ? A medicine that is injected into your spine to numb the area below and slightly above the injection site (spinal anesthetic). ? A medicine that is injected into an area of your body to numb everything below the injection site (regional anesthetic).  To reduce your risk of infection: ? Your health care team will wash or sanitize their hands. ? Your skin will be washed with soap.  Your surgeon will mark the area of your toe for  removal.  Your surgeon will make a surgical cut (incision) in your toe.  The dead tissue and bone will be removed.  Nerves and vessels will be tied or heated with a special tool to stop bleeding.  The area will be drained and cleaned.  If only part of a toe is removed, the remaining part will be covered with a flap of skin.  The incision will be treated in one of these ways: ? It will be closed with stitches (sutures). ? It will be left open to heal if there is an infection.  The incision area may be packed with gauze and covered with bandages (dressings).  Tissue samples may be sent to a lab to be examined under a microscope. The procedure may vary among health care providers and hospitals. What happens after the procedure?  Your blood pressure, heart rate, breathing rate, and blood oxygen level will be monitored often until the medicines you were given have worn off.  Your foot will be raised up high (elevated) to relieve swelling.  You will be monitored for pain.  You will be given pain medicines and antibiotics.  Your health care provider or physical therapist will help you to move around as soon as possible.  Do not drive for 24 hours if you received a sedative. This information is not intended to replace advice given to you by your health care provider. Make sure you discuss any questions you have with your health care provider. Document Released: 01/18/2015 Document Revised: 10/11/2015 Document Reviewed: 10/31/2014 Elsevier Interactive Patient Education  2018 [place].  Toe Amputation, Care After Refer to this sheet in the next few weeks. These instructions provide you with information on caring for yourself after your procedure. Your health care provider may also give you more specific instructions. Your treatment has been planned according to current medical practices, but problems sometimes occur. Call your health care provider if you have any problems or  questions after your procedure. What can I expect after the procedure? After your procedure, it is common to have some pain. Pain usually improves within a week. Follow these instructions at home: Medicines  Take your antibiotic medicine as told by your health care provider. Do not stop taking the antibiotic even if you start to feel better.  Take over-the-counter and prescription medicines only as told by your health care provider. Managing pain, stiffness, and swelling   If directed, apply ice to the surgical area: ? Put ice in a plastic bag. ? Place a towel between your skin and the bag. ? Leave the ice on for 20 minutes, 2-3 times a day.  Raise (elevate) your foot so it is above the level of your heart. This helps to reduce swelling.  Try to walk each day. Incision care   Follow instructions from your health care provider  about how to take care of your cut from surgery (incision). Make sure you: ? Wash your hands with soap and water before you change your bandage (dressing). If soap and water are not available, use hand sanitizer. ? Change your dressing as told by your health care provider. ? If you got stitches (sutures), leave them in place. They may need to stay in place for 2 weeks or longer.  Check your incision area every day for signs of infection. Check for: ? More redness, swelling, or pain. ? More fluid or blood. ? Warmth. ? Pus or a bad smell. Bathing  Do not take baths, swim, use a hot tub, or soak your foot until your health care provider approves.  You may shower unless you were told not to. When you shower, keep your dressing dry.  If your dressing has been removed, you may wash your skin with warm water and soap. Driving  Do not drive for 24 hours if you received a sedative.  Do not drive or operate heavy machinery while taking prescription pain medicine. Activity  Do exercises as told by your health care provider.  Return to your normal activities  as told by your health care provider. Ask your health care provider what activities are safe for you. General instructions  Do not use any tobacco products, such as cigarettes, chewing tobacco, and e-cigarettes. If you need help quitting, ask your health care provider.  Ask your health care provider about wearing special shoes or using inserts to support your foot.  If you have diabetes, keep your blood sugar under control.  Keep all follow-up visits as told by your health care provider. This is important. Contact a health care provider if:  You have more redness around your incision.  You have more fluid or blood coming from your incision.  Your incision feels warm to the touch.  You have pus or a bad smell coming from your incision.  You have a fever.  Your dressing is soaked with blood.  Your sutures tear or they separate.  You have numbness or tingling in your toes or foot.  Your foot is cool or pale, or it changes color.  Your pain does not improve after you take your medicine. Get help right away if:  You have pain or swelling that gets worse or does not go away.  You have red streaks on your skin near your toes, foot, or leg.  You have pain in your calf or behind your knee.  You have shortness of breath.  You have chest pain. This information is not intended to replace advice given to you by your health care provider. Make sure you discuss any questions you have with your health care provider. Document Released: 01/18/2015 Document Revised: 10/11/2015 Document Reviewed: 10/31/2014 Elsevier Interactive Patient Education  2018 [place] Anesthesia is a term that refers to techniques, procedures, and medicines that help a person stay safe and comfortable during a medical procedure. Monitored anesthesia care, or sedation, is one type of anesthesia. Your anesthesia specialist may recommend sedation if you will be having a procedure that  does not require you to be unconscious, such as:  Cataract surgery.  A dental procedure.  A biopsy.  A colonoscopy.  During the procedure, you may receive a medicine to help you relax (sedative). There are three levels of sedation:  Mild sedation. At this level, you may feel awake and relaxed. You will be able to follow directions.  Moderate sedation. At this level, you will be sleepy. You may not remember the procedure.  Deep sedation. At this level, you will be asleep. You will not remember the procedure.  The more medicine you are given, the deeper your level of sedation will be. Depending on how you respond to the procedure, the anesthesia specialist may change your level of sedation or the type of anesthesia to fit your needs. An anesthesia specialist will monitor you closely during the procedure. Let your health care provider know about:  Any allergies you have.  All medicines you are taking, including vitamins, herbs, eye drops, creams, and over-the-counter medicines.  Any use of steroids (by mouth or as a cream).  Any problems you or family members have had with sedatives and anesthetic medicines.  Any blood disorders you have.  Any surgeries you have had.  Any medical conditions you have, such as sleep apnea.  Whether you are pregnant or may be pregnant.  Any use of cigarettes, alcohol, or street drugs. What are the risks? Generally, this is a safe procedure. However, problems may occur, including:  Getting too much medicine (oversedation).  Nausea.  Allergic reaction to medicines.  Trouble breathing. If this happens, a breathing tube may be used to help with breathing. It will be removed when you are awake and breathing on your own.  Heart trouble.  Lung trouble.  Before the procedure Staying hydrated Follow instructions from your health care provider about hydration, which may include:  Up to 2 hours before the procedure - you may continue to drink  clear liquids, such as water, clear fruit juice, black coffee, and plain tea.  Eating and drinking restrictions Follow instructions from your health care provider about eating and drinking, which may include:  8 hours before the procedure - stop eating heavy meals or foods such as meat, fried foods, or fatty foods.  6 hours before the procedure - stop eating light meals or foods, such as toast or cereal.  6 hours before the procedure - stop drinking milk or drinks that contain milk.  2 hours before the procedure - stop drinking clear liquids.  Medicines Ask your health care provider about:  Changing or stopping your regular medicines. This is especially important if you are taking diabetes medicines or blood thinners.  Taking medicines such as aspirin and ibuprofen. These medicines can thin your blood. Do not take these medicines before your procedure if your health care provider instructs you not to.  Tests and exams  You will have a physical exam.  You may have blood tests done to show: ? How well your kidneys and liver are working. ? How well your blood can clot.  General instructions  Plan to have someone take you home from the hospital or clinic.  If you will be going home right after the procedure, plan to have someone with you for 24 hours.  What happens during the procedure?  Your blood pressure, heart rate, breathing, level of pain and overall condition will be monitored.  An IV tube will be inserted into one of your veins.  Your anesthesia specialist will give you medicines as needed to keep you comfortable during the procedure. This may mean changing the level of sedation.  The procedure will be performed. After the procedure  Your blood pressure, heart rate, breathing rate, and blood oxygen level will be monitored until the medicines you were given have worn off.  Do not drive for 24 hours if you received  a sedative.  You may: ? Feel sleepy, clumsy, or  nauseous. ? Feel forgetful about what happened after the procedure. ? Have a sore throat if you had a breathing tube during the procedure. ? Vomit. This information is not intended to replace advice given to you by your health care provider. Make sure you discuss any questions you have with your health care provider. Document Released: 11/02/2004 Document Revised: 07/16/2015 Document Reviewed: 05/30/2015 Elsevier Interactive Patient Education  2018 [place], Care After These instructions provide you with information about caring for yourself after your procedure. Your health care provider may also give you more specific instructions. Your treatment has been planned according to current medical practices, but problems sometimes occur. Call your health care provider if you have any problems or questions after your procedure. What can I expect after the procedure? After your procedure, it is common to:  Feel sleepy for several hours.  Feel clumsy and have poor balance for several hours.  Feel forgetful about what happened after the procedure.  Have poor judgment for several hours.  Feel nauseous or vomit.  Have a sore throat if you had a breathing tube during the procedure.  Follow these instructions at home: For at least 24 hours after the procedure:   Do not: ? Participate in activities in which you could fall or become injured. ? Drive. ? Use heavy machinery. ? Drink alcohol. ? Take sleeping pills or medicines that cause drowsiness. ? Make important decisions or sign legal documents. ? Take care of children on your own.  Rest. Eating and drinking  Follow the diet that is recommended by your health care provider.  If you vomit, drink water, juice, or soup when you can drink without vomiting.  Make sure you have little or no nausea before eating solid foods. General instructions  Have a responsible adult stay with you until you are awake  and alert.  Take over-the-counter and prescription medicines only as told by your health care provider.  If you smoke, do not smoke without supervision.  Keep all follow-up visits as told by your health care provider. This is important. Contact a health care provider if:  You keep feeling nauseous or you keep vomiting.  You feel light-headed.  You develop a rash.  You have a fever. Get help right away if:  You have trouble breathing. This information is not intended to replace advice given to you by your health care provider. Make sure you discuss any questions you have with your health care provider. Document Released: 05/30/2015 Document Revised: 09/29/2015 Document Reviewed: 05/30/2015 Elsevier Interactive Patient Education  [employer].

## 2018-01-30 NOTE — Pre-Procedure Instructions (Signed)
HgbA1C routed to Dr Caprice Beaver and PCP, Dr Nevada Crane.

## 2018-01-31 ENCOUNTER — Ambulatory Visit (HOSPITAL_COMMUNITY): Payer: PPO | Admitting: Anesthesiology

## 2018-01-31 ENCOUNTER — Encounter (HOSPITAL_COMMUNITY)
Admission: RE | Disposition: A | Discharge status: Home / Self Care | Payer: Self-pay | Source: Home / Self Care | Attending: Podiatry

## 2018-01-31 ENCOUNTER — Ambulatory Visit (HOSPITAL_COMMUNITY): Payer: PPO

## 2018-01-31 ENCOUNTER — Encounter (HOSPITAL_COMMUNITY): Payer: Self-pay

## 2018-01-31 ENCOUNTER — Ambulatory Visit (HOSPITAL_COMMUNITY)
Admission: RE | Admit: 2018-01-31 | Discharge: 2018-01-31 | Disposition: A | Discharge status: Home / Self Care | Payer: PPO | Attending: Podiatry | Admitting: Podiatry

## 2018-01-31 DIAGNOSIS — L97512 Non-pressure chronic ulcer of other part of right foot with fat layer exposed: Secondary | ICD-10-CM | POA: Diagnosis not present

## 2018-01-31 DIAGNOSIS — L97519 Non-pressure chronic ulcer of other part of right foot with unspecified severity: Secondary | ICD-10-CM | POA: Insufficient documentation

## 2018-01-31 DIAGNOSIS — I10 Essential (primary) hypertension: Secondary | ICD-10-CM | POA: Diagnosis not present

## 2018-01-31 DIAGNOSIS — E1169 Type 2 diabetes mellitus with other specified complication: Secondary | ICD-10-CM | POA: Insufficient documentation

## 2018-01-31 DIAGNOSIS — E1142 Type 2 diabetes mellitus with diabetic polyneuropathy: Secondary | ICD-10-CM | POA: Insufficient documentation

## 2018-01-31 DIAGNOSIS — I96 Gangrene, not elsewhere classified: Secondary | ICD-10-CM | POA: Insufficient documentation

## 2018-01-31 DIAGNOSIS — E1152 Type 2 diabetes mellitus with diabetic peripheral angiopathy with gangrene: Secondary | ICD-10-CM | POA: Diagnosis not present

## 2018-01-31 DIAGNOSIS — E11621 Type 2 diabetes mellitus with foot ulcer: Secondary | ICD-10-CM | POA: Diagnosis not present

## 2018-01-31 DIAGNOSIS — M869 Osteomyelitis, unspecified: Secondary | ICD-10-CM | POA: Diagnosis not present

## 2018-01-31 DIAGNOSIS — S98131A Complete traumatic amputation of one right lesser toe, initial encounter: Secondary | ICD-10-CM | POA: Diagnosis not present

## 2018-01-31 DIAGNOSIS — Z9889 Other specified postprocedural states: Secondary | ICD-10-CM

## 2018-01-31 DIAGNOSIS — M86671 Other chronic osteomyelitis, right ankle and foot: Secondary | ICD-10-CM | POA: Diagnosis not present

## 2018-01-31 HISTORY — PX: AMPUTATION: SHX166

## 2018-01-31 LAB — GLUCOSE, CAPILLARY
Glucose-Capillary: 107 mg/dL — ABNORMAL HIGH (ref 70–99)
Glucose-Capillary: 101 mg/dL — ABNORMAL HIGH (ref 70–99)

## 2018-01-31 SURGERY — AMPUTATION DIGIT
Anesthesia: Monitor Anesthesia Care | Site: Third Toe | Laterality: Right

## 2018-01-31 MED ORDER — MIDAZOLAM HCL 5 MG/5ML IJ SOLN
INTRAMUSCULAR | Status: DC | PRN
  Administered 2018-01-31: 1 mg via INTRAVENOUS

## 2018-01-31 MED ORDER — LACTATED RINGERS IV SOLN
INTRAVENOUS | Status: DC | PRN
  Administered 2018-01-31: 07:00:00 via INTRAVENOUS

## 2018-01-31 MED ORDER — BUPIVACAINE HCL (PF) 0.5 % IJ SOLN
INTRAMUSCULAR | Status: DC | PRN
  Administered 2018-01-31: 20 mL

## 2018-01-31 MED ORDER — BUPIVACAINE HCL (PF) 0.5 % IJ SOLN
INTRAMUSCULAR | Status: AC
  Filled 2018-01-31: qty 30

## 2018-01-31 MED ORDER — MIDAZOLAM HCL 2 MG/2ML IJ SOLN: 0.5000 mg | Freq: Once | INTRAMUSCULAR | Status: DC | PRN

## 2018-01-31 MED ORDER — PROPOFOL 10 MG/ML IV BOLUS
INTRAVENOUS | Status: AC
  Filled 2018-01-31: qty 20

## 2018-01-31 MED ORDER — PROMETHAZINE HCL 25 MG/ML IJ SOLN: 6.2500 mg | INTRAMUSCULAR | Status: DC | PRN

## 2018-01-31 MED ORDER — FENTANYL CITRATE (PF) 100 MCG/2ML IJ SOLN
INTRAMUSCULAR | Status: AC
  Filled 2018-01-31: qty 2

## 2018-01-31 MED ORDER — FENTANYL CITRATE (PF) 100 MCG/2ML IJ SOLN
INTRAMUSCULAR | Status: DC | PRN
  Administered 2018-01-31 (×2): 25 ug via INTRAVENOUS

## 2018-01-31 MED ORDER — 0.9 % SODIUM CHLORIDE (POUR BTL) OPTIME
TOPICAL | Status: DC | PRN
  Administered 2018-01-31: 1000 mL

## 2018-01-31 MED ORDER — CLINDAMYCIN PHOSPHATE 600 MG/50ML IV SOLN
600.0000 mg | Freq: Once | INTRAVENOUS | Status: AC
  Administered 2018-01-31: 600 mg via INTRAVENOUS
  Filled 2018-01-31: qty 50

## 2018-01-31 MED ORDER — HYDROCODONE-ACETAMINOPHEN 7.5-325 MG PO TABS: 1.0000 | ORAL_TABLET | Freq: Once | ORAL | Status: DC | PRN

## 2018-01-31 MED ORDER — PROPOFOL 500 MG/50ML IV EMUL
INTRAVENOUS | Status: DC | PRN
  Administered 2018-01-31: 75 ug/kg/min via INTRAVENOUS

## 2018-01-31 MED ORDER — HYDROMORPHONE HCL 1 MG/ML IJ SOLN: 0.2500 mg | INTRAMUSCULAR | Status: DC | PRN

## 2018-01-31 MED ORDER — PROPOFOL 10 MG/ML IV BOLUS
INTRAVENOUS | Status: DC | PRN
  Administered 2018-01-31 (×2): 20 mg via INTRAVENOUS
  Administered 2018-01-31: 10 mg via INTRAVENOUS

## 2018-01-31 MED ORDER — LACTATED RINGERS IV SOLN: INTRAVENOUS | Status: DC

## 2018-01-31 MED ORDER — MIDAZOLAM HCL 2 MG/2ML IJ SOLN
INTRAMUSCULAR | Status: AC
  Filled 2018-01-31: qty 2

## 2018-01-31 SURGICAL SUPPLY — 43 items
APL SKNCLS STERI-STRIP NONHPOA (GAUZE/BANDAGES/DRESSINGS) ×1
BANDAGE ELASTIC 4 LF NS (GAUZE/BANDAGES/DRESSINGS) ×1 IMPLANT
BANDAGE ELASTIC 4 VELCRO NS (GAUZE/BANDAGES/DRESSINGS) ×2 IMPLANT
BANDAGE ESMARK 4X12 BL STRL LF (DISPOSABLE) ×1 IMPLANT
BENZOIN TINCTURE PRP APPL 2/3 (GAUZE/BANDAGES/DRESSINGS) ×1 IMPLANT
BLADE SURG 15 STRL LF DISP TIS (BLADE) ×1 IMPLANT
BLADE SURG 15 STRL SS (BLADE) ×2
BNDG CMPR 12X4 ELC STRL LF (DISPOSABLE) ×1
BNDG CMPR MED 5X4 ELC HKLP NS (GAUZE/BANDAGES/DRESSINGS) ×1
BNDG CONFORM 2 STRL LF (GAUZE/BANDAGES/DRESSINGS) ×2 IMPLANT
BNDG ESMARK 4X12 BLUE STRL LF (DISPOSABLE) ×2
BNDG GAUZE ELAST 4 BULKY (GAUZE/BANDAGES/DRESSINGS) ×2 IMPLANT
CLOTH BEACON ORANGE TIMEOUT ST (SAFETY) ×2 IMPLANT
COVER LIGHT HANDLE STERIS (MISCELLANEOUS) ×4 IMPLANT
COVER WAND RF STERILE (DRAPES) ×1 IMPLANT
CUFF TOURNIQUET SINGLE 18IN (TOURNIQUET CUFF) ×2 IMPLANT
DECANTER SPIKE VIAL GLASS SM (MISCELLANEOUS) ×2 IMPLANT
DRESSING MEPILEX BORDER 6X8 (GAUZE/BANDAGES/DRESSINGS) IMPLANT
DRSG ADAPTIC 3X8 NADH LF (GAUZE/BANDAGES/DRESSINGS) ×2 IMPLANT
DRSG MEPILEX BORDER 6X8 (GAUZE/BANDAGES/DRESSINGS) ×2
ELECT REM PT RETURN 9FT ADLT (ELECTROSURGICAL) ×2
ELECTRODE REM PT RTRN 9FT ADLT (ELECTROSURGICAL) ×1 IMPLANT
GAUZE SPONGE 4X4 12PLY STRL (GAUZE/BANDAGES/DRESSINGS) ×2 IMPLANT
GLOVE BIO SURGEON STRL SZ7.5 (GLOVE) ×2 IMPLANT
GLOVE BIOGEL PI IND STRL 7.0 (GLOVE) ×1 IMPLANT
GLOVE BIOGEL PI INDICATOR 7.0 (GLOVE) ×1
GLOVE ECLIPSE 6.5 STRL STRAW (GLOVE) ×1 IMPLANT
GOWN STRL REUS W/TWL LRG LVL3 (GOWN DISPOSABLE) ×4 IMPLANT
KIT TURNOVER KIT A (KITS) ×2 IMPLANT
MANIFOLD NEPTUNE II (INSTRUMENTS) ×2 IMPLANT
NDL HYPO 27GX1-1/4 (NEEDLE) ×2 IMPLANT
NEEDLE HYPO 27GX1-1/4 (NEEDLE) ×4 IMPLANT
NS IRRIG 1000ML POUR BTL (IV SOLUTION) ×2 IMPLANT
PACK BASIC LIMB (CUSTOM PROCEDURE TRAY) ×2 IMPLANT
PAD ARMBOARD 7.5X6 YLW CONV (MISCELLANEOUS) ×2 IMPLANT
SET BASIN LINEN APH (SET/KITS/TRAYS/PACK) ×2 IMPLANT
SOL PREP PROV IODINE SCRUB 4OZ (MISCELLANEOUS) ×2 IMPLANT
SPONGE LAP 18X18 RF (DISPOSABLE) ×2 IMPLANT
STRIP CLOSURE SKIN 1/2X4 (GAUZE/BANDAGES/DRESSINGS) ×1 IMPLANT
SUT ETHILON 4 0 PS 2 18 (SUTURE) ×2 IMPLANT
SUT PROLENE 4 0 PS 2 18 (SUTURE) ×2 IMPLANT
SUT VIC AB 4-0 PS2 27 (SUTURE) ×1 IMPLANT
SYR CONTROL 10ML LL (SYRINGE) ×4 IMPLANT

## 2018-01-31 NOTE — Anesthesia Postprocedure Evaluation (Signed)
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)  Anesthesia Type: MAC Level of consciousness: awake and alert and oriented Pain management: pain level controlled Vital Signs Assessment: post-procedure vital signs reviewed and stable Respiratory status: spontaneous breathing Cardiovascular status: blood pressure returned to baseline and stable Postop Assessment: no apparent nausea or vomiting Anesthetic complications: no     Last Vitals:  Vitals:   01/31/18 0845 01/31/18 0911  BP: 134/69 (!) 143/62  Pulse: (!) 58 60  Resp: 19 18  Temp:  36.8 C  SpO2: 98% 99%    Last Pain:  Vitals:   01/31/18 0911  TempSrc: Oral  PainSc: 0-No pain                 Max Nuno

## 2018-01-31 NOTE — H&P (Signed)
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  The various methods of treatment have been discussed with the patient.  No guarantees were given.  After consideration of risks, benefits and other options for treatment, the patient has consented to surgery.  I have reviewed the patients' chart and labs.    Patient Vitals for the past 24 hrs:  BP Temp Temp src Pulse Resp SpO2 Height Weight  01/31/18 0702 (!) 143/75 98.1 F (36.7 C) Oral (!) 58 20 96 % - -  01/31/18 9735 - - - - - - 5\' 10"  (1.778 m) 90.7 kg    A history and physical examination was performed in my office.  The patient was reexamined.  There have been no changes to this history and physical examination.  Marcheta Grammes, DPM

## 2018-01-31 NOTE — Discharge Instructions (Signed)
.These instructions will give you an idea of what to expect after surgery and how to manage issues that may arise before your first post op office visit. ° °Pain Management °Pain is best managed by “staying ahead” of it. If pain gets out of control, it is difficult to get it back under control. Local anesthesia that lasts 6-8 hours is used to numb the foot and decrease pain.  For the best pain control, take the pain medication every 4 hours for the first 2 days post op. On the third day pain medication can be taken as needed.  ° °Post Op Nausea °Nausea is common after surgery, so it is managed proactively.  °If prescribed, use the prescribed nausea medication regularly for the first 2 days post op. ° °Bandages °Do not worry if there is blood on the bandage. What looks like a lot of blood on the bandage is actually a small amount. Blood on the dressing spreads out as it is absorbed by the gauze, the same way a drop of water spreads out on a paper towel.  °If the bandages feel wet or dry, stiff and uncomfortable, call the office during office hours and we will schedule a time for you to have the bandage changed.  °Unless you are specifically told otherwise, we will do the first bandage change in the office.  °Keep your bandage dry. If the bandage becomes wet or soiled, notify the office and we will schedule a time to change the bandage. ° °Activity °It is best to spend most of the first 2 days after surgery lying down with the foot elevated above the level of your heart. °You may put weight on your heel while wearing the surgical shoe.   °You may only get up to go to the restroom. ° °Driving °Do not drive until you are able to respond in an emergency (i.e. slam on the brakes). This usually occurs after the bone has healed - 6 to 8 weeks. ° °Call the Office °If you have a fever over 101°F.  °If you have increasing pain after the initial post op pain has settled down.  °If you have increasing redness, swelling, or  drainage.  °If you have any questions or concerns.  ° ° ° ° °Monitored Anesthesia Care, Care After °These instructions provide you with information about caring for yourself after your procedure. Your health care provider may also give you more specific instructions. Your treatment has been planned according to current medical practices, but problems sometimes occur. Call your health care provider if you have any problems or questions after your procedure. °What can I expect after the procedure? °After your procedure, it is common to: °· Feel sleepy for several hours. °· Feel clumsy and have poor balance for several hours. °· Feel forgetful about what happened after the procedure. °· Have poor judgment for several hours. °· Feel nauseous or vomit. °· Have a sore throat if you had a breathing tube during the procedure. ° °Follow these instructions at home: °For at least 24 hours after the procedure: ° °· Do not: °? Participate in activities in which you could fall or become injured. °? Drive. °? Use heavy machinery. °? Drink alcohol. °? Take sleeping pills or medicines that cause drowsiness. °? Make important decisions or sign legal documents. °? Take care of children on your own. °· Rest. °Eating and drinking °· Follow the diet that is recommended by your health care provider. °· If you vomit, drink water, juice,   or soup when you can drink without vomiting. °· Make sure you have little or no nausea before eating solid foods. °General instructions °· Have a responsible adult stay with you until you are awake and alert. °· Take over-the-counter and prescription medicines only as told by your health care provider. °· If you smoke, do not smoke without supervision. °· Keep all follow-up visits as told by your health care provider. This is important. °Contact a health care provider if: °· You keep feeling nauseous or you keep vomiting. °· You feel light-headed. °· You develop a rash. °· You have a fever. °Get help right  away if: °· You have trouble breathing. °This information is not intended to replace advice given to you by your health care provider. Make sure you discuss any questions you have with your health care provider. °Document Released: 05/30/2015 Document Revised: 09/29/2015 Document Reviewed: 05/30/2015 °Elsevier Interactive Patient Education © 2018 [employer] Inc[employer] ° °

## 2018-01-31 NOTE — Anesthesia Preprocedure Evaluation (Signed)
Anesthesia Evaluation  Patient identified by MRN, date of birth, ID band Patient awake  General Assessment Comment:H/o delayed awakening x1  Reviewed: Allergy & Precautions, NPO status , Patient's Chart, lab work & pertinent test results  Airway Mallampati: II  TM Distance: >3 FB Neck ROM: Full    Dental no notable dental hx. (+) Teeth Intact   Pulmonary sleep apnea ,    Pulmonary exam normal breath sounds clear to auscultation       Cardiovascular Exercise Tolerance: Good hypertension, Pt. on medications negative cardio ROS Normal cardiovascular examI Rhythm:Regular Rate:Normal  + parkinsons - normal ecg   Neuro/Psych negative neurological ROS  negative psych ROS   GI/Hepatic negative GI ROS, Neg liver ROS,   Endo/Other  negative endocrine ROSdiabetes  Renal/GU negative Renal ROS  negative genitourinary   Musculoskeletal  (+) Arthritis , Osteoarthritis,    Abdominal   Peds negative pediatric ROS (+)  Hematology negative hematology ROS (+)   Anesthesia Other Findings   Reproductive/Obstetrics negative OB ROS                             Anesthesia Physical Anesthesia Plan  ASA: II  Anesthesia Plan: MAC   Post-op Pain Management:    Induction: Intravenous  PONV Risk Score and Plan:   Airway Management Planned: Nasal Cannula and Simple Face Mask  Additional Equipment:   Intra-op Plan:   Post-operative Plan:   Informed Consent: I have reviewed the patients History and Physical, chart, labs and discussed the procedure including the risks, benefits and alternatives for the proposed anesthesia with the patient or authorized representative who has indicated his/her understanding and acceptance.   Dental advisory given  Plan Discussed with: CRNA  Anesthesia Plan Comments:         Anesthesia Quick Evaluation

## 2018-01-31 NOTE — Transfer of Care (Signed)
Immediate Anesthesia Transfer of Care Note  Patient: Elijah [name]  Procedure(s) Performed: AMPUTATION RIGHT THIRD TOE (Right Third Toe)  Patient Location: PACU  Anesthesia Type:MAC  Level of Consciousness: awake, alert  and oriented  Airway & Oxygen Therapy: Patient Spontanous Breathing  Post-op Assessment: Report given to RN  Post vital signs: Reviewed and stable  Last Vitals:  Vitals Value Taken Time  BP 133/111 01/31/2018  8:27 AM  Temp    Pulse 56 01/31/2018  8:29 AM  Resp 17 01/31/2018  8:29 AM  SpO2 100 % 01/31/2018  8:29 AM  Vitals shown include unvalidated device data.  Last Pain:  Vitals:   01/31/18 0702  TempSrc: Oral  PainSc:       Patients Stated Pain Goal: 8 (01/77/93 9030)  Complications: No apparent anesthesia complications

## 2018-01-31 NOTE — Brief Op Note (Signed)
BRIEF OPERATIVE NOTE  DATE OF PROCEDURE 01/31/2018  SURGEON Marcheta Grammes, DPM  ASSISTANT SURGEON None  OR STAFF Circulator: Brim, Fransisco Hertz, RN; Glory Rosebush, RN Scrub Person: Lucie Leather, CST   PREOPERATIVE DIAGNOSIS 1.  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  Diabetes mellitus with peripheral neuropathy  POSTOPERATIVE DIAGNOSIS Same  PROCEDURE Amputation of 3rd toe, right foot  ANESTHESIA Monitor Anesthesia Care   HEMOSTASIS Pneumatic ankle tourniquet set at 250 mmHg  ESTIMATED BLOOD LOSS Minimal (<5 cc)  MATERIALS USED None  INJECTABLES 0.5% Marcaine plain  PATHOLOGY 3rd toe, right foot  COMPLICATIONS None

## 2018-01-31 NOTE — Op Note (Signed)
OPERATIVE NOTE  BRIEF OPERATIVE NOTE  DATE OF PROCEDURE 01/31/2018  SURGEON Marcheta Grammes, DPM  ASSISTANT SURGEON None  OR STAFF Circulator: Brim, Fransisco Hertz, RN; Glory Rosebush, RN Scrub Person: Lucie Leather, CST   PREOPERATIVE DIAGNOSIS 1.  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  Diabetes mellitus with peripheral neuropathy  POSTOPERATIVE DIAGNOSIS Same  PROCEDURE Amputation of 3rd toe, right foot  ANESTHESIA Monitor Anesthesia Care   HEMOSTASIS Pneumatic ankle tourniquet set at 250 mmHg  ESTIMATED BLOOD LOSS Minimal (<5 cc)  MATERIALS USED None  INJECTABLES 0.5% Marcaine plain  PATHOLOGY 3rd toe, right foot  COMPLICATIONS None  DESCRIPTION OF THE PROCEDURE:  The patient was brought to the operating room and placed on the operative table in the supine position.  A pneumatic ankle tourniquet was placed about the patient's right ankle.  The foot was anesthetized using 0.5% Marcaine plain.  The foot was scrubbed, prepped and draped in usual sterile manner.  The limb was then elevated and the pneumatic ankle tourniquet inflated to 250 mmHg.    Attention was directed to the right third toe.  2 converging semielliptical incisions were made encompassing the right third toe circumferentially.  Dissection was continued deep down to level of the third metatarsophalangeal joint.  The third metatarsophalangeal joint was disarticulated.  The right third toe was passed from the operative field and sent to pathology for evaluation.  The right third metatarsal head appeared viable wtih no erosive changes.  The remaining soft tissue appeared viable.  The surgical wound was irrigated with amounts of sterile irrigant.  The subcutaneous structures were reapproximated using 4-0 Vicryl.  The skin was reapproximated using 4-0 Nylon.  A sterile compressive dressing was applied to the right foot.  The pneumatic ankle tourniquet was deflated and a pprompt hyperemic response was  noted to all remaining digits of the right foot.    The patient tolerated the procedure well and was transferred from the operating room to the post anesthesia care unit with vital signs stable.

## 2018-02-01 ENCOUNTER — Encounter (HOSPITAL_COMMUNITY): Payer: Self-pay | Admitting: Podiatry

## 2018-02-04 DIAGNOSIS — R2681 Unsteadiness on feet: Secondary | ICD-10-CM | POA: Diagnosis not present

## 2018-02-04 DIAGNOSIS — G20 Parkinson's disease: Secondary | ICD-10-CM | POA: Diagnosis not present

## 2018-02-04 DIAGNOSIS — Z7409 Other reduced mobility: Secondary | ICD-10-CM | POA: Diagnosis not present

## 2018-02-25 DIAGNOSIS — E11621 Type 2 diabetes mellitus with foot ulcer: Secondary | ICD-10-CM | POA: Diagnosis not present

## 2018-02-25 DIAGNOSIS — I10 Essential (primary) hypertension: Secondary | ICD-10-CM | POA: Diagnosis not present

## 2018-02-25 DIAGNOSIS — E119 Type 2 diabetes mellitus without complications: Secondary | ICD-10-CM | POA: Diagnosis not present

## 2018-03-22 DIAGNOSIS — E1142 Type 2 diabetes mellitus with diabetic polyneuropathy: Secondary | ICD-10-CM | POA: Diagnosis not present

## 2018-03-26 ENCOUNTER — Encounter: Payer: Self-pay | Admitting: Neurology

## 2018-03-26 ENCOUNTER — Ambulatory Visit: Payer: PPO | Admitting: Neurology

## 2018-03-26 VITALS — BP 122/77 | HR 68 | Ht 70.0 in | Wt 214.0 lb

## 2018-03-26 DIAGNOSIS — E119 Type 2 diabetes mellitus without complications: Secondary | ICD-10-CM | POA: Diagnosis not present

## 2018-03-26 DIAGNOSIS — I10 Essential (primary) hypertension: Secondary | ICD-10-CM | POA: Diagnosis not present

## 2018-03-26 DIAGNOSIS — E11621 Type 2 diabetes mellitus with foot ulcer: Secondary | ICD-10-CM | POA: Diagnosis not present

## 2018-03-26 DIAGNOSIS — G20 Parkinson's disease: Secondary | ICD-10-CM

## 2018-03-26 DIAGNOSIS — G20A1 Parkinson's disease without dyskinesia, without mention of fluctuations: Secondary | ICD-10-CM

## 2018-03-26 NOTE — Progress Notes (Signed)
Reason for visit: Parkinson's disease  Elijah [name] is an 78 y.o. male  History of present illness:  Elijah [name] is a 78 year old left-handed white male with a history of Parkinson's disease.  The patient is on Sinemet CR taking the 50/200 mg tablets 4 times daily, he is on Mirapex taking 1.5 mg 3 times daily, and he is on selegiline taking 5 mg twice daily.  The patient was increased on the Sinemet taking the 25/100 mg tablet taking 1/2 tablet with each dose of the 50/200 mg CR tablets, but this caused confusion, the patient was taken off of the medication, and he has done better.  The patient walks with a walker, he does have some back pain, he was seen and evaluated by a surgeon was told that he could not have surgery.  He just had 1 of the middle toes on the left foot amputated, he has a history of diabetes with peripheral vascular disease.  He has not had any recent falls, he denies issues with swallowing.  He will have to get up several times a night to use the bathroom, but generally sleeps relatively well.  The patient returns for an evaluation.  He does report some occasional hallucinations but this is not a significant issue for him.   Past Medical History:  Diagnosis Date  . Chronic low back pain 03/24/2015  . Degenerative arthritis   . Diabetes mellitus without complication ([place])   . Dyslipidemia   . Glaucoma   . Gout 10/07/2012  . Hypertension   . Low back pain   . Obesity   . OSA (obstructive sleep apnea) 04/10/2014   cannot tolerate, PCP aware.  . Pancreatitis   . Parkinson disease ([place])   . Rotator cuff tear    Bilateral, no surgery    Past Surgical History:  Procedure Laterality Date  . AMPUTATION Right 01/31/2018   Procedure: AMPUTATION RIGHT THIRD TOE;  Surgeon: Caprice Beaver, DPM;  Location: [facility];  Service: Podiatry;  Laterality: Right;  . APPENDECTOMY    . BALLOON DILATION N/A 09/06/2015   Procedure: BALLOON DILATION;  Surgeon: Daneil Dolin, MD;   Location: [facility];  Service: Endoscopy;  Laterality: N/A;  . BILIARY STENT PLACEMENT N/A 09/08/2015   Procedure: BILIARY STENT PLACEMENT;  Surgeon: Daneil Dolin, MD;  [facility];  Service: Endoscopy;  Laterality: N/A;  . ERCP N/A 09/06/2015   Procedure: ENDOSCOPIC RETROGRADE CHOLANGIOPANCREATOGRAPHY (ERCP);  Surgeon: Daneil Dolin, MD;  [facility];  Service: Endoscopy;  Laterality: N/A;  . ERCP N/A 09/08/2015   Procedure: ENDOSCOPIC RETROGRADE CHOLANGIOPANCREATOGRAPHY (ERCP);  Surgeon: Daneil Dolin, MD;  [facility];  Service: Endoscopy;  Laterality: N/A;  with bile duct brushings  . HERNIA REPAIR     umbilical  . SHOULDER BONE SPUR Left    RESECTION  . SPHINCTEROTOMY N/A 09/06/2015   Procedure: SPHINCTEROTOMY;  Surgeon: Daneil Dolin, MD;  [facility];  Service: Endoscopy;  Laterality: N/A;  . TONSILLECTOMY      Family History  Problem Relation Age of Onset  . Stroke Mother   . Diabetes Mother   . Heart Problems Mother   . Parkinsonism Father   . Heart Problems Father     Social history:  reports that he has never smoked. He has never used smokeless tobacco. He reports that he does not drink alcohol or use drugs.    Allergies  Allergen Reactions  .  Aspirin Other (See Comments)    Stomach ulcers.  . Coconut Oil     Other reaction(s): GI Upset (intolerance)  . Penicillins Rash    Has patient had a PCN reaction causing immediate rash, facial/tongue/throat swelling, SOB or lightheadedness with hypotension: no Has patient had a PCN reaction causing severe rash involving mucus membranes or skin necrosis: No Has patient had a PCN reaction that required hospitalization No Has patient had a PCN reaction occurring within the last 10 years: No If all of the above answers are "NO", then may proceed with Cephalosporin use.     Medications:  Prior to Admission medications   Medication Sig Start Date End Date Taking? Authorizing  Provider  ACCU-CHEK AVIVA PLUS test strip  12/05/13  Yes [provider]  aspirin 81 MG tablet Take 81 mg by mouth daily.   Yes [provider]  Azelastine HCl 0.15 % SOLN Place 1 spray into both nostrils daily as needed (congestion).  08/18/15  Yes [provider]  carbidopa-levodopa (SINEMET CR) 50-200 MG tablet TAKE (1) TABLET BY MOUTH (4) TIMES DAILY. Patient taking differently: Take 1 tablet by mouth 4 (four) times daily. TAKE (1) TABLET BY MOUTH (4) TIMES DAILY. 11/29/17  Yes Kathrynn Ducking, MD  Carboxymethylcellul-Glycerin (REFRESH OPTIVE) 1-0.9 % GEL Place 1 drop into both eyes daily as needed (dry eyes).   Yes [provider]  cyclobenzaprine (FLEXERIL) 10 MG tablet Take 1 tablet (10 mg total) by mouth 2 (two) times daily as needed for muscle spasms. Patient taking differently: Take 10 mg by mouth 3 (three) times daily as needed for muscle spasms.  07/02/12  Yes Teressa Lower, MD  doxycycline (VIBRA-TABS) 100 MG tablet Take 100 mg by mouth 2 (two) times daily. 01/16/18  Yes [provider]  furosemide (LASIX) 20 MG tablet Take 20 mg by mouth daily as needed for fluid or edema.  10/02/13  Yes [provider]  LORazepam (ATIVAN) 1 MG tablet Take 1 mg by mouth daily. 12/31/17  Yes [provider]  meloxicam (MOBIC) 15 MG tablet Take 15 mg by mouth daily as needed for pain.  09/30/12  Yes [provider]  metFORMIN (GLUCOPHAGE) 500 MG tablet Take 500 mg by mouth 2 (two) times daily with a meal.  10/05/12  Yes [provider]  metoprolol tartrate (LOPRESSOR) 25 MG tablet Take 12.5 mg by mouth daily.    Yes [provider]  pantoprazole (PROTONIX) 40 MG tablet Take 40 mg by mouth daily. 03/08/14  Yes [provider]  potassium chloride SA (K-DUR,KLOR-CON) 20 MEQ tablet Take 20 mEq by mouth daily.  10/24/13  Yes [provider]  pramipexole (MIRAPEX) 1.5 MG tablet TAKE (1) TABLET BY MOUTH (3)  TIMES DAILY. Patient taking differently: Take 1.5 mg by mouth 3 (three) times daily. TAKE (1) TABLET BY MOUTH (3) TIMES DAILY. 11/29/17  Yes Kathrynn Ducking, MD  SALINE MIST SPRAY NA Place 1 spray into both nostrils daily as needed (congestion).    Yes [provider]  selegiline (ELDEPRYL) 5 MG tablet Take 1 tablet (5 mg total) by mouth 2 (two) times daily with a meal. 11/29/17  Yes Kathrynn Ducking, MD  simvastatin (ZOCOR) 40 MG tablet Take 20 mg by mouth at bedtime.  09/05/12  Yes [provider]  valsartan-hydrochlorothiazide (DIOVAN-HCT) 80-12.5 MG per tablet Take 1 tablet by mouth daily.   Yes [provider]    ROS:  Out of a complete 14 system  review of symptoms, the patient complains only of the following symptoms, and all other reviewed systems are negative.  Joint pain, back pain, walking difficulty  Blood pressure 122/77, pulse 68, height 5\' 10"  (1.778 m), weight 214 lb (97.1 kg).  Physical Exam  General: The patient is alert and cooperative at the time of the examination.  The patient is moderately obese.  Skin: No significant peripheral edema is noted.   Neurologic Exam  Mental status: The patient is alert and oriented x 3 at the time of the examination. The patient has apparent normal recent and remote memory, with an apparently normal attention span and concentration ability.   Cranial nerves: Facial symmetry is present. Speech is normal, no aphasia or dysarthria is noted. Extraocular movements are full. Visual fields are full.  Some masking of the face is seen.  Motor: The patient has good strength in all 4 extremities.  Sensory examination: Soft touch sensation is symmetric on the face, arms, and legs.  Coordination: The patient has good finger-nose-finger and heel-to-shin bilaterally.  Gait and station: The patient will walk with a walker.  The patient has a stooped posture.  The patient has a negative Romberg, tandem gait was not  attempted.  Reflexes: Deep tendon reflexes are symmetric.   Assessment/Plan:  1.  Parkinson's disease  The patient had significant confusion with an increase in the Sinemet, the Sinemet is to be increased in the future, the Mirapex dose may need to be reduced.  The patient will remain on the current dose of medication, he will follow-up in 5 months.   Jill Alexanders MD 03/26/2018 10:45 AM  [facility] [address] [place], [place] [address]  Phone [phone] Fax [phone]

## 2018-04-11 DIAGNOSIS — H9202 Otalgia, left ear: Secondary | ICD-10-CM | POA: Diagnosis not present

## 2018-04-11 DIAGNOSIS — H6992 Unspecified Eustachian tube disorder, left ear: Secondary | ICD-10-CM | POA: Diagnosis not present

## 2018-04-16 DIAGNOSIS — H9313 Tinnitus, bilateral: Secondary | ICD-10-CM | POA: Diagnosis not present

## 2018-05-15 DIAGNOSIS — E119 Type 2 diabetes mellitus without complications: Secondary | ICD-10-CM | POA: Diagnosis not present

## 2018-05-15 DIAGNOSIS — E782 Mixed hyperlipidemia: Secondary | ICD-10-CM | POA: Diagnosis not present

## 2018-05-15 DIAGNOSIS — E11621 Type 2 diabetes mellitus with foot ulcer: Secondary | ICD-10-CM | POA: Diagnosis not present

## 2018-05-15 DIAGNOSIS — I10 Essential (primary) hypertension: Secondary | ICD-10-CM | POA: Diagnosis not present

## 2018-05-20 DIAGNOSIS — G894 Chronic pain syndrome: Secondary | ICD-10-CM | POA: Diagnosis not present

## 2018-05-20 DIAGNOSIS — M1991 Primary osteoarthritis, unspecified site: Secondary | ICD-10-CM | POA: Diagnosis not present

## 2018-05-20 DIAGNOSIS — E785 Hyperlipidemia, unspecified: Secondary | ICD-10-CM | POA: Diagnosis not present

## 2018-05-20 DIAGNOSIS — E1169 Type 2 diabetes mellitus with other specified complication: Secondary | ICD-10-CM | POA: Diagnosis not present

## 2018-05-20 DIAGNOSIS — G20 Parkinson's disease: Secondary | ICD-10-CM | POA: Diagnosis not present

## 2018-05-20 DIAGNOSIS — M545 Low back pain: Secondary | ICD-10-CM | POA: Diagnosis not present

## 2018-05-20 DIAGNOSIS — I10 Essential (primary) hypertension: Secondary | ICD-10-CM | POA: Diagnosis not present

## 2018-05-20 DIAGNOSIS — Z89421 Acquired absence of other right toe(s): Secondary | ICD-10-CM | POA: Diagnosis not present

## 2018-05-20 DIAGNOSIS — K839 Disease of biliary tract, unspecified: Secondary | ICD-10-CM | POA: Diagnosis not present

## 2018-05-22 DIAGNOSIS — I10 Essential (primary) hypertension: Secondary | ICD-10-CM | POA: Diagnosis not present

## 2018-05-22 DIAGNOSIS — E782 Mixed hyperlipidemia: Secondary | ICD-10-CM | POA: Diagnosis not present

## 2018-05-22 DIAGNOSIS — G20 Parkinson's disease: Secondary | ICD-10-CM | POA: Diagnosis not present

## 2018-05-22 DIAGNOSIS — E119 Type 2 diabetes mellitus without complications: Secondary | ICD-10-CM | POA: Diagnosis not present

## 2018-06-03 DIAGNOSIS — Z0000 Encounter for general adult medical examination without abnormal findings: Secondary | ICD-10-CM | POA: Diagnosis not present

## 2018-08-01 ENCOUNTER — Other Ambulatory Visit (HOSPITAL_COMMUNITY): Payer: Self-pay | Admitting: Nurse Practitioner

## 2018-08-01 ENCOUNTER — Other Ambulatory Visit: Payer: Self-pay

## 2018-08-01 ENCOUNTER — Ambulatory Visit (HOSPITAL_COMMUNITY)
Admission: RE | Admit: 2018-08-01 | Discharge: 2018-08-01 | Disposition: A | Discharge status: Home / Self Care | Payer: PPO | Source: Ambulatory Visit | Attending: Nurse Practitioner | Admitting: Nurse Practitioner

## 2018-08-01 DIAGNOSIS — R0789 Other chest pain: Secondary | ICD-10-CM | POA: Diagnosis not present

## 2018-08-01 DIAGNOSIS — Z9181 History of falling: Secondary | ICD-10-CM

## 2018-08-01 DIAGNOSIS — S301XXA Contusion of abdominal wall, initial encounter: Secondary | ICD-10-CM | POA: Diagnosis not present

## 2018-08-01 DIAGNOSIS — R079 Chest pain, unspecified: Secondary | ICD-10-CM | POA: Diagnosis not present

## 2018-08-01 DIAGNOSIS — R0782 Intercostal pain: Secondary | ICD-10-CM | POA: Diagnosis not present

## 2018-08-01 DIAGNOSIS — R0602 Shortness of breath: Secondary | ICD-10-CM | POA: Diagnosis not present

## 2018-08-01 DIAGNOSIS — W19XXXA Unspecified fall, initial encounter: Secondary | ICD-10-CM | POA: Diagnosis not present

## 2018-08-05 DIAGNOSIS — G20 Parkinson's disease: Secondary | ICD-10-CM | POA: Diagnosis not present

## 2018-08-05 DIAGNOSIS — E11621 Type 2 diabetes mellitus with foot ulcer: Secondary | ICD-10-CM | POA: Diagnosis not present

## 2018-08-05 DIAGNOSIS — I10 Essential (primary) hypertension: Secondary | ICD-10-CM | POA: Diagnosis not present

## 2018-08-05 DIAGNOSIS — M1990 Unspecified osteoarthritis, unspecified site: Secondary | ICD-10-CM | POA: Diagnosis not present

## 2018-08-05 DIAGNOSIS — E782 Mixed hyperlipidemia: Secondary | ICD-10-CM | POA: Diagnosis not present

## 2018-08-05 DIAGNOSIS — K828 Other specified diseases of gallbladder: Secondary | ICD-10-CM | POA: Diagnosis not present

## 2018-08-05 DIAGNOSIS — E119 Type 2 diabetes mellitus without complications: Secondary | ICD-10-CM | POA: Diagnosis not present

## 2018-08-09 DIAGNOSIS — L603 Nail dystrophy: Secondary | ICD-10-CM | POA: Diagnosis not present

## 2018-08-09 DIAGNOSIS — E1142 Type 2 diabetes mellitus with diabetic polyneuropathy: Secondary | ICD-10-CM | POA: Diagnosis not present

## 2018-08-20 DIAGNOSIS — J019 Acute sinusitis, unspecified: Secondary | ICD-10-CM | POA: Diagnosis not present

## 2018-09-09 ENCOUNTER — Ambulatory Visit (INDEPENDENT_AMBULATORY_CARE_PROVIDER_SITE_OTHER): Payer: PPO | Admitting: Otolaryngology

## 2018-09-09 ENCOUNTER — Other Ambulatory Visit: Payer: Self-pay

## 2018-09-09 DIAGNOSIS — H9042 Sensorineural hearing loss, unilateral, left ear, with unrestricted hearing on the contralateral side: Secondary | ICD-10-CM | POA: Diagnosis not present

## 2018-09-09 DIAGNOSIS — H9312 Tinnitus, left ear: Secondary | ICD-10-CM

## 2018-09-17 DIAGNOSIS — G20 Parkinson's disease: Secondary | ICD-10-CM | POA: Diagnosis not present

## 2018-09-17 DIAGNOSIS — E11621 Type 2 diabetes mellitus with foot ulcer: Secondary | ICD-10-CM | POA: Diagnosis not present

## 2018-09-17 DIAGNOSIS — E119 Type 2 diabetes mellitus without complications: Secondary | ICD-10-CM | POA: Diagnosis not present

## 2018-09-17 DIAGNOSIS — M1990 Unspecified osteoarthritis, unspecified site: Secondary | ICD-10-CM | POA: Diagnosis not present

## 2018-09-17 DIAGNOSIS — I10 Essential (primary) hypertension: Secondary | ICD-10-CM | POA: Diagnosis not present

## 2018-09-17 DIAGNOSIS — K828 Other specified diseases of gallbladder: Secondary | ICD-10-CM | POA: Diagnosis not present

## 2018-09-17 DIAGNOSIS — E782 Mixed hyperlipidemia: Secondary | ICD-10-CM | POA: Diagnosis not present

## 2018-09-28 ENCOUNTER — Other Ambulatory Visit (HOSPITAL_COMMUNITY): Payer: Self-pay | Admitting: Nurse Practitioner

## 2018-09-28 ENCOUNTER — Other Ambulatory Visit: Payer: Self-pay

## 2018-09-28 ENCOUNTER — Ambulatory Visit (HOSPITAL_COMMUNITY)
Admission: RE | Admit: 2018-09-28 | Discharge: 2018-09-28 | Disposition: A | Discharge status: Home / Self Care | Payer: PPO | Source: Ambulatory Visit | Attending: Nurse Practitioner | Admitting: Nurse Practitioner

## 2018-09-28 DIAGNOSIS — M25551 Pain in right hip: Secondary | ICD-10-CM | POA: Diagnosis not present

## 2018-09-28 DIAGNOSIS — M1611 Unilateral primary osteoarthritis, right hip: Secondary | ICD-10-CM | POA: Diagnosis not present

## 2018-10-18 DIAGNOSIS — E119 Type 2 diabetes mellitus without complications: Secondary | ICD-10-CM | POA: Diagnosis not present

## 2018-10-18 DIAGNOSIS — K828 Other specified diseases of gallbladder: Secondary | ICD-10-CM | POA: Diagnosis not present

## 2018-10-18 DIAGNOSIS — L603 Nail dystrophy: Secondary | ICD-10-CM | POA: Diagnosis not present

## 2018-10-18 DIAGNOSIS — M1990 Unspecified osteoarthritis, unspecified site: Secondary | ICD-10-CM | POA: Diagnosis not present

## 2018-10-18 DIAGNOSIS — E1142 Type 2 diabetes mellitus with diabetic polyneuropathy: Secondary | ICD-10-CM | POA: Diagnosis not present

## 2018-10-18 DIAGNOSIS — E782 Mixed hyperlipidemia: Secondary | ICD-10-CM | POA: Diagnosis not present

## 2018-10-18 DIAGNOSIS — E11621 Type 2 diabetes mellitus with foot ulcer: Secondary | ICD-10-CM | POA: Diagnosis not present

## 2018-10-18 DIAGNOSIS — I10 Essential (primary) hypertension: Secondary | ICD-10-CM | POA: Diagnosis not present

## 2018-10-18 DIAGNOSIS — G20 Parkinson's disease: Secondary | ICD-10-CM | POA: Diagnosis not present

## 2018-10-21 ENCOUNTER — Ambulatory Visit: Payer: PPO | Admitting: Neurology

## 2018-10-21 ENCOUNTER — Encounter: Payer: Self-pay | Admitting: Neurology

## 2018-10-21 ENCOUNTER — Other Ambulatory Visit: Payer: Self-pay

## 2018-10-21 VITALS — BP 145/71 | HR 57 | Temp 96.9°F | Ht 66.0 in | Wt 203.0 lb

## 2018-10-21 DIAGNOSIS — G20 Parkinson's disease: Secondary | ICD-10-CM | POA: Diagnosis not present

## 2018-10-21 NOTE — Progress Notes (Signed)
Reason for visit: Parkinson's disease  Elijah [name] is an 78 y.o. male  History of present illness:  Elijah [name] is a 78 year old left-handed white male with a history of Parkinson's disease and a gait disorder.  The patient uses a walker for ambulation, he did have one fall several weeks ago while in the garden but otherwise he has done well.  Occasionally he may feel that the right foot may be glued to the floor.  He is now starting to talk in his sleep, he will have occasional hallucinations but these are not frequent or upsetting to him.  He denies issues with swallowing.  The patient had confusion when the Sinemet dose was increased previously, he remains on selegiline, Mirapex, and Sinemet CR.  He tries to remain active.  Past Medical History:  Diagnosis Date  . Chronic low back pain 03/24/2015  . Degenerative arthritis   . Diabetes mellitus without complication ([place])   . Dyslipidemia   . Glaucoma   . Gout 10/07/2012  . Hypertension   . Low back pain   . Obesity   . OSA (obstructive sleep apnea) 04/10/2014   cannot tolerate, PCP aware.  . Pancreatitis   . Parkinson disease ([place])   . Rotator cuff tear    Bilateral, no surgery    Past Surgical History:  Procedure Laterality Date  . AMPUTATION Right 01/31/2018   Procedure: AMPUTATION RIGHT THIRD TOE;  Surgeon: Caprice Beaver, DPM;  Location: [facility];  Service: Podiatry;  Laterality: Right;  . APPENDECTOMY    . BALLOON DILATION N/A 09/06/2015   Procedure: BALLOON DILATION;  Surgeon: Daneil Dolin, MD;  [facility];  Service: Endoscopy;  Laterality: N/A;  . BILIARY STENT PLACEMENT N/A 09/08/2015   Procedure: BILIARY STENT PLACEMENT;  Surgeon: Daneil Dolin, MD;  [facility];  Service: Endoscopy;  Laterality: N/A;  . ERCP N/A 09/06/2015   Procedure: ENDOSCOPIC RETROGRADE CHOLANGIOPANCREATOGRAPHY (ERCP);  Surgeon: Daneil Dolin, MD;  [facility];  Service: Endoscopy;  Laterality: N/A;  .  ERCP N/A 09/08/2015   Procedure: ENDOSCOPIC RETROGRADE CHOLANGIOPANCREATOGRAPHY (ERCP);  Surgeon: Daneil Dolin, MD;  [facility];  Service: Endoscopy;  Laterality: N/A;  with bile duct brushings  . HERNIA REPAIR     umbilical  . SHOULDER BONE SPUR Left    RESECTION  . SPHINCTEROTOMY N/A 09/06/2015   Procedure: SPHINCTEROTOMY;  Surgeon: Daneil Dolin, MD;  [facility];  Service: Endoscopy;  Laterality: N/A;  . TONSILLECTOMY      Family History  Problem Relation Age of Onset  . Stroke Mother   . Diabetes Mother   . Heart Problems Mother   . Parkinsonism Father   . Heart Problems Father     Social history:  reports that he has never smoked. He has never used smokeless tobacco. He reports that he does not drink alcohol or use drugs.    Allergies  Allergen Reactions  . Aspirin Other (See Comments)    Stomach ulcers.  . Coconut Oil     Other reaction(s): GI Upset (intolerance)  . Penicillins Rash    Has patient had a PCN reaction causing immediate rash, facial/tongue/throat swelling, SOB or lightheadedness with hypotension: no Has patient had a PCN reaction causing severe rash involving mucus membranes or skin necrosis: No Has patient had a PCN reaction that required hospitalization No Has patient had a PCN reaction occurring within the last 10 years: No If  all of the above answers are "NO", then may proceed with Cephalosporin use.     Medications:  Prior to Admission medications   Medication Sig Start Date End Date Taking? Authorizing Provider  ACCU-CHEK AVIVA PLUS test strip  12/05/13  Yes [provider]  aspirin 81 MG tablet Take 81 mg by mouth daily.   Yes [provider]  Azelastine HCl 0.15 % SOLN Place 1 spray into both nostrils daily as needed (congestion).  08/18/15  Yes [provider]  carbidopa-levodopa (SINEMET CR) 50-200 MG tablet TAKE (1) TABLET BY MOUTH (4) TIMES DAILY. Patient taking differently: Take 1 tablet  by mouth 4 (four) times daily. TAKE (1) TABLET BY MOUTH (4) TIMES DAILY. 11/29/17  Yes Kathrynn Ducking, MD  Carboxymethylcellul-Glycerin (REFRESH OPTIVE) 1-0.9 % GEL Place 1 drop into both eyes daily as needed (dry eyes).   Yes [provider]  cyclobenzaprine (FLEXERIL) 10 MG tablet Take 1 tablet (10 mg total) by mouth 2 (two) times daily as needed for muscle spasms. Patient taking differently: Take 10 mg by mouth 3 (three) times daily as needed for muscle spasms.  07/02/12  Yes Teressa Lower, MD  doxycycline (VIBRA-TABS) 100 MG tablet Take 100 mg by mouth 2 (two) times daily. 01/16/18  Yes [provider]  furosemide (LASIX) 20 MG tablet Take 20 mg by mouth daily as needed for fluid or edema.  10/02/13  Yes [provider]  LORazepam (ATIVAN) 1 MG tablet Take 1 mg by mouth daily. 12/31/17  Yes [provider]  meloxicam (MOBIC) 15 MG tablet Take 15 mg by mouth daily as needed for pain.  09/30/12  Yes [provider]  metFORMIN (GLUCOPHAGE) 500 MG tablet Take 500 mg by mouth 2 (two) times daily with a meal.  10/05/12  Yes [provider]  metoprolol tartrate (LOPRESSOR) 25 MG tablet Take 12.5 mg by mouth daily.    Yes [provider]  pantoprazole (PROTONIX) 40 MG tablet Take 40 mg by mouth daily. 03/08/14  Yes [provider]  potassium chloride SA (K-DUR,KLOR-CON) 20 MEQ tablet Take 20 mEq by mouth daily.  10/24/13  Yes [provider]  pramipexole (MIRAPEX) 1.5 MG tablet TAKE (1) TABLET BY MOUTH (3) TIMES DAILY. Patient taking differently: Take 1.5 mg by mouth 3 (three) times daily. TAKE (1) TABLET BY MOUTH (3) TIMES DAILY. 11/29/17  Yes Kathrynn Ducking, MD  SALINE MIST SPRAY NA Place 1 spray into both nostrils daily as needed (congestion).    Yes [provider]  selegiline (ELDEPRYL) 5 MG tablet Take 1 tablet (5 mg total) by mouth 2 (two) times daily with a meal. 11/29/17  Yes Kathrynn Ducking, MD   simvastatin (ZOCOR) 40 MG tablet Take 20 mg by mouth at bedtime.  09/05/12  Yes [provider]  valsartan-hydrochlorothiazide (DIOVAN-HCT) 80-12.5 MG per tablet Take 1 tablet by mouth daily.   Yes [provider]    ROS:  Out of a complete 14 system review of symptoms, the patient complains only of the following symptoms, and all other reviewed systems are negative.  Frequency of urination Walking problems Talking in sleep  Blood pressure (!) 145/71, pulse (!) 57, temperature (!) 96.9 F (36.1 C), temperature source Temporal, height 5\' 6"  (1.676 m), weight 203 lb (92.1 kg).  Physical Exam  General: The patient is alert and cooperative at the time of the examination.  The patient is moderately obese.  Skin: No significant peripheral edema is noted.  Neurologic Exam  Mental status: The patient is alert and oriented x 3 at the time of the examination. The patient has apparent normal recent and remote memory, with an apparently normal attention span and concentration ability.   Cranial nerves: Facial symmetry is present. Speech is normal, no aphasia or dysarthria is noted. Extraocular movements are full. Visual fields are full.  Motor: The patient has good strength in all 4 extremities.  Sensory examination: Soft touch sensation is symmetric on the face, arms, and legs.  Coordination: The patient has good finger-nose-finger and heel-to-shin bilaterally.  Gait and station: The patient is able to walk with a walker, he is quite kyphotic and leans to the right.  He takes good strides with a walker.  Tandem gait is not attempted.  Romberg is negative.  Reflexes: Deep tendon reflexes are symmetric.   Assessment/Plan:  1.  Parkinson's disease  2.  Gait disorder  3.  Occasional hallucinations  The patient will continue his current medications, in the future we may go up on the Sinemet by reducing the Mirapex dosing.  He will let me know if his mobility is  significantly declining.  He will follow-up in 5 months.  Jill Alexanders MD 10/21/2018 12:08 PM  [facility] [address] [place], [place] [address]  Phone [phone] Fax [phone]

## 2018-10-31 ENCOUNTER — Telehealth: Payer: Self-pay | Admitting: Neurology

## 2018-10-31 MED ORDER — PRAMIPEXOLE DIHYDROCHLORIDE 1 MG PO TABS: 1.0000 mg | ORAL_TABLET | Freq: Three times a day (TID) | ORAL | 2 refills | Status: DC

## 2018-10-31 NOTE — Telephone Encounter (Signed)
I reached out to the pt. He reports over the last week he has been unable to sleep and his visual hallucinations have increased. Pt confirmed he is currently taking Sinemet 50/200 CR 1 tab 4 per day and Mirapex 1 tab tid. Pt is wanting to know if any med adjustments should be made?

## 2018-10-31 NOTE — Telephone Encounter (Signed)
I called the patient.  The patient has had increased hallucinations over the last 3 days.  No changes in medications have been noted.  He does take an occasional Flexeril, but this is not taken on a regular basis.  I will reduce the Mirapex taking 1 mg 3 times daily, if the hallucinations continue we will add Seroquel.

## 2018-10-31 NOTE — Telephone Encounter (Signed)
Pt called and the call was barely understandable but the pt is needing to discuss his carbidopa-levodopa (SINEMET CR) 50-200 MG tablet with the RN. Please advise.

## 2018-11-04 ENCOUNTER — Telehealth: Payer: Self-pay | Admitting: Neurology

## 2018-11-04 MED ORDER — QUETIAPINE FUMARATE 25 MG PO TABS: 25.0000 mg | ORAL_TABLET | Freq: Every day | ORAL | 1 refills | Status: DC

## 2018-11-04 NOTE — Telephone Encounter (Signed)
Pt called stating that he is needing an adjustment on his medications because one of them, he could not remember the name at the time, keeps him up at night and he is seeing things that no one else is seeing. Please advise.

## 2018-11-04 NOTE — Telephone Encounter (Signed)
I called the patient.  The patient is not sleeping well at night, he is having some hallucinations in the evening, he is nervous and anxious.  I will send in a prescription for low-dose Seroquel to be taken at nighttime to help him rest and to suppress the hallucinations.  He will call for any dose adjustments.

## 2018-11-05 MED ORDER — PRAMIPEXOLE DIHYDROCHLORIDE 1 MG PO TABS: ORAL_TABLET | ORAL | 2 refills | Status: DC

## 2018-11-05 NOTE — Telephone Encounter (Signed)
Pt's wife Vaughan Basta (on [place]) called. She thought [facility] was missing the prescription of Seroquel. I informed her [facility] had it. She will pick up the medication to start tonight, one hour before bedtime. She said last night the pt didn't know where he was. He wanted to go home even though he was there. He hallucinated that there were people in the kitchen eating in a cafeteria and saw children in the home. She asked if Dr. Jannifer Franklin still would call her again if possible d/t her concerns. I reassured her that the Seroquel may help with his symptoms and that I would send a message to Dr. Jannifer Franklin.

## 2018-11-05 NOTE — Addendum Note (Signed)
Addended by: Kathrynn Ducking on: 11/05/2018 10:34 AM   Modules accepted: Orders

## 2018-11-05 NOTE — Telephone Encounter (Signed)
I called the wife.  They did not get the Seroquel last evening, they will start the medication this evening.  We will cut back on the Mirapex taking 1 mg in the morning and midday, none in the afternoon or evening.  The patient is clearly sundowning, he is getting confused in the evenings does not recognize his own home.  He is having ongoing hallucinations.  If he continues to have issues, will need to get him in for blood work and urinalysis.

## 2018-11-06 DIAGNOSIS — G20 Parkinson's disease: Secondary | ICD-10-CM | POA: Diagnosis not present

## 2018-11-06 DIAGNOSIS — R4181 Age-related cognitive decline: Secondary | ICD-10-CM | POA: Diagnosis not present

## 2018-11-06 DIAGNOSIS — R443 Hallucinations, unspecified: Secondary | ICD-10-CM | POA: Diagnosis not present

## 2018-11-11 DIAGNOSIS — I10 Essential (primary) hypertension: Secondary | ICD-10-CM | POA: Diagnosis not present

## 2018-11-11 DIAGNOSIS — E11621 Type 2 diabetes mellitus with foot ulcer: Secondary | ICD-10-CM | POA: Diagnosis not present

## 2018-11-11 DIAGNOSIS — E785 Hyperlipidemia, unspecified: Secondary | ICD-10-CM | POA: Diagnosis not present

## 2018-11-11 DIAGNOSIS — E782 Mixed hyperlipidemia: Secondary | ICD-10-CM | POA: Diagnosis not present

## 2018-11-11 DIAGNOSIS — E1169 Type 2 diabetes mellitus with other specified complication: Secondary | ICD-10-CM | POA: Diagnosis not present

## 2018-11-14 DIAGNOSIS — Z0000 Encounter for general adult medical examination without abnormal findings: Secondary | ICD-10-CM | POA: Diagnosis not present

## 2018-11-15 DIAGNOSIS — E782 Mixed hyperlipidemia: Secondary | ICD-10-CM | POA: Diagnosis not present

## 2018-11-15 DIAGNOSIS — R443 Hallucinations, unspecified: Secondary | ICD-10-CM | POA: Diagnosis not present

## 2018-11-15 DIAGNOSIS — M545 Low back pain: Secondary | ICD-10-CM | POA: Diagnosis not present

## 2018-11-15 DIAGNOSIS — Z89421 Acquired absence of other right toe(s): Secondary | ICD-10-CM | POA: Diagnosis not present

## 2018-11-15 DIAGNOSIS — K839 Disease of biliary tract, unspecified: Secondary | ICD-10-CM | POA: Diagnosis not present

## 2018-11-15 DIAGNOSIS — G20 Parkinson's disease: Secondary | ICD-10-CM | POA: Diagnosis not present

## 2018-11-15 DIAGNOSIS — G894 Chronic pain syndrome: Secondary | ICD-10-CM | POA: Diagnosis not present

## 2018-11-15 DIAGNOSIS — G4701 Insomnia due to medical condition: Secondary | ICD-10-CM | POA: Diagnosis not present

## 2018-11-15 DIAGNOSIS — E1169 Type 2 diabetes mellitus with other specified complication: Secondary | ICD-10-CM | POA: Diagnosis not present

## 2018-11-15 DIAGNOSIS — I10 Essential (primary) hypertension: Secondary | ICD-10-CM | POA: Diagnosis not present

## 2018-11-15 DIAGNOSIS — M1991 Primary osteoarthritis, unspecified site: Secondary | ICD-10-CM | POA: Diagnosis not present

## 2018-11-28 MED ORDER — QUETIAPINE FUMARATE 25 MG PO TABS: 50.0000 mg | ORAL_TABLET | Freq: Every day | ORAL | 2 refills | Status: DC

## 2018-11-28 NOTE — Addendum Note (Signed)
Addended by: Kathrynn Ducking on: 11/28/2018 05:32 PM   Modules accepted: Orders
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
## 2018-11-28 NOTE — Telephone Encounter (Signed)
Pts wife Elijah [name] called in and stated pt is having hallucinations still and she would ike to speak with someone about it

## 2018-12-13 ENCOUNTER — Other Ambulatory Visit (HOSPITAL_COMMUNITY): Payer: Self-pay | Admitting: Internal Medicine

## 2018-12-13 DIAGNOSIS — R443 Hallucinations, unspecified: Secondary | ICD-10-CM

## 2018-12-13 DIAGNOSIS — R4182 Altered mental status, unspecified: Secondary | ICD-10-CM

## 2018-12-27 DIAGNOSIS — E1142 Type 2 diabetes mellitus with diabetic polyneuropathy: Secondary | ICD-10-CM | POA: Diagnosis not present

## 2018-12-27 DIAGNOSIS — L603 Nail dystrophy: Secondary | ICD-10-CM | POA: Diagnosis not present

## 2019-01-01 ENCOUNTER — Ambulatory Visit (HOSPITAL_COMMUNITY)
Admission: RE | Admit: 2019-01-01 | Discharge: 2019-01-01 | Disposition: A | Discharge status: Home / Self Care | Payer: PPO | Source: Ambulatory Visit | Attending: Internal Medicine | Admitting: Internal Medicine

## 2019-01-01 ENCOUNTER — Other Ambulatory Visit: Payer: Self-pay

## 2019-01-01 DIAGNOSIS — R4182 Altered mental status, unspecified: Secondary | ICD-10-CM | POA: Diagnosis not present

## 2019-01-01 DIAGNOSIS — R443 Hallucinations, unspecified: Secondary | ICD-10-CM | POA: Diagnosis not present

## 2019-01-01 LAB — POCT I-STAT CREATININE: Creatinine, Ser: 0.9 mg/dL (ref 0.61–1.24)

## 2019-01-01 MED ORDER — IOHEXOL 300 MG/ML  SOLN
75.0000 mL | Freq: Once | INTRAMUSCULAR | Status: AC | PRN
  Administered 2019-01-01: 75 mL via INTRAVENOUS

## 2019-01-31 DIAGNOSIS — G20 Parkinson's disease: Secondary | ICD-10-CM | POA: Diagnosis not present

## 2019-01-31 DIAGNOSIS — E782 Mixed hyperlipidemia: Secondary | ICD-10-CM | POA: Diagnosis not present

## 2019-01-31 DIAGNOSIS — E119 Type 2 diabetes mellitus without complications: Secondary | ICD-10-CM | POA: Diagnosis not present

## 2019-01-31 DIAGNOSIS — K828 Other specified diseases of gallbladder: Secondary | ICD-10-CM | POA: Diagnosis not present

## 2019-01-31 DIAGNOSIS — I10 Essential (primary) hypertension: Secondary | ICD-10-CM | POA: Diagnosis not present

## 2019-01-31 DIAGNOSIS — M1990 Unspecified osteoarthritis, unspecified site: Secondary | ICD-10-CM | POA: Diagnosis not present

## 2019-01-31 DIAGNOSIS — E11621 Type 2 diabetes mellitus with foot ulcer: Secondary | ICD-10-CM | POA: Diagnosis not present

## 2019-02-03 ENCOUNTER — Other Ambulatory Visit: Payer: Self-pay | Admitting: Neurology

## 2019-02-03 ENCOUNTER — Telehealth: Payer: Self-pay | Admitting: Neurology

## 2019-02-03 MED ORDER — QUETIAPINE FUMARATE 25 MG PO TABS: 50.0000 mg | ORAL_TABLET | Freq: Every day | ORAL | 2 refills | Status: DC

## 2019-02-03 NOTE — Telephone Encounter (Signed)
Refill has been sent.  °

## 2019-02-03 NOTE — Telephone Encounter (Signed)
Pt is requesting a refill of QUEtiapine (SEROQUEL) 25 MG tablet  , to be sent to [place], [place] - [address]

## 2019-02-04 ENCOUNTER — Other Ambulatory Visit: Payer: Self-pay | Admitting: Neurology

## 2019-02-25 DIAGNOSIS — G20 Parkinson's disease: Secondary | ICD-10-CM | POA: Diagnosis not present

## 2019-02-25 DIAGNOSIS — K828 Other specified diseases of gallbladder: Secondary | ICD-10-CM | POA: Diagnosis not present

## 2019-02-25 DIAGNOSIS — I10 Essential (primary) hypertension: Secondary | ICD-10-CM | POA: Diagnosis not present

## 2019-02-25 DIAGNOSIS — E782 Mixed hyperlipidemia: Secondary | ICD-10-CM | POA: Diagnosis not present

## 2019-02-25 DIAGNOSIS — E119 Type 2 diabetes mellitus without complications: Secondary | ICD-10-CM | POA: Diagnosis not present

## 2019-02-25 DIAGNOSIS — M1990 Unspecified osteoarthritis, unspecified site: Secondary | ICD-10-CM | POA: Diagnosis not present

## 2019-02-25 DIAGNOSIS — E11621 Type 2 diabetes mellitus with foot ulcer: Secondary | ICD-10-CM | POA: Diagnosis not present

## 2019-02-26 ENCOUNTER — Other Ambulatory Visit: Payer: Self-pay | Admitting: Neurology

## 2019-03-18 ENCOUNTER — Other Ambulatory Visit: Payer: Self-pay | Admitting: Neurology

## 2019-03-19 ENCOUNTER — Other Ambulatory Visit: Payer: Self-pay

## 2019-03-19 ENCOUNTER — Telehealth: Payer: Self-pay

## 2019-03-19 MED ORDER — CARBIDOPA-LEVODOPA ER 50-200 MG PO TBCR: EXTENDED_RELEASE_TABLET | ORAL | 3 refills | Status: DC

## 2019-03-19 NOTE — Telephone Encounter (Signed)
Medication sent to [facility]. 

## 2019-03-19 NOTE — Telephone Encounter (Signed)
1) Medication(s) Requested (by name): carbidopa-levodopa (SINEMET CR) 50-200 MG tablet   2) Pharmacy of Choice: [facility] - [place], [place] - [address], [place] [address]

## 2019-03-21 DIAGNOSIS — L603 Nail dystrophy: Secondary | ICD-10-CM | POA: Diagnosis not present

## 2019-03-21 DIAGNOSIS — B353 Tinea pedis: Secondary | ICD-10-CM | POA: Diagnosis not present

## 2019-03-21 DIAGNOSIS — E1142 Type 2 diabetes mellitus with diabetic polyneuropathy: Secondary | ICD-10-CM | POA: Diagnosis not present

## 2019-03-26 DIAGNOSIS — K828 Other specified diseases of gallbladder: Secondary | ICD-10-CM | POA: Diagnosis not present

## 2019-03-26 DIAGNOSIS — M1990 Unspecified osteoarthritis, unspecified site: Secondary | ICD-10-CM | POA: Diagnosis not present

## 2019-03-26 DIAGNOSIS — E119 Type 2 diabetes mellitus without complications: Secondary | ICD-10-CM | POA: Diagnosis not present

## 2019-03-26 DIAGNOSIS — E11621 Type 2 diabetes mellitus with foot ulcer: Secondary | ICD-10-CM | POA: Diagnosis not present

## 2019-03-26 DIAGNOSIS — I10 Essential (primary) hypertension: Secondary | ICD-10-CM | POA: Diagnosis not present

## 2019-03-26 DIAGNOSIS — G20 Parkinson's disease: Secondary | ICD-10-CM | POA: Diagnosis not present

## 2019-04-21 ENCOUNTER — Telehealth: Payer: Self-pay | Admitting: Neurology

## 2019-04-21 NOTE — Telephone Encounter (Signed)
The patient's been on Seroquel since September 2020.  When he went down on the Mirapex slightly around that time, may need to keep reducing the Mirapex and may need to go up on the Seroquel.  I will address these issues tomorrow.

## 2019-04-21 NOTE — Telephone Encounter (Signed)
Wolanski,Linda(wife) has called to report that pt had a very rough weekend.  Pt had hallucinations throughout the weekend, did not sleep much, some nights he walked throughout the house fussing and cursing(wife states this is not his normal behavior).  Wife asking for a call to discuss pt's medications

## 2019-04-21 NOTE — Telephone Encounter (Signed)
I called Elijah [name], spoke with Elijah [name]. Elijah [name] has been cursing and not sleeping well this weekend. Elijah [name] reports that the "sleeping pill" "wired him" this weekend. This is not Elijah typical behavior. Elijah [name] has been in a "rage" concerned about people in the house but Elijah [name] reports that these are hallucinations. Elijah [name] reports that he is getting difficult to handle. Elijah [name] is tearful during this conversation.   Elijah [name] is overdue for an appointment. An appt was scheduled for 04/22/2019 at 11:30am with Dr. Jannifer Franklin. I informed Elijah [name] that I will also send this update to Dr. Jannifer Franklin. Elijah [name] verbalized understanding.

## 2019-04-22 ENCOUNTER — Encounter: Payer: Self-pay | Admitting: Neurology

## 2019-04-22 ENCOUNTER — Other Ambulatory Visit: Payer: Self-pay

## 2019-04-22 ENCOUNTER — Ambulatory Visit: Payer: PPO | Admitting: Neurology

## 2019-04-22 VITALS — BP 136/74 | HR 69 | Ht 70.5 in | Wt 198.0 lb

## 2019-04-22 DIAGNOSIS — G20A1 Parkinson's disease without dyskinesia, without mention of fluctuations: Secondary | ICD-10-CM

## 2019-04-22 DIAGNOSIS — G20 Parkinson's disease: Secondary | ICD-10-CM | POA: Diagnosis not present

## 2019-04-22 MED ORDER — PRAMIPEXOLE DIHYDROCHLORIDE 0.75 MG PO TABS: 0.7500 mg | ORAL_TABLET | Freq: Three times a day (TID) | ORAL | 1 refills | Status: DC

## 2019-04-22 MED ORDER — QUETIAPINE FUMARATE 25 MG PO TABS: 75.0000 mg | ORAL_TABLET | Freq: Every day | ORAL | 3 refills | Status: DC

## 2019-04-22 MED ORDER — QUETIAPINE FUMARATE 25 MG PO TABS: 37.5000 mg | ORAL_TABLET | Freq: Every day | ORAL | 3 refills | Status: DC

## 2019-04-22 NOTE — Progress Notes (Signed)
Reason for visit: Parkinson's disease  Elijah [name] is an 79 y.o. male  History of present illness:  Elijah [name] is a 79 year old left-handed white male with a history of Parkinson's disease.  The patient walks with a walker, he generally does well with this.  He unfortunately had a lot of problems with hallucinations and delusional thinking over the last 8 months or so.  The patient was reduced on the Mirapex dosing and Seroquel was added at night, he cannot tolerate 50 mg at night, he went back to 25 mg at night.  He has done relatively well with the hallucinations until last weekend when he began having problems seeing people about the house, going into his wife's bedroom having sex with her.  The wife is very upset about the hallucinations.  The patient has not had any falls, he may have occasional stumbles.  He has had some intentional weight loss, he has been able to reduce his Metformin dose to 1 tablet daily but otherwise there have been no other changes in his medications.  Past Medical History:  Diagnosis Date  . Chronic low back pain 03/24/2015  . Degenerative arthritis   . Diabetes mellitus without complication ([place])   . Dyslipidemia   . Glaucoma   . Gout 10/07/2012  . Hypertension   . Low back pain   . Obesity   . OSA (obstructive sleep apnea) 04/10/2014   cannot tolerate, PCP aware.  . Pancreatitis   . Parkinson disease ([place])   . Rotator cuff tear    Bilateral, no surgery    Past Surgical History:  Procedure Laterality Date  . AMPUTATION Right 01/31/2018   Procedure: AMPUTATION RIGHT THIRD TOE;  Surgeon: Caprice Beaver, DPM;  Location: [facility];  Service: Podiatry;  Laterality: Right;  . APPENDECTOMY    . BALLOON DILATION N/A 09/06/2015   Procedure: BALLOON DILATION;  Surgeon: Daneil Dolin, MD;  Location[facility];  Service: Endoscopy;  Laterality: N/A;  . BILIARY STENT PLACEMENT N/A 09/08/2015   Procedure: BILIARY STENT PLACEMENT;  Surgeon: Daneil Dolin,  MD;  [facility];  Service: Endoscopy;  Laterality: N/A;  . ERCP N/A 09/06/2015   Procedure: ENDOSCOPIC RETROGRADE CHOLANGIOPANCREATOGRAPHY (ERCP);  Surgeon: Daneil Dolin, MD;  [facility];  Service: Endoscopy;  Laterality: N/A;  . ERCP N/A 09/08/2015   Procedure: ENDOSCOPIC RETROGRADE CHOLANGIOPANCREATOGRAPHY (ERCP);  Surgeon: Daneil Dolin, MD;  [facility];  Service: Endoscopy;  Laterality: N/A;  with bile duct brushings  . HERNIA REPAIR     umbilical  . SHOULDER BONE SPUR Left    RESECTION  . SPHINCTEROTOMY N/A 09/06/2015   Procedure: SPHINCTEROTOMY;  Surgeon: Daneil Dolin, MD;  [facility];  Service: Endoscopy;  Laterality: N/A;  . TONSILLECTOMY      Family History  Problem Relation Age of Onset  . Stroke Mother   . Diabetes Mother   . Heart Problems Mother   . Parkinsonism Father   . Heart Problems Father     Social history:  reports that he has never smoked. He has never used smokeless tobacco. He reports that he does not drink alcohol or use drugs.    Allergies  Allergen Reactions  . Aspirin Other (See Comments)    Stomach ulcers.  . Coconut Oil     Other reaction(s): GI Upset (intolerance)  . Penicillins Rash    Has patient had a PCN reaction causing immediate rash, facial/tongue/throat  swelling, SOB or lightheadedness with hypotension: no Has patient had a PCN reaction causing severe rash involving mucus membranes or skin necrosis: No Has patient had a PCN reaction that required hospitalization No Has patient had a PCN reaction occurring within the last 10 years: No If all of the above answers are "NO", then may proceed with Cephalosporin use.     Medications:  Prior to Admission medications   Medication Sig Start Date End Date Taking? Authorizing Provider  ACCU-CHEK AVIVA PLUS test strip  12/05/13  Yes [provider]  aspirin 81 MG tablet Take 81 mg by mouth daily.   Yes [provider]    Azelastine HCl 0.15 % SOLN Place 1 spray into both nostrils daily as needed (congestion).  08/18/15  Yes [provider]  carbidopa-levodopa (SINEMET CR) 50-200 MG tablet TAKE (1) TABLET BY MOUTH (4) TIMES DAILY. 03/19/19  Yes Kathrynn Ducking, MD  Carboxymethylcellul-Glycerin (REFRESH OPTIVE) 1-0.9 % GEL Place 1 drop into both eyes daily as needed (dry eyes).   Yes [provider]  cyclobenzaprine (FLEXERIL) 10 MG tablet Take 1 tablet (10 mg total) by mouth 2 (two) times daily as needed for muscle spasms. Patient taking differently: Take 10 mg by mouth 3 (three) times daily as needed for muscle spasms.  07/02/12  Yes Teressa Lower, MD  doxycycline (VIBRA-TABS) 100 MG tablet Take 100 mg by mouth 2 (two) times daily. 01/16/18  Yes [provider]  furosemide (LASIX) 20 MG tablet Take 20 mg by mouth daily as needed for fluid or edema.  10/02/13  Yes [provider]  meloxicam (MOBIC) 15 MG tablet Take 15 mg by mouth daily as needed for pain.  09/30/12  Yes [provider]  metFORMIN (GLUCOPHAGE) 500 MG tablet Take 500 mg by mouth daily with breakfast.  10/05/12  Yes [provider]  metoprolol tartrate (LOPRESSOR) 25 MG tablet Take 12.5 mg by mouth daily.    Yes [provider]  pantoprazole (PROTONIX) 40 MG tablet Take 40 mg by mouth daily. 03/08/14  Yes [provider]  potassium chloride SA (K-DUR,KLOR-CON) 20 MEQ tablet Take 20 mEq by mouth daily.  10/24/13  Yes [provider]  pramipexole (MIRAPEX) 1 MG tablet TAKE 1 TABLET(1 MG) BY MOUTH THREE TIMES DAILY 02/04/19  Yes Kathrynn Ducking, MD  QUEtiapine (SEROQUEL) 25 MG tablet Take 2 tablets (50 mg total) by mouth at bedtime. 02/03/19  Yes Kathrynn Ducking, MD  SALINE MIST SPRAY NA Place 1 spray into both nostrils daily as needed (congestion).    Yes [provider]  selegiline (ELDEPRYL) 5 MG tablet TAKE 1 TABLET (5 MG TOTAL) BY MOUTH 2 TIMES DAILY WITH A MEAL.  02/03/19  Yes Kathrynn Ducking, MD  simvastatin (ZOCOR) 40 MG tablet Take 20 mg by mouth at bedtime.  09/05/12  Yes [provider]  valsartan-hydrochlorothiazide (DIOVAN-HCT) 80-12.5 MG per tablet Take 1 tablet by mouth daily.   Yes [provider]  LORazepam (ATIVAN) 1 MG tablet Take 1 mg by mouth daily. 12/31/17   [provider]    ROS:  Out of a complete 14 system review of symptoms, the patient complains only of the following symptoms, and all other reviewed systems are negative.  Hallucinations Walking difficulty  Blood pressure 136/74, pulse 69, height 5' 10.5" (1.791 m), weight 198 lb (89.8 kg).  Physical Exam  General: The patient is alert and cooperative at the time of the examination.  Skin: No significant  peripheral edema is noted.   Neurologic Exam  Mental status: The patient is alert and oriented x 3 at the time of the examination. The patient has apparent normal recent and remote memory, with an apparently normal attention span and concentration ability.   Cranial nerves: Facial symmetry is present. Speech is normal, no aphasia or dysarthria is noted. Extraocular movements are full. Visual fields are full.  Mild masking of the face is seen.  Motor: The patient has good strength in all 4 extremities.  Sensory examination: Soft touch sensation is symmetric on the face, arms, and legs.  Coordination: The patient has good finger-nose-finger and heel-to-shin bilaterally.  Occasional resting tremor seen with the left upper extremity.  Gait and station: The patient is able to walk with a walker, he has good stride with this, he has a tendency to lean to the right with significant scoliosis of the spine.  Tandem gait was not attempted.  Reflexes: Deep tendon reflexes are symmetric.   CT head 01/01/19:  IMPRESSION: Normal head CT with and without contrast.  * CT scan images were reviewed online. I agree with the written  report.    Assessment/Plan:  1.  Parkinson's disease  2.  Gait disorder  3.  Hallucinations, delusional thinking  The patient is having some problems with hallucinations and delusional thinking, we will again reduce the dose of the Mirapex taking 0.75 mg 3 times daily, the Seroquel dose will be increased slightly to 1.5 of the 25 mg tablets at night.  If the symptoms do not improve, we may consider a switch over to Nuplazid.  The patient will follow up in 4 months.  Jill Alexanders MD 04/22/2019 11:55 AM  [facility] [address] [place], [place] [address]  Phone [phone] Fax [phone]

## 2019-05-06 ENCOUNTER — Telehealth: Payer: Self-pay | Admitting: Neurology

## 2019-05-06 MED ORDER — NUPLAZID 34 MG PO CAPS: 34.0000 mg | ORAL_CAPSULE | Freq: Every day | ORAL | 3 refills | Status: DC

## 2019-05-06 NOTE — Telephone Encounter (Signed)
I called and left a message, I will call back later. 

## 2019-05-06 NOTE — Telephone Encounter (Signed)
I called the wife.  The patient still having hallucinations that are fairly active along with delusional thinking.  He responded transiently to the Seroquel, now back to his usual hallucination frequency.  The patient is getting agitated with hallucinations, we will go ahead and try to switch over to Nuplazid, if we can get the Nuplazid they will stop the Seroquel.

## 2019-05-06 NOTE — Telephone Encounter (Signed)
Pt wife called to report pt is worsening and states pt is cursing and imagining people are with him and there's no one in the home. States pt yells and curses all day and states she has been sleeping with multiple  People but no one is with her and would like to know what can be done for him as it has become frustrating

## 2019-05-08 ENCOUNTER — Telehealth: Payer: Self-pay

## 2019-05-08 NOTE — Telephone Encounter (Signed)
PA done for Nuplazid on cover my meds.[employer] has received your information, and the request will be reviewed. You may close this dialog, return to your dashboard, and perform other tasks. You will receive an electronic determination in CoverMyMeds. You can see the latest determination by locating this request on your dashboard or by reopening this request. You will also receive a faxed copy of the determination. If you have any questions please contact [employer] at [phone]. If you need assistance, please chat with CoverMyMeds or call us at [phone].

## 2019-05-08 NOTE — Telephone Encounter (Signed)
Kanno,Linda(wife) she is calling re: the Nuplazid,  She was calling to inform RN not order it if the cost would be $1,100.00.  Phone rep did not inform wife of the approval from 05/07/2019 to 05/07/2020 because wife has additional questions re: the medication.  Please call

## 2019-05-08 NOTE — Telephone Encounter (Signed)
Nuplazid 34mg  capsule approve from 05/07/2019 to 05/07/2020 per fax. Contact number is [phone].

## 2019-05-08 NOTE — Telephone Encounter (Signed)
PTs wife call to state pharmacy stated medication would cost over 1000 dollars with approval. I stated message will be sent to Dr. Jannifer Franklin.The wife verbalized understanding.

## 2019-05-09 MED ORDER — QUETIAPINE FUMARATE 25 MG PO TABS: 50.0000 mg | ORAL_TABLET | Freq: Every day | ORAL | 3 refills | Status: DC

## 2019-05-09 NOTE — Telephone Encounter (Signed)
I called and talk with the wife.  The Nuplazid is extremely expensive, I will try to look into seeing whether there is a foundation that may help cover the cost of the medication.  In the meantime we will go up on the Seroquel to 50 mg in the evening, if need be, we can add a half a tablet of the 25 mg tablets at 5 PM.  The patient will let me know in a week or 2 if the dose increase has not been effective.

## 2019-05-09 NOTE — Addendum Note (Signed)
Addended by: Kathrynn Ducking on: 05/09/2019 08:02 AM   Modules accepted: Orders

## 2019-05-12 ENCOUNTER — Telehealth: Payer: Self-pay | Admitting: Neurology

## 2019-05-12 NOTE — Telephone Encounter (Signed)
Pt's wife Vaughan Basta on [place] called stating that the pt is seeing people that are not there and is giving her a hard time when she is trying to get him ready to got out. Pt curses and holler's at her and get's into a rage and she is not able to deal with much more. She would like provider to advise her.

## 2019-05-12 NOTE — Telephone Encounter (Signed)
We can also send in a prescription for hydroxyzine 25 mg to take 1 hour before bedtime.

## 2019-05-12 NOTE — Telephone Encounter (Signed)
I called pts wife Vaughan Basta about her husband behavior. She stated pt is seeing people in her bed and saying they are there. He is having a lot of hallucinations at night time. He has been up the majority of the night since Friday. She is giving him 50mg  at QHS and 25 at 5pm per Dr.Willis advice from 3/192021.  I advise her Dr.WIllis wants [place] to call the foundation for pt assistance for Nuplazid. I stated I will contact them today. The wife began to cry and stated "something has to be done". She stated Friday night he came into the bedroom with knives and stated he was going to get the people in the bed with her. The wife explain to pt there is no one in the bed with him and took the knives from him and hid them. Their son lives next door and helps her out when she needs assistance.One time the pt was on the porch at night time with his walker. So now she has hidden the house keys and knives. I advise the wife to keep pt and herself safe at all times. I advise her to hid sharp objects from the pt. I also advise the wife that if she starts to have aggressive behavior again and curses and she feels unsafe to call 911 for assistance. I advise the wife the pt safety and hers is whats important at this time. I advise her the work in md can review.The wife verbalized understanding.

## 2019-05-13 NOTE — Telephone Encounter (Signed)
I tried to call pts home number to speak with wife. The home number was busy to give Dr.Sater work in md recommendations.

## 2019-05-13 NOTE — Telephone Encounter (Signed)
LEft vm for Nuplazid patient assistance to find out the process for pt assistance.

## 2019-05-14 ENCOUNTER — Other Ambulatory Visit: Payer: Self-pay

## 2019-05-14 DIAGNOSIS — E1142 Type 2 diabetes mellitus with diabetic polyneuropathy: Secondary | ICD-10-CM | POA: Diagnosis not present

## 2019-05-14 MED ORDER — HYDROXYZINE HCL 25 MG PO TABS: 25.0000 mg | ORAL_TABLET | Freq: Every day | ORAL | 1 refills | Status: DC

## 2019-05-14 NOTE — Progress Notes (Signed)
hydroxyzine sent per Dr. Felecia Shelling note.

## 2019-05-14 NOTE — Telephone Encounter (Signed)
I called pts wife about Dr.Sater work in recommend to send in  hydroxyzine 25 mg to take 1 hour before bedtime. The wife advise me to send to [facility]. She verbalized understanding and will give her husband the medication before bedtime. I advise her it was sent to pharmacy.

## 2019-05-14 NOTE — Telephone Encounter (Signed)
Pt wife called back in regards to missed call    Cb# [phone]

## 2019-05-14 NOTE — Telephone Encounter (Signed)
I called Nuplazid connect who helps pt with pt assistance at  [phone]. I stated pt has [employer] insurance. She fax me a form for pt assistance. She stated pt and md have to sign it. I stated it was approve but over a 1000 copay. She recommend filling out form and faxing back.

## 2019-05-14 NOTE — Telephone Encounter (Signed)
I spoke with patient wife that a message with left with nuplazid staff to call back concerning process for patient assistance.

## 2019-05-15 DIAGNOSIS — E785 Hyperlipidemia, unspecified: Secondary | ICD-10-CM | POA: Diagnosis not present

## 2019-05-15 DIAGNOSIS — R443 Hallucinations, unspecified: Secondary | ICD-10-CM | POA: Diagnosis not present

## 2019-05-15 DIAGNOSIS — M1991 Primary osteoarthritis, unspecified site: Secondary | ICD-10-CM | POA: Diagnosis not present

## 2019-05-15 DIAGNOSIS — K839 Disease of biliary tract, unspecified: Secondary | ICD-10-CM | POA: Diagnosis not present

## 2019-05-15 DIAGNOSIS — E782 Mixed hyperlipidemia: Secondary | ICD-10-CM | POA: Diagnosis not present

## 2019-05-15 DIAGNOSIS — G4701 Insomnia due to medical condition: Secondary | ICD-10-CM | POA: Diagnosis not present

## 2019-05-15 DIAGNOSIS — E1169 Type 2 diabetes mellitus with other specified complication: Secondary | ICD-10-CM | POA: Diagnosis not present

## 2019-05-15 DIAGNOSIS — E11621 Type 2 diabetes mellitus with foot ulcer: Secondary | ICD-10-CM | POA: Diagnosis not present

## 2019-05-15 DIAGNOSIS — I10 Essential (primary) hypertension: Secondary | ICD-10-CM | POA: Diagnosis not present

## 2019-05-15 DIAGNOSIS — Z89421 Acquired absence of other right toe(s): Secondary | ICD-10-CM | POA: Diagnosis not present

## 2019-05-15 DIAGNOSIS — G894 Chronic pain syndrome: Secondary | ICD-10-CM | POA: Diagnosis not present

## 2019-05-15 DIAGNOSIS — M545 Low back pain: Secondary | ICD-10-CM | POA: Diagnosis not present

## 2019-05-15 DIAGNOSIS — E119 Type 2 diabetes mellitus without complications: Secondary | ICD-10-CM | POA: Diagnosis not present

## 2019-05-15 DIAGNOSIS — G20 Parkinson's disease: Secondary | ICD-10-CM | POA: Diagnosis not present

## 2019-05-15 DIAGNOSIS — J019 Acute sinusitis, unspecified: Secondary | ICD-10-CM | POA: Diagnosis not present

## 2019-05-15 DIAGNOSIS — K828 Other specified diseases of gallbladder: Secondary | ICD-10-CM | POA: Diagnosis not present

## 2019-05-15 DIAGNOSIS — F0391 Unspecified dementia with behavioral disturbance: Secondary | ICD-10-CM | POA: Diagnosis not present

## 2019-05-19 NOTE — Telephone Encounter (Signed)
I called [facility] at  [phone]. I stated pt was unable to come in office and sign form because of the distance. They stated treatment form for Nuplazid can be fax in once its filled out and they can mailed pt a hippa form.

## 2019-05-19 NOTE — Telephone Encounter (Signed)
Nuplazid treatment form, insurance card and demographic sheet  with [employer] [facility] all fax to [phone]. All was fax twice and confirmed for pts assistance. The wife is aware that the foundation pt assistance will be contacting her by mail and phone. Valinda Hoar will process the pts information to see if they are eligible for pt assistance.

## 2019-05-19 NOTE — Telephone Encounter (Signed)
Pt wife called stating pt symptoms are the same with new medcation and she would like to know what other options are available  Patient wife would also like to check on the pt's  Pimavanserin Tartrate (NUPLAZID) 34 MG CAPS financial assistance she states she was expecting a process to be done through an organization to help with the cost  Of medication

## 2019-05-19 NOTE — Telephone Encounter (Signed)
I called pts wife that form was fax Thursday to our office and has to be completed and sign by pt and MD. The wife stated they live in [place] and would be unable to come in and sign form. I stated a call will be made to see what other options Elijah [name] would have if pt cant come in office to sign. She verbalized understanding.

## 2019-05-20 DIAGNOSIS — E11621 Type 2 diabetes mellitus with foot ulcer: Secondary | ICD-10-CM | POA: Diagnosis not present

## 2019-05-20 DIAGNOSIS — G20 Parkinson's disease: Secondary | ICD-10-CM | POA: Diagnosis not present

## 2019-05-20 DIAGNOSIS — M1990 Unspecified osteoarthritis, unspecified site: Secondary | ICD-10-CM | POA: Diagnosis not present

## 2019-05-20 DIAGNOSIS — K828 Other specified diseases of gallbladder: Secondary | ICD-10-CM | POA: Diagnosis not present

## 2019-05-20 DIAGNOSIS — I10 Essential (primary) hypertension: Secondary | ICD-10-CM | POA: Diagnosis not present

## 2019-05-20 DIAGNOSIS — E119 Type 2 diabetes mellitus without complications: Secondary | ICD-10-CM | POA: Diagnosis not present

## 2019-05-20 NOTE — Telephone Encounter (Signed)
Elijah [name] called stating that he has faxed the form back with instructions stating that the box for the samples has to be checked off and then forms need to be faxed back to the fax number on the top of the page. [phone]

## 2019-05-20 NOTE — Telephone Encounter (Signed)
I spoke with Adrian Prince from [place] that question 14 has to be check for 14 day supply,and check the 34mg  dosage daily. He stated to fax the completed form back and pt or wife will be contacted.  I refax the nuplazid treatment form revise form back to [phone] twice and it was confirmed.

## 2019-05-23 ENCOUNTER — Telehealth: Payer: Self-pay | Admitting: Neurology

## 2019-05-23 DIAGNOSIS — K828 Other specified diseases of gallbladder: Secondary | ICD-10-CM | POA: Diagnosis not present

## 2019-05-23 DIAGNOSIS — E119 Type 2 diabetes mellitus without complications: Secondary | ICD-10-CM | POA: Diagnosis not present

## 2019-05-23 DIAGNOSIS — I10 Essential (primary) hypertension: Secondary | ICD-10-CM | POA: Diagnosis not present

## 2019-05-23 DIAGNOSIS — E11621 Type 2 diabetes mellitus with foot ulcer: Secondary | ICD-10-CM | POA: Diagnosis not present

## 2019-05-23 DIAGNOSIS — M1990 Unspecified osteoarthritis, unspecified site: Secondary | ICD-10-CM | POA: Diagnosis not present

## 2019-05-23 DIAGNOSIS — G20 Parkinson's disease: Secondary | ICD-10-CM | POA: Diagnosis not present

## 2019-05-23 NOTE — Telephone Encounter (Signed)
I called and left message.  The patient will cut back to 25 mg at night on the Seroquel when the Nuplazid is started.  He will do that for 2 weeks and then stop the Seroquel and stay with the Nuplazid.  They will call for any dose adjustments of the medication.

## 2019-05-23 NOTE — Telephone Encounter (Signed)
Pt called stating that someone will be coming to the home soon to give him the Pimavanserin Tartrate (NUPLAZID) 34 MG CAPS in a little bit and he is wanting to know if he should stop taking his old medicine or does he continue to take it along with the Nuplazid. Please advise.

## 2019-05-28 DIAGNOSIS — G894 Chronic pain syndrome: Secondary | ICD-10-CM | POA: Diagnosis not present

## 2019-05-28 DIAGNOSIS — K839 Disease of biliary tract, unspecified: Secondary | ICD-10-CM | POA: Diagnosis not present

## 2019-05-28 DIAGNOSIS — E782 Mixed hyperlipidemia: Secondary | ICD-10-CM | POA: Diagnosis not present

## 2019-05-28 DIAGNOSIS — Z89421 Acquired absence of other right toe(s): Secondary | ICD-10-CM | POA: Diagnosis not present

## 2019-05-28 DIAGNOSIS — G20 Parkinson's disease: Secondary | ICD-10-CM | POA: Diagnosis not present

## 2019-05-28 DIAGNOSIS — R443 Hallucinations, unspecified: Secondary | ICD-10-CM | POA: Diagnosis not present

## 2019-05-28 DIAGNOSIS — E1169 Type 2 diabetes mellitus with other specified complication: Secondary | ICD-10-CM | POA: Diagnosis not present

## 2019-05-28 DIAGNOSIS — F0391 Unspecified dementia with behavioral disturbance: Secondary | ICD-10-CM | POA: Diagnosis not present

## 2019-05-28 DIAGNOSIS — I10 Essential (primary) hypertension: Secondary | ICD-10-CM | POA: Diagnosis not present

## 2019-05-30 ENCOUNTER — Encounter (HOSPITAL_COMMUNITY): Payer: Self-pay | Admitting: *Deleted

## 2019-05-30 ENCOUNTER — Emergency Department (HOSPITAL_COMMUNITY): Payer: PPO

## 2019-05-30 ENCOUNTER — Other Ambulatory Visit: Payer: Self-pay

## 2019-05-30 ENCOUNTER — Emergency Department (HOSPITAL_COMMUNITY)
Admission: EM | Admit: 2019-05-30 | Discharge: 2019-06-01 | Disposition: A | Discharge status: Other Acute Inpatient Hospital | Payer: PPO | Attending: Emergency Medicine | Admitting: Emergency Medicine

## 2019-05-30 DIAGNOSIS — R440 Auditory hallucinations: Secondary | ICD-10-CM | POA: Insufficient documentation

## 2019-05-30 DIAGNOSIS — Z7982 Long term (current) use of aspirin: Secondary | ICD-10-CM | POA: Diagnosis not present

## 2019-05-30 DIAGNOSIS — F0281 Dementia in other diseases classified elsewhere with behavioral disturbance: Secondary | ICD-10-CM | POA: Diagnosis not present

## 2019-05-30 DIAGNOSIS — E119 Type 2 diabetes mellitus without complications: Secondary | ICD-10-CM | POA: Insufficient documentation

## 2019-05-30 DIAGNOSIS — Z03818 Encounter for observation for suspected exposure to other biological agents ruled out: Secondary | ICD-10-CM | POA: Diagnosis not present

## 2019-05-30 DIAGNOSIS — I10 Essential (primary) hypertension: Secondary | ICD-10-CM | POA: Diagnosis not present

## 2019-05-30 DIAGNOSIS — G20 Parkinson's disease: Secondary | ICD-10-CM

## 2019-05-30 DIAGNOSIS — F0390 Unspecified dementia without behavioral disturbance: Secondary | ICD-10-CM | POA: Diagnosis not present

## 2019-05-30 DIAGNOSIS — R45851 Suicidal ideations: Secondary | ICD-10-CM | POA: Insufficient documentation

## 2019-05-30 DIAGNOSIS — Z79899 Other long term (current) drug therapy: Secondary | ICD-10-CM | POA: Insufficient documentation

## 2019-05-30 DIAGNOSIS — R4182 Altered mental status, unspecified: Secondary | ICD-10-CM | POA: Diagnosis not present

## 2019-05-30 DIAGNOSIS — Z046 Encounter for general psychiatric examination, requested by authority: Secondary | ICD-10-CM | POA: Diagnosis present

## 2019-05-30 DIAGNOSIS — Z20822 Contact with and (suspected) exposure to covid-19: Secondary | ICD-10-CM | POA: Diagnosis not present

## 2019-05-30 DIAGNOSIS — G3183 Dementia with Lewy bodies: Secondary | ICD-10-CM | POA: Diagnosis not present

## 2019-05-30 DIAGNOSIS — F02818 Dementia in other diseases classified elsewhere, unspecified severity, with other behavioral disturbance: Secondary | ICD-10-CM

## 2019-05-30 LAB — CBC WITH DIFFERENTIAL/PLATELET
Abs Immature Granulocytes: 0.03 10*3/uL (ref 0.00–0.07)
Basophils Absolute: 0.1 10*3/uL (ref 0.0–0.1)
Basophils Relative: 1 %
Eosinophils Absolute: 0.2 10*3/uL (ref 0.0–0.5)
Eosinophils Relative: 3 %
HCT: 34.9 % — ABNORMAL LOW (ref 39.0–52.0)
Hemoglobin: 11.7 g/dL — ABNORMAL LOW (ref 13.0–17.0)
Immature Granulocytes: 0 %
Lymphocytes Relative: 25 %
Lymphs Abs: 1.8 10*3/uL (ref 0.7–4.0)
MCH: 31.7 pg (ref 26.0–34.0)
MCHC: 33.5 g/dL (ref 30.0–36.0)
MCV: 94.6 fL (ref 80.0–100.0)
Monocytes Absolute: 0.7 10*3/uL (ref 0.1–1.0)
Monocytes Relative: 9 %
Neutro Abs: 4.3 10*3/uL (ref 1.7–7.7)
Neutrophils Relative %: 62 %
Platelets: 279 10*3/uL (ref 150–400)
RBC: 3.69 MIL/uL — ABNORMAL LOW (ref 4.22–5.81)
RDW: 13 % (ref 11.5–15.5)
WBC: 7 10*3/uL (ref 4.0–10.5)
nRBC: 0 % (ref 0.0–0.2)

## 2019-05-30 LAB — COMPREHENSIVE METABOLIC PANEL
ALT: 7 U/L (ref 0–44)
AST: 19 U/L (ref 15–41)
Albumin: 4 g/dL (ref 3.5–5.0)
Alkaline Phosphatase: 63 U/L (ref 38–126)
Anion gap: 7 (ref 5–15)
BUN: 26 mg/dL — ABNORMAL HIGH (ref 8–23)
CO2: 27 mmol/L (ref 22–32)
Calcium: 8.8 mg/dL — ABNORMAL LOW (ref 8.9–10.3)
Chloride: 103 mmol/L (ref 98–111)
Creatinine, Ser: 0.86 mg/dL (ref 0.61–1.24)
GFR calc Af Amer: >60 mL/min (ref >60)
GFR calc non Af Amer: >60 mL/min (ref >60)
Glucose, Bld: 118 mg/dL — ABNORMAL HIGH (ref 70–99)
Potassium: 3.9 mmol/L (ref 3.5–5.1)
Sodium: 137 mmol/L (ref 135–145)
Total Bilirubin: 0.9 mg/dL (ref 0.3–1.2)
Total Protein: 6.6 g/dL (ref 6.5–8.1)

## 2019-05-30 LAB — RAPID URINE DRUG SCREEN, HOSP PERFORMED
Amphetamines: NOT DETECTED
Barbiturates: NOT DETECTED
Benzodiazepines: NOT DETECTED
Cocaine: NOT DETECTED
Opiates: NOT DETECTED
Tetrahydrocannabinol: NOT DETECTED

## 2019-05-30 LAB — URINALYSIS, ROUTINE W REFLEX MICROSCOPIC
Bilirubin Urine: NEGATIVE
Glucose, UA: NEGATIVE mg/dL
Hgb urine dipstick: NEGATIVE
Ketones, ur: NEGATIVE mg/dL
Leukocytes,Ua: NEGATIVE
Nitrite: NEGATIVE
Protein, ur: NEGATIVE mg/dL
Specific Gravity, Urine: 1.019 (ref 1.005–1.030)
pH: 5 (ref 5.0–8.0)

## 2019-05-30 LAB — RESPIRATORY PANEL BY RT PCR (FLU A&B, COVID)
Influenza A by PCR: NEGATIVE
Influenza B by PCR: NEGATIVE
SARS Coronavirus 2 by RT PCR: NEGATIVE

## 2019-05-30 LAB — ETHANOL: Alcohol, Ethyl (B): <10 mg/dL (ref <10)

## 2019-05-30 MED ORDER — HYDROCHLOROTHIAZIDE 25 MG PO TABS
12.5000 mg | ORAL_TABLET | Freq: Every day | ORAL | Status: DC
  Administered 2019-05-31 – 2019-06-01 (×2): 12.5 mg via ORAL
  Filled 2019-05-30 (×2): qty 1

## 2019-05-30 MED ORDER — AZELASTINE HCL 0.1 % NA SOLN
1.0000 | Freq: Every day | NASAL | Status: DC | PRN
  Filled 2019-05-30: qty 30

## 2019-05-30 MED ORDER — QUETIAPINE FUMARATE 25 MG PO TABS
50.0000 mg | ORAL_TABLET | Freq: Every day | ORAL | Status: DC
  Administered 2019-05-30 – 2019-05-31 (×2): 50 mg via ORAL
  Filled 2019-05-30 (×2): qty 2

## 2019-05-30 MED ORDER — CARBOXYMETHYLCELLUL-GLYCERIN 1-0.9 % OP GEL: 1.0000 [drp] | Freq: Every day | OPHTHALMIC | Status: DC | PRN

## 2019-05-30 MED ORDER — METOPROLOL TARTRATE 25 MG PO TABS
12.5000 mg | ORAL_TABLET | Freq: Every day | ORAL | Status: DC
  Administered 2019-06-01: 12.5 mg via ORAL
  Filled 2019-05-30 (×2): qty 1

## 2019-05-30 MED ORDER — ASPIRIN 81 MG PO CHEW
81.0000 mg | CHEWABLE_TABLET | Freq: Every day | ORAL | Status: DC
  Administered 2019-05-30 – 2019-06-01 (×3): 81 mg via ORAL
  Filled 2019-05-30 (×3): qty 1

## 2019-05-30 MED ORDER — SIMVASTATIN 10 MG PO TABS
20.0000 mg | ORAL_TABLET | Freq: Every day | ORAL | Status: DC
  Administered 2019-05-30 – 2019-05-31 (×2): 20 mg via ORAL
  Filled 2019-05-30 (×2): qty 2

## 2019-05-30 MED ORDER — METFORMIN HCL 500 MG PO TABS
500.0000 mg | ORAL_TABLET | Freq: Every day | ORAL | Status: DC
  Administered 2019-05-31 – 2019-06-01 (×2): 500 mg via ORAL
  Filled 2019-05-30 (×2): qty 1

## 2019-05-30 MED ORDER — POTASSIUM CHLORIDE CRYS ER 20 MEQ PO TBCR
20.0000 meq | EXTENDED_RELEASE_TABLET | Freq: Every day | ORAL | Status: DC
  Administered 2019-05-31 – 2019-06-01 (×2): 20 meq via ORAL
  Filled 2019-05-30 (×2): qty 1

## 2019-05-30 MED ORDER — HYDROCHLOROTHIAZIDE 12.5 MG PO CAPS
12.5000 mg | ORAL_CAPSULE | Freq: Every day | ORAL | Status: DC
  Filled 2019-05-30 (×4): qty 1

## 2019-05-30 MED ORDER — IRBESARTAN 75 MG PO TABS
75.0000 mg | ORAL_TABLET | Freq: Every day | ORAL | Status: DC
  Administered 2019-05-31 – 2019-06-01 (×2): 75 mg via ORAL
  Filled 2019-05-30 (×4): qty 1

## 2019-05-30 MED ORDER — PIMAVANSERIN TARTRATE 34 MG PO CAPS
34.0000 mg | ORAL_CAPSULE | Freq: Every day | ORAL | Status: DC
  Filled 2019-05-30 (×2): qty 1

## 2019-05-30 MED ORDER — CARBIDOPA-LEVODOPA ER 50-200 MG PO TBCR
1.0000 | EXTENDED_RELEASE_TABLET | Freq: Four times a day (QID) | ORAL | Status: DC
  Administered 2019-05-30 – 2019-06-01 (×7): 1 via ORAL
  Filled 2019-05-30 (×12): qty 1

## 2019-05-30 MED ORDER — HYDROXYZINE HCL 25 MG PO TABS
25.0000 mg | ORAL_TABLET | Freq: Every day | ORAL | Status: DC
  Administered 2019-05-30 – 2019-05-31 (×2): 25 mg via ORAL
  Filled 2019-05-30 (×2): qty 1

## 2019-05-30 MED ORDER — PRAMIPEXOLE DIHYDROCHLORIDE 0.25 MG PO TABS
0.7500 mg | ORAL_TABLET | Freq: Three times a day (TID) | ORAL | Status: DC
  Administered 2019-05-30 – 2019-06-01 (×5): 0.75 mg via ORAL
  Filled 2019-05-30 (×8): qty 3

## 2019-05-30 MED ORDER — PANTOPRAZOLE SODIUM 40 MG PO TBEC
40.0000 mg | DELAYED_RELEASE_TABLET | Freq: Every day | ORAL | Status: DC
  Administered 2019-05-30 – 2019-06-01 (×3): 40 mg via ORAL
  Filled 2019-05-30 (×3): qty 1

## 2019-05-30 MED ORDER — VALSARTAN-HYDROCHLOROTHIAZIDE 80-12.5 MG PO TABS: 1.0000 | ORAL_TABLET | Freq: Every day | ORAL | Status: DC

## 2019-05-30 MED ORDER — POLYVINYL ALCOHOL 1.4 % OP SOLN
1.0000 [drp] | Freq: Three times a day (TID) | OPHTHALMIC | Status: DC | PRN
  Filled 2019-05-30: qty 15

## 2019-05-30 NOTE — BH Assessment (Addendum)
Tele Assessment Note   Patient Name: Elijah [name] MRN: [id number] Referring Physician:  Roderic Palau Location of Patient: [facility] Location of Provider: [place] is an 79 y.o. male presenting voluntarily to [facility] ED. Patient is accompanied by his wife, Vaughan Basta, who participates in assessment. Patient is a poor historian due to dementia. He is able to tell me his full name and date of birth but is unable to tell me why he came to the hospital. Wife provides history for assessment.  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 He has been decompensating over the past 2 months. Patient is having visual and auditory hallucinations of people in their home. He is also delusional that she is having an affair. [name], broadcasting/film/video. Patient keeps leaving the home and wandering the neighborhood. Patient does not have a history of mental illness prior to this diagnosis.   Patient is alert and oriented x 2. He is dressed in scrubs, laying in hospital bed. His speech is soft, eye contact is poor. This clinician could not determine thought content. His mood is pleasant and his affect is appropriate. Patient has poor insight, judgement, and impulse control.  Diagnosis: Parkinson's related dementia  Past Medical History:  Past Medical History:  Diagnosis Date  . Chronic low back pain 03/24/2015  . Degenerative arthritis   . Diabetes mellitus without complication ([place])   . Dyslipidemia   . Glaucoma   . Gout 10/07/2012  . Hypertension   . Low back pain   . Obesity   . OSA (obstructive sleep apnea) 04/10/2014   cannot tolerate, PCP aware.  . Pancreatitis   . Parkinson disease ([place])   . Rotator cuff tear    Bilateral, no surgery    Past Surgical History:  Procedure Laterality Date  . AMPUTATION Right 01/31/2018    Procedure: AMPUTATION RIGHT THIRD TOE;  Surgeon: Caprice Beaver, DPM;  Location: [facility];  Service: Podiatry;  Laterality: Right;  . APPENDECTOMY    . BALLOON DILATION N/A 09/06/2015   Procedure: BALLOON DILATION;  Surgeon: Daneil Dolin, MD;  Location: [facility];  Service: Endoscopy;  Laterality: N/A;  . BILIARY STENT PLACEMENT N/A 09/08/2015   Procedure: BILIARY STENT PLACEMENT;  Surgeon: Daneil Dolin, MD;  Location: [facility];  Service: Endoscopy;  Laterality: N/A;  . ERCP N/A 09/06/2015   Procedure: ENDOSCOPIC RETROGRADE CHOLANGIOPANCREATOGRAPHY (ERCP);  Surgeon: Daneil Dolin, MD;  Location: [facility];  Service: Endoscopy;  Laterality: N/A;  . ERCP N/A 09/08/2015   Procedure: ENDOSCOPIC RETROGRADE CHOLANGIOPANCREATOGRAPHY (ERCP);  Surgeon: Daneil Dolin, MD;  Location: [facility];  Service: Endoscopy;  Laterality: N/A;  with bile duct brushings  . HERNIA REPAIR     umbilical  . SHOULDER BONE SPUR Left    RESECTION  . SPHINCTEROTOMY N/A 09/06/2015   Procedure: SPHINCTEROTOMY;  Surgeon: Daneil Dolin, MD;  Location: [facility];  Service: Endoscopy;  Laterality: N/A;  . TONSILLECTOMY      Family History:  Family History  Problem Relation Age of Onset  . Stroke Mother   . Diabetes Mother   . Heart Problems Mother   . Parkinsonism Father   . Heart Problems Father     Social History:  reports that he has never smoked. He has never  used smokeless tobacco. He reports that he does not drink alcohol or use drugs.  Additional Social History:  Alcohol / Drug Use Pain Medications: see MAR Prescriptions: see MAR Over the Counter: see MAR History of alcohol / drug use?: No history of alcohol / drug abuse  CIWA: CIWA-Ar BP: (!) 111/58 Pulse Rate: (!) 59 COWS:    Allergies:  Allergies  Allergen Reactions  . Aspirin Other (See Comments)    Stomach ulcers.  . Coconut Oil     Other reaction(s): GI Upset (intolerance)  . Penicillins Rash    Has patient had a  PCN reaction causing immediate rash, facial/tongue/throat swelling, SOB or lightheadedness with hypotension: no Has patient had a PCN reaction causing severe rash involving mucus membranes or skin necrosis: No Has patient had a PCN reaction that required hospitalization No Has patient had a PCN reaction occurring within the last 10 years: No If all of the above answers are "NO", then may proceed with Cephalosporin use.     Home Medications: (Not in a hospital admission)   OB/GYN Status:  No LMP for male patient.  General Assessment Data Location of Assessment: [facility] TTS Assessment: In system Is this a Tele or Face-to-Face Assessment?: Tele Assessment Is this an Initial Assessment or a Re-assessment for this encounter?: Initial Assessment Patient Accompanied by:: (wife) Language Other than English: No Living Arrangements: (private residence) What gender do you identify as?: Male Marital status: Married [place] name: Tomkiewicz Pregnancy Status: No Living Arrangements: Spouse/significant other Can pt return to current living arrangement?: Yes Admission Status: Voluntary Is patient capable of signing voluntary admission?: Yes Referral Source: Self/Family/Friend Insurance type: Equities trader     Crisis Care Plan Living Arrangements: Spouse/significant other Legal Guardian: (self) Name of Psychiatrist: none Name of Therapist: none  Education Status Is patient currently in school?: No Is the patient employed, unemployed or receiving disability?: Unemployed  Risk to self with the past 6 months Suicidal Ideation: No-Not Currently/Within Last 6 Months Has patient been a risk to self within the past 6 months prior to admission? : Yes Suicidal Intent: No-Not Currently/Within Last 6 Months Has patient had any suicidal intent within the past 6 months prior to admission? : Yes Is patient at risk for suicide?: Yes Suicidal Plan?: No-Not Currently/Within Last 6 Months Has patient  had any suicidal plan within the past 6 months prior to admission? : Yes Access to Means: Yes Specify Access to Suicidal Means: knives in home What has been your use of drugs/alcohol within the last 12 months?: denies Previous Attempts/Gestures: No How many times?: 0 Other Self Harm Risks: none Triggers for Past Attempts: None known Intentional Self Injurious Behavior: None Family Suicide History: No Recent stressful life event(s): Recent negative physical changes Persecutory voices/beliefs?: No Depression: Yes Depression Symptoms: Feeling angry/irritable, Feeling worthless/self pity, Isolating, Loss of interest in usual pleasures, Tearfulness Substance abuse history and/or treatment for substance abuse?: No Suicide prevention information given to non-admitted patients: Not applicable  Risk to Others within the past 6 months Homicidal Ideation: No Does patient have any lifetime risk of violence toward others beyond the six months prior to admission? : No Thoughts of Harm to Others: No Current Homicidal Intent: No Current Homicidal Plan: No Access to Homicidal Means: No Identified Victim: denies History of harm to others?: No Assessment of Violence: None Noted Violent Behavior Description: none Does patient have access to weapons?: No Criminal Charges Pending?: No Does patient have a court date: No Is patient on probation?:  No  Psychosis Hallucinations: Visual, Auditory Delusions: Unspecified  Mental Status Report Appearance/Hygiene: In scrubs Eye Contact: Poor Motor Activity: Freedom of movement Speech: Soft Level of Consciousness: Quiet/awake Mood: Pleasant Affect: Appropriate to circumstance Anxiety Level: Minimal Thought Processes: Coherent, Relevant Judgement: Impaired Orientation: Person, Place Obsessive Compulsive Thoughts/Behaviors: None  Cognitive Functioning Concentration: Poor Memory: Recent Impaired, Remote Impaired Is patient IDD: No Insight:  Poor Impulse Control: Poor Appetite: Fair Have you had any weight changes? : No Change Sleep: No Change Total Hours of Sleep: 8 Vegetative Symptoms: None  ADLScreening [facility] Assessment Services) Patient's cognitive ability adequate to safely complete daily activities?: Yes Patient able to express need for assistance with ADLs?: Yes Independently performs ADLs?: Yes (appropriate for developmental age)  Prior Inpatient Therapy Prior Inpatient Therapy: No  Prior Outpatient Therapy Prior Outpatient Therapy: No Does patient have an ACCT team?: No Does patient have Intensive In-House Services?  : No Does patient have [facility] services? : No Does patient have P4CC services?: No  ADL Screening (condition at time of admission) Patient's cognitive ability adequate to safely complete daily activities?: Yes Is the patient deaf or have difficulty hearing?: No Does the patient have difficulty seeing, even when wearing glasses/contacts?: No Does the patient have difficulty concentrating, remembering, or making decisions?: No Patient able to express need for assistance with ADLs?: Yes Does the patient have difficulty dressing or bathing?: No Independently performs ADLs?: Yes (appropriate for developmental age) Does the patient have difficulty walking or climbing stairs?: No Weakness of Legs: None Weakness of Arms/Hands: None  Home Assistive Devices/Equipment Home Assistive Devices/Equipment: None  Therapy Consults (therapy consults require a physician order) PT Evaluation Needed: No OT Evalulation Needed: No SLP Evaluation Needed: No Abuse/Neglect Assessment (Assessment to be complete while patient is alone) Abuse/Neglect Assessment Can Be Completed: Yes Physical Abuse: Denies Verbal Abuse: Denies Sexual Abuse: Denies Exploitation of patient/patient's resources: Denies Self-Neglect: Denies Values / Beliefs Cultural Requests During Hospitalization: None Spiritual Requests During  Hospitalization: None Consults Spiritual Care Consult Needed: No Transition of Care Team Consult Needed: No Advance Directives (For Healthcare) Does Patient Have a Medical Advance Directive?: No Would patient like information on creating a medical advance directive?: No - Patient declined          Disposition: Priscille Loveless, PMHNP recommends gero-psych Disposition Initial Assessment Completed for this Encounter: Yes  This service was provided via telemedicine using a 2-way, interactive audio and video technology.  Names of all persons participating in this telemedicine service and their role in this encounter. Name: Weston Whittaker Role: patient  Name: Lendon Ka Role: patient's wife  Name: Orvis Brill, LCSW Role: TTS  Name:  Role:     Orvis Brill 05/30/2019 2:30 PM

## 2019-05-30 NOTE — ED Notes (Signed)
Pt to CT

## 2019-05-30 NOTE — ED Provider Notes (Addendum)
[facility] EMERGENCY DEPARTMENT Provider Note   CSN: [id number] Arrival date & time: 05/30/19  1234     History Chief Complaint  Patient presents with  . V70.1  . Hallucinations    Elijah [name] is a 79 y.o. male.  Patient with Parkinson's disease.  He has been coming combative at home threatening to shoot himself threatening to harm his wife  The history is provided by the patient, a relative and medical records. No language interpreter was used.  Altered Mental Status Presenting symptoms: behavior changes   Severity:  Moderate Most recent episode:  2 days ago Episode history:  Continuous Timing:  Constant Progression:  Worsening Chronicity:  New Context: dementia   Associated symptoms: no abdominal pain, no hallucinations, no headaches, no rash and no seizures        Past Medical History:  Diagnosis Date  . Chronic low back pain 03/24/2015  . Degenerative arthritis   . Diabetes mellitus without complication ([place])   . Dyslipidemia   . Glaucoma   . Gout 10/07/2012  . Hypertension   . Low back pain   . Obesity   . OSA (obstructive sleep apnea) 04/10/2014   cannot tolerate, PCP aware.  . Pancreatitis   . Parkinson disease ([place])   . Rotator cuff tear    Bilateral, no surgery    Patient Active Problem List   Diagnosis Date Noted  . S/P ERCP   . Biliary obstruction   . Pancreatitis, acute   . Choledocholithiasis   . Total bilirubin, elevated   . Cholelithiasis   . Elevated LFTs   . Pancreatitis 09/02/2015  . Parkinson disease ([place])   . Diabetes mellitus without complication ([place])   . Hypertension   . Essential hypertension   . Jaundice   . Chronic low back pain 03/24/2015  . OSA (obstructive sleep apnea) 04/10/2014  . Paralysis agitans ([place]) 10/07/2012    Past Surgical History:  Procedure Laterality Date  . AMPUTATION Right 01/31/2018   Procedure: AMPUTATION RIGHT THIRD TOE;  Surgeon: Caprice Beaver, DPM;  Location: [facility];  Service: Podiatry;   Laterality: Right;  . APPENDECTOMY    . BALLOON DILATION N/A 09/06/2015   Procedure: BALLOON DILATION;  Surgeon: Daneil Dolin, MD;  Location: [facility];  Service: Endoscopy;  Laterality: N/A;  . BILIARY STENT PLACEMENT N/A 09/08/2015   Procedure: BILIARY STENT PLACEMENT;  Surgeon: Daneil Dolin, MD;  Location: [facility];  Service: Endoscopy;  Laterality: N/A;  . ERCP N/A 09/06/2015   Procedure: ENDOSCOPIC RETROGRADE CHOLANGIOPANCREATOGRAPHY (ERCP);  Surgeon: Daneil Dolin, MD;  Location[facility];  Service: Endoscopy;  Laterality: N/A;  . ERCP N/A 09/08/2015   Procedure: ENDOSCOPIC RETROGRADE CHOLANGIOPANCREATOGRAPHY (ERCP);  Surgeon: Daneil Dolin, MD;  Location: [facility];  Service: Endoscopy;  Laterality: N/A;  with bile duct brushings  . HERNIA REPAIR     umbilical  . SHOULDER BONE SPUR Left    RESECTION  . SPHINCTEROTOMY N/A 09/06/2015   Procedure: SPHINCTEROTOMY;  Surgeon: Daneil Dolin, MD;  Location: [facility];  Service: Endoscopy;  Laterality: N/A;  . TONSILLECTOMY         Family History  Problem Relation Age of Onset  . Stroke Mother   . Diabetes Mother   . Heart Problems Mother   . Parkinsonism Father   . Heart Problems Father     Social History   Tobacco Use  . Smoking status: Never Smoker  . Smokeless tobacco:  Never Used  Substance Use Topics  . Alcohol use: No  . Drug use: No    Home Medications Prior to Admission medications   Medication Sig Start Date End Date Taking? Authorizing Provider  ACCU-CHEK AVIVA PLUS test strip  12/05/13   [provider]  aspirin 81 MG tablet Take 81 mg by mouth daily.    [provider]  Azelastine HCl 0.15 % SOLN Place 1 spray into both nostrils daily as needed (congestion).  08/18/15   [provider]  carbidopa-levodopa (SINEMET CR) 50-200 MG tablet TAKE (1) TABLET BY MOUTH (4) TIMES DAILY. 03/19/19   Kathrynn Ducking, MD  Carboxymethylcellul-Glycerin (REFRESH OPTIVE) 1-0.9 %  GEL Place 1 drop into both eyes daily as needed (dry eyes).    [provider]  cyclobenzaprine (FLEXERIL) 10 MG tablet Take 1 tablet (10 mg total) by mouth 2 (two) times daily as needed for muscle spasms. Patient taking differently: Take 10 mg by mouth 3 (three) times daily as needed for muscle spasms.  07/02/12   Teressa Lower, MD  doxycycline (VIBRA-TABS) 100 MG tablet Take 100 mg by mouth 2 (two) times daily. 01/16/18   [provider]  furosemide (LASIX) 20 MG tablet Take 20 mg by mouth daily as needed for fluid or edema.  10/02/13   [provider]  hydrOXYzine (ATARAX/VISTARIL) 25 MG tablet Take 1 tablet (25 mg total) by mouth at bedtime. Take one hour before bedtime 05/14/19   Sater, Nanine Means, MD  LORazepam (ATIVAN) 1 MG tablet Take 1 mg by mouth daily. 12/31/17   [provider]  meloxicam (MOBIC) 15 MG tablet Take 15 mg by mouth daily as needed for pain.  09/30/12   [provider]  metFORMIN (GLUCOPHAGE) 500 MG tablet Take 500 mg by mouth daily with breakfast.  10/05/12   [provider]  metoprolol tartrate (LOPRESSOR) 25 MG tablet Take 12.5 mg by mouth daily.     [provider]  pantoprazole (PROTONIX) 40 MG tablet Take 40 mg by mouth daily. 03/08/14   [provider]  Pimavanserin Tartrate (NUPLAZID) 34 MG CAPS Take 1 capsule (34 mg total) by mouth at bedtime. 05/06/19   Kathrynn Ducking, MD  potassium chloride SA (K-DUR,KLOR-CON) 20 MEQ tablet Take 20 mEq by mouth daily.  10/24/13   [provider]  pramipexole (MIRAPEX) 0.75 MG tablet Take 1 tablet (0.75 mg total) by mouth 3 (three) times daily. 04/22/19   Kathrynn Ducking, MD  QUEtiapine (SEROQUEL) 25 MG tablet Take 2 tablets (50 mg total) by mouth at bedtime. 05/09/19   Kathrynn Ducking, MD  SALINE MIST SPRAY NA Place 1 spray into both nostrils daily as needed (congestion).     [provider]  selegiline (ELDEPRYL) 5 MG tablet TAKE 1 TABLET (5 MG  TOTAL) BY MOUTH 2 TIMES DAILY WITH A MEAL. 02/03/19   Kathrynn Ducking, MD  simvastatin (ZOCOR) 40 MG tablet Take 20 mg by mouth at bedtime.  09/05/12   [provider]  valsartan-hydrochlorothiazide (DIOVAN-HCT) 80-12.5 MG per tablet Take 1 tablet by mouth daily.    [provider]    Allergies    Aspirin, Coconut oil, and Penicillins  Review of Systems   Review of Systems  Constitutional: Negative for appetite change and fatigue.  HENT: Negative for congestion, ear discharge and sinus pressure.   Eyes: Negative for discharge.  Respiratory: Negative for cough.   Cardiovascular: Negative for chest pain.  Gastrointestinal: Negative for  abdominal pain and diarrhea.  Genitourinary: Negative for frequency and hematuria.  Musculoskeletal: Negative for back pain.  Skin: Negative for rash.  Neurological: Negative for seizures and headaches.  Psychiatric/Behavioral: Negative for hallucinations.    Physical Exam Updated Vital Signs BP (!) 111/58 (BP Location: Right Arm)   Pulse (!) 59   Temp 98 F (36.7 C) (Oral)   Resp 19   Ht 5\' 10"  (1.778 m)   Wt 89.8 kg   SpO2 97%   BMI 28.41 kg/m   Physical Exam Vitals and nursing note reviewed.  Constitutional:      Appearance: He is well-developed.  HENT:     Head: Normocephalic.     Nose: Nose normal.  Eyes:     General: No scleral icterus.    Conjunctiva/sclera: Conjunctivae normal.  Neck:     Thyroid: No thyromegaly.  Cardiovascular:     Rate and Rhythm: Normal rate and regular rhythm.     Heart sounds: No murmur. No friction rub. No gallop.   Pulmonary:     Breath sounds: No stridor. No wheezing or rales.  Chest:     Chest wall: No tenderness.  Abdominal:     General: There is no distension.     Tenderness: There is no abdominal tenderness. There is no rebound.  Musculoskeletal:        General: Normal range of motion.     Cervical back: Neck supple.  Lymphadenopathy:     Cervical: No cervical  adenopathy.  Skin:    Findings: No erythema or rash.  Neurological:     Mental Status: He is alert.     Motor: No abnormal muscle tone.     Coordination: Coordination normal.     Comments: Patient oriented to person and place  Psychiatric:     Comments: Patient denies suicidal ideations now     ED Results / Procedures / Treatments   Labs (all labs ordered are listed, but only abnormal results are displayed) Labs Reviewed  CBC WITH DIFFERENTIAL/PLATELET - Abnormal; Notable for the following components:      Result Value   RBC 3.69 (*)    Hemoglobin 11.7 (*)    HCT 34.9 (*)    All other components within normal limits  URINALYSIS, ROUTINE W REFLEX MICROSCOPIC  RAPID URINE DRUG SCREEN, HOSP PERFORMED  COMPREHENSIVE METABOLIC PANEL  ETHANOL    EKG None  Radiology No results found.  Procedures Procedures (including critical care time)  Medications Ordered in ED Medications - No data to display  ED Course  I have reviewed the triage vital signs and the nursing notes.  Pertinent labs & imaging results that were available during my care of the patient were reviewed by me and considered in my medical decision making (see chart for details).    MDM Rules/Calculators/A&P                      Patient with severe Parkinson's disease.  He also has dementia and has been combative and talking about killing himself.  Patient was brought in with commitment papers by family.  He has been seen by TTS and he will be placed at a [place] facility     This patient presents to the ED for concern of psychiatric issues and combative behavior from Parkinson's, this involves an extensive number of treatment options, and is a complaint that carries with it a high risk of complications and morbidity.  The differential diagnosis includes worsening  Parkinson's.  Stroke.  Chemical abnormalities   Lab Tests:   I Ordered, reviewed, and interpreted labs, which included UA CBC.  UA on  remarkable CBC shows anemia  Medicines ordered: Imaging Studies ordered:   I ordered imaging studies which included CT scan and it showed atrophy  I independently visualized and interpreted imaging which showed atrophy  Additional history obtained:   Additional history obtained from family member  Previous records obtained and reviewed   Consultations Obtained:   I consulted behavioral health had recommended to admit the patient to [facility] psych  Reevaluation:  Patient is stable and medically cleared and awaiting Ger[facility] psych placement  Critical Interventions:  .   Final Clinical Impression(s) / ED Diagnoses Final diagnoses:  None    Rx / DC Orders ED Discharge Orders    None       Milton Ferguson, MD 05/30/19 1454    Milton Ferguson, MD 05/30/19 1539
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
## 2019-05-30 NOTE — Progress Notes (Signed)
Patient meets inpatient criteria per Priscille Loveless, NP. Patient has been faxed out to the following facilities for review:   [facility]  [facility]  [facility]  CSW will continue to follow and assist with disposition planning.   Domenic Schwab, MSW, LCSW-A Clinical Disposition Social Worker [facility]/TTS [phone]

## 2019-05-30 NOTE — ED Notes (Signed)
Pt wanded by security. 

## 2019-05-30 NOTE — BHH Counselor (Signed)
Disposition: Priscille Loveless, PMHNP recommends gero-psych

## 2019-05-30 NOTE — ED Triage Notes (Signed)
Wife states he has Parkinson disease and dementia and lately he has been combative

## 2019-05-31 MED ORDER — PIMAVANSERIN TARTRATE 34 MG PO CAPS
34.0000 mg | ORAL_CAPSULE | Freq: Every day | ORAL | Status: DC
  Administered 2019-05-31: 34 mg via ORAL

## 2019-05-31 NOTE — ED Notes (Signed)
Resting quietly   Pt complains that bed is uncomfortable
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
## 2019-05-31 NOTE — Progress Notes (Signed)
Pt accepted to [facility] Elyse Jarvis is the accepting provider.  Call report to EG:[phone] Webb Silversmith, RN @ [facility] notified.   Pt is IVC  Pt may be transported by [facility]. Pt scheduled to arrive at the 900 unit on Sunday 06/01/19 after [place] MSW, [facility] Ph: [phone] Fax: [phone]  05/31/2019 12:41 PM

## 2019-05-31 NOTE — ED Notes (Signed)
[facility] called and was given information on patient and how he completes ADLs. [facility] to review patient's labs and vitals and will be talking to [facility] counselor about gathering more information and may have a bed available for him.

## 2019-05-31 NOTE — ED Notes (Signed)
Son is visiting pt

## 2019-05-31 NOTE — ED Notes (Addendum)
Pt has been accepted at [facility]  tomorrow after 11 am   [facility], [place]  Report number [phone]   Pt wife asks if she and his children moght see him prior to leaving due to distance to facility  She is told that they can come by this nurse due to pt age, dx and confusion As well as severity of illness

## 2019-05-31 NOTE — ED Notes (Signed)
Spouse and 2 children visiting pt

## 2019-05-31 NOTE — Consult Note (Signed)
Spouse brought in new med from home   Reports pt takes at lunch and this pharmacy does not carry and she was told by another nurse to bring frrom home   Call to pharm who will add to medication

## 2019-05-31 NOTE — ED Notes (Signed)
Snacks provided.

## 2019-06-01 DIAGNOSIS — E119 Type 2 diabetes mellitus without complications: Secondary | ICD-10-CM | POA: Diagnosis not present

## 2019-06-01 DIAGNOSIS — F419 Anxiety disorder, unspecified: Secondary | ICD-10-CM | POA: Diagnosis not present

## 2019-06-01 DIAGNOSIS — Z13220 Encounter for screening for lipoid disorders: Secondary | ICD-10-CM | POA: Diagnosis not present

## 2019-06-01 DIAGNOSIS — Z712 Person consulting for explanation of examination or test findings: Secondary | ICD-10-CM | POA: Diagnosis not present

## 2019-06-01 DIAGNOSIS — Z131 Encounter for screening for diabetes mellitus: Secondary | ICD-10-CM | POA: Diagnosis not present

## 2019-06-01 DIAGNOSIS — G20 Parkinson's disease: Secondary | ICD-10-CM | POA: Diagnosis not present

## 2019-06-01 DIAGNOSIS — R251 Tremor, unspecified: Secondary | ICD-10-CM | POA: Diagnosis not present

## 2019-06-01 DIAGNOSIS — G8929 Other chronic pain: Secondary | ICD-10-CM | POA: Diagnosis not present

## 2019-06-01 DIAGNOSIS — Z113 Encounter for screening for infections with a predominantly sexual mode of transmission: Secondary | ICD-10-CM | POA: Diagnosis not present

## 2019-06-01 DIAGNOSIS — F0391 Unspecified dementia with behavioral disturbance: Secondary | ICD-10-CM | POA: Diagnosis not present

## 2019-06-01 DIAGNOSIS — I6782 Cerebral ischemia: Secondary | ICD-10-CM | POA: Diagnosis not present

## 2019-06-01 DIAGNOSIS — R8290 Unspecified abnormal findings in urine: Secondary | ICD-10-CM | POA: Diagnosis not present

## 2019-06-01 DIAGNOSIS — Z13228 Encounter for screening for other metabolic disorders: Secondary | ICD-10-CM | POA: Diagnosis not present

## 2019-06-01 DIAGNOSIS — Z5181 Encounter for therapeutic drug level monitoring: Secondary | ICD-10-CM | POA: Diagnosis not present

## 2019-06-01 DIAGNOSIS — E785 Hyperlipidemia, unspecified: Secondary | ICD-10-CM | POA: Diagnosis not present

## 2019-06-01 DIAGNOSIS — I428 Other cardiomyopathies: Secondary | ICD-10-CM | POA: Diagnosis not present

## 2019-06-01 DIAGNOSIS — Z79899 Other long term (current) drug therapy: Secondary | ICD-10-CM | POA: Diagnosis not present

## 2019-06-01 DIAGNOSIS — I10 Essential (primary) hypertension: Secondary | ICD-10-CM | POA: Diagnosis not present

## 2019-06-01 DIAGNOSIS — K5909 Other constipation: Secondary | ICD-10-CM | POA: Diagnosis not present

## 2019-06-01 DIAGNOSIS — D510 Vitamin B12 deficiency anemia due to intrinsic factor deficiency: Secondary | ICD-10-CM | POA: Diagnosis not present

## 2019-06-01 DIAGNOSIS — E559 Vitamin D deficiency, unspecified: Secondary | ICD-10-CM | POA: Diagnosis not present

## 2019-06-01 DIAGNOSIS — Z1329 Encounter for screening for other suspected endocrine disorder: Secondary | ICD-10-CM | POA: Diagnosis not present

## 2019-06-01 DIAGNOSIS — F23 Brief psychotic disorder: Secondary | ICD-10-CM | POA: Diagnosis not present

## 2019-06-01 DIAGNOSIS — Z130 Encounter for screening for diseases of the blood and blood-forming organs and certain disorders involving the immune mechanism: Secondary | ICD-10-CM | POA: Diagnosis not present

## 2019-06-01 DIAGNOSIS — N390 Urinary tract infection, site not specified: Secondary | ICD-10-CM | POA: Diagnosis not present

## 2019-06-01 DIAGNOSIS — R946 Abnormal results of thyroid function studies: Secondary | ICD-10-CM | POA: Diagnosis not present

## 2019-06-01 NOTE — ED Notes (Signed)
[facility]. Copies of IVC made and put with paperwork for [facility].

## 2019-06-01 NOTE — ED Notes (Signed)
Report given to Jake Seats, Therapist, sports at [facility] at this time.

## 2019-06-01 NOTE — ED Provider Notes (Signed)
[facility].  Patient is IVC.  Requires admission for Parkinson's dementia hallucinations and threatening to kill himself.  Transfer paperwork has been completed.  He has been accepted at that facility by Cristela Felt.  Patient currently stable.  No acute distress.   Fredia Sorrow, MD 06/01/19 1020

## 2019-06-01 NOTE — ED Provider Notes (Signed)
The patient has been placed in psychiatric observation due to the need to provide a safe environment for the patient while obtaining psychiatric consultation and evaluation, as well as ongoing medical and medication management to treat the patient's condition.  The patient has been placed under full IVC at this time.  BP 124/67 (BP Location: Right Arm)   Pulse 63   Temp 98 F (36.7 C) (Oral)   Resp 18   Ht 1.778 m (5\' 10" )   Wt 89.8 kg   SpO2 99%   BMI 28.41 kg/m  Pt stable at this time awaiting placement Labs and vitals reviewed   Ripley Fraise, MD 06/01/19 0010

## 2019-06-02 ENCOUNTER — Telehealth: Payer: Self-pay | Admitting: Neurology

## 2019-06-02 NOTE — Telephone Encounter (Signed)
Events noted, I did not call the wife, behavioral admission at this point is appropriate, hopefully medication adjustments will make more of a long-term improvement in his behavior.
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
## 2019-06-09 DIAGNOSIS — I10 Essential (primary) hypertension: Secondary | ICD-10-CM | POA: Diagnosis not present

## 2019-06-09 DIAGNOSIS — R443 Hallucinations, unspecified: Secondary | ICD-10-CM | POA: Diagnosis not present

## 2019-06-09 DIAGNOSIS — F0391 Unspecified dementia with behavioral disturbance: Secondary | ICD-10-CM | POA: Diagnosis not present

## 2019-06-09 DIAGNOSIS — G20 Parkinson's disease: Secondary | ICD-10-CM | POA: Diagnosis not present

## 2019-06-10 NOTE — Telephone Encounter (Signed)
I tried to call pts wife about dosage change of sinemet. PT was recently admitted to behavior unit. I called the phone three times and each time I got a busy signal.

## 2019-06-10 NOTE — Telephone Encounter (Signed)
Irmen,Linda(wife on DPR)has called to report that while pt was at [facility] they changed pt's carbidopa-levodopa (SINEMET CR) 50-200 MG tablet to 50/200 to 25/100 she wanted Dr Jannifer Franklin to be aware and wants a call to know if he wants to change again, please call(wife aware Dr Jannifer Franklin is not in the office this week)

## 2019-06-11 NOTE — Telephone Encounter (Signed)
I called and left a message.  The alteration in Sinemet dosing may be an attempt to improve the psychosis, the Mirapex dose could be reduced as well.  I am not sure what the Sinemet dose is currently.

## 2019-06-11 NOTE — Telephone Encounter (Signed)
Pt's wife returned call. Please call back when available.  °

## 2019-06-12 DIAGNOSIS — F23 Brief psychotic disorder: Secondary | ICD-10-CM | POA: Diagnosis not present

## 2019-06-13 NOTE — Telephone Encounter (Signed)
I called and talk with the wife.  The patient is back home now, he is off of the Nuplazid and they started him on Zyprexa and Lexapro.  He will need an earlier revisit in August, I will try to get this set up.

## 2019-06-16 NOTE — Telephone Encounter (Signed)
I called pts wife about coming in for an appt today at 80 for a cancellation Dr. Jannifer Franklin had. The wife stated she was babysitting her grandchild now and would be unable to come today.

## 2019-06-23 DIAGNOSIS — I10 Essential (primary) hypertension: Secondary | ICD-10-CM | POA: Diagnosis not present

## 2019-06-23 DIAGNOSIS — R443 Hallucinations, unspecified: Secondary | ICD-10-CM | POA: Diagnosis not present

## 2019-06-23 DIAGNOSIS — G20 Parkinson's disease: Secondary | ICD-10-CM | POA: Diagnosis not present

## 2019-06-23 DIAGNOSIS — F0391 Unspecified dementia with behavioral disturbance: Secondary | ICD-10-CM | POA: Diagnosis not present

## 2019-06-27 DIAGNOSIS — G20 Parkinson's disease: Secondary | ICD-10-CM | POA: Diagnosis not present

## 2019-06-27 DIAGNOSIS — I10 Essential (primary) hypertension: Secondary | ICD-10-CM | POA: Diagnosis not present

## 2019-06-27 DIAGNOSIS — K828 Other specified diseases of gallbladder: Secondary | ICD-10-CM | POA: Diagnosis not present

## 2019-06-27 DIAGNOSIS — M1990 Unspecified osteoarthritis, unspecified site: Secondary | ICD-10-CM | POA: Diagnosis not present

## 2019-06-27 DIAGNOSIS — L603 Nail dystrophy: Secondary | ICD-10-CM | POA: Diagnosis not present

## 2019-06-27 DIAGNOSIS — E119 Type 2 diabetes mellitus without complications: Secondary | ICD-10-CM | POA: Diagnosis not present

## 2019-06-27 DIAGNOSIS — E1142 Type 2 diabetes mellitus with diabetic polyneuropathy: Secondary | ICD-10-CM | POA: Diagnosis not present

## 2019-06-27 DIAGNOSIS — E11621 Type 2 diabetes mellitus with foot ulcer: Secondary | ICD-10-CM | POA: Diagnosis not present

## 2019-07-14 ENCOUNTER — Telehealth: Payer: Self-pay | Admitting: Neurology

## 2019-07-14 MED ORDER — CARBIDOPA-LEVODOPA ER 25-100 MG PO TBCR: EXTENDED_RELEASE_TABLET | ORAL | 5 refills | Status: DC

## 2019-07-14 NOTE — Telephone Encounter (Signed)
I called the wife.  The patient is doing better with mental status but his Sinemet was cut back significantly when in the hospital.  He is now taking 25 100 mg 4 times daily instead of the 50/200 mg 4 times daily.  The wife is breaking the CR tablets in half.  I will convert him to the 25/100 mg CR tablets taking 2 in the morning, 1 at midday and dinner and bedtime.  The patient has had significant slowing of his activities, it takes him an hour to eat, he is having increased tremor, basically having worsening of his parkinsonian features.  His mental status however is better on the lower dose of Sinemet.  We will try to get him worked into the office with a cancellation.

## 2019-07-14 NOTE — Addendum Note (Signed)
Addended by: Kathrynn Ducking on: 07/14/2019 04:58 PM   Modules accepted: Orders

## 2019-07-14 NOTE — Telephone Encounter (Signed)
PT already on waiting list for cancellation.

## 2019-07-14 NOTE — Telephone Encounter (Signed)
Kellen,Linda(wife) is asking for a call from RN to discuss pt's trembling worsening.  Wife stated Dr Jannifer Franklin wanted to see pt re: his trembling.  Please call

## 2019-07-15 NOTE — Telephone Encounter (Signed)
I called pts wife that Dr.Willis had a cancellation at 1000am today.I stated her husband name is on the waiting list. The wife stated her husband just got up and she could not be here at 1000am. I stated pts name will continue to be on wailing list.

## 2019-07-22 DIAGNOSIS — E11621 Type 2 diabetes mellitus with foot ulcer: Secondary | ICD-10-CM | POA: Diagnosis not present

## 2019-07-22 DIAGNOSIS — G20 Parkinson's disease: Secondary | ICD-10-CM | POA: Diagnosis not present

## 2019-07-22 DIAGNOSIS — M1990 Unspecified osteoarthritis, unspecified site: Secondary | ICD-10-CM | POA: Diagnosis not present

## 2019-07-22 DIAGNOSIS — I10 Essential (primary) hypertension: Secondary | ICD-10-CM | POA: Diagnosis not present

## 2019-07-22 DIAGNOSIS — K828 Other specified diseases of gallbladder: Secondary | ICD-10-CM | POA: Diagnosis not present

## 2019-07-22 DIAGNOSIS — E119 Type 2 diabetes mellitus without complications: Secondary | ICD-10-CM | POA: Diagnosis not present

## 2019-08-01 ENCOUNTER — Telehealth: Payer: Self-pay | Admitting: Neurology

## 2019-08-01 NOTE — Telephone Encounter (Signed)
[name] @ [facility] [address] has called asking for a refill to be sent to them for pramipexole (MIRAPEX) 0.75 MG tablet

## 2019-08-04 ENCOUNTER — Other Ambulatory Visit: Payer: Self-pay

## 2019-08-04 MED ORDER — PRAMIPEXOLE DIHYDROCHLORIDE 0.75 MG PO TABS: 0.7500 mg | ORAL_TABLET | Freq: Three times a day (TID) | ORAL | 1 refills | Status: DC

## 2019-08-04 NOTE — Telephone Encounter (Signed)
[facility]

## 2019-08-14 ENCOUNTER — Ambulatory Visit: Payer: PPO | Admitting: Neurology

## 2019-08-14 ENCOUNTER — Encounter: Payer: Self-pay | Admitting: Neurology

## 2019-08-14 ENCOUNTER — Other Ambulatory Visit: Payer: Self-pay

## 2019-08-14 VITALS — BP 111/64 | HR 61 | Wt 200.0 lb

## 2019-08-14 DIAGNOSIS — G20 Parkinson's disease: Secondary | ICD-10-CM | POA: Diagnosis not present

## 2019-08-14 MED ORDER — CARBIDOPA-LEVODOPA ER 25-100 MG PO TBCR: EXTENDED_RELEASE_TABLET | ORAL | 1 refills | Status: DC

## 2019-08-14 NOTE — Progress Notes (Signed)
Reason for visit: Parkinson's disease  Elijah [name] is an 79 y.o. male  History of present illness:  [name] is a 79 year old left-handed white male with a history of Parkinson's disease.  The patient developed a psychosis on his Parkinson's medications, he was being switched over to Nuplazid and had severe worsening of his hallucinations and delusional behavior, he became somewhat agitated and threatening.  He required hospitalization for observation, he was cut back on his Sinemet dosing significantly and was placed on Zyprexa 10 mg at night.  He could not really tolerate doses greater than 25 mg of Seroquel previously.  The patient is doing much better with his hallucinations and delusional thinking.  He is on Zyprexa 10 mg at night, he takes Sinemet CR 25/100 tablets, 2 in the morning, 1 at midday, 1 at dinner and 1 at bedtime.  The patient has had some increased mobility problems, difficulty getting up out of a chair.  The patient is not any falls, he uses a walker for ambulation.  He has severe low back issues, he cannot stand up straight, he has scoliosis, his back twists to the right.  He denies any issues with swallowing or choking.  He returns for an evaluation.  Past Medical History:  Diagnosis Date  . Chronic low back pain 03/24/2015  . Degenerative arthritis   . Diabetes mellitus without complication ([place])   . Dyslipidemia   . Glaucoma   . Gout 10/07/2012  . Hypertension   . Low back pain   . Obesity   . OSA (obstructive sleep apnea) 04/10/2014   cannot tolerate, PCP aware.  . Pancreatitis   . Parkinson disease ([place])   . Rotator cuff tear    Bilateral, no surgery    Past Surgical History:  Procedure Laterality Date  . AMPUTATION Right 01/31/2018   Procedure: AMPUTATION RIGHT THIRD TOE;  Surgeon: Caprice Beaver, DPM;  Location: [facility];  Service: Podiatry;  Laterality: Right;  . APPENDECTOMY    . BALLOON DILATION N/A 09/06/2015   Procedure: BALLOON DILATION;   Surgeon: Daneil Dolin, MD;  [facility];  Service: Endoscopy;  Laterality: N/A;  . BILIARY STENT PLACEMENT N/A 09/08/2015   Procedure: BILIARY STENT PLACEMENT;  Surgeon: Daneil Dolin, MD;  [facility];  Service: Endoscopy;  Laterality: N/A;  . ERCP N/A 09/06/2015   Procedure: ENDOSCOPIC RETROGRADE CHOLANGIOPANCREATOGRAPHY (ERCP);  Surgeon: Daneil Dolin, MD;  [facility];  Service: Endoscopy;  Laterality: N/A;  . ERCP N/A 09/08/2015   Procedure: ENDOSCOPIC RETROGRADE CHOLANGIOPANCREATOGRAPHY (ERCP);  Surgeon: Daneil Dolin, MD;  [facility];  Service: Endoscopy;  Laterality: N/A;  with bile duct brushings  . HERNIA REPAIR     umbilical  . SHOULDER BONE SPUR Left    RESECTION  . SPHINCTEROTOMY N/A 09/06/2015   Procedure: SPHINCTEROTOMY;  Surgeon: Daneil Dolin, MD;  [facility];  Service: Endoscopy;  Laterality: N/A;  . TONSILLECTOMY      Family History  Problem Relation Age of Onset  . Stroke Mother   . Diabetes Mother   . Heart Problems Mother   . Parkinsonism Father   . Heart Problems Father     Social history:  reports that he has never smoked. He has never used smokeless tobacco. He reports that he does not drink alcohol and does not use drugs.    Allergies  Allergen Reactions  . Aspirin Other (See Comments)  Stomach ulcers.  . Coconut Oil     Other reaction(s): GI Upset (intolerance)  . Penicillins Rash    Has patient had a PCN reaction causing immediate rash, facial/tongue/throat swelling, SOB or lightheadedness with hypotension: no Has patient had a PCN reaction causing severe rash involving mucus membranes or skin necrosis: No Has patient had a PCN reaction that required hospitalization No Has patient had a PCN reaction occurring within the last 10 years: No If all of the above answers are "NO", then may proceed with Cephalosporin use.     Medications:  Prior to Admission medications   Medication Sig  Start Date End Date Taking? Authorizing Provider  aspirin 81 MG tablet Take 81 mg by mouth daily.   Yes [provider]  Azelastine HCl 0.15 % SOLN Place 1 spray into both nostrils daily as needed (congestion).  08/18/15  Yes [provider]  Carbidopa-Levodopa ER (SINEMET CR) 25-100 MG tablet controlled release 2 tablets in the morning, 1 tablet at midday, dinner, and bedtime 07/14/19  Yes Kathrynn Ducking, MD  Carboxymethylcellul-Glycerin (REFRESH OPTIVE) 1-0.9 % GEL Place 1 drop into both eyes daily as needed (dry eyes).   Yes [provider]  cyclobenzaprine (FLEXERIL) 10 MG tablet Take 10 mg by mouth as needed for muscle spasms.   Yes [provider]  escitalopram (LEXAPRO) 10 MG tablet Take 10 mg by mouth daily. 07/30/19  Yes [provider]  furosemide (LASIX) 20 MG tablet Take 20 mg by mouth as needed.   Yes [provider]  meloxicam (MOBIC) 15 MG tablet Take 15 mg by mouth as needed for pain.   Yes [provider]  metFORMIN (GLUCOPHAGE) 500 MG tablet Take 500 mg by mouth daily with breakfast.  10/05/12  Yes [provider]  metoprolol tartrate (LOPRESSOR) 25 MG tablet Take 12.5 mg by mouth daily.    Yes [provider]  OLANZapine (ZYPREXA) 10 MG tablet Take 10 mg by mouth at bedtime. 07/30/19  Yes [provider]  pantoprazole (PROTONIX) 40 MG tablet Take 40 mg by mouth daily. 03/08/14  Yes [provider]  potassium chloride SA (K-DUR,KLOR-CON) 20 MEQ tablet Take 20 mEq by mouth as needed.  10/24/13  Yes [provider]  pramipexole (MIRAPEX) 0.25 MG tablet Take 0.75 mg by mouth 3 (three) times daily. 06/06/19  Yes [provider]  pramipexole (MIRAPEX) 0.75 MG tablet Take 1 tablet (0.75 mg total) by mouth 3 (three) times daily. 08/04/19  Yes Kathrynn Ducking, MD  simvastatin (ZOCOR) 40 MG tablet Take 20 mg by mouth at bedtime.  09/05/12  Yes [provider]    valsartan-hydrochlorothiazide (DIOVAN-HCT) 80-12.5 MG per tablet Take 1 tablet by mouth daily.   Yes [provider]    ROS:  Out of a complete 14 system review of symptoms, the patient complains only of the following symptoms, and all other reviewed systems are negative.  Walking difficulty Back pain History of hallucinations  Blood pressure 111/64, pulse 61, weight 200 lb (90.7 kg).  Physical Exam  General: The patient is alert and cooperative at the time of the examination.  The patient is moderately obese.  Skin: 1+ edema in the lower legs is noted bilaterally.   Neurologic Exam  Mental status: The patient is alert and oriented x 3 at the time of the examination. The patient has apparent normal recent and remote memory, with an apparently normal attention span and concentration ability.   Cranial nerves: Facial  symmetry is present. Speech is normal, no aphasia or dysarthria is noted. Extraocular movements are full. Visual fields are full.  Motor: The patient has good strength in all 4 extremities.  Sensory examination: Soft touch sensation is symmetric on the face, arms, and legs.  Coordination: The patient has good finger-nose-finger and heel-to-shin bilaterally.  Gait and station: The patient has to push off in order to stand up out of a chair.  Once up, he can walk with a walker, he is quite stooped, back is twisted to the right.  He has to walk off-center with the walker, he has a lot of difficulty with turns with freezing.  He has resting tremors worse on the left arm than the right.  Reflexes: Deep tendon reflexes are symmetric.   Assessment/Plan:  1.  Parkinson's disease  2.  Gait disturbance  3.  History of psychosis and delusional behavior  The patient is doing well on the Zyprexa, we will continue the current dose, we will go up on Sinemet slightly taking 2 tablets in the morning and midday, and 1 tablet at dinner and bedtime.  The patient was  sent in a prescription for this.  He will remain on Mirapex taking 0.75 mg 3 times daily.  He will come back here in 5 months.  If the functional level significantly worsens, we may need to retry him on the Nuplazid.  Jill Alexanders MD 08/14/2019 10:52 AM  [facility] [address] [place], [place] [address]  Phone [phone] Fax [phone]

## 2019-08-14 NOTE — Patient Instructions (Signed)
We will increase the Sinemet 25/100 mg tablets, take 2 in the morning and midday, one at dinner and one at bedtime.

## 2019-09-02 DIAGNOSIS — I10 Essential (primary) hypertension: Secondary | ICD-10-CM | POA: Diagnosis not present

## 2019-09-02 DIAGNOSIS — K828 Other specified diseases of gallbladder: Secondary | ICD-10-CM | POA: Diagnosis not present

## 2019-09-02 DIAGNOSIS — G20 Parkinson's disease: Secondary | ICD-10-CM | POA: Diagnosis not present

## 2019-09-02 DIAGNOSIS — M1990 Unspecified osteoarthritis, unspecified site: Secondary | ICD-10-CM | POA: Diagnosis not present

## 2019-09-02 DIAGNOSIS — E119 Type 2 diabetes mellitus without complications: Secondary | ICD-10-CM | POA: Diagnosis not present

## 2019-09-02 DIAGNOSIS — E11621 Type 2 diabetes mellitus with foot ulcer: Secondary | ICD-10-CM | POA: Diagnosis not present

## 2019-09-05 DIAGNOSIS — L603 Nail dystrophy: Secondary | ICD-10-CM | POA: Diagnosis not present

## 2019-09-05 DIAGNOSIS — E1142 Type 2 diabetes mellitus with diabetic polyneuropathy: Secondary | ICD-10-CM | POA: Diagnosis not present

## 2019-09-22 DIAGNOSIS — I10 Essential (primary) hypertension: Secondary | ICD-10-CM | POA: Diagnosis not present

## 2019-09-22 DIAGNOSIS — G20 Parkinson's disease: Secondary | ICD-10-CM | POA: Diagnosis not present

## 2019-09-22 DIAGNOSIS — E11621 Type 2 diabetes mellitus with foot ulcer: Secondary | ICD-10-CM | POA: Diagnosis not present

## 2019-09-22 DIAGNOSIS — E119 Type 2 diabetes mellitus without complications: Secondary | ICD-10-CM | POA: Diagnosis not present

## 2019-09-22 DIAGNOSIS — K828 Other specified diseases of gallbladder: Secondary | ICD-10-CM | POA: Diagnosis not present

## 2019-09-22 DIAGNOSIS — M1990 Unspecified osteoarthritis, unspecified site: Secondary | ICD-10-CM | POA: Diagnosis not present

## 2019-09-24 ENCOUNTER — Ambulatory Visit: Payer: PPO | Admitting: Neurology

## 2019-10-01 DIAGNOSIS — E1169 Type 2 diabetes mellitus with other specified complication: Secondary | ICD-10-CM | POA: Diagnosis not present

## 2019-10-01 DIAGNOSIS — E785 Hyperlipidemia, unspecified: Secondary | ICD-10-CM | POA: Diagnosis not present

## 2019-10-01 DIAGNOSIS — Z1321 Encounter for screening for nutritional disorder: Secondary | ICD-10-CM | POA: Diagnosis not present

## 2019-10-01 DIAGNOSIS — E11621 Type 2 diabetes mellitus with foot ulcer: Secondary | ICD-10-CM | POA: Diagnosis not present

## 2019-10-01 DIAGNOSIS — I10 Essential (primary) hypertension: Secondary | ICD-10-CM | POA: Diagnosis not present

## 2019-10-01 DIAGNOSIS — E782 Mixed hyperlipidemia: Secondary | ICD-10-CM | POA: Diagnosis not present

## 2019-10-01 DIAGNOSIS — Z125 Encounter for screening for malignant neoplasm of prostate: Secondary | ICD-10-CM | POA: Diagnosis not present

## 2019-10-03 DIAGNOSIS — K828 Other specified diseases of gallbladder: Secondary | ICD-10-CM | POA: Diagnosis not present

## 2019-10-03 DIAGNOSIS — M1990 Unspecified osteoarthritis, unspecified site: Secondary | ICD-10-CM | POA: Diagnosis not present

## 2019-10-03 DIAGNOSIS — G20 Parkinson's disease: Secondary | ICD-10-CM | POA: Diagnosis not present

## 2019-10-03 DIAGNOSIS — G894 Chronic pain syndrome: Secondary | ICD-10-CM | POA: Diagnosis not present

## 2019-10-03 DIAGNOSIS — E11621 Type 2 diabetes mellitus with foot ulcer: Secondary | ICD-10-CM | POA: Diagnosis not present

## 2019-10-03 DIAGNOSIS — Z0001 Encounter for general adult medical examination with abnormal findings: Secondary | ICD-10-CM | POA: Diagnosis not present

## 2019-10-03 DIAGNOSIS — E1169 Type 2 diabetes mellitus with other specified complication: Secondary | ICD-10-CM | POA: Diagnosis not present

## 2019-10-03 DIAGNOSIS — F0391 Unspecified dementia with behavioral disturbance: Secondary | ICD-10-CM | POA: Diagnosis not present

## 2019-10-03 DIAGNOSIS — I10 Essential (primary) hypertension: Secondary | ICD-10-CM | POA: Diagnosis not present

## 2019-10-03 DIAGNOSIS — Z89421 Acquired absence of other right toe(s): Secondary | ICD-10-CM | POA: Diagnosis not present

## 2019-10-03 DIAGNOSIS — E119 Type 2 diabetes mellitus without complications: Secondary | ICD-10-CM | POA: Diagnosis not present

## 2019-10-03 DIAGNOSIS — E782 Mixed hyperlipidemia: Secondary | ICD-10-CM | POA: Diagnosis not present

## 2019-10-03 DIAGNOSIS — R443 Hallucinations, unspecified: Secondary | ICD-10-CM | POA: Diagnosis not present

## 2019-10-03 DIAGNOSIS — K839 Disease of biliary tract, unspecified: Secondary | ICD-10-CM | POA: Diagnosis not present

## 2019-10-08 ENCOUNTER — Ambulatory Visit: Payer: PPO | Admitting: Neurology

## 2019-10-23 DIAGNOSIS — Z0001 Encounter for general adult medical examination with abnormal findings: Secondary | ICD-10-CM | POA: Diagnosis not present

## 2019-10-23 DIAGNOSIS — G20 Parkinson's disease: Secondary | ICD-10-CM | POA: Diagnosis not present

## 2019-10-23 DIAGNOSIS — E119 Type 2 diabetes mellitus without complications: Secondary | ICD-10-CM | POA: Diagnosis not present

## 2019-10-23 DIAGNOSIS — M1990 Unspecified osteoarthritis, unspecified site: Secondary | ICD-10-CM | POA: Diagnosis not present

## 2019-10-23 DIAGNOSIS — I10 Essential (primary) hypertension: Secondary | ICD-10-CM | POA: Diagnosis not present

## 2019-10-23 DIAGNOSIS — K828 Other specified diseases of gallbladder: Secondary | ICD-10-CM | POA: Diagnosis not present

## 2019-10-23 DIAGNOSIS — E11621 Type 2 diabetes mellitus with foot ulcer: Secondary | ICD-10-CM | POA: Diagnosis not present

## 2019-11-14 DIAGNOSIS — L603 Nail dystrophy: Secondary | ICD-10-CM | POA: Diagnosis not present

## 2019-11-14 DIAGNOSIS — E1142 Type 2 diabetes mellitus with diabetic polyneuropathy: Secondary | ICD-10-CM | POA: Diagnosis not present

## 2019-11-27 DIAGNOSIS — M1990 Unspecified osteoarthritis, unspecified site: Secondary | ICD-10-CM | POA: Diagnosis not present

## 2019-11-27 DIAGNOSIS — K828 Other specified diseases of gallbladder: Secondary | ICD-10-CM | POA: Diagnosis not present

## 2019-11-27 DIAGNOSIS — E119 Type 2 diabetes mellitus without complications: Secondary | ICD-10-CM | POA: Diagnosis not present

## 2019-11-27 DIAGNOSIS — G20 Parkinson's disease: Secondary | ICD-10-CM | POA: Diagnosis not present

## 2019-11-27 DIAGNOSIS — I10 Essential (primary) hypertension: Secondary | ICD-10-CM | POA: Diagnosis not present

## 2019-11-27 DIAGNOSIS — E11621 Type 2 diabetes mellitus with foot ulcer: Secondary | ICD-10-CM | POA: Diagnosis not present

## 2019-11-27 DIAGNOSIS — Z0001 Encounter for general adult medical examination with abnormal findings: Secondary | ICD-10-CM | POA: Diagnosis not present

## 2019-12-10 DIAGNOSIS — Z23 Encounter for immunization: Secondary | ICD-10-CM | POA: Diagnosis not present

## 2019-12-24 DIAGNOSIS — E7849 Other hyperlipidemia: Secondary | ICD-10-CM | POA: Diagnosis not present

## 2019-12-24 DIAGNOSIS — I10 Essential (primary) hypertension: Secondary | ICD-10-CM | POA: Diagnosis not present

## 2019-12-24 DIAGNOSIS — E119 Type 2 diabetes mellitus without complications: Secondary | ICD-10-CM | POA: Diagnosis not present

## 2019-12-24 DIAGNOSIS — M1991 Primary osteoarthritis, unspecified site: Secondary | ICD-10-CM | POA: Diagnosis not present

## 2020-01-16 ENCOUNTER — Other Ambulatory Visit: Payer: Self-pay | Admitting: Neurology

## 2020-01-19 ENCOUNTER — Other Ambulatory Visit: Payer: Self-pay | Admitting: Neurology

## 2020-01-23 DIAGNOSIS — E1142 Type 2 diabetes mellitus with diabetic polyneuropathy: Secondary | ICD-10-CM | POA: Diagnosis not present

## 2020-01-23 DIAGNOSIS — L603 Nail dystrophy: Secondary | ICD-10-CM | POA: Diagnosis not present

## 2020-02-04 DIAGNOSIS — I739 Peripheral vascular disease, unspecified: Secondary | ICD-10-CM | POA: Diagnosis not present

## 2020-02-20 DIAGNOSIS — K828 Other specified diseases of gallbladder: Secondary | ICD-10-CM | POA: Diagnosis not present

## 2020-02-20 DIAGNOSIS — I10 Essential (primary) hypertension: Secondary | ICD-10-CM | POA: Diagnosis not present

## 2020-02-20 DIAGNOSIS — G20 Parkinson's disease: Secondary | ICD-10-CM | POA: Diagnosis not present

## 2020-02-20 DIAGNOSIS — E119 Type 2 diabetes mellitus without complications: Secondary | ICD-10-CM | POA: Diagnosis not present

## 2020-02-20 DIAGNOSIS — E11621 Type 2 diabetes mellitus with foot ulcer: Secondary | ICD-10-CM | POA: Diagnosis not present

## 2020-02-20 DIAGNOSIS — M1990 Unspecified osteoarthritis, unspecified site: Secondary | ICD-10-CM | POA: Diagnosis not present

## 2020-02-25 ENCOUNTER — Encounter: Payer: Self-pay | Admitting: Neurology

## 2020-02-25 ENCOUNTER — Other Ambulatory Visit: Payer: Self-pay

## 2020-02-25 ENCOUNTER — Ambulatory Visit: Payer: PPO | Admitting: Neurology

## 2020-02-25 VITALS — BP 155/76 | HR 71 | Ht 70.0 in | Wt 220.0 lb

## 2020-02-25 DIAGNOSIS — R269 Unspecified abnormalities of gait and mobility: Secondary | ICD-10-CM

## 2020-02-25 DIAGNOSIS — G20 Parkinson's disease: Secondary | ICD-10-CM | POA: Diagnosis not present

## 2020-02-25 MED ORDER — OLANZAPINE 5 MG PO TABS: 5.0000 mg | ORAL_TABLET | Freq: Every day | ORAL | 1 refills | Status: DC

## 2020-02-25 NOTE — Progress Notes (Signed)
Reason for visit: Parkinson's disease, gait disorder  Elijah [name] is an 80 y.o. male  History of present illness:  Mr. Ifft is a 80 year old left-handed white male with a history of Parkinson's disease.  The patient has had in the past difficulty with hallucinations and confusion, he has done much better on the Zyprexa.  The patient is on 10 mg at night, he will occasionally have brief hallucinations that are not bothersome to him.  This may occur once every 2 to 3 weeks.  He has a tendency to lean to the right.  He has tremors that are mainly affecting the left arm.  He has not had any falls, he walks with a walker.  He does have freezing spells at times.  He has a lot of difficulty getting up from a seated position if the chair is too low.  The patient is talking in his sleep, otherwise he thinks that he is sleeping well at night.  He will take an hour or 2 nap daily.  He has noted some shortness of breath when he tries to walk.  He returns to this office for an evaluation.  Past Medical History:  Diagnosis Date  . Chronic low back pain 03/24/2015  . Degenerative arthritis   . Diabetes mellitus without complication ([place])   . Dyslipidemia   . Glaucoma   . Gout 10/07/2012  . Hypertension   . Low back pain   . Obesity   . OSA (obstructive sleep apnea) 04/10/2014   cannot tolerate, PCP aware.  . Pancreatitis   . Parkinson disease ([place])   . Rotator cuff tear    Bilateral, no surgery    Past Surgical History:  Procedure Laterality Date  . AMPUTATION Right 01/31/2018   Procedure: AMPUTATION RIGHT THIRD TOE;  Surgeon: Caprice Beaver, DPM;  Location: [facility];  Service: Podiatry;  Laterality: Right;  . APPENDECTOMY    . BALLOON DILATION N/A 09/06/2015   Procedure: BALLOON DILATION;  Surgeon: Daneil Dolin, MD;  [facility];  Service: Endoscopy;  Laterality: N/A;  . BILIARY STENT PLACEMENT N/A 09/08/2015   Procedure: BILIARY STENT PLACEMENT;  Surgeon: Daneil Dolin,  MD;  [facility];  Service: Endoscopy;  Laterality: N/A;  . ERCP N/A 09/06/2015   Procedure: ENDOSCOPIC RETROGRADE CHOLANGIOPANCREATOGRAPHY (ERCP);  Surgeon: Daneil Dolin, MD;  [facility];  Service: Endoscopy;  Laterality: N/A;  . ERCP N/A 09/08/2015   Procedure: ENDOSCOPIC RETROGRADE CHOLANGIOPANCREATOGRAPHY (ERCP);  Surgeon: Daneil Dolin, MD;  [facility];  Service: Endoscopy;  Laterality: N/A;  with bile duct brushings  . HERNIA REPAIR     umbilical  . SHOULDER BONE SPUR Left    RESECTION  . SPHINCTEROTOMY N/A 09/06/2015   Procedure: SPHINCTEROTOMY;  Surgeon: Daneil Dolin, MD;  [facility];  Service: Endoscopy;  Laterality: N/A;  . TONSILLECTOMY      Family History  Problem Relation Age of Onset  . Stroke Mother   . Diabetes Mother   . Heart Problems Mother   . Parkinsonism Father   . Heart Problems Father     Social history:  reports that he has never smoked. He has never used smokeless tobacco. He reports that he does not drink alcohol and does not use drugs.    Allergies  Allergen Reactions  . Aspirin Other (See Comments)    Stomach ulcers.  . Coconut Oil     Other reaction(s): GI Upset (  intolerance)  . Penicillins Rash    Has patient had a PCN reaction causing immediate rash, facial/tongue/throat swelling, SOB or lightheadedness with hypotension: no Has patient had a PCN reaction causing severe rash involving mucus membranes or skin necrosis: No Has patient had a PCN reaction that required hospitalization No Has patient had a PCN reaction occurring within the last 10 years: No If all of the above answers are "NO", then may proceed with Cephalosporin use.     Medications:  Prior to Admission medications   Medication Sig Start Date End Date Taking? Authorizing Provider  aspirin 81 MG tablet Take 81 mg by mouth daily.   Yes [provider]  Azelastine HCl 0.15 % SOLN Place 1 spray into both nostrils daily as  needed (congestion).  08/18/15  Yes [provider]  Carbidopa-Levodopa ER (SINEMET CR) 25-100 MG tablet controlled release TAKE TWO TABLETS BY MOUTH EVERY MORNING, TWO TABLETS AT LUNCH, ONE TABLET AT dinner, AND ONE TABLET AT BEDTIME 01/19/20  Yes York Spaniel, MD  Carboxymethylcellul-Glycerin 1-0.9 % GEL Place 1 drop into both eyes daily as needed (dry eyes).   Yes [provider]  cyclobenzaprine (FLEXERIL) 10 MG tablet Take 10 mg by mouth as needed for muscle spasms.   Yes [provider]  escitalopram (LEXAPRO) 10 MG tablet Take 10 mg by mouth daily. 07/30/19  Yes [provider]  furosemide (LASIX) 20 MG tablet Take 20 mg by mouth as needed.   Yes [provider]  meloxicam (MOBIC) 15 MG tablet Take 15 mg by mouth as needed for pain.   Yes [provider]  metFORMIN (GLUCOPHAGE) 500 MG tablet Take 500 mg by mouth daily with breakfast.  10/05/12  Yes [provider]  metoprolol tartrate (LOPRESSOR) 25 MG tablet Take 12.5 mg by mouth daily.    Yes [provider]  OLANZapine (ZYPREXA) 10 MG tablet Take 10 mg by mouth at bedtime. 07/30/19  Yes [provider]  pantoprazole (PROTONIX) 40 MG tablet Take 40 mg by mouth daily. 03/08/14  Yes [provider]  potassium chloride SA (K-DUR,KLOR-CON) 20 MEQ tablet Take 20 mEq by mouth as needed.  10/24/13  Yes [provider]  pramipexole (MIRAPEX) 0.25 MG tablet Take 0.75 mg by mouth 3 (three) times daily. 06/06/19  Yes [provider]  pramipexole (MIRAPEX) 0.75 MG tablet TAKE ONE TABLET BY MOUTH THREE TIMES DAILY 01/19/20  Yes York Spaniel, MD  selegiline (ELDEPRYL) 5 MG tablet Take 5 mg by mouth 2 (two) times daily with a meal.   Yes [provider]  simvastatin (ZOCOR) 40 MG tablet Take 20 mg by mouth at bedtime.  09/05/12  Yes [provider]  valsartan-hydrochlorothiazide (DIOVAN-HCT) 80-12.5 MG per tablet Take 1 tablet by  mouth daily.   Yes [provider]    ROS:  Out of a complete 14 system review of symptoms, the patient complains only of the following symptoms, and all other reviewed systems are negative.  Walking difficulty Tremor Hallucinations  Blood pressure (!) 155/76, pulse 71, height 5\' 10"  (1.778 m), weight 220 lb (99.8 kg).  Physical Exam  General: The patient is alert and cooperative at the time of the examination.  The patient is moderately to markedly obese.  Skin: No significant peripheral edema is noted.   Neurologic Exam  Mental status: The patient is alert and oriented x 3 at the time of the examination. The patient has apparent normal recent and remote memory, with  an apparently normal attention span and concentration ability.   Cranial nerves: Facial symmetry is present. Speech is normal, no aphasia or dysarthria is noted. Extraocular movements are full. Visual fields are full.  Motor: The patient has good strength in all 4 extremities.  Sensory examination: Soft touch sensation is symmetric on the face, arms, and legs.  Coordination: The patient has good finger-nose-finger and heel-to-shin bilaterally.  The patient has a tendency to lean to the right.  He has tremors on both arms, left greater than right.  Gait and station: The patient requires assistance with standing up from the chair.  Once up, he can walk with a walker.  He is leaning to the right with walking.  Tandem gait was not attempted.  Reflexes: Deep tendon reflexes are symmetric.   Assessment/Plan:  1.  Parkinson's disease  2.  Gait disturbance  3.  Hallucinations  The patient has done much better with the hallucinations, we will try to reduce the Zyprexa dose to 5 mg at night.  In the future, we may consider switching him over to the 50/200 mg CR Sinemet tablets taking 1 tablet 3 times daily.  For now, he will stay on his current dose of Sinemet.  He will remain on the Mirapex.  He will follow  up here in 5 months.  A prescription was given for a lift chair for use at home.   Jill Alexanders MD 02/25/2020 12:16 PM  [facility] [address] [place], [place] [address]  Phone [phone] Fax [phone]

## 2020-02-25 NOTE — Patient Instructions (Signed)
We will reduce the zyprexa dose to 5 mg at night.

## 2020-03-04 ENCOUNTER — Telehealth: Payer: Self-pay | Admitting: Neurology

## 2020-03-04 NOTE — Telephone Encounter (Signed)
Called patient's wife and let her know that Dr. Jannifer Franklin is not in til Monday and her message has been forwarded to the [facility] Felecia Shelling).  She stated he has real bad days since the Xyprexa being decreased a week ago.  I assured her we would be back with her with recommendations .  She expressed appreciation.

## 2020-03-04 NOTE — Telephone Encounter (Signed)
Wife states since pt's OLANZapine (ZYPREXA) 5 MG tablet has been taken from 10mg  to 5 mg pt is having hallucinations and is clumsy in his legs.  Wife is asking for a call.

## 2020-03-05 NOTE — Telephone Encounter (Signed)
I spoke with his wife.  She reports that the behavior was much better with the higher dose.  The parkinsonian symptoms were the same without the dose.  Therefore we will go back up to 10 mg of Zyprexa

## 2020-03-20 DIAGNOSIS — I10 Essential (primary) hypertension: Secondary | ICD-10-CM | POA: Diagnosis not present

## 2020-03-20 DIAGNOSIS — G20 Parkinson's disease: Secondary | ICD-10-CM | POA: Diagnosis not present

## 2020-03-20 DIAGNOSIS — E11621 Type 2 diabetes mellitus with foot ulcer: Secondary | ICD-10-CM | POA: Diagnosis not present

## 2020-03-20 DIAGNOSIS — E119 Type 2 diabetes mellitus without complications: Secondary | ICD-10-CM | POA: Diagnosis not present

## 2020-03-20 DIAGNOSIS — M1990 Unspecified osteoarthritis, unspecified site: Secondary | ICD-10-CM | POA: Diagnosis not present

## 2020-03-20 DIAGNOSIS — K828 Other specified diseases of gallbladder: Secondary | ICD-10-CM | POA: Diagnosis not present

## 2020-04-02 DIAGNOSIS — L603 Nail dystrophy: Secondary | ICD-10-CM | POA: Diagnosis not present

## 2020-04-02 DIAGNOSIS — E1142 Type 2 diabetes mellitus with diabetic polyneuropathy: Secondary | ICD-10-CM | POA: Diagnosis not present

## 2020-04-02 DIAGNOSIS — L851 Acquired keratosis [keratoderma] palmaris et plantaris: Secondary | ICD-10-CM | POA: Diagnosis not present

## 2020-04-07 DIAGNOSIS — I10 Essential (primary) hypertension: Secondary | ICD-10-CM | POA: Diagnosis not present

## 2020-04-07 DIAGNOSIS — E1169 Type 2 diabetes mellitus with other specified complication: Secondary | ICD-10-CM | POA: Diagnosis not present

## 2020-04-07 DIAGNOSIS — M25552 Pain in left hip: Secondary | ICD-10-CM | POA: Diagnosis not present

## 2020-04-07 DIAGNOSIS — K828 Other specified diseases of gallbladder: Secondary | ICD-10-CM | POA: Diagnosis not present

## 2020-04-07 DIAGNOSIS — E785 Hyperlipidemia, unspecified: Secondary | ICD-10-CM | POA: Diagnosis not present

## 2020-04-07 DIAGNOSIS — Z0000 Encounter for general adult medical examination without abnormal findings: Secondary | ICD-10-CM | POA: Diagnosis not present

## 2020-04-07 DIAGNOSIS — Z6841 Body Mass Index (BMI) 40.0 and over, adult: Secondary | ICD-10-CM | POA: Diagnosis not present

## 2020-04-07 DIAGNOSIS — M2550 Pain in unspecified joint: Secondary | ICD-10-CM | POA: Diagnosis not present

## 2020-04-07 DIAGNOSIS — E119 Type 2 diabetes mellitus without complications: Secondary | ICD-10-CM | POA: Diagnosis not present

## 2020-04-07 DIAGNOSIS — E782 Mixed hyperlipidemia: Secondary | ICD-10-CM | POA: Diagnosis not present

## 2020-04-07 DIAGNOSIS — G20 Parkinson's disease: Secondary | ICD-10-CM | POA: Diagnosis not present

## 2020-04-09 DIAGNOSIS — K839 Disease of biliary tract, unspecified: Secondary | ICD-10-CM | POA: Diagnosis not present

## 2020-04-09 DIAGNOSIS — R443 Hallucinations, unspecified: Secondary | ICD-10-CM | POA: Diagnosis not present

## 2020-04-09 DIAGNOSIS — Z89421 Acquired absence of other right toe(s): Secondary | ICD-10-CM | POA: Diagnosis not present

## 2020-04-09 DIAGNOSIS — E1169 Type 2 diabetes mellitus with other specified complication: Secondary | ICD-10-CM | POA: Diagnosis not present

## 2020-04-09 DIAGNOSIS — E782 Mixed hyperlipidemia: Secondary | ICD-10-CM | POA: Diagnosis not present

## 2020-04-09 DIAGNOSIS — G894 Chronic pain syndrome: Secondary | ICD-10-CM | POA: Diagnosis not present

## 2020-04-09 DIAGNOSIS — G20 Parkinson's disease: Secondary | ICD-10-CM | POA: Diagnosis not present

## 2020-04-09 DIAGNOSIS — I10 Essential (primary) hypertension: Secondary | ICD-10-CM | POA: Diagnosis not present

## 2020-04-09 DIAGNOSIS — F0391 Unspecified dementia with behavioral disturbance: Secondary | ICD-10-CM | POA: Diagnosis not present

## 2020-04-19 DIAGNOSIS — I10 Essential (primary) hypertension: Secondary | ICD-10-CM | POA: Diagnosis not present

## 2020-04-19 DIAGNOSIS — E11621 Type 2 diabetes mellitus with foot ulcer: Secondary | ICD-10-CM | POA: Diagnosis not present

## 2020-04-19 DIAGNOSIS — K828 Other specified diseases of gallbladder: Secondary | ICD-10-CM | POA: Diagnosis not present

## 2020-04-19 DIAGNOSIS — E119 Type 2 diabetes mellitus without complications: Secondary | ICD-10-CM | POA: Diagnosis not present

## 2020-04-19 DIAGNOSIS — G20 Parkinson's disease: Secondary | ICD-10-CM | POA: Diagnosis not present

## 2020-04-19 DIAGNOSIS — M1990 Unspecified osteoarthritis, unspecified site: Secondary | ICD-10-CM | POA: Diagnosis not present

## 2020-05-19 DIAGNOSIS — I10 Essential (primary) hypertension: Secondary | ICD-10-CM | POA: Diagnosis not present

## 2020-05-19 DIAGNOSIS — E119 Type 2 diabetes mellitus without complications: Secondary | ICD-10-CM | POA: Diagnosis not present

## 2020-05-19 DIAGNOSIS — E11621 Type 2 diabetes mellitus with foot ulcer: Secondary | ICD-10-CM | POA: Diagnosis not present

## 2020-05-19 DIAGNOSIS — M1990 Unspecified osteoarthritis, unspecified site: Secondary | ICD-10-CM | POA: Diagnosis not present

## 2020-05-19 DIAGNOSIS — K828 Other specified diseases of gallbladder: Secondary | ICD-10-CM | POA: Diagnosis not present

## 2020-05-19 DIAGNOSIS — G20 Parkinson's disease: Secondary | ICD-10-CM | POA: Diagnosis not present

## 2020-06-11 DIAGNOSIS — E1142 Type 2 diabetes mellitus with diabetic polyneuropathy: Secondary | ICD-10-CM | POA: Diagnosis not present

## 2020-06-11 DIAGNOSIS — L603 Nail dystrophy: Secondary | ICD-10-CM | POA: Diagnosis not present

## 2020-06-20 DIAGNOSIS — I10 Essential (primary) hypertension: Secondary | ICD-10-CM | POA: Diagnosis not present

## 2020-06-20 DIAGNOSIS — M1990 Unspecified osteoarthritis, unspecified site: Secondary | ICD-10-CM | POA: Diagnosis not present

## 2020-07-19 ENCOUNTER — Other Ambulatory Visit: Payer: Self-pay | Admitting: Neurology

## 2020-07-22 DIAGNOSIS — M1990 Unspecified osteoarthritis, unspecified site: Secondary | ICD-10-CM | POA: Diagnosis not present

## 2020-07-22 DIAGNOSIS — I10 Essential (primary) hypertension: Secondary | ICD-10-CM | POA: Diagnosis not present

## 2020-07-22 DIAGNOSIS — F329 Major depressive disorder, single episode, unspecified: Secondary | ICD-10-CM | POA: Diagnosis not present

## 2020-07-22 DIAGNOSIS — F32A Depression, unspecified: Secondary | ICD-10-CM | POA: Diagnosis not present

## 2020-07-28 ENCOUNTER — Encounter: Payer: Self-pay | Admitting: Neurology

## 2020-07-28 ENCOUNTER — Ambulatory Visit: Payer: PPO | Admitting: Neurology

## 2020-07-28 VITALS — BP 148/69 | HR 68 | Ht 69.0 in | Wt 216.0 lb

## 2020-07-28 DIAGNOSIS — G20 Parkinson's disease: Secondary | ICD-10-CM

## 2020-07-28 MED ORDER — OLANZAPINE 10 MG PO TABS: 10.0000 mg | ORAL_TABLET | Freq: Every day | ORAL | Status: DC

## 2020-07-28 MED ORDER — VALSARTAN-HYDROCHLOROTHIAZIDE 160-12.5 MG PO TABS: 1.0000 | ORAL_TABLET | Freq: Every day | ORAL | Status: DC

## 2020-07-28 NOTE — Progress Notes (Signed)
Reason for visit: Parkinson's disease, gait disorder  Elijah [name] is an 80 y.o. male  History of present illness:  Elijah [name] is a 80 year old left-handed white male with a history of Parkinson's disease.  The patient in the past has had problems with hallucinations, he is now on Zyprexa taking 10 mg daily.  We tried to cut it back to 5 mg a day but the cognitive issues ensued and had to go back up on the dose.  The patient has been trying to remain active, he will cut the grass, he goes to his shop and does work during the day.  He reports no recent falls, he uses his walker for ambulation.  He has had 2 recent episodes of dizziness associated with sweats that last about 15 minutes.  The patient did not check her blood pressure during this timeframe.  He denied chest pain, chest pressure, or shortness of breath with these events.  The patient will occasionally talk in his sleep.  He remains on his Sinemet taking the 25/100 mg CR tablets, 2 in the morning and at lunch and 1 tablet at dinner and 1 tablet at bedtime.  The patient is on selegiline and he takes Mirapex 0.75 mg 3 times daily.  He returns for further evaluation.  Past Medical History:  Diagnosis Date  . Chronic low back pain 03/24/2015  . Degenerative arthritis   . Diabetes mellitus without complication ([place])   . Dyslipidemia   . Glaucoma   . Gout 10/07/2012  . Hypertension   . Low back pain   . Obesity   . OSA (obstructive sleep apnea) 04/10/2014   cannot tolerate, PCP aware.  . Pancreatitis   . Parkinson disease ([place])   . Rotator cuff tear    Bilateral, no surgery    Past Surgical History:  Procedure Laterality Date  . AMPUTATION Right 01/31/2018   Procedure: AMPUTATION RIGHT THIRD TOE;  Surgeon: Caprice Beaver, DPM;  Location: [facility];  Service: Podiatry;  Laterality: Right;  . APPENDECTOMY    . BALLOON DILATION N/A 09/06/2015   Procedure: BALLOON DILATION;  Surgeon: Daneil Dolin, MD;  Location: [facility];   Service: Endoscopy;  Laterality: N/A;  . BILIARY STENT PLACEMENT N/A 09/08/2015   Procedure: BILIARY STENT PLACEMENT;  Surgeon: Daneil Dolin, MD;  [facility];  Service: Endoscopy;  Laterality: N/A;  . ERCP N/A 09/06/2015   Procedure: ENDOSCOPIC RETROGRADE CHOLANGIOPANCREATOGRAPHY (ERCP);  Surgeon: Daneil Dolin, MD;  [facility];  Service: Endoscopy;  Laterality: N/A;  . ERCP N/A 09/08/2015   Procedure: ENDOSCOPIC RETROGRADE CHOLANGIOPANCREATOGRAPHY (ERCP);  Surgeon: Daneil Dolin, MD;  [facility];  Service: Endoscopy;  Laterality: N/A;  with bile duct brushings  . HERNIA REPAIR     umbilical  . SHOULDER BONE SPUR Left    RESECTION  . SPHINCTEROTOMY N/A 09/06/2015   Procedure: SPHINCTEROTOMY;  Surgeon: Daneil Dolin, MD;  [facility];  Service: Endoscopy;  Laterality: N/A;  . TONSILLECTOMY      Family History  Problem Relation Age of Onset  . Stroke Mother   . Diabetes Mother   . Heart Problems Mother   . Parkinsonism Father   . Heart Problems Father     Social history:  reports that he has never smoked. He has never used smokeless tobacco. He reports that he does not drink alcohol and does not use drugs.    Allergies  Allergen Reactions  .  Aspirin Other (See Comments)    Stomach ulcers.  . Coconut Oil     Other reaction(s): GI Upset (intolerance)  . Penicillins Rash    Has patient had a PCN reaction causing immediate rash, facial/tongue/throat swelling, SOB or lightheadedness with hypotension: no Has patient had a PCN reaction causing severe rash involving mucus membranes or skin necrosis: No Has patient had a PCN reaction that required hospitalization No Has patient had a PCN reaction occurring within the last 10 years: No If all of the above answers are "NO", then may proceed with Cephalosporin use.     Medications:  Prior to Admission medications   Medication Sig Start Date End Date Taking? Authorizing Provider   aspirin 81 MG tablet Take 81 mg by mouth daily.   Yes [provider]  Azelastine HCl 0.15 % SOLN Place 1 spray into both nostrils daily as needed (congestion).  08/18/15  Yes [provider]  Carbidopa-Levodopa ER (SINEMET CR) 25-100 MG tablet controlled release TAKE TWO TABLETS BY MOUTH EVERY MORNING, TWO TABLETS AT LUNCH, ONE TABLET AT dinner, AND ONE TABLET AT BEDTIME 07/20/20  Yes Kathrynn Ducking, MD  Carboxymethylcellul-Glycerin 1-0.9 % GEL Place 1 drop into both eyes daily as needed (dry eyes).   Yes [provider]  cyclobenzaprine (FLEXERIL) 10 MG tablet Take 10 mg by mouth as needed for muscle spasms.   Yes [provider]  escitalopram (LEXAPRO) 10 MG tablet Take 5 mg by mouth daily. 07/30/19  Yes [provider]  furosemide (LASIX) 20 MG tablet Take 20 mg by mouth as needed.   Yes [provider]  meloxicam (MOBIC) 15 MG tablet Take 15 mg by mouth as needed for pain.   Yes [provider]  metFORMIN (GLUCOPHAGE) 500 MG tablet Take 500 mg by mouth daily with breakfast.  10/05/12  Yes [provider]  metoprolol tartrate (LOPRESSOR) 25 MG tablet Take 12.5 mg by mouth daily.    Yes [provider]  OLANZapine (ZYPREXA) 5 MG tablet Take 1 tablet (5 mg total) by mouth at bedtime. 02/25/20  Yes Kathrynn Ducking, MD  pantoprazole (PROTONIX) 40 MG tablet Take 40 mg by mouth daily. 03/08/14  Yes [provider]  potassium chloride SA (K-DUR,KLOR-CON) 20 MEQ tablet Take 20 mEq by mouth as needed.  10/24/13  Yes [provider]  pramipexole (MIRAPEX) 0.75 MG tablet TAKE ONE TABLET BY MOUTH THREE TIMES DAILY 01/19/20  Yes Kathrynn Ducking, MD  selegiline (ELDEPRYL) 5 MG tablet Take 5 mg by mouth 2 (two) times daily with a meal.   Yes [provider]  simvastatin (ZOCOR) 40 MG tablet Take 20 mg by mouth at bedtime.  09/05/12  Yes [provider]  valsartan-hydrochlorothiazide (DIOVAN-HCT)  80-12.5 MG per tablet Take 1 tablet by mouth daily.   Yes [provider]  pramipexole (MIRAPEX) 0.25 MG tablet Take 0.75 mg by mouth 3 (three) times daily. 06/06/19   [provider]    ROS:  Out of a complete 14 system review of symptoms, the patient complains only of the following symptoms, and all other reviewed systems are negative.  Dizziness Walking difficulty tremor  Blood pressure (!) 148/69, pulse 68, height 5\' 9"  (1.753 m), weight 216 lb (98 kg).   Blood pressure, right arm, sitting is 144/78.  Blood pressure, standing, right arm is 132/78.  Physical Exam  General: The patient is alert and cooperative at the time of the examination.  The patient is moderately  obese.  Skin: 2+ edema below the knees is seen bilaterally.   Neurologic Exam  Mental status: The patient is alert and oriented x 3 at the time of the examination. The patient has apparent normal recent and remote memory, with an apparently normal attention span and concentration ability.   Cranial nerves: Facial symmetry is present. Speech is normal, no aphasia or dysarthria is noted. Extraocular movements are full. Visual fields are full.  Masking the face is seen.  Motor: The patient has good strength in all 4 extremities.  Sensory examination: Soft touch sensation is symmetric on the face, arms, and legs.  Coordination: The patient has good finger-nose-finger and heel-to-shin bilaterally.  Tremors are noted with both upper extremities, resting tremor.  Gait and station: The patient has difficulty arising from a seated position.  He uses a walker for ambulation, he has significant deviation of the upper body to the right with walking, posture is very stooped.  Reflexes: Deep tendon reflexes are symmetric.   Assessment/Plan:  1.  Parkinson's disease  2.  Gait disorder  The patient seems to be fairly stable with his ability to function at this point, I would not alter his medications.   He will follow-up here in 4 months, he is to remain as active as possible.  He will try to check a blood pressure if he has another episode of dizziness.  He does not appear to be orthostatic today.  Jill Alexanders MD 07/28/2020 11:40 AM  [facility] [address] [place], [place] [address]  Phone [phone] Fax [phone]

## 2020-08-04 DIAGNOSIS — E1142 Type 2 diabetes mellitus with diabetic polyneuropathy: Secondary | ICD-10-CM | POA: Diagnosis not present

## 2020-08-19 DIAGNOSIS — E7800 Pure hypercholesterolemia, unspecified: Secondary | ICD-10-CM | POA: Diagnosis not present

## 2020-08-19 DIAGNOSIS — I10 Essential (primary) hypertension: Secondary | ICD-10-CM | POA: Diagnosis not present

## 2020-08-20 DIAGNOSIS — E1142 Type 2 diabetes mellitus with diabetic polyneuropathy: Secondary | ICD-10-CM | POA: Diagnosis not present

## 2020-08-20 DIAGNOSIS — L603 Nail dystrophy: Secondary | ICD-10-CM | POA: Diagnosis not present

## 2020-09-02 ENCOUNTER — Telehealth: Payer: Self-pay | Admitting: Neurology

## 2020-09-02 NOTE — Telephone Encounter (Signed)
Contacted pts wife, LVM to inform her that Dr.Sater would recommend adding hydroxyzine 12.5 mg 1 or 2 daily as needed. Advised to give is a call back if she is agreeable to this as the office is now closed until Monday.

## 2020-09-02 NOTE — Telephone Encounter (Signed)
Pt's wife states a few months ago pt was having hallucinations, he is having them again and she'd like to know what Dr Jannifer Franklin would have her to do.  Please call

## 2020-09-19 DIAGNOSIS — I10 Essential (primary) hypertension: Secondary | ICD-10-CM | POA: Diagnosis not present

## 2020-09-19 DIAGNOSIS — M1990 Unspecified osteoarthritis, unspecified site: Secondary | ICD-10-CM | POA: Diagnosis not present

## 2020-09-19 DIAGNOSIS — E7800 Pure hypercholesterolemia, unspecified: Secondary | ICD-10-CM | POA: Diagnosis not present

## 2020-10-06 DIAGNOSIS — E119 Type 2 diabetes mellitus without complications: Secondary | ICD-10-CM | POA: Diagnosis not present

## 2020-10-06 DIAGNOSIS — I10 Essential (primary) hypertension: Secondary | ICD-10-CM | POA: Diagnosis not present

## 2020-10-13 DIAGNOSIS — I739 Peripheral vascular disease, unspecified: Secondary | ICD-10-CM | POA: Diagnosis not present

## 2020-10-13 DIAGNOSIS — F0391 Unspecified dementia with behavioral disturbance: Secondary | ICD-10-CM | POA: Diagnosis not present

## 2020-10-13 DIAGNOSIS — Z23 Encounter for immunization: Secondary | ICD-10-CM | POA: Diagnosis not present

## 2020-10-13 DIAGNOSIS — E1169 Type 2 diabetes mellitus with other specified complication: Secondary | ICD-10-CM | POA: Diagnosis not present

## 2020-10-13 DIAGNOSIS — E782 Mixed hyperlipidemia: Secondary | ICD-10-CM | POA: Diagnosis not present

## 2020-10-13 DIAGNOSIS — I10 Essential (primary) hypertension: Secondary | ICD-10-CM | POA: Diagnosis not present

## 2020-10-13 DIAGNOSIS — K839 Disease of biliary tract, unspecified: Secondary | ICD-10-CM | POA: Diagnosis not present

## 2020-10-13 DIAGNOSIS — Z89421 Acquired absence of other right toe(s): Secondary | ICD-10-CM | POA: Diagnosis not present

## 2020-10-13 DIAGNOSIS — G20 Parkinson's disease: Secondary | ICD-10-CM | POA: Diagnosis not present

## 2020-10-13 DIAGNOSIS — Z1211 Encounter for screening for malignant neoplasm of colon: Secondary | ICD-10-CM | POA: Diagnosis not present

## 2020-10-13 DIAGNOSIS — Z0001 Encounter for general adult medical examination with abnormal findings: Secondary | ICD-10-CM | POA: Diagnosis not present

## 2020-10-13 DIAGNOSIS — R443 Hallucinations, unspecified: Secondary | ICD-10-CM | POA: Diagnosis not present

## 2020-10-20 DIAGNOSIS — E119 Type 2 diabetes mellitus without complications: Secondary | ICD-10-CM | POA: Diagnosis not present

## 2020-10-20 DIAGNOSIS — M1990 Unspecified osteoarthritis, unspecified site: Secondary | ICD-10-CM | POA: Diagnosis not present

## 2020-10-20 DIAGNOSIS — I10 Essential (primary) hypertension: Secondary | ICD-10-CM | POA: Diagnosis not present

## 2020-10-29 DIAGNOSIS — L603 Nail dystrophy: Secondary | ICD-10-CM | POA: Diagnosis not present

## 2020-10-29 DIAGNOSIS — E1142 Type 2 diabetes mellitus with diabetic polyneuropathy: Secondary | ICD-10-CM | POA: Diagnosis not present

## 2020-11-09 DIAGNOSIS — Z1211 Encounter for screening for malignant neoplasm of colon: Secondary | ICD-10-CM | POA: Diagnosis not present

## 2020-11-15 ENCOUNTER — Ambulatory Visit: Payer: PPO | Admitting: Neurology

## 2020-11-15 ENCOUNTER — Encounter: Payer: Self-pay | Admitting: Neurology

## 2020-11-15 ENCOUNTER — Other Ambulatory Visit: Payer: Self-pay

## 2020-11-15 VITALS — BP 139/71 | HR 67 | Ht 70.0 in | Wt 213.0 lb

## 2020-11-15 DIAGNOSIS — G20 Parkinson's disease: Secondary | ICD-10-CM

## 2020-11-15 MED ORDER — CARBIDOPA-LEVODOPA ER 25-100 MG PO TBCR
EXTENDED_RELEASE_TABLET | ORAL | 1 refills | Status: DC
Start: 2020-11-15 — End: 2021-01-10

## 2020-11-15 NOTE — Progress Notes (Signed)
Reason for visit: Parkinson's disease, gait disorder  Elijah [name] is an 80 y.o. male  History of present illness:  Elijah [name] is a 80 year old left-handed white male with a history of Parkinson disease.  The patient has had some problems with hallucinations but this has gotten better since last seen after Lexapro was added back to the regimen along with the Zyprexa.  The patient walks with a walker, he has not had any falls.  He tries to exercise on a regular basis.  He denies problems with choking with swallowing.  He reports that at nighttime he will feel "nervous" with his legs, he will have to get up and walk to alleviate the symptoms and then he is able to get to bed.  He has a history of sleep apnea but he could not tolerate the CPAP.  He has a lot of daytime drowsiness, he sleeps a lot during the day.  He returns to the office today for an evaluation.  Past Medical History:  Diagnosis Date   Chronic low back pain 03/24/2015   Degenerative arthritis    Diabetes mellitus without complication ([place])    Dyslipidemia    Glaucoma    Gout 10/07/2012   Hypertension    Low back pain    Obesity    OSA (obstructive sleep apnea) 04/10/2014   cannot tolerate, PCP aware.   Pancreatitis    Parkinson disease ([place])    Rotator cuff tear    Bilateral, no surgery    Past Surgical History:  Procedure Laterality Date   AMPUTATION Right 01/31/2018   Procedure: AMPUTATION RIGHT THIRD TOE;  Surgeon: Caprice Beaver, DPM;  Location: [facility];  Service: Podiatry;  Laterality: Right;   APPENDECTOMY     BALLOON DILATION N/A 09/06/2015   Procedure: BALLOON DILATION;  Surgeon: Daneil Dolin, MD;  [facility];  Service: Endoscopy;  Laterality: N/A;   BILIARY STENT PLACEMENT N/A 09/08/2015   Procedure: BILIARY STENT PLACEMENT;  Surgeon: Daneil Dolin, MD;  [facility];  Service: Endoscopy;  Laterality: N/A;   ERCP N/A 09/06/2015   Procedure: ENDOSCOPIC RETROGRADE  CHOLANGIOPANCREATOGRAPHY (ERCP);  Surgeon: Daneil Dolin, MD;  [facility];  Service: Endoscopy;  Laterality: N/A;   ERCP N/A 09/08/2015   Procedure: ENDOSCOPIC RETROGRADE CHOLANGIOPANCREATOGRAPHY (ERCP);  Surgeon: Daneil Dolin, MD;  [facility];  Service: Endoscopy;  Laterality: N/A;  with bile duct brushings   HERNIA REPAIR     umbilical   SHOULDER BONE SPUR Left    RESECTION   SPHINCTEROTOMY N/A 09/06/2015   Procedure: SPHINCTEROTOMY;  Surgeon: Daneil Dolin, MD;  [facility];  Service: Endoscopy;  Laterality: N/A;   TONSILLECTOMY      Family History  Problem Relation Age of Onset   Stroke Mother    Diabetes Mother    Heart Problems Mother    Parkinsonism Father    Heart Problems Father     Social history:  reports that he has never smoked. He has never used smokeless tobacco. He reports that he does not drink alcohol and does not use drugs.    Allergies  Allergen Reactions   Aspirin Other (See Comments)    Stomach ulcers.   Coconut Oil     Other reaction(s): GI Upset (intolerance)   Penicillins Rash    Has patient had a PCN reaction causing immediate rash, facial/tongue/throat swelling, SOB or lightheadedness with hypotension: no Has patient had a  PCN reaction causing severe rash involving mucus membranes or skin necrosis: No Has patient had a PCN reaction that required hospitalization No Has patient had a PCN reaction occurring within the last 10 years: No If all of the above answers are "NO", then may proceed with Cephalosporin use.     Medications:  Prior to Admission medications   Medication Sig Start Date End Date Taking? Authorizing Provider  aspirin 81 MG tablet Take 81 mg by mouth daily.    [provider]  Azelastine HCl 0.15 % SOLN Place 1 spray into both nostrils daily as needed (congestion).  08/18/15   [provider]  Carbidopa-Levodopa ER (SINEMET CR) 25-100 MG tablet controlled release TAKE TWO  TABLETS BY MOUTH EVERY MORNING, TWO TABLETS AT LUNCH, ONE TABLET AT dinner, AND ONE TABLET AT BEDTIME 07/20/20   Kathrynn Ducking, MD  Carboxymethylcellul-Glycerin 1-0.9 % GEL Place 1 drop into both eyes daily as needed (dry eyes).    [provider]  cyclobenzaprine (FLEXERIL) 10 MG tablet Take 10 mg by mouth as needed for muscle spasms.    [provider]  escitalopram (LEXAPRO) 10 MG tablet Take 5 mg by mouth daily. 07/30/19   [provider]  furosemide (LASIX) 20 MG tablet Take 20 mg by mouth as needed.    [provider]  meloxicam (MOBIC) 15 MG tablet Take 15 mg by mouth as needed for pain.    [provider]  metFORMIN (GLUCOPHAGE) 500 MG tablet Take 500 mg by mouth daily with breakfast.  10/05/12   [provider]  metoprolol tartrate (LOPRESSOR) 25 MG tablet Take 12.5 mg by mouth daily.     [provider]  OLANZapine (ZYPREXA) 10 MG tablet Take 1 tablet (10 mg total) by mouth at bedtime. 07/28/20   Kathrynn Ducking, MD  pantoprazole (PROTONIX) 40 MG tablet Take 40 mg by mouth daily. 03/08/14   [provider]  potassium chloride SA (K-DUR,KLOR-CON) 20 MEQ tablet Take 20 mEq by mouth as needed.  10/24/13   [provider]  pramipexole (MIRAPEX) 0.75 MG tablet TAKE ONE TABLET BY MOUTH THREE TIMES DAILY 01/19/20   Kathrynn Ducking, MD  selegiline (ELDEPRYL) 5 MG tablet Take 5 mg by mouth 2 (two) times daily with a meal.    [provider]  simvastatin (ZOCOR) 40 MG tablet Take 20 mg by mouth at bedtime.  09/05/12   [provider]  valsartan-hydrochlorothiazide (DIOVAN HCT) 160-12.5 MG tablet Take 1 tablet by mouth daily. 07/28/20   Kathrynn Ducking, MD    ROS:  Out of a complete 14 system review of symptoms, the patient complains only of the following symptoms, and all other reviewed systems are negative.  Daytime drowsiness Restless legs Walking difficulty  Blood pressure 139/71, pulse 67,  height 5\' 10"  (1.778 m), weight 213 lb (96.6 kg).  Physical Exam  General: The patient is alert and cooperative at the time of the examination.  The patient is moderately obese.  Skin: No significant peripheral edema is noted.   Neurologic Exam  Mental status: The patient is alert and oriented x 3 at the time of the examination. The patient has apparent normal recent and remote memory, with an apparently normal attention span and concentration ability.   Cranial nerves: Facial symmetry is present. Speech is normal, no aphasia or dysarthria is noted. Extraocular movements are full. Visual fields are full.  Masking the face is seen.  Motor: The patient has good strength  in all 4 extremities.  Sensory examination: Soft touch sensation is symmetric on the face, arms, and legs.  Coordination: The patient has good finger-nose-finger and heel-to-shin bilaterally.  Resting tremors are noted with the left greater than right upper extremities.  Gait and station: The patient has some difficulty arising from a seated position, has to push off with his hands.  He walks with a walker.  Has fairly good stride.  He is kyphotic in posture, his back is rotated to the right and he leans slightly to the right.  Tandem gait was not attempted.  Reflexes: Deep tendon reflexes are symmetric.   Assessment/Plan:  1.  Parkinson's disease  2.  Gait disorder  3.  Hallucinations  The patient has had good improvement with his hallucinations.  He is reporting what sounds like restless legs at night, we will increase the nighttime Sinemet dose to 2 of the 25/100 mg tablets at night.  He will remain on Mirapex and selegiline.  He will follow-up here in 5 months, in the future he can be followed through Dr. Rexene Alberts.  Jill Alexanders MD 11/15/2020 2:34 PM  [facility] [address] [place], [place] [address]  Phone [phone] Fax [phone]

## 2020-11-19 DIAGNOSIS — E785 Hyperlipidemia, unspecified: Secondary | ICD-10-CM | POA: Diagnosis not present

## 2020-11-19 DIAGNOSIS — I10 Essential (primary) hypertension: Secondary | ICD-10-CM | POA: Diagnosis not present

## 2020-12-08 DIAGNOSIS — Z23 Encounter for immunization: Secondary | ICD-10-CM | POA: Diagnosis not present

## 2020-12-10 DIAGNOSIS — E1142 Type 2 diabetes mellitus with diabetic polyneuropathy: Secondary | ICD-10-CM | POA: Diagnosis not present

## 2020-12-30 ENCOUNTER — Telehealth: Payer: Self-pay | Admitting: Neurology

## 2020-12-30 NOTE — Telephone Encounter (Signed)
I called pt's wife back and we discussed message. She sts the pt's Hallucinatins have increased of the last 2 weeks. Reports hallucinations are mainly at bedtime and visual. Wife reports the pt will see men in the house and accuse her of being in bed with them. Reports the pt will be unruly and agitated at night.  Wife is requesting a sooner appt with the MD recommended by Dr. Jannifer Franklin to discuss these symptoms.   Per Dr. Jannifer Franklin last note ok to f/u with Dr. Rexene Alberts for future visits. Pt has been scheduled for 01/03/2021 at 915 for visit with Dr. Rexene Alberts.

## 2020-12-30 NOTE — Telephone Encounter (Signed)
Please call patient's wife regarding her concerns.  I would recommend that they see his primary care physician or at least talk to them about a referral to a psychiatrist, preferably geriatric psychiatrist.  If there is any concern for her safety or the patient's safety, she will have to call 911.  I reviewed his medication list.  He is on Zyprexa.  I typically do not recommend the use of Zyprexa in Parkinson's patients or the elderly.  It is a high risk medication and I will likely recommend that he be tapered off at his next visit, we can consider a medication called Nuplazid, and medication approved for Parkinson's related hallucinations and delusions.  I am not sure if this medication was discussed previously with Dr. Jannifer Franklin but in order to start Nuplazid he would have to come off the Zyprexa.

## 2020-12-30 NOTE — Telephone Encounter (Signed)
I called pt's wife and relayed Dr. Guadelupe Sabin message. She is agreeable to calling PCP and asking for the referral to the geriatric psychiatrist. Wife sts she will call on this today and report back on Monday with results.   We did discuss how 911 is available if concern for safety for her self or the pt is an issue. We also discussed Nuplazid and she sts Dr. Jannifer Franklin had tried the pt on this a few years ago and pt could not tolerate.

## 2020-12-30 NOTE — Telephone Encounter (Signed)
Pt's wife, Elijah [name] (on [place]) called hallucinations has been progressing for 2 weeks and have gotten worse. He's up at night thinking he sees men in the house. Would like a call from the nurse

## 2020-12-31 DIAGNOSIS — G3183 Dementia with Lewy bodies: Secondary | ICD-10-CM | POA: Diagnosis not present

## 2020-12-31 DIAGNOSIS — G20 Parkinson's disease: Secondary | ICD-10-CM | POA: Diagnosis not present

## 2020-12-31 DIAGNOSIS — F329 Major depressive disorder, single episode, unspecified: Secondary | ICD-10-CM | POA: Diagnosis not present

## 2020-12-31 DIAGNOSIS — F419 Anxiety disorder, unspecified: Secondary | ICD-10-CM | POA: Diagnosis not present

## 2021-01-03 ENCOUNTER — Ambulatory Visit: Payer: PPO | Admitting: Neurology

## 2021-01-03 ENCOUNTER — Encounter: Payer: Self-pay | Admitting: Neurology

## 2021-01-03 VITALS — BP 92/51 | HR 68 | Ht 70.0 in | Wt 207.6 lb

## 2021-01-03 DIAGNOSIS — I959 Hypotension, unspecified: Secondary | ICD-10-CM

## 2021-01-03 DIAGNOSIS — R443 Hallucinations, unspecified: Secondary | ICD-10-CM

## 2021-01-03 DIAGNOSIS — G20 Parkinson's disease: Secondary | ICD-10-CM | POA: Diagnosis not present

## 2021-01-03 DIAGNOSIS — F22 Delusional disorders: Secondary | ICD-10-CM | POA: Diagnosis not present

## 2021-01-03 DIAGNOSIS — Z9181 History of falling: Secondary | ICD-10-CM

## 2021-01-03 NOTE — Patient Instructions (Addendum)
It was nice to meet you both.  As discussed, your situation is complicated because of your advanced Parkinson's disease and high risk for complications including fall risk, low blood pressure, and hallucinations.  As I explained, I do not favor the medication Zyprexa in patients with Parkinson's disease.  For your mood irritability and hallucinations I would like to get input from a psychiatrist, preferably a geriatric psychiatrist.  As discussed, I will place a referral today.  Please continue to take your medications as prescribed for Parkinson's disease including Sinemet CR, Mirapex, and selegiline.  Please try to take your Sinemet CR/carbidopa-levodopa ER away from your mealtimes, about an hour before you eat.

## 2021-01-03 NOTE — Progress Notes (Signed)
Subjective:    Patient ID: Elijah [name] is a 80 y.o. male.  HPI    Interim history:   Elijah [name] is a 80 year old right-handed gentleman with an underlying medical history of chronic low back pain, arthritis, glaucoma, gout, hypertension, overweight state, and sleep apnea (with intolerance to positive airway pressure treatment), who presents for follow-up consultation of his parkinsonism.  The patient is accompanied by his wife today presents for follow-up.  He has been followed by Dr. Jannifer Franklin and was last seen on 11/15/2020.  I reviewed the note and copied the note below for reference.  Today, 01/03/2021: He reports very little of his own history.  His wife reports that he has had worsening hallucinations and delusions.  When she had called last week reporting worsening delusions, I had advised her to make an appointment with his primary care physician to discuss a referral to geriatric psychiatry.  He has been on Zyprexa and Lexapro.  I had explained that Zyprexa can cause side effects particularly in Parkinson's medication and is not a drug that I would favor for him.  She reports that approximately a year and a half ago he was admitted to [facility] for mood and behavioral issues.  Patient in a behavioral facility in [place] for about a week.  He was started on Zyprexa and Lexapro at the time.    They sleep in separate bedrooms and have done so for the past 15 to 20 years.  His father had Parkinson's disease and patient reports that his tremor is worse on the left side and that his symptoms started about 20 years ago.  Wife recalls that his tremor started about 10 to 15 years ago on the left side.  He has not fallen recently.  He is currently on Sinemet CR 2 pills at 8 or 9 AM, 2 pills at 12:30 or 1 PM, 2 pills around 10 PM which is bedtime and 1 pill around 5:30 PM or 6:30 PM which is dinnertime.  He takes the Sinemet CR with mealtimes typically.  He is also on Mirapex 0.75 mg 3 times a  day and selegiline 5 mg twice daily.  He tries to hydrate well with water.  He takes Zyprexa 10 mg daily and Lexapro 10 mg daily.  He uses a rolling walker at all times.  The patient's allergies, current medications, family history, past medical history, past social history, past surgical history and problem list were reviewed and updated as appropriate.   Previously (Dr. Jannifer Franklin):   11/15/20: <<Elijah [name] is a 80 year old left-handed white male with a history of Parkinson disease.  The patient has had some problems with hallucinations but this has gotten better since last seen after Lexapro was added back to the regimen along with the Zyprexa.  The patient walks with a walker, he has not had any falls.  He tries to exercise on a regular basis.  He denies problems with choking with swallowing.  He reports that at nighttime he will feel "nervous" with his legs, he will have to get up and walk to alleviate the symptoms and then he is able to get to bed.  He has a history of sleep apnea but he could not tolerate the CPAP.  He has a lot of daytime drowsiness, he sleeps a lot during the day.  He returns to the office today for an evaluation.>>  12/30/13 (Dr. Rexene Alberts): 80 year old right-handed gentleman with an underlying medical history of Parkinson's disease of over 10  years, obesity, hypertension, and diabetes and was recently referred by you for a sleep study due to a report of snoring, nocturia, and excessive daytime somnolence. He had a baseline sleep study on 12/10/2013 and went over his test results with him in detail today. His sleep efficiency was reduced at 70.2% with a latency to sleep of 13 minutes and wake after sleep onset of 125 minutes with moderate sleep fragmentation noted. He had increased percentage of stage II sleep, near absence of deep sleep and a near absence of REM sleep. His REM latency was mildly prolonged. Of note, the patient reported that he was claustrophobic and did not like the room  door closed or anything on his face or in his nose. He did tolerate the thermistor and the PTAF after the tech talked to him. He had overall mild PLMS with little arousals. He had mild to moderate and at times loud snoring. He had 242 obstructive hypopneas. AHI was 44.6 per hour. Baseline oxygen saturation was 94%, nadir was 86%. His sleep study showed evidence of severe sleep apnea but I suggested we see him back in clinic first before we try to treat him with CPAP due to his significant claustrophobia reported. Today, he reports, that he has recurrent nasal congestion and suffers from post-nasal drip. He is on allegra and mucinex multiple times a day, tried nasal steroid spray. He has trouble with nasal breathing and often by the end of the day has to breathe from his mouth. He has never seen an allergy specialist or ENT doctor. He is not sure if he could use CPAP. His Epworth sleepiness score is 15 today. He goes to bed at 10 unusual rise time is 6. He does take some naps during the day and his wife endorses that he tends to get sleepy during the day.    His Past Medical History Is Significant For: Past Medical History:  Diagnosis Date   Chronic low back pain 03/24/2015   Degenerative arthritis    Diabetes mellitus without complication ([place])    Dyslipidemia    Glaucoma    Gout 10/07/2012   Hypertension    Low back pain    Obesity    OSA (obstructive sleep apnea) 04/10/2014   cannot tolerate, PCP aware.   Pancreatitis    Parkinson disease ([place])    Rotator cuff tear    Bilateral, no surgery    His Past Surgical History Is Significant For: Past Surgical History:  Procedure Laterality Date   AMPUTATION Right 01/31/2018   Procedure: AMPUTATION RIGHT THIRD TOE;  Surgeon: Caprice Beaver, DPM;  Location: [facility];  Service: Podiatry;  Laterality: Right;   APPENDECTOMY     BALLOON DILATION N/A 09/06/2015   Procedure: BALLOON DILATION;  Surgeon: Daneil Dolin, MD;  Location: [facility];   Service: Endoscopy;  Laterality: N/A;   BILIARY STENT PLACEMENT N/A 09/08/2015   Procedure: BILIARY STENT PLACEMENT;  Surgeon: Daneil Dolin, MD;  Location: [facility];  Service: Endoscopy;  Laterality: N/A;   ERCP N/A 09/06/2015   Procedure: ENDOSCOPIC RETROGRADE CHOLANGIOPANCREATOGRAPHY (ERCP);  Surgeon: Daneil Dolin, MD;  Location: [facility];  Service: Endoscopy;  Laterality: N/A;   ERCP N/A 09/08/2015   Procedure: ENDOSCOPIC RETROGRADE CHOLANGIOPANCREATOGRAPHY (ERCP);  Surgeon: Daneil Dolin, MD;  Location: [facility];  Service: Endoscopy;  Laterality: N/A;  with bile duct brushings   HERNIA REPAIR     umbilical   SHOULDER BONE SPUR Left  RESECTION   SPHINCTEROTOMY N/A 09/06/2015   Procedure: SPHINCTEROTOMY;  Surgeon: Daneil Dolin, MD;  Location: [facility];  Service: Endoscopy;  Laterality: N/A;   TONSILLECTOMY      His Family History Is Significant For: Family History  Problem Relation Age of Onset   Stroke Mother    Diabetes Mother    Heart Problems Mother    Parkinsonism Father    Heart Problems Father     His Social History Is Significant For: Social History   Socioeconomic History   Marital status: Married    Spouse name: Vaughan Basta   Number of children: 4   Years of education: college   Highest education level: Not on file  Occupational History   Occupation: Retired  Tobacco Use   Smoking status: Never   Smokeless tobacco: Never  Vaping Use   Vaping Use: Never used  Substance and Sexual Activity   Alcohol use: No   Drug use: No   Sexual activity: Not Currently    Birth control/protection: None  Other Topics Concern   Not on file  Social History Narrative   Patient is married Vaughan Basta) and lives at home with his wife.   Patient is left-handed.   Patient drinks one soda and 1 glass of tea with caffeine   Social Determinants of Health   Financial Resource Strain: Not on file  Food Insecurity: Not on file  Transportation Needs: Not on file   Physical Activity: Not on file  Stress: Not on file  Social Connections: Not on file    His Allergies Are:  Allergies  Allergen Reactions   Aspirin Other (See Comments)    Stomach ulcers.   Coconut Oil     Other reaction(s): GI Upset (intolerance)   Penicillins Rash    Has patient had a PCN reaction causing immediate rash, facial/tongue/throat swelling, SOB or lightheadedness with hypotension: no Has patient had a PCN reaction causing severe rash involving mucus membranes or skin necrosis: No Has patient had a PCN reaction that required hospitalization No Has patient had a PCN reaction occurring within the last 10 years: No If all of the above answers are "NO", then may proceed with Cephalosporin use.   :   His Current Medications Are:  Outpatient Encounter Medications as of 01/03/2021  Medication Sig   aspirin 81 MG tablet Take 81 mg by mouth daily.   Azelastine HCl 0.15 % SOLN Place 1 spray into both nostrils daily as needed (congestion).    Carbidopa-Levodopa ER (SINEMET CR) 25-100 MG tablet controlled release Two tablets in the morning and at noon and at bedtime, one tablet at dinner   Carboxymethylcellul-Glycerin 1-0.9 % GEL Place 1 drop into both eyes daily as needed (dry eyes).   cyclobenzaprine (FLEXERIL) 10 MG tablet Take 10 mg by mouth as needed for muscle spasms.   escitalopram (LEXAPRO) 10 MG tablet Take 5 mg by mouth daily.   furosemide (LASIX) 20 MG tablet Take 20 mg by mouth as needed.   meloxicam (MOBIC) 15 MG tablet Take 15 mg by mouth as needed for pain.   metFORMIN (GLUCOPHAGE) 500 MG tablet Take 500 mg by mouth daily with breakfast.    metoprolol tartrate (LOPRESSOR) 25 MG tablet Take 12.5 mg by mouth daily.    OLANZapine (ZYPREXA) 10 MG tablet Take 1 tablet (10 mg total) by mouth at bedtime.   pantoprazole (PROTONIX) 40 MG tablet Take 40 mg by mouth daily.   potassium chloride SA (K-DUR,KLOR-CON) 20 MEQ tablet  Take 20 mEq by mouth as needed.    pramipexole  (MIRAPEX) 0.75 MG tablet TAKE ONE TABLET BY MOUTH THREE TIMES DAILY   selegiline (ELDEPRYL) 5 MG tablet Take 5 mg by mouth 2 (two) times daily with a meal.   simvastatin (ZOCOR) 40 MG tablet Take 20 mg by mouth at bedtime.    valsartan-hydrochlorothiazide (DIOVAN HCT) 160-12.5 MG tablet Take 1 tablet by mouth daily.   No facility-administered encounter medications on file as of 01/03/2021.  :  Review of Systems:  Out of a complete 14 point review of systems, all are reviewed and negative with the exception of these symptoms as listed below:  Review of Systems  Neurological:        Pt is here for parkinson's. Pt states he is having hallucinations, tremors in both hands left is  the worse. Pt wife states that he is having increase confusion.Wife states he is not on new medication . Marland Kitchen      Objective:  Neurological Exam  Physical Exam Physical Examination:   Vitals:   01/03/21 0920  BP: (!) 92/51  Pulse: 68    General Examination: The patient is a very pleasant 80 y.o. male in no acute distress. He appears frail, well-groomed.   HEENT: Normocephalic, atraumatic, pupils are equal, round and reactive to light, extraocular tracking is impaired in all directions, severe nuchal rigidity noted in anterior flexion of the neck is significant.  Face is symmetric with moderate facial masking.  Hearing is mildly impaired.  Speech is mildly hypophonic, slightly dysarthric at times, scant.  Airway examination reveals mild mouth dryness.  Tongue protrudes centrally.  Decreased range of motion actively and passively in the neck.  Chest: Clear to auscultation without wheezing, rhonchi or crackles noted.  Heart: S1+S2+0, regular and normal without murmurs, rubs or gallops noted.   Abdomen: Soft, non-tender and non-distended with normal bowel sounds appreciated on auscultation.  Extremities: There is 1+ pitting edema in the distal lower extremities bilaterally.   Skin: Warm and dry without trophic  changes noted.  Musculoskeletal: exam reveals no obvious joint deformities, but significantly stooped posture noted.    Neurologically:  Mental status: The patient is awake, alert and pays good attention, he is not providing details of his history and details are provided by his wife. No active hallucinations or delusions at this time. Cranial nerves II - XII are as described above under HEENT exam.  Motor exam: Normal bulk, global strength of about 4 out of 5, increased tone throughout, left-sided cogwheeling noted.  He has a mild to moderate intermittent resting tremor in the left upper extremity, no obvious resting tremor otherwise.  Fine motor skills are moderate to severely impaired on the left side and moderately impaired on the right, moderate bradykinesia noted, no obvious dyskinesias.  Romberg is not testable safely.  He stands up with difficulty and requires assistance.  Posture is moderate to severely stooped.   Walker with seat but significant slowness and shuffling is noted, mild freezing episodes, difficulty turning, requiring multiple steps.  Cerebellar testing is limited, difficulty with fine motor control noted, no obvious dysmetria.  Heel-to-shin is not possible. Sensory exam: intact to light touch in the upper and lower extremities.   Assessment and Plan:   In summary, Elijah [name] is a very pleasant 80 y.o.-year old male with an underlying medical history of chronic low back pain, arthritis, glaucoma, gout, hypertension, overweight state, and sleep apnea (with intolerance to positive airway pressure treatment), who  presents for follow-up consultation of his parkinsonism.  He was previously followed by Dr. Jannifer Franklin.  He is currently on Mirapex 0.75 mg 3 times daily, selegiline 5 mg twice daily, Sinemet CR 2 tablets 3 times a day and 1 tablet at dinnertime.  He is also on Lexapro 10 mg strength of Zyprexa 10 mg strength for behavioral changes including hallucinations, delusions and  anxiety and depression.  He is currently not followed by behavioral health but had a behavioral health admission a couple years ago.  I explained to the patient and his wife that I do not typically recommend Zyprexa in the management of Parkinson's patients.  I would favor the input and management with a specialist, particularly a geriatric psychiatrist.  He has had some low blood pressure.  He has intermittent constipation, his situation is complicated as he is advanced with his Parkinson's findings as well.  We talked about the challenges of advancing Parkinson's disease.  We talked about fall prevention and constipation control.  He is advised to stay well-hydrated and well rested and use his walker at all times.  We will continue with the Parkinson's medication.  He is agreeable to a referral to psychiatry.  I placed the referral today.  He is advised to follow-up to see the nurse practitioner in 3 to 4 months.  I answered all the questions today and the patient and his wife are in agreement. I spent 40 minutes in total face-to-face time and in reviewing records during pre-charting, more than 50% of which was spent in counseling and coordination of care, reviewing test results, reviewing medications and treatment regimen and/or in discussing or reviewing the diagnosis of PD, the prognosis and treatment options. Pertinent laboratory and imaging test results that were available during this visit with the patient were reviewed by me and considered in my medical decision making (see chart for details).

## 2021-01-07 ENCOUNTER — Other Ambulatory Visit: Payer: Self-pay | Admitting: Neurology

## 2021-01-07 ENCOUNTER — Telehealth: Payer: Self-pay | Admitting: Neurology

## 2021-01-07 DIAGNOSIS — E1142 Type 2 diabetes mellitus with diabetic polyneuropathy: Secondary | ICD-10-CM | POA: Diagnosis not present

## 2021-01-07 DIAGNOSIS — L603 Nail dystrophy: Secondary | ICD-10-CM | POA: Diagnosis not present

## 2021-01-07 NOTE — Telephone Encounter (Signed)
Pt's wife, Romen Yutzy (on [facility]) [place] in [place] do not have psychiatrist there; only do counseling. Would like a call back to discuss the next step.

## 2021-01-12 NOTE — Telephone Encounter (Signed)
Referral has been sent to [facility] in [place]. Phone: [phone].

## 2021-01-19 DIAGNOSIS — I10 Essential (primary) hypertension: Secondary | ICD-10-CM | POA: Diagnosis not present

## 2021-01-19 DIAGNOSIS — E782 Mixed hyperlipidemia: Secondary | ICD-10-CM | POA: Diagnosis not present

## 2021-01-25 NOTE — Telephone Encounter (Signed)
Pt's wife Vaughan Basta called states the doctor at  [facility] is not accepting new pt's, husband will have to see the NP there. Vaughan Basta is wanting to know is that okay or would you rather him see a Tax adviser. Vaughan Basta requesting a call back.

## 2021-01-26 NOTE — Telephone Encounter (Signed)
Okay to see nurse practitioner provider as well at [facility] psychiatry.

## 2021-01-26 NOTE — Telephone Encounter (Signed)
Spoke with patient's wife, Vaughan Basta, and advised that Dr Rexene Alberts is ok with the pt seeing an NP at [facility]. Vaughan Basta verbalized understanding and appreciation for the call.

## 2021-01-28 DIAGNOSIS — E1151 Type 2 diabetes mellitus with diabetic peripheral angiopathy without gangrene: Secondary | ICD-10-CM | POA: Diagnosis not present

## 2021-01-28 DIAGNOSIS — Z89421 Acquired absence of other right toe(s): Secondary | ICD-10-CM | POA: Diagnosis not present

## 2021-01-28 DIAGNOSIS — E1165 Type 2 diabetes mellitus with hyperglycemia: Secondary | ICD-10-CM | POA: Diagnosis not present

## 2021-01-28 DIAGNOSIS — E1169 Type 2 diabetes mellitus with other specified complication: Secondary | ICD-10-CM | POA: Diagnosis not present

## 2021-01-28 DIAGNOSIS — D8481 Immunodeficiency due to conditions classified elsewhere: Secondary | ICD-10-CM | POA: Diagnosis not present

## 2021-01-28 DIAGNOSIS — I739 Peripheral vascular disease, unspecified: Secondary | ICD-10-CM | POA: Diagnosis not present

## 2021-01-28 DIAGNOSIS — E1142 Type 2 diabetes mellitus with diabetic polyneuropathy: Secondary | ICD-10-CM | POA: Diagnosis not present

## 2021-01-28 DIAGNOSIS — G20 Parkinson's disease: Secondary | ICD-10-CM | POA: Diagnosis not present

## 2021-01-28 DIAGNOSIS — F0390 Unspecified dementia without behavioral disturbance: Secondary | ICD-10-CM | POA: Diagnosis not present

## 2021-01-28 DIAGNOSIS — E663 Overweight: Secondary | ICD-10-CM | POA: Diagnosis not present

## 2021-01-28 DIAGNOSIS — F3342 Major depressive disorder, recurrent, in full remission: Secondary | ICD-10-CM | POA: Diagnosis not present

## 2021-01-28 DIAGNOSIS — F209 Schizophrenia, unspecified: Secondary | ICD-10-CM | POA: Diagnosis not present

## 2021-02-02 DIAGNOSIS — G20 Parkinson's disease: Secondary | ICD-10-CM | POA: Diagnosis not present

## 2021-02-07 DIAGNOSIS — Z515 Encounter for palliative care: Secondary | ICD-10-CM | POA: Diagnosis not present

## 2021-02-07 DIAGNOSIS — G20 Parkinson's disease: Secondary | ICD-10-CM | POA: Diagnosis not present

## 2021-02-18 DIAGNOSIS — I10 Essential (primary) hypertension: Secondary | ICD-10-CM | POA: Diagnosis not present

## 2021-02-18 DIAGNOSIS — E782 Mixed hyperlipidemia: Secondary | ICD-10-CM | POA: Diagnosis not present

## 2021-02-24 DIAGNOSIS — G20 Parkinson's disease: Secondary | ICD-10-CM | POA: Diagnosis not present

## 2021-03-10 DIAGNOSIS — E1142 Type 2 diabetes mellitus with diabetic polyneuropathy: Secondary | ICD-10-CM | POA: Diagnosis not present

## 2021-03-10 DIAGNOSIS — L603 Nail dystrophy: Secondary | ICD-10-CM | POA: Diagnosis not present

## 2021-03-19 ENCOUNTER — Emergency Department (HOSPITAL_COMMUNITY): Payer: PPO

## 2021-03-19 ENCOUNTER — Emergency Department (HOSPITAL_COMMUNITY)
Admission: EM | Admit: 2021-03-19 | Discharge: 2021-03-19 | Disposition: A | Discharge status: Home / Self Care | Payer: PPO | Attending: Emergency Medicine | Admitting: Emergency Medicine

## 2021-03-19 ENCOUNTER — Other Ambulatory Visit: Payer: Self-pay

## 2021-03-19 ENCOUNTER — Encounter (HOSPITAL_COMMUNITY): Payer: Self-pay

## 2021-03-19 DIAGNOSIS — R42 Dizziness and giddiness: Secondary | ICD-10-CM | POA: Insufficient documentation

## 2021-03-19 DIAGNOSIS — W01198A Fall on same level from slipping, tripping and stumbling with subsequent striking against other object, initial encounter: Secondary | ICD-10-CM | POA: Insufficient documentation

## 2021-03-19 DIAGNOSIS — W19XXXA Unspecified fall, initial encounter: Secondary | ICD-10-CM | POA: Diagnosis not present

## 2021-03-19 DIAGNOSIS — I10 Essential (primary) hypertension: Secondary | ICD-10-CM | POA: Insufficient documentation

## 2021-03-19 DIAGNOSIS — E119 Type 2 diabetes mellitus without complications: Secondary | ICD-10-CM | POA: Insufficient documentation

## 2021-03-19 DIAGNOSIS — Z7982 Long term (current) use of aspirin: Secondary | ICD-10-CM | POA: Diagnosis not present

## 2021-03-19 DIAGNOSIS — G20 Parkinson's disease: Secondary | ICD-10-CM | POA: Diagnosis not present

## 2021-03-19 DIAGNOSIS — R519 Headache, unspecified: Secondary | ICD-10-CM | POA: Insufficient documentation

## 2021-03-19 DIAGNOSIS — F0280 Dementia in other diseases classified elsewhere without behavioral disturbance: Secondary | ICD-10-CM | POA: Diagnosis not present

## 2021-03-19 DIAGNOSIS — S199XXA Unspecified injury of neck, initial encounter: Secondary | ICD-10-CM | POA: Diagnosis not present

## 2021-03-19 DIAGNOSIS — Y92512 Supermarket, store or market as the place of occurrence of the external cause: Secondary | ICD-10-CM | POA: Insufficient documentation

## 2021-03-19 DIAGNOSIS — Z79899 Other long term (current) drug therapy: Secondary | ICD-10-CM | POA: Diagnosis not present

## 2021-03-19 DIAGNOSIS — S0990XA Unspecified injury of head, initial encounter: Secondary | ICD-10-CM | POA: Diagnosis not present

## 2021-03-19 DIAGNOSIS — Z7984 Long term (current) use of oral hypoglycemic drugs: Secondary | ICD-10-CM | POA: Diagnosis not present

## 2021-03-19 NOTE — Discharge Instructions (Signed)
You have been seen in the ED after a fall. Your CT head and cervical spine returned normal. You should follow up with neurologist for worsening dementia symptoms over the past several months. Please return to the ED if you develop any further concerning symptoms.

## 2021-03-19 NOTE — ED Triage Notes (Signed)
Pt brought in by ems after fall while at [employer] and hitting front of head.  Pt is on aspirin daily.  Denies loc.  Reports dizziness when standing.  Resp even and unlabored.  VSS by ems.  No meds pta.

## 2021-03-19 NOTE — ED Provider Notes (Signed)
[facility] EMERGENCY DEPARTMENT Provider Note   CSN: [id number] Arrival date & time: 03/19/21  1653     History  Chief Complaint  Patient presents with   Elijah [name]    EMERSEN CARROLL is a 81 y.o. male.  Past medical history of Parkinson's disease, dementia diabetes, obesity, dyslipidemia, hypertension, and OSA.  Patient presents after a fall while tripping over a raised area at the grocery store.  At the he landed on his left side and also hit the left side of his head.  He did not have any loss of consciousness.  He is on aspirin but no other blood thinners.  According to wife, patient was complaining of headache and dizziness earlier today.  History is difficult to obtain from patient as he has a diagnosis of Parkinson's and dementia.  Patient has no other complaints at this time.   Fall Associated symptoms include headaches.      Home Medications Prior to Admission medications   Medication Sig Start Date End Date Taking? Authorizing Provider  aspirin 81 MG tablet Take 81 mg by mouth daily.    [provider]  Azelastine HCl 0.15 % SOLN Place 1 spray into both nostrils daily as needed (congestion).  08/18/15   [provider]  Carbidopa-Levodopa ER (SINEMET CR) 25-100 MG tablet controlled release TAKE TWO TABLETS BY MOUTH EVERY MORNING, TWO TABLETS AT LUNCH, ONE TABLET AT dinner, AND ONE TABLET AT BEDTIME 01/10/21   Star Age, MD  Carboxymethylcellul-Glycerin 1-0.9 % GEL Place 1 drop into both eyes daily as needed (dry eyes).    [provider]  cyclobenzaprine (FLEXERIL) 10 MG tablet Take 10 mg by mouth as needed for muscle spasms.    [provider]  escitalopram (LEXAPRO) 10 MG tablet Take 5 mg by mouth daily. 07/30/19   [provider]  furosemide (LASIX) 20 MG tablet Take 20 mg by mouth as needed.    [provider]  meloxicam (MOBIC) 15 MG tablet Take 15 mg by mouth as needed for pain.    [provider]  metFORMIN  (GLUCOPHAGE) 500 MG tablet Take 500 mg by mouth daily with breakfast.  10/05/12   [provider]  metoprolol tartrate (LOPRESSOR) 25 MG tablet Take 12.5 mg by mouth daily.     [provider]  OLANZapine (ZYPREXA) 10 MG tablet Take 1 tablet (10 mg total) by mouth at bedtime. 07/28/20   Kathrynn Ducking, MD  pantoprazole (PROTONIX) 40 MG tablet Take 40 mg by mouth daily. 03/08/14   [provider]  potassium chloride SA (K-DUR,KLOR-CON) 20 MEQ tablet Take 20 mEq by mouth as needed.  10/24/13   [provider]  pramipexole (MIRAPEX) 0.75 MG tablet TAKE ONE TABLET BY MOUTH THREE TIMES DAILY 01/19/20   Kathrynn Ducking, MD  selegiline (ELDEPRYL) 5 MG tablet Take 5 mg by mouth 2 (two) times daily with a meal.    [provider]  simvastatin (ZOCOR) 40 MG tablet Take 20 mg by mouth at bedtime.  09/05/12   [provider]  valsartan-hydrochlorothiazide (DIOVAN HCT) 160-12.5 MG tablet Take 1 tablet by mouth daily. 07/28/20   Kathrynn Ducking, MD      Allergies    Aspirin, Coconut oil, and Penicillins    Review of Systems   Review of Systems  Neurological:  Positive for dizziness and headaches.  All other systems reviewed and are negative.  Physical Exam Updated Vital Signs BP 118/68 (BP Location: Left Arm)  Pulse (!) 59    Temp 97.8 F (36.6 C)    Resp 17    SpO2 94%  Physical Exam Vitals and nursing note reviewed.  Constitutional:      General: He is not in acute distress.    Appearance: Normal appearance. He is well-developed. He is not ill-appearing, toxic-appearing or diaphoretic.  HENT:     Head: Normocephalic and atraumatic.     Right Ear: Tympanic membrane, ear canal and external ear normal. There is no impacted cerumen.     Left Ear: Tympanic membrane, ear canal and external ear normal. There is no impacted cerumen.     Ears:     Comments: No hemotympanum    Nose: Nose normal. No nasal deformity.     Mouth/Throat:     Lips: Pink.  No lesions.     Mouth: Mucous membranes are moist.     Pharynx: Oropharynx is clear. No oropharyngeal exudate or posterior oropharyngeal erythema.  Eyes:     General: Gaze aligned appropriately. No scleral icterus.       Right eye: No discharge.        Left eye: No discharge.     Extraocular Movements: Extraocular movements intact.     Conjunctiva/sclera: Conjunctivae normal.     Right eye: Right conjunctiva is not injected. No exudate or hemorrhage.    Left eye: Left conjunctiva is not injected. No exudate or hemorrhage.    Pupils: Pupils are equal, round, and reactive to light.  Neck:     Comments: C spine midline ttp, no stepoffs Cardiovascular:     Rate and Rhythm: Normal rate and regular rhythm.     Pulses: Normal pulses.     Heart sounds: Normal heart sounds. No murmur heard.   No friction rub. No gallop.  Pulmonary:     Effort: Pulmonary effort is normal. No respiratory distress.     Breath sounds: Normal breath sounds. No stridor. No wheezing, rhonchi or rales.  Chest:     Chest wall: No tenderness.  Abdominal:     General: Abdomen is flat. There is no distension.     Palpations: Abdomen is soft.     Tenderness: There is no abdominal tenderness. There is no guarding or rebound.  Musculoskeletal:     Cervical back: Normal range of motion. Tenderness present.  Skin:    General: Skin is warm and dry.  Neurological:     General: No focal deficit present.     Mental Status: He is alert. Mental status is at baseline.     Comments: Alert and Oriented x 3 Speech difficult to understand but this is baseline per wife Cranial Nerve testing - PERRLA. EOM intact. No Nystagmus - Facial Sensation grossly intact - No facial asymmetry Motor: - 5/5 motor strength in all four extremities.  - No pronator Drift - Normal tone Sensation: - Grossly intact in all four extremities.    Psychiatric:        Mood and Affect: Mood normal.        Speech: Speech normal.        Behavior:  Behavior normal. Behavior is cooperative.    ED Results / Procedures / Treatments   Labs (all labs ordered are listed, but only abnormal results are displayed) Labs Reviewed - No data to display  EKG None  Radiology CT Head Wo Contrast  Result Date: 03/19/2021 CLINICAL DATA:  Head trauma, minor (Age >= 65y); Neck trauma (Age >= 65y). Dizziness. Status post  fall. EXAM: CT HEAD WITHOUT CONTRAST CT CERVICAL SPINE WITHOUT CONTRAST TECHNIQUE: Multidetector CT imaging of the head and cervical spine was performed following the standard protocol without intravenous contrast. Multiplanar CT image reconstructions of the cervical spine were also generated. RADIATION DOSE REDUCTION: This exam was performed according to the departmental dose-optimization program which includes automated exposure control, adjustment of the mA and/or kV according to patient size and/or use of iterative reconstruction technique. COMPARISON:  None. FINDINGS: CT HEAD FINDINGS Brain: No evidence of large-territorial acute infarction. No parenchymal hemorrhage. No mass lesion. No extra-axial collection. No mass effect or midline shift. No hydrocephalus. Basilar cisterns are patent. Vascular: No hyperdense vessel. Atherosclerotic calcifications are present within the cavernous internal carotid arteries. Skull: No acute fracture or focal lesion. Sinuses/Orbits: Paranasal sinuses and mastoid air cells are clear. The orbits are unremarkable. Other: None. CT CERVICAL SPINE FINDINGS Alignment: Grade 1 anterolisthesis C2 on C3, C3 on C4, C4 on C5, C7 on T1 likely due to degenerative changes. Skull base and vertebrae: Multilevel severe degenerative changes of the spine with associated severe multilevel osseous neural foraminal stenosis. No severe osseous central canal stenosis. Likely old healed posterior right first rib fracture. No acute fracture. No aggressive appearing focal osseous lesion or focal pathologic process. Soft tissues and spinal  canal: No prevertebral fluid or swelling. No visible canal hematoma. Upper chest: Unremarkable. Other: Atherosclerotic plaque of the aorta. Subcentimeter hypodensity within the right thyroid gland. Subcentimeter hypodensity with left thyroid gland. Not clinically significant; no follow-up imaging recommended (ref: J Am Coll Radiol. 2015 Feb;12(2): 143-50). IMPRESSION: 1.  No acute intracranial abnormality. 2. No acute displaced fracture or traumatic listhesis of the cervical spine. 3. Multilevel severe degenerative changes of the spine with associated severe multilevel osseous neural foraminal stenosis. 4.  Aortic Atherosclerosis (ICD10-I70.0). Electronically Signed   By: Iven Finn M.D.   On: 03/19/2021 18:48   CT Cervical Spine Wo Contrast  Result Date: 03/19/2021 CLINICAL DATA:  Head trauma, minor (Age >= 65y); Neck trauma (Age >= 65y). Dizziness. Status post fall. EXAM: CT HEAD WITHOUT CONTRAST CT CERVICAL SPINE WITHOUT CONTRAST TECHNIQUE: Multidetector CT imaging of the head and cervical spine was performed following the standard protocol without intravenous contrast. Multiplanar CT image reconstructions of the cervical spine were also generated. RADIATION DOSE REDUCTION: This exam was performed according to the departmental dose-optimization program which includes automated exposure control, adjustment of the mA and/or kV according to patient size and/or use of iterative reconstruction technique. COMPARISON:  None. FINDINGS: CT HEAD FINDINGS Brain: No evidence of large-territorial acute infarction. No parenchymal hemorrhage. No mass lesion. No extra-axial collection. No mass effect or midline shift. No hydrocephalus. Basilar cisterns are patent. Vascular: No hyperdense vessel. Atherosclerotic calcifications are present within the cavernous internal carotid arteries. Skull: No acute fracture or focal lesion. Sinuses/Orbits: Paranasal sinuses and mastoid air cells are clear. The orbits are unremarkable.  Other: None. CT CERVICAL SPINE FINDINGS Alignment: Grade 1 anterolisthesis C2 on C3, C3 on C4, C4 on C5, C7 on T1 likely due to degenerative changes. Skull base and vertebrae: Multilevel severe degenerative changes of the spine with associated severe multilevel osseous neural foraminal stenosis. No severe osseous central canal stenosis. Likely old healed posterior right first rib fracture. No acute fracture. No aggressive appearing focal osseous lesion or focal pathologic process. Soft tissues and spinal canal: No prevertebral fluid or swelling. No visible canal hematoma. Upper chest: Unremarkable. Other: Atherosclerotic plaque of the aorta. Subcentimeter hypodensity within the right thyroid gland. Subcentimeter  hypodensity with left thyroid gland. Not clinically significant; no follow-up imaging recommended (ref: J Am Coll Radiol. 2015 Feb;12(2): 143-50). IMPRESSION: 1.  No acute intracranial abnormality. 2. No acute displaced fracture or traumatic listhesis of the cervical spine. 3. Multilevel severe degenerative changes of the spine with associated severe multilevel osseous neural foraminal stenosis. 4.  Aortic Atherosclerosis (ICD10-I70.0). Electronically Signed   By: Iven Finn M.D.   On: 03/19/2021 18:48    Procedures Procedures   Medications Ordered in ED Medications - No data to display  ED Course/ Medical Decision Making/ A&P                           Medical Decision Making Problems Addressed: Fall, initial encounter: acute illness or injury  Amount and/or Complexity of Data Reviewed Independent Historian: spouse Radiology: ordered and independent interpretation performed. Decision-making details documented in ED Course.  Risk Diagnosis or treatment significantly limited by social determinants of health. Risk Details: Patient with baseline dementia and Parkinson's disease.  History and physical are difficult to obtain.   This is a 81 y.o. male with a PMH of Parkinson's disease,  dementia diabetes, obesity, dyslipidemia, hypertension, and OSA who presents to the ED with mechanical fall and hitting head.  Mentation is at baseline per wife, but history and exam were difficult to obtain due to patient's dementia and parkinson's. Will go ahead and obtain CT head and c spine to evaluate for traumatic intracranial bleed  I personally reviewed all laboratory work and imaging. Abnormal results outlined below. CT imaging is without acute abnormality.  Impression: I discussed findings with patient and his wife.  Patient is at his baseline functional status.  Fall seem to be mechanical and do not feel patient needs further work-up at this time.  Portions of this note were generated with [employer]. Dictation errors may occur despite best attempts at proofreading.   Final Clinical Impression(s) / ED Diagnoses Final diagnoses:  Fall, initial encounter    Rx / DC Orders ED Discharge Orders     None         Adolphus Birchwood, PA-C 03/19/21 2340    Fredia Sorrow, MD 03/20/21 [phone]

## 2021-03-23 DIAGNOSIS — Z9181 History of falling: Secondary | ICD-10-CM | POA: Diagnosis not present

## 2021-03-23 DIAGNOSIS — I10 Essential (primary) hypertension: Secondary | ICD-10-CM | POA: Diagnosis not present

## 2021-03-23 DIAGNOSIS — G20 Parkinson's disease: Secondary | ICD-10-CM | POA: Diagnosis not present

## 2021-03-27 DIAGNOSIS — G20 Parkinson's disease: Secondary | ICD-10-CM | POA: Diagnosis not present

## 2021-04-06 ENCOUNTER — Ambulatory Visit: Payer: PPO | Admitting: Neurology

## 2021-04-07 ENCOUNTER — Encounter: Payer: Self-pay | Admitting: Behavioral Health

## 2021-04-07 ENCOUNTER — Ambulatory Visit: Payer: PPO | Admitting: Behavioral Health

## 2021-04-07 ENCOUNTER — Other Ambulatory Visit: Payer: Self-pay

## 2021-04-07 VITALS — BP 129/64 | HR 71 | Ht 70.0 in | Wt 196.0 lb

## 2021-04-07 DIAGNOSIS — G20 Parkinson's disease: Secondary | ICD-10-CM | POA: Diagnosis not present

## 2021-04-07 DIAGNOSIS — F331 Major depressive disorder, recurrent, moderate: Secondary | ICD-10-CM

## 2021-04-07 DIAGNOSIS — F411 Generalized anxiety disorder: Secondary | ICD-10-CM

## 2021-04-07 DIAGNOSIS — R4181 Age-related cognitive decline: Secondary | ICD-10-CM

## 2021-04-07 DIAGNOSIS — F22 Delusional disorders: Secondary | ICD-10-CM

## 2021-04-07 MED ORDER — QUETIAPINE FUMARATE 25 MG PO TABS: 25.0000 mg | ORAL_TABLET | Freq: Every day | ORAL | 1 refills | Status: DC

## 2021-04-07 NOTE — Progress Notes (Signed)
[facility] MD/PA/NP Initial Note  04/07/2021 4:25 PM Elijah [name]  MRN:  [id number]  Chief Complaint:  Chief Complaint   Altered Mental Status; Medication Problem; Establish Care; Medication Refill     HPI:   81 year old male presents to this office for initial visit and to establish care. He is pleasant and polite. Sometime unable to describe his current status effectively. His wife Vaughan Basta is present during interview to assist. He says that he sometimes has problems with hallucinations. He says,"I sometimes see people that are dressed like me and you but they wonder around my house and can pass through walls like there are no doors. I have seen them go back to the bedroom where my wife is, and I worry they are going to have relations with her".  Vaughan Basta says that this became worse after a trial of Nuplazid two years ago. She said hallucinations were more pronounced and they stopped the medication.  She says that during that time he was experiencing bizarre behaviors and pulled two knifes out and was rubbing them together at the doorway to the bedroom like sharpening them, ready to carve meat. She said this alarmed her. He decided to check himself in to ER and then later spent week at [facility]. Vaughan Basta says that recently his behaviors have improved since being prescribed Zyprexa 10 mg at bedtime, but still has the hallucinations and bizarre behaviors. She says that they are more pronounced in the evening when the sun starts to go down.  She understands that due to his advanced age and Parkinson's Disease that medications are limited. Pt does follow up with Neurology on regular basis. Next follow up is on 3/16. Says his anxiety today is 4/10 and depression is 5/10. He does sleep 7-8 hours per night with the aid of Ativan. He endorses regular visual hallucinations. Denies  command auditory hallucinations. He endorses racing thoughts, trouble concentrating, irritability, increased interest in  sex, and foolish behaviors on MDQ. Denies mania, no current SI or HI.   Past psychiatric medication trials: To provide more complete list next visit. 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 Generalized anxiety disorder  F41.1     3. Major depressive disorder, recurrent episode, moderate (HCC)  F33.1     4. Age-related cognitive decline  R41.81       Past Psychiatric History: see chart  Past Medical History:  Past Medical History:  Diagnosis Date   Chronic low back pain 03/24/2015   Degenerative arthritis    Diabetes mellitus without complication ([place])    Dyslipidemia    Glaucoma    Gout 10/07/2012   Hypertension    Low back pain    Obesity    OSA (obstructive sleep apnea) 04/10/2014   cannot tolerate, PCP aware.   Pancreatitis    Parkinson disease ([place])    Rotator cuff tear    Bilateral, no surgery    Past Surgical History:  Procedure Laterality Date   AMPUTATION Right 01/31/2018   Procedure: AMPUTATION RIGHT THIRD TOE;  Surgeon: Caprice Beaver, DPM;  Location: [facility];  Service: Podiatry;  Laterality: Right;   APPENDECTOMY     BALLOON DILATION N/A 09/06/2015   Procedure: BALLOON DILATION;  Surgeon: Daneil Dolin, MD;  Location: [facility];  Service: Endoscopy;  Laterality: N/A;   BILIARY STENT PLACEMENT N/A 09/08/2015  Procedure: BILIARY STENT PLACEMENT;  Surgeon: Daneil Dolin, MD;  Location: [facility];  Service: Endoscopy;  Laterality: N/A;   ERCP N/A 09/06/2015   Procedure: ENDOSCOPIC RETROGRADE CHOLANGIOPANCREATOGRAPHY (ERCP);  Surgeon: Daneil Dolin, MD;  Location: [facility];  Service: Endoscopy;  Laterality: N/A;   ERCP N/A 09/08/2015   Procedure: ENDOSCOPIC RETROGRADE CHOLANGIOPANCREATOGRAPHY (ERCP);  Surgeon: Daneil Dolin, MD;  Location: [facility];  Service: Endoscopy;  Laterality: N/A;  with bile duct brushings   HERNIA REPAIR     umbilical    SHOULDER BONE SPUR Left    RESECTION   SPHINCTEROTOMY N/A 09/06/2015   Procedure: SPHINCTEROTOMY;  Surgeon: Daneil Dolin, MD;  Location: [facility];  Service: Endoscopy;  Laterality: N/A;   TONSILLECTOMY      Family Psychiatric History: none noted this visit  Family History:  Family History  Problem Relation Age of Onset   Stroke Mother    Diabetes Mother    Heart Problems Mother    Parkinsonism Father    Heart Problems Father     Social History:  Social History   Socioeconomic History   Marital status: Married    Spouse name: Vaughan Basta   Number of children: 4   Years of education: college   Highest education level: Not on file  Occupational History   Occupation: Retired  Tobacco Use   Smoking status: Never   Smokeless tobacco: Never  Vaping Use   Vaping Use: Never used  Substance and Sexual Activity   Alcohol use: No   Drug use: No   Sexual activity: Not Currently    Birth control/protection: None  Other Topics Concern   Not on file  Social History Narrative   Patient is married Vaughan Basta) and lives at home with his wife.   Patient is left-handed.   Patient drinks one soda and 1 glass of tea with caffeine   Social Determinants of Health   Financial Resource Strain: Not on file  Food Insecurity: Not on file  Transportation Needs: Not on file  Physical Activity: Not on file  Stress: Not on file  Social Connections: Not on file    Allergies:  Allergies  Allergen Reactions   Aspirin Other (See Comments)    Stomach ulcers.   Coconut Oil     Other reaction(s): GI Upset (intolerance)   Penicillins Rash    Has patient had a PCN reaction causing immediate rash, facial/tongue/throat swelling, SOB or lightheadedness with hypotension: no Has patient had a PCN reaction causing severe rash involving mucus membranes or skin necrosis: No Has patient had a PCN reaction that required hospitalization No Has patient had a PCN reaction occurring within the last 10 years:  No If all of the above answers are "NO", then may proceed with Cephalosporin use.     Metabolic Disorder Labs: Lab Results  Component Value Date   HGBA1C 6.2 (H) 01/29/2018   MPG 131.24 01/29/2018   No results found for: PROLACTIN No results found for: CHOL, TRIG, HDL, CHOLHDL, VLDL, LDLCALC No results found for: TSH  Therapeutic Level Labs: No results found for: LITHIUM No results found for: VALPROATE No components found for:  CBMZ  Current Medications: Current Outpatient Medications  Medication Sig Dispense Refill   aspirin 81 MG tablet Take 81 mg by mouth daily.     Azelastine HCl 0.15 % SOLN Place 1 spray into both nostrils daily as needed (congestion).   0   Carbidopa-Levodopa ER (SINEMET CR) 25-100 MG  tablet controlled release TAKE TWO TABLETS BY MOUTH EVERY MORNING, TWO TABLETS AT LUNCH, ONE TABLET AT dinner, AND ONE TABLET AT BEDTIME 540 tablet 1   Carboxymethylcellul-Glycerin 1-0.9 % GEL Place 1 drop into both eyes daily as needed (dry eyes).     cyclobenzaprine (FLEXERIL) 10 MG tablet Take 10 mg by mouth as needed for muscle spasms.     escitalopram (LEXAPRO) 10 MG tablet Take 5 mg by mouth daily.     furosemide (LASIX) 20 MG tablet Take 20 mg by mouth as needed.     meloxicam (MOBIC) 15 MG tablet Take 15 mg by mouth as needed for pain.     metFORMIN (GLUCOPHAGE) 500 MG tablet Take 500 mg by mouth daily with breakfast.      metoprolol tartrate (LOPRESSOR) 25 MG tablet Take 12.5 mg by mouth daily.      OLANZapine (ZYPREXA) 10 MG tablet Take 1 tablet (10 mg total) by mouth at bedtime.     pantoprazole (PROTONIX) 40 MG tablet Take 40 mg by mouth daily.  1   potassium chloride SA (K-DUR,KLOR-CON) 20 MEQ tablet Take 20 mEq by mouth as needed.      pramipexole (MIRAPEX) 0.75 MG tablet TAKE ONE TABLET BY MOUTH THREE TIMES DAILY 270 tablet 0   selegiline (ELDEPRYL) 5 MG tablet Take 5 mg by mouth 2 (two) times daily with a meal.     simvastatin (ZOCOR) 40 MG tablet Take 20 mg  by mouth at bedtime.      valsartan-hydrochlorothiazide (DIOVAN HCT) 160-12.5 MG tablet Take 1 tablet by mouth daily.     No current facility-administered medications for this visit.    Medication Side Effects: none  Orders placed this visit:  No orders of the defined types were placed in this encounter.   Psychiatric Specialty Exam:  Review of Systems  Constitutional: Negative.   HENT:  Positive for hearing loss.   Musculoskeletal:  Positive for back pain, joint swelling and neck pain.  Allergic/Immunologic: Negative.   Neurological:  Positive for dizziness, tremors, speech difficulty and weakness.  Hematological:  Bruises/bleeds easily.   There were no vitals taken for this visit.There is no height or weight on file to calculate BMI.  General Appearance: Casual, Neat, Well Groomed, and Using Rollator  Eye Contact:  Fair  Speech:  Clear and Coherent  Volume:  Decreased  Mood:  Depressed and Dysphoric  Affect:  Non-Congruent and Depressed  Thought Process:  Disorganized  Orientation:  Full (Time, Place, and Person)  Thought Content: Delusions, Hallucinations: Visual, and Paranoid Ideation   Suicidal Thoughts:  No  Homicidal Thoughts:  No  Memory:  Remote;   Fair  Judgement:  Impaired  Insight:  Fair  Psychomotor Activity:  Decreased  Concentration:  Concentration: Fair  Recall:  [employer] of Knowledge: Fair  Language: Fair  Assets:  Desire for Improvement Physical Health Resilience Social Support  ADL's:  Impaired  Cognition: Impaired,  Mild  Prognosis:  Poor   Screenings:  PHQ2-9    Flowsheet Row Office Visit from 04/07/2021 in [facility]  PHQ-2 Total Score 4  PHQ-9 Total Score 19      Flowsheet Row ED from 03/19/2021 in [place] No Risk       Receiving Psychotherapy: No   Treatment Plan/Recommendations:   Greater than 50% of  60 min face to face time with patient was spent on counseling  and coordination of care. We discussed long  hx of Parkinson's Disease, anxiety, depression. We also discussed more recent hx of increase in visual hallucinations, and odd or bizarre behaviors most likely related to advancement of disease related dementia causing psychotic episodes. These behaviors typically become more severe in the evening hours which is characteristic of sundowning. This is more common in middle to late stage dementia but is more complicated with Parkinson's. I discussed with family that we are very limited to safe and effective treatment with medications for the patients advanced condition. Expressed my concerns over previous trials of medications and we may be close to reaching our limit on medications available  short of over sedation or increased risk of injury. Spouse expressed that she did not want this at this time due to his hypersomnia now.   We agreed to;  Continue Zyprexa 10 mg daily at bedtime Will start Seroquel 12.5 mg daily in the evening after supper before dark. If there are no problems, will take one whole tablet 25 mg at the same time frame. To continue Lexapro 10 mg tablet daily Will report worsening symptoms promptly To follow up in 4 weeks to reassess Provided emergency contact information    [name], NP

## 2021-04-15 DIAGNOSIS — E782 Mixed hyperlipidemia: Secondary | ICD-10-CM | POA: Diagnosis not present

## 2021-04-15 DIAGNOSIS — E1169 Type 2 diabetes mellitus with other specified complication: Secondary | ICD-10-CM | POA: Diagnosis not present

## 2021-04-19 DIAGNOSIS — E119 Type 2 diabetes mellitus without complications: Secondary | ICD-10-CM | POA: Diagnosis not present

## 2021-04-19 DIAGNOSIS — E782 Mixed hyperlipidemia: Secondary | ICD-10-CM | POA: Diagnosis not present

## 2021-04-19 DIAGNOSIS — I10 Essential (primary) hypertension: Secondary | ICD-10-CM | POA: Diagnosis not present

## 2021-04-20 DIAGNOSIS — Z1211 Encounter for screening for malignant neoplasm of colon: Secondary | ICD-10-CM | POA: Diagnosis not present

## 2021-04-20 DIAGNOSIS — I10 Essential (primary) hypertension: Secondary | ICD-10-CM | POA: Diagnosis not present

## 2021-04-20 DIAGNOSIS — R443 Hallucinations, unspecified: Secondary | ICD-10-CM | POA: Diagnosis not present

## 2021-04-20 DIAGNOSIS — Z89421 Acquired absence of other right toe(s): Secondary | ICD-10-CM | POA: Diagnosis not present

## 2021-04-20 DIAGNOSIS — D638 Anemia in other chronic diseases classified elsewhere: Secondary | ICD-10-CM | POA: Diagnosis not present

## 2021-04-20 DIAGNOSIS — E782 Mixed hyperlipidemia: Secondary | ICD-10-CM | POA: Diagnosis not present

## 2021-04-20 DIAGNOSIS — E1169 Type 2 diabetes mellitus with other specified complication: Secondary | ICD-10-CM | POA: Diagnosis not present

## 2021-04-20 DIAGNOSIS — F03911 Unspecified dementia, unspecified severity, with agitation: Secondary | ICD-10-CM | POA: Diagnosis not present

## 2021-04-20 DIAGNOSIS — K839 Disease of biliary tract, unspecified: Secondary | ICD-10-CM | POA: Diagnosis not present

## 2021-04-20 DIAGNOSIS — G20 Parkinson's disease: Secondary | ICD-10-CM | POA: Diagnosis not present

## 2021-04-20 DIAGNOSIS — I739 Peripheral vascular disease, unspecified: Secondary | ICD-10-CM | POA: Diagnosis not present

## 2021-04-24 DIAGNOSIS — G20 Parkinson's disease: Secondary | ICD-10-CM | POA: Diagnosis not present

## 2021-04-30 DIAGNOSIS — F03911 Unspecified dementia, unspecified severity, with agitation: Secondary | ICD-10-CM | POA: Diagnosis not present

## 2021-04-30 DIAGNOSIS — R443 Hallucinations, unspecified: Secondary | ICD-10-CM | POA: Diagnosis not present

## 2021-05-04 ENCOUNTER — Ambulatory Visit: Payer: PPO | Admitting: Adult Health

## 2021-05-04 ENCOUNTER — Other Ambulatory Visit: Payer: Self-pay

## 2021-05-04 ENCOUNTER — Encounter: Payer: Self-pay | Admitting: Adult Health

## 2021-05-04 VITALS — BP 138/74 | HR 65 | Ht 69.0 in | Wt 202.6 lb

## 2021-05-04 DIAGNOSIS — G20 Parkinson's disease: Secondary | ICD-10-CM | POA: Diagnosis not present

## 2021-05-04 DIAGNOSIS — R269 Unspecified abnormalities of gait and mobility: Secondary | ICD-10-CM | POA: Diagnosis not present

## 2021-05-04 DIAGNOSIS — W19XXXA Unspecified fall, initial encounter: Secondary | ICD-10-CM

## 2021-05-04 MED ORDER — CARBIDOPA-LEVODOPA ER 25-100 MG PO TBCR: EXTENDED_RELEASE_TABLET | ORAL | 1 refills | Status: DC

## 2021-05-04 NOTE — Progress Notes (Signed)
? ? ?PATIENT: Elijah [name] ?DOB: Apr 07, 1940 ? ?REASON FOR VISIT: follow up ?HISTORY FROM: patient ?PRIMARY NEUROLOGIST: Dr . Rexene Alberts ? ?HISTORY OF PRESENT ILLNESS: ?Today 05/04/21: ? ?Elijah [name] is a 81 year old male with a history of Parkinson's disease.  He returns today for follow-up. ? ?Currently taking Sinemet CR 2 tablets in the AM, lunch, 1 tablet at dinner and 2 tablets at bedtime. Remains on Selegiline 5 mg twice a day. Continue on Mirapex 0.75 TID.  ? ?Did see psychiatry who placed him on seroquel but was having hallucinations and they spoke to PCP who increased Seroquel twice a day. Wife is not sure that it has really helped.  ? ?Wife reports that legs get weak if he sits for a long period of time. Reports that he fell the other night. No injures. Several falls within the last year. Wife reports that he has not had any physical therapy. ? ?Reports good appetite. No trouble chewing or swallowing food. Wife assist with all ADLs. Tremor in the left hand is stable but wife has noticed a tremor in the jaw.  ? ?HISTORY (copied from Dr. Guadelupe Sabin note) ?01/03/2021: He reports very little of his own history.  His wife reports that he has had worsening hallucinations and delusions.  When she had called last week reporting worsening delusions, I had advised her to make an appointment with his primary care physician to discuss a referral to geriatric psychiatry.  He has been on Zyprexa and Lexapro.  I had explained that Zyprexa can cause side effects particularly in Parkinson's medication and is not a drug that I would favor for him.  She reports that approximately a year and a half ago he was admitted to [facility] for mood and behavioral issues.  Patient in a behavioral facility in [place] for about a week.  He was started on Zyprexa and Lexapro at the time.   ?  ?They sleep in separate bedrooms and have done so for the past 15 to 20 years.  His father had Parkinson's disease and patient reports that his  tremor is worse on the left side and that his symptoms started about 20 years ago.  Wife recalls that his tremor started about 10 to 15 years ago on the left side.  He has not fallen recently.  He is currently on Sinemet CR 2 pills at 8 or 9 AM, 2 pills at 12:30 or 1 PM, 2 pills around 10 PM which is bedtime and 1 pill around 5:30 PM or 6:30 PM which is dinnertime.  He takes the Sinemet CR with mealtimes typically.  He is also on Mirapex 0.75 mg 3 times a day and selegiline 5 mg twice daily.  He tries to hydrate well with water.  He takes Zyprexa 10 mg daily and Lexapro 10 mg daily.  He uses a rolling walker at all times. ?  ? ?REVIEW OF SYSTEMS: Out of a complete 14 system review of symptoms, the patient complains only of the following symptoms, and all other reviewed systems are negative. ? ?ALLERGIES: ?Allergies  ?Allergen Reactions  ? Aspirin Other (See Comments)  ?  Stomach ulcers.  ? Coconut Oil   ?  Other reaction(s): GI Upset (intolerance)  ? Penicillins Rash  ?  Has patient had a PCN reaction causing immediate rash, facial/tongue/throat swelling, SOB or lightheadedness with hypotension: no ?Has patient had a PCN reaction causing severe rash involving mucus membranes or skin necrosis: No ?Has patient had a PCN reaction  that required hospitalization No ?Has patient had a PCN reaction occurring within the last 10 years: No ?If all of the above answers are "NO", then may proceed with Cephalosporin use. ?  ? ? ?HOME MEDICATIONS: ?Outpatient Medications Prior to Visit  ?Medication Sig Dispense Refill  ? aspirin 81 MG tablet Take 81 mg by mouth daily.    ? Azelastine HCl 0.15 % SOLN Place 1 spray into both nostrils daily as needed (congestion).   0  ? Carbidopa-Levodopa ER (SINEMET CR) 25-100 MG tablet controlled release TAKE TWO TABLETS BY MOUTH EVERY MORNING, TWO TABLETS AT LUNCH, ONE TABLET AT dinner, AND ONE TABLET AT BEDTIME 540 tablet 1  ? Carboxymethylcellul-Glycerin 1-0.9 % GEL Place 1 drop into both eyes  daily as needed (dry eyes).    ? cyclobenzaprine (FLEXERIL) 10 MG tablet Take 10 mg by mouth as needed for muscle spasms.    ? escitalopram (LEXAPRO) 10 MG tablet Take 5 mg by mouth daily.    ? furosemide (LASIX) 20 MG tablet Take 20 mg by mouth as needed.    ? meloxicam (MOBIC) 15 MG tablet Take 15 mg by mouth as needed for pain.    ? metFORMIN (GLUCOPHAGE) 500 MG tablet Take 500 mg by mouth daily with breakfast.     ? metoprolol tartrate (LOPRESSOR) 25 MG tablet Take 12.5 mg by mouth daily.     ? OLANZapine (ZYPREXA) 10 MG tablet Take 1 tablet (10 mg total) by mouth at bedtime.    ? pantoprazole (PROTONIX) 40 MG tablet Take 40 mg by mouth daily.  1  ? potassium chloride SA (K-DUR,KLOR-CON) 20 MEQ tablet Take 20 mEq by mouth as needed.     ? pramipexole (MIRAPEX) 0.75 MG tablet TAKE ONE TABLET BY MOUTH THREE TIMES DAILY 270 tablet 0  ? QUEtiapine (SEROQUEL) 25 MG tablet Take 1 tablet (25 mg total) by mouth at bedtime. 30 tablet 1  ? selegiline (ELDEPRYL) 5 MG tablet Take 5 mg by mouth 2 (two) times daily with a meal.    ? simvastatin (ZOCOR) 40 MG tablet Take 20 mg by mouth at bedtime.     ? valsartan-hydrochlorothiazide (DIOVAN HCT) 160-12.5 MG tablet Take 1 tablet by mouth daily.    ? ?No facility-administered medications prior to visit.  ? ? ?PAST MEDICAL HISTORY: ?Past Medical History:  ?Diagnosis Date  ? Chronic low back pain 03/24/2015  ? Degenerative arthritis   ? Diabetes mellitus without complication ([place])   ? Dyslipidemia   ? Glaucoma   ? Gout 10/07/2012  ? Hypertension   ? Low back pain   ? Obesity   ? OSA (obstructive sleep apnea) 04/10/2014  ? cannot tolerate, PCP aware.  ? Pancreatitis   ? Parkinson disease ([place])   ? Rotator cuff tear   ? Bilateral, no surgery  ? ? ?PAST SURGICAL HISTORY: ?Past Surgical History:  ?Procedure Laterality Date  ? 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 Procedure: AMPUTATION RIGHT THIRD TOE;  Surgeon: Caprice Beaver, DPM;  Location: [facility];  Service: Podiatry;  Laterality: Right;   ? APPENDECTOMY    ? BALLOON DILATION N/A 09/06/2015  ? Procedure: BALLOON DILATION;  Surgeon: Daneil Dolin, MD;  Location: [facility];  Service: Endoscopy;  Laterality: N/A;  ? BILIARY STENT PLACEMENT N/A 09/08/2015  ? Procedure: BILIARY STENT PLACEMENT;  Surgeon: Daneil Dolin, MD;  Location: [facility];  Service: Endoscopy;  Laterality: N/A;  ? ERCP N/A 09/06/2015  ? Procedure: ENDOSCOPIC RETROGRADE CHOLANGIOPANCREATOGRAPHY (ERCP);  Surgeon: Daneil Dolin, MD;  Location: [facility];  Service: Endoscopy;  Laterality: N/A;  ? ERCP N/A 09/08/2015  ? Procedure: ENDOSCOPIC RETROGRADE CHOLANGIOPANCREATOGRAPHY (ERCP);  Surgeon: Daneil Dolin, MD;  Location: [facility];  Service: Endoscopy;  Laterality: N/A;  with bile duct brushings  ? HERNIA REPAIR    ? umbilical  ? SHOULDER BONE SPUR Left   ? RESECTION  ? SPHINCTEROTOMY N/A 09/06/2015  ? Procedure: SPHINCTEROTOMY;  Surgeon: Daneil Dolin, MD;  Location: [facility];  Service: Endoscopy;  Laterality: N/A;  ? TONSILLECTOMY    ? ? ?FAMILY HISTORY: ?Family History  ?Problem Relation Age of Onset  ? Stroke Mother   ? Diabetes Mother   ? Heart Problems Mother   ? Parkinsonism Father   ? Heart Problems Father   ? ? ?SOCIAL HISTORY: ?Social History  ? ?Socioeconomic History  ? Marital status: Married  ?  Spouse name: Vaughan Basta  ? Number of children: 4  ? Years of education: college  ? Highest education level: Not on file  ?Occupational History  ? Occupation: Retired  ?Tobacco Use  ? Smoking status: Never  ? Smokeless tobacco: Never  ?Vaping Use  ? Vaping Use: Never used  ?Substance and Sexual Activity  ? Alcohol use: No  ? Drug use: No  ? Sexual activity: Not Currently  ?  Birth control/protection: None  ?Other Topics Concern  ? Not on file  ?Social History Narrative  ? Patient is married Vaughan Basta) and lives at home with his wife.  ? Patient is left-handed.  ? Patient drinks one soda and 1 glass of tea with caffeine  ? ?Social Determinants of Health  ? ?Financial  Resource Strain: Not on file  ?Food Insecurity: Not on file  ?Transportation Needs: Not on file  ?Physical Activity: Not on file  ?Stress: Not on file  ?Social Connections: Not on file  ?Intimate Partner V

## 2021-05-05 ENCOUNTER — Encounter: Payer: Self-pay | Admitting: Behavioral Health

## 2021-05-05 ENCOUNTER — Ambulatory Visit (INDEPENDENT_AMBULATORY_CARE_PROVIDER_SITE_OTHER): Payer: PPO | Admitting: Behavioral Health

## 2021-05-05 DIAGNOSIS — G20 Parkinson's disease: Secondary | ICD-10-CM | POA: Diagnosis not present

## 2021-05-05 DIAGNOSIS — F331 Major depressive disorder, recurrent, moderate: Secondary | ICD-10-CM

## 2021-05-05 DIAGNOSIS — R4181 Age-related cognitive decline: Secondary | ICD-10-CM

## 2021-05-05 DIAGNOSIS — F411 Generalized anxiety disorder: Secondary | ICD-10-CM | POA: Diagnosis not present

## 2021-05-05 MED ORDER — QUETIAPINE FUMARATE 25 MG PO TABS: ORAL_TABLET | ORAL | 1 refills | Status: DC
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
## 2021-05-19 DIAGNOSIS — F03911 Unspecified dementia, unspecified severity, with agitation: Secondary | ICD-10-CM | POA: Diagnosis not present

## 2021-05-19 DIAGNOSIS — E1142 Type 2 diabetes mellitus with diabetic polyneuropathy: Secondary | ICD-10-CM | POA: Diagnosis not present

## 2021-05-19 DIAGNOSIS — R443 Hallucinations, unspecified: Secondary | ICD-10-CM | POA: Diagnosis not present

## 2021-05-19 DIAGNOSIS — L603 Nail dystrophy: Secondary | ICD-10-CM | POA: Diagnosis not present

## 2021-05-20 DIAGNOSIS — E119 Type 2 diabetes mellitus without complications: Secondary | ICD-10-CM | POA: Diagnosis not present

## 2021-05-20 DIAGNOSIS — I10 Essential (primary) hypertension: Secondary | ICD-10-CM | POA: Diagnosis not present

## 2021-05-25 DIAGNOSIS — G20 Parkinson's disease: Secondary | ICD-10-CM | POA: Diagnosis not present

## 2021-06-16 ENCOUNTER — Encounter: Payer: Self-pay | Admitting: Behavioral Health

## 2021-06-16 ENCOUNTER — Telehealth (INDEPENDENT_AMBULATORY_CARE_PROVIDER_SITE_OTHER): Payer: PPO | Admitting: Behavioral Health

## 2021-06-16 DIAGNOSIS — R4181 Age-related cognitive decline: Secondary | ICD-10-CM

## 2021-06-16 DIAGNOSIS — F411 Generalized anxiety disorder: Secondary | ICD-10-CM | POA: Diagnosis not present

## 2021-06-16 DIAGNOSIS — F331 Major depressive disorder, recurrent, moderate: Secondary | ICD-10-CM

## 2021-06-16 DIAGNOSIS — F22 Delusional disorders: Secondary | ICD-10-CM | POA: Diagnosis not present

## 2021-06-16 DIAGNOSIS — G20 Parkinson's disease: Secondary | ICD-10-CM | POA: Diagnosis not present

## 2021-06-16 MED ORDER — OLANZAPINE 10 MG PO TABS: 10.0000 mg | ORAL_TABLET | Freq: Every day | ORAL | Status: DC
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
## 2021-06-24 DIAGNOSIS — G20 Parkinson's disease: Secondary | ICD-10-CM | POA: Diagnosis not present

## 2021-06-27 ENCOUNTER — Telehealth: Payer: Self-pay | Admitting: Adult Health

## 2021-06-27 ENCOUNTER — Other Ambulatory Visit: Payer: Self-pay | Admitting: Behavioral Health

## 2021-06-27 ENCOUNTER — Telehealth: Payer: Self-pay | Admitting: Behavioral Health

## 2021-06-27 DIAGNOSIS — F411 Generalized anxiety disorder: Secondary | ICD-10-CM

## 2021-06-27 DIAGNOSIS — R4181 Age-related cognitive decline: Secondary | ICD-10-CM

## 2021-06-27 DIAGNOSIS — G20 Parkinson's disease: Secondary | ICD-10-CM

## 2021-06-27 DIAGNOSIS — F331 Major depressive disorder, recurrent, moderate: Secondary | ICD-10-CM

## 2021-06-27 MED ORDER — CARBIDOPA-LEVODOPA ER 25-100 MG PO TBCR
EXTENDED_RELEASE_TABLET | ORAL | 1 refills | Status: DC
Start: 2021-06-27 — End: 2021-08-11

## 2021-06-27 NOTE — Telephone Encounter (Signed)
Please see message from patient. Your note shows 1 tab TID, does not show 2 QHS. Please clarify dosing.   ?

## 2021-06-27 NOTE — Telephone Encounter (Signed)
Under medications and notes, it specifies three times per day, one tab at 10 am, one at 4 pm, and one at 9 pm.

## 2021-06-27 NOTE — Telephone Encounter (Signed)
Pharmacy LVM at 12:44p regarding script for Quetiapine.  Pt's wife said his dose was increased and he should be taking 1 in the AM, in the 1 PM, and 2 at bedtime, but the script is only showing 1 in the AM, 1 in the PM and 1 at bedtime.  Pls send a correct script. ? ?Next appt 10/5 ?

## 2021-06-27 NOTE — Telephone Encounter (Signed)
[facility] calling requesting new prescription of pt Carbidopa-Levodopa ER (SINEMET CR) 25-100 MG tablet controlled release be sent to pharmacy.  ?They have different dosage than what is listed and need prescription of new dosage.  ?[facility] - [place], [place] - [address] Phone:  [phone]  ?Fax:  [phone]  ?  ? ?

## 2021-06-27 NOTE — Telephone Encounter (Signed)
Now at 3:00 today Elijah [name]'s PCP, Dr. Juel Burrow office, called with the same message. The wife had called and asked them help straighten out the prescription for his Quetiapine.  It appears [name] need to be called to clarify the directions for taking the Quetiapine. ?

## 2021-06-28 MED ORDER — QUETIAPINE FUMARATE 25 MG PO TABS: ORAL_TABLET | ORAL | 1 refills | Status: DC

## 2021-06-28 NOTE — Telephone Encounter (Signed)
Per Aaron Edelman okay to send in Rx for 100 mg total per day. Wife notified.  ?

## 2021-07-05 ENCOUNTER — Telehealth: Payer: Self-pay | Admitting: Physician Assistant

## 2021-07-05 DIAGNOSIS — F411 Generalized anxiety disorder: Secondary | ICD-10-CM

## 2021-07-05 DIAGNOSIS — F331 Major depressive disorder, recurrent, moderate: Secondary | ICD-10-CM

## 2021-07-05 DIAGNOSIS — R4181 Age-related cognitive decline: Secondary | ICD-10-CM

## 2021-07-05 DIAGNOSIS — G20 Parkinson's disease: Secondary | ICD-10-CM

## 2021-07-05 NOTE — Telephone Encounter (Signed)
[facility] just called and said that Elijah [name] is there to pick up his RX for his Quetiapine 25 mg and patient is requesting adherence packaging. Can this be done for him? Pharmacy is: ? ?[facility] - [place], [place] - [address] ? ?Phone:  [phone]  ?Fax:  [phone]  ? ? ? ? ? ?

## 2021-07-05 NOTE — Telephone Encounter (Signed)
Unable to reach the pharmacy with multiple attempts and pharmacy closed at 5:00. Will try again tomorrow. Compliance packs are usually 28 days.  ?

## 2021-07-06 MED ORDER — QUETIAPINE FUMARATE 25 MG PO TABS: ORAL_TABLET | ORAL | 0 refills | Status: DC

## 2021-07-06 NOTE — Telephone Encounter (Signed)
Pharmacy packages his medications, was asking for a script for a 90-day supply on his quetiapine. Sent Rx.  ?

## 2021-07-07 ENCOUNTER — Telehealth: Payer: Self-pay

## 2021-07-07 NOTE — Telephone Encounter (Signed)
I think someone put this note under [name] but looks like he sees Elijah [name].  PA submitted and approved for QUETIAPINE 25 MG #360/90 DAY effective 07/07/2021-02/19/2022 with [employer] [id number]

## 2021-07-20 DIAGNOSIS — E782 Mixed hyperlipidemia: Secondary | ICD-10-CM | POA: Diagnosis not present

## 2021-07-20 DIAGNOSIS — I10 Essential (primary) hypertension: Secondary | ICD-10-CM | POA: Diagnosis not present

## 2021-07-20 DIAGNOSIS — E1169 Type 2 diabetes mellitus with other specified complication: Secondary | ICD-10-CM | POA: Diagnosis not present

## 2021-07-25 DIAGNOSIS — G20 Parkinson's disease: Secondary | ICD-10-CM | POA: Diagnosis not present

## 2021-07-26 ENCOUNTER — Emergency Department (HOSPITAL_COMMUNITY): Payer: PPO

## 2021-07-26 ENCOUNTER — Emergency Department (HOSPITAL_COMMUNITY)
Admission: EM | Admit: 2021-07-26 | Discharge: 2021-07-26 | Disposition: A | Discharge status: Home / Self Care | Payer: PPO | Source: Home / Self Care | Attending: Emergency Medicine | Admitting: Emergency Medicine

## 2021-07-26 ENCOUNTER — Encounter (HOSPITAL_COMMUNITY): Payer: Self-pay | Admitting: *Deleted

## 2021-07-26 ENCOUNTER — Other Ambulatory Visit: Payer: Self-pay

## 2021-07-26 DIAGNOSIS — J690 Pneumonitis due to inhalation of food and vomit: Secondary | ICD-10-CM | POA: Diagnosis present

## 2021-07-26 DIAGNOSIS — G9341 Metabolic encephalopathy: Secondary | ICD-10-CM | POA: Diagnosis present

## 2021-07-26 DIAGNOSIS — R600 Localized edema: Secondary | ICD-10-CM | POA: Diagnosis not present

## 2021-07-26 DIAGNOSIS — Z91148 Patient's other noncompliance with medication regimen for other reason: Secondary | ICD-10-CM | POA: Diagnosis not present

## 2021-07-26 DIAGNOSIS — R935 Abnormal findings on diagnostic imaging of other abdominal regions, including retroperitoneum: Secondary | ICD-10-CM | POA: Diagnosis not present

## 2021-07-26 DIAGNOSIS — J9811 Atelectasis: Secondary | ICD-10-CM | POA: Diagnosis not present

## 2021-07-26 DIAGNOSIS — A401 Sepsis due to streptococcus, group B: Secondary | ICD-10-CM | POA: Diagnosis present

## 2021-07-26 DIAGNOSIS — R0689 Other abnormalities of breathing: Secondary | ICD-10-CM | POA: Diagnosis not present

## 2021-07-26 DIAGNOSIS — J80 Acute respiratory distress syndrome: Secondary | ICD-10-CM | POA: Diagnosis not present

## 2021-07-26 DIAGNOSIS — Z66 Do not resuscitate: Secondary | ICD-10-CM | POA: Diagnosis not present

## 2021-07-26 DIAGNOSIS — Z434 Encounter for attention to other artificial openings of digestive tract: Secondary | ICD-10-CM | POA: Diagnosis not present

## 2021-07-26 DIAGNOSIS — R945 Abnormal results of liver function studies: Secondary | ICD-10-CM | POA: Diagnosis not present

## 2021-07-26 DIAGNOSIS — E860 Dehydration: Secondary | ICD-10-CM | POA: Diagnosis present

## 2021-07-26 DIAGNOSIS — Z7982 Long term (current) use of aspirin: Secondary | ICD-10-CM | POA: Insufficient documentation

## 2021-07-26 DIAGNOSIS — Z20822 Contact with and (suspected) exposure to covid-19: Secondary | ICD-10-CM | POA: Diagnosis present

## 2021-07-26 DIAGNOSIS — R17 Unspecified jaundice: Secondary | ICD-10-CM | POA: Diagnosis not present

## 2021-07-26 DIAGNOSIS — R6520 Severe sepsis without septic shock: Secondary | ICD-10-CM | POA: Diagnosis present

## 2021-07-26 DIAGNOSIS — R109 Unspecified abdominal pain: Secondary | ICD-10-CM | POA: Insufficient documentation

## 2021-07-26 DIAGNOSIS — R404 Transient alteration of awareness: Secondary | ICD-10-CM | POA: Diagnosis not present

## 2021-07-26 DIAGNOSIS — M19032 Primary osteoarthritis, left wrist: Secondary | ICD-10-CM | POA: Diagnosis not present

## 2021-07-26 DIAGNOSIS — N1832 Chronic kidney disease, stage 3b: Secondary | ICD-10-CM | POA: Diagnosis present

## 2021-07-26 DIAGNOSIS — R000 Tachycardia, unspecified: Secondary | ICD-10-CM | POA: Diagnosis not present

## 2021-07-26 DIAGNOSIS — M549 Dorsalgia, unspecified: Secondary | ICD-10-CM | POA: Insufficient documentation

## 2021-07-26 DIAGNOSIS — N281 Cyst of kidney, acquired: Secondary | ICD-10-CM | POA: Diagnosis not present

## 2021-07-26 DIAGNOSIS — Z7401 Bed confinement status: Secondary | ICD-10-CM | POA: Diagnosis not present

## 2021-07-26 DIAGNOSIS — K3189 Other diseases of stomach and duodenum: Secondary | ICD-10-CM | POA: Diagnosis not present

## 2021-07-26 DIAGNOSIS — K828 Other specified diseases of gallbladder: Secondary | ICD-10-CM | POA: Diagnosis not present

## 2021-07-26 DIAGNOSIS — E119 Type 2 diabetes mellitus without complications: Secondary | ICD-10-CM | POA: Diagnosis not present

## 2021-07-26 DIAGNOSIS — I4891 Unspecified atrial fibrillation: Secondary | ICD-10-CM | POA: Diagnosis not present

## 2021-07-26 DIAGNOSIS — K8020 Calculus of gallbladder without cholecystitis without obstruction: Secondary | ICD-10-CM | POA: Diagnosis not present

## 2021-07-26 DIAGNOSIS — Z7984 Long term (current) use of oral hypoglycemic drugs: Secondary | ICD-10-CM | POA: Insufficient documentation

## 2021-07-26 DIAGNOSIS — K6389 Other specified diseases of intestine: Secondary | ICD-10-CM | POA: Diagnosis not present

## 2021-07-26 DIAGNOSIS — N179 Acute kidney failure, unspecified: Secondary | ICD-10-CM | POA: Diagnosis present

## 2021-07-26 DIAGNOSIS — F0390 Unspecified dementia without behavioral disturbance: Secondary | ICD-10-CM | POA: Insufficient documentation

## 2021-07-26 DIAGNOSIS — R52 Pain, unspecified: Secondary | ICD-10-CM | POA: Diagnosis not present

## 2021-07-26 DIAGNOSIS — E669 Obesity, unspecified: Secondary | ICD-10-CM | POA: Diagnosis present

## 2021-07-26 DIAGNOSIS — E785 Hyperlipidemia, unspecified: Secondary | ICD-10-CM | POA: Diagnosis present

## 2021-07-26 DIAGNOSIS — M545 Low back pain, unspecified: Secondary | ICD-10-CM | POA: Diagnosis not present

## 2021-07-26 DIAGNOSIS — E872 Acidosis, unspecified: Secondary | ICD-10-CM | POA: Diagnosis present

## 2021-07-26 DIAGNOSIS — R61 Generalized hyperhidrosis: Secondary | ICD-10-CM | POA: Diagnosis not present

## 2021-07-26 DIAGNOSIS — Z79899 Other long term (current) drug therapy: Secondary | ICD-10-CM | POA: Insufficient documentation

## 2021-07-26 DIAGNOSIS — D696 Thrombocytopenia, unspecified: Secondary | ICD-10-CM | POA: Diagnosis not present

## 2021-07-26 DIAGNOSIS — Z978 Presence of other specified devices: Secondary | ICD-10-CM | POA: Diagnosis not present

## 2021-07-26 DIAGNOSIS — I878 Other specified disorders of veins: Secondary | ICD-10-CM | POA: Diagnosis not present

## 2021-07-26 DIAGNOSIS — R079 Chest pain, unspecified: Secondary | ICD-10-CM | POA: Diagnosis not present

## 2021-07-26 DIAGNOSIS — K4090 Unilateral inguinal hernia, without obstruction or gangrene, not specified as recurrent: Secondary | ICD-10-CM | POA: Diagnosis not present

## 2021-07-26 DIAGNOSIS — M47816 Spondylosis without myelopathy or radiculopathy, lumbar region: Secondary | ICD-10-CM | POA: Diagnosis not present

## 2021-07-26 DIAGNOSIS — A419 Sepsis, unspecified organism: Secondary | ICD-10-CM | POA: Diagnosis not present

## 2021-07-26 DIAGNOSIS — Z7189 Other specified counseling: Secondary | ICD-10-CM | POA: Diagnosis not present

## 2021-07-26 DIAGNOSIS — R069 Unspecified abnormalities of breathing: Secondary | ICD-10-CM | POA: Diagnosis not present

## 2021-07-26 DIAGNOSIS — I10 Essential (primary) hypertension: Secondary | ICD-10-CM | POA: Diagnosis not present

## 2021-07-26 DIAGNOSIS — R9431 Abnormal electrocardiogram [ECG] [EKG]: Secondary | ICD-10-CM | POA: Diagnosis not present

## 2021-07-26 DIAGNOSIS — R062 Wheezing: Secondary | ICD-10-CM | POA: Diagnosis not present

## 2021-07-26 DIAGNOSIS — G20 Parkinson's disease: Secondary | ICD-10-CM | POA: Insufficient documentation

## 2021-07-26 DIAGNOSIS — Z515 Encounter for palliative care: Secondary | ICD-10-CM | POA: Diagnosis not present

## 2021-07-26 DIAGNOSIS — J9601 Acute respiratory failure with hypoxia: Secondary | ICD-10-CM | POA: Diagnosis present

## 2021-07-26 DIAGNOSIS — K8001 Calculus of gallbladder with acute cholecystitis with obstruction: Secondary | ICD-10-CM | POA: Diagnosis present

## 2021-07-26 DIAGNOSIS — N200 Calculus of kidney: Secondary | ICD-10-CM | POA: Diagnosis not present

## 2021-07-26 DIAGNOSIS — R4182 Altered mental status, unspecified: Secondary | ICD-10-CM | POA: Diagnosis not present

## 2021-07-26 DIAGNOSIS — B951 Streptococcus, group B, as the cause of diseases classified elsewhere: Secondary | ICD-10-CM | POA: Diagnosis not present

## 2021-07-26 DIAGNOSIS — R0902 Hypoxemia: Secondary | ICD-10-CM | POA: Diagnosis not present

## 2021-07-26 DIAGNOSIS — Z820 Family history of epilepsy and other diseases of the nervous system: Secondary | ICD-10-CM | POA: Diagnosis not present

## 2021-07-26 DIAGNOSIS — E1122 Type 2 diabetes mellitus with diabetic chronic kidney disease: Secondary | ICD-10-CM | POA: Diagnosis present

## 2021-07-26 DIAGNOSIS — F0282 Dementia in other diseases classified elsewhere, unspecified severity, with psychotic disturbance: Secondary | ICD-10-CM | POA: Diagnosis present

## 2021-07-26 DIAGNOSIS — R7881 Bacteremia: Secondary | ICD-10-CM | POA: Diagnosis not present

## 2021-07-26 DIAGNOSIS — R0602 Shortness of breath: Secondary | ICD-10-CM | POA: Diagnosis not present

## 2021-07-26 DIAGNOSIS — F0283 Dementia in other diseases classified elsewhere, unspecified severity, with mood disturbance: Secondary | ICD-10-CM | POA: Diagnosis present

## 2021-07-26 DIAGNOSIS — E870 Hyperosmolality and hypernatremia: Secondary | ICD-10-CM | POA: Diagnosis present

## 2021-07-26 DIAGNOSIS — K810 Acute cholecystitis: Secondary | ICD-10-CM | POA: Diagnosis not present

## 2021-07-26 DIAGNOSIS — R0603 Acute respiratory distress: Secondary | ICD-10-CM | POA: Diagnosis not present

## 2021-07-26 DIAGNOSIS — D6959 Other secondary thrombocytopenia: Secondary | ICD-10-CM | POA: Diagnosis present

## 2021-07-26 DIAGNOSIS — I129 Hypertensive chronic kidney disease with stage 1 through stage 4 chronic kidney disease, or unspecified chronic kidney disease: Secondary | ICD-10-CM | POA: Diagnosis present

## 2021-07-26 DIAGNOSIS — D72829 Elevated white blood cell count, unspecified: Secondary | ICD-10-CM | POA: Diagnosis not present

## 2021-07-26 DIAGNOSIS — I959 Hypotension, unspecified: Secondary | ICD-10-CM | POA: Diagnosis not present

## 2021-07-26 DIAGNOSIS — Z4682 Encounter for fitting and adjustment of non-vascular catheter: Secondary | ICD-10-CM | POA: Diagnosis not present

## 2021-07-26 LAB — CBC WITH DIFFERENTIAL/PLATELET
Abs Immature Granulocytes: 0.06 10*3/uL (ref 0.00–0.07)
Basophils Absolute: 0 10*3/uL (ref 0.0–0.1)
Basophils Relative: 0 %
Eosinophils Absolute: 0 10*3/uL (ref 0.0–0.5)
Eosinophils Relative: 0 %
HCT: 34.9 % — ABNORMAL LOW (ref 39.0–52.0)
Hemoglobin: 11.8 g/dL — ABNORMAL LOW (ref 13.0–17.0)
Immature Granulocytes: 0 %
Lymphocytes Relative: 2 %
Lymphs Abs: 0.4 10*3/uL — ABNORMAL LOW (ref 0.7–4.0)
MCH: 31.5 pg (ref 26.0–34.0)
MCHC: 33.8 g/dL (ref 30.0–36.0)
MCV: 93.1 fL (ref 80.0–100.0)
Monocytes Absolute: 0.5 10*3/uL (ref 0.1–1.0)
Monocytes Relative: 3 %
Neutro Abs: 16.4 10*3/uL — ABNORMAL HIGH (ref 1.7–7.7)
Neutrophils Relative %: 95 %
Platelets: 193 10*3/uL (ref 150–400)
RBC: 3.75 MIL/uL — ABNORMAL LOW (ref 4.22–5.81)
RDW: 14.1 % (ref 11.5–15.5)
WBC: 17.4 10*3/uL — ABNORMAL HIGH (ref 4.0–10.5)
nRBC: 0.1 % (ref 0.0–0.2)

## 2021-07-26 LAB — URINALYSIS, ROUTINE W REFLEX MICROSCOPIC
Bilirubin Urine: NEGATIVE
Glucose, UA: NEGATIVE mg/dL
Hgb urine dipstick: NEGATIVE
Ketones, ur: 5 mg/dL — AB
Leukocytes,Ua: NEGATIVE
Nitrite: NEGATIVE
Protein, ur: 30 mg/dL — AB
Specific Gravity, Urine: 1.045 — ABNORMAL HIGH (ref 1.005–1.030)
pH: 5 (ref 5.0–8.0)

## 2021-07-26 LAB — COMPREHENSIVE METABOLIC PANEL
ALT: 12 U/L (ref 0–44)
AST: 54 U/L — ABNORMAL HIGH (ref 15–41)
Albumin: 3.9 g/dL (ref 3.5–5.0)
Alkaline Phosphatase: 80 U/L (ref 38–126)
Anion gap: 8 (ref 5–15)
BUN: 32 mg/dL — ABNORMAL HIGH (ref 8–23)
CO2: 23 mmol/L (ref 22–32)
Calcium: 8.5 mg/dL — ABNORMAL LOW (ref 8.9–10.3)
Chloride: 102 mmol/L (ref 98–111)
Creatinine, Ser: 1.59 mg/dL — ABNORMAL HIGH (ref 0.61–1.24)
GFR, Estimated: 44 mL/min — ABNORMAL LOW (ref >60)
Glucose, Bld: 144 mg/dL — ABNORMAL HIGH (ref 70–99)
Potassium: 3.9 mmol/L (ref 3.5–5.1)
Sodium: 133 mmol/L — ABNORMAL LOW (ref 135–145)
Total Bilirubin: 2.5 mg/dL — ABNORMAL HIGH (ref 0.3–1.2)
Total Protein: 7.1 g/dL (ref 6.5–8.1)

## 2021-07-26 MED ORDER — IOHEXOL 300 MG/ML  SOLN
100.0000 mL | Freq: Once | INTRAMUSCULAR | Status: AC | PRN
  Administered 2021-07-26: 80 mL via INTRAVENOUS

## 2021-07-26 MED ORDER — METHYL SALICYLATE-LIDO-MENTHOL 4-4-5 % EX PTCH: 1.0000 | MEDICATED_PATCH | Freq: Two times a day (BID) | CUTANEOUS | 0 refills | Status: DC

## 2021-07-26 MED ORDER — SODIUM CHLORIDE 0.9 % IV BOLUS
500.0000 mL | Freq: Once | INTRAVENOUS | Status: AC
  Administered 2021-07-26: 500 mL via INTRAVENOUS

## 2021-07-26 NOTE — Discharge Instructions (Addendum)
As discussed, your evaluation today has been largely reassuring.  But, it is important that you monitor your condition carefully, and do not hesitate to return to the ED if you develop new, or concerning changes in your condition.   Please be sure to stay well-hydrated, drinking plenty of fluids.  For improved back pain control, please begin taking Mobic daily rather than as needed.  In addition to the Mobic, please obtain and use the medicated patches as prescribed.   Please follow-up with your physician for appropriate ongoing care.

## 2021-07-26 NOTE — ED Triage Notes (Signed)
Pt brought in by [facility] for c/o lower back pain and radiates around to lower abdomen; ems reports pt was diaphoretic on scene

## 2021-07-26 NOTE — ED Provider Notes (Signed)
[facility] EMERGENCY DEPARTMENT Provider Note   CSN: [id number] Arrival date & time: 07/26/21  9233     History  Chief Complaint  Patient presents with   Back Pain    Elijah [name] is a 81 y.o. male.  HPI Patient presents via EMS due to concern of back pain.  Patient has dementia and Parkinson's, history is somewhat limited secondary to this level 5 caveat.  Details from EMS, chart review notable for history of Parkinson's, baseline decreased mobility, and about 4 hours ago the patient started complaining of back pain to his wife.  EMS reports the patient was diaphoretic on arrival, but not hemodynamically remarkable.  No reported fall, trauma.  Patient cannot specify additional details of his back pain.  No reported vomiting, abdominal pain.  Patient has a notable midline abdominal scar is unsure of why.    Home Medications Prior to Admission medications   Medication Sig Start Date End Date Taking? Authorizing Provider  aspirin 81 MG tablet Take 81 mg by mouth daily.   Yes [provider]  Azelastine HCl 0.15 % SOLN Place 1 spray into both nostrils daily as needed (congestion).  08/18/15  Yes [provider]  Carbidopa-Levodopa ER (SINEMET CR) 25-100 MG tablet controlled release TAKE TWO TABLETS BY MOUTH EVERY MORNING, TWO TABLETS AT LUNCH, ONE TABLET AT dinner, AND two TABLET AT BEDTIME 06/27/21  Yes Ward Givens, NP  cyclobenzaprine (FLEXERIL) 10 MG tablet Take 10 mg by mouth as needed for muscle spasms.   Yes [provider]  escitalopram (LEXAPRO) 10 MG tablet Take 5 mg by mouth daily. 07/30/19  Yes [provider]  furosemide (LASIX) 20 MG tablet Take 20 mg by mouth as needed for fluid.   Yes [provider]  LORazepam (ATIVAN) 1 MG tablet Take 1 mg by mouth at bedtime. 07/11/21  Yes [provider]  meloxicam (MOBIC) 15 MG tablet Take 15 mg by mouth as needed for pain.   Yes [provider]  metFORMIN (GLUCOPHAGE)  500 MG tablet Take 500 mg by mouth daily with breakfast.  10/05/12  Yes [provider]  Methyl Salicylate-Lido-Menthol 4-4-5 % PTCH Apply 1 patch topically in the morning and at bedtime. These patches may be available over-the-counter as an alternative to the prescription strength.  Substitute as necessary. 07/26/21  Yes Carmin Muskrat, MD  metoprolol tartrate (LOPRESSOR) 25 MG tablet Take 12.5 mg by mouth 2 (two) times daily.   Yes [provider]  OLANZapine (ZYPREXA) 10 MG tablet Take 1 tablet (10 mg total) by mouth at bedtime. 06/16/21  Yes White, Aaron Edelman A, NP  pantoprazole (PROTONIX) 40 MG tablet Take 40 mg by mouth daily. 03/08/14  Yes [provider]  potassium chloride SA (K-DUR,KLOR-CON) 20 MEQ tablet Take 20 mEq by mouth as needed (cramps). 10/24/13  Yes [provider]  pramipexole (MIRAPEX) 0.75 MG tablet TAKE ONE TABLET BY MOUTH THREE TIMES DAILY 01/19/20  Yes Kathrynn Ducking, MD  QUEtiapine (SEROQUEL) 25 MG tablet TAKE ONE TABLET BY MOUTH AT 10am, 4pm, AND 2 tablets at 9pm daily AS DIRECTED 07/06/21  Yes Lesle Chris A, NP  selegiline (ELDEPRYL) 5 MG tablet Take 5 mg by mouth 2 (two) times daily with a meal.   Yes [provider]  simvastatin (ZOCOR) 40 MG tablet Take 20 mg by mouth at bedtime.  09/05/12  Yes [provider]  valsartan-hydrochlorothiazide (DIOVAN HCT) 160-12.5 MG tablet Take 1 tablet by mouth daily. 07/28/20  Yes  Kathrynn Ducking, MD      Allergies    Aspirin, Coconut (cocos nucifera), and Penicillins    Review of Systems   Review of Systems  Unable to perform ROS: Dementia   Physical Exam Updated Vital Signs BP 115/64   Pulse 92   Temp 98.6 F (37 C) (Oral)   Resp (!) 22   Ht '5\' 10"'$  (1.778 m)   Wt 90.7 kg   SpO2 93%   BMI 28.70 kg/m  Physical Exam Vitals and nursing note reviewed.  Constitutional:      Appearance: He is well-developed. He is obese. He is ill-appearing and diaphoretic.  HENT:     Head:  Normocephalic and atraumatic.  Eyes:     Conjunctiva/sclera: Conjunctivae normal.  Cardiovascular:     Rate and Rhythm: Normal rate and regular rhythm.  Pulmonary:     Effort: Pulmonary effort is normal. Tachypnea present. No respiratory distress.     Breath sounds: No stridor.  Abdominal:     General: There is no distension.    Skin:    General: Skin is warm.  Neurological:     Mental Status: He is alert.     Comments: Patient moves all extremities spontaneously, has notable atrophy, baseline tremor  Psychiatric:        Cognition and Memory: Memory is impaired.    ED Results / Procedures / Treatments   Labs (all labs ordered are listed, but only abnormal results are displayed) Labs Reviewed  COMPREHENSIVE METABOLIC PANEL - Abnormal; Notable for the following components:      Result Value   Sodium 133 (*)    Glucose, Bld 144 (*)    BUN 32 (*)    Creatinine, Ser 1.59 (*)    Calcium 8.5 (*)    AST 54 (*)    Total Bilirubin 2.5 (*)    GFR, Estimated 44 (*)    All other components within normal limits  CBC WITH DIFFERENTIAL/PLATELET - Abnormal; Notable for the following components:   WBC 17.4 (*)    RBC 3.75 (*)    Hemoglobin 11.8 (*)    HCT 34.9 (*)    Neutro Abs 16.4 (*)    Lymphs Abs 0.4 (*)    All other components within normal limits  URINALYSIS, ROUTINE W REFLEX MICROSCOPIC - Abnormal; Notable for the following components:   Color, Urine AMBER (*)    Specific Gravity, Urine 1.045 (*)    Ketones, ur 5 (*)    Protein, ur 30 (*)    Bacteria, UA MANY (*)    All other components within normal limits    EKG None  Radiology CT Abdomen Pelvis W Contrast  Result Date: 07/26/2021 CLINICAL DATA:  Abdominal pain, acute, nonlocalized dementia, prior abd surgery, back? pain, diaphoresis, concern for aorta vs. nephrolith EXAM: CT ABDOMEN AND PELVIS WITH CONTRAST TECHNIQUE: Multidetector CT imaging of the abdomen and pelvis was performed using the standard protocol  following bolus administration of intravenous contrast. RADIATION DOSE REDUCTION: This exam was performed according to the departmental dose-optimization program which includes automated exposure control, adjustment of the mA and/or kV according to patient size and/or use of iterative reconstruction technique. CONTRAST:  43m OMNIPAQUE IOHEXOL 300 MG/ML  SOLN COMPARISON:  CT 12/23/2015 FINDINGS: Lower chest: No acute abnormality. Hepatobiliary: No focal liver abnormality is seen. Mild gallbladder distension with layering sludge or tiny stones. There is pneumobilia consistent with history of prior ERCP and sphincterotomy. Pancreas: Unremarkable. No pancreatic ductal dilatation or surrounding  inflammatory changes. Spleen: Normal in size without focal abnormality. Adrenals/Urinary Tract: Adrenal glands are unremarkable. No hydronephrosis or nephrolithiasis. There are nonobstructive bilateral renal stones. Unchanged bilateral renal cysts. There is a right inguinal hernia containing herniated bladder. Stomach/Bowel: The stomach is within normal limits. There is no evidence of bowel obstruction.Prior appendectomy. Scattered colonic diverticula. No diverticulitis. Moderate rectal stool burden. Vascular/Lymphatic: Aortoiliac atherosclerosis. No AAA. No lymphadenopathy. Reproductive: Unremarkable. Other: Right inguinal hernia containing hernia bladder. Fat containing left inguinal hernia. Unchanged anterior abdominal wall scarring. Musculoskeletal: Levoconvex upper lumbar and dextroconvex lower lumbar curvatures. 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 There is severe multilevel facet arthropathy. Mild bilateral hip osteoarthritis. IMPRESSION: Nonobstructive 3 mm renal stones bilaterally. No hydronephrosis or ureterolithiasis. Right inguinal hernia containing distended herniated bladder. Mild gallbladder is tension with layering sludge or small stones. No other findings to  suggest cholecystitis by CT. 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 Aortoiliac atherosclerosis.  No AAA. Moderate rectal stool burden. Electronically Signed   By: Maurine Simmering M.D.   On: 07/26/2021 10:10   DG Chest Port 1 View  Result Date: 07/26/2021 CLINICAL DATA:  leukocytosis, back pain, poss PNA? EXAM: PORTABLE CHEST 1 VIEW COMPARISON:  Radiograph 08/01/2018 FINDINGS: Unchanged cardiomediastinal silhouette. Low lung volumes. No focal airspace disease. No pleural effusion. No pneumothorax. 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 IMPRESSION: Low lung volumes.  No focal airspace disease. High-riding humeral heads bilaterally can be seen in distal rotator cuff tendinopathy. Electronically Signed   By: Maurine Simmering M.D.   On: 07/26/2021 14:09    Procedures Procedures    Medications Ordered in ED Medications  sodium chloride 0.9 % bolus 500 mL (0 mLs Intravenous Stopped 07/26/21 1013)  iohexol (OMNIPAQUE) 300 MG/ML solution 100 mL (80 mLs Intravenous Contrast Given 07/26/21 0935)    ED Course/ Medical Decision Making/ A&P This patient with a Hx of Parkinson's, dementia presents to the ED for concern of back pain, this involves an extensive number of treatment options, and is a complaint that carries with it a high risk of complications and morbidity.    The differential diagnosis includes musculoskeletal, genitourinary, GI systems all considerations, given the patient's inability to specify additional details   Social Determinants of Health:  Age, dementia, limited mobility  Additional history obtained:  Additional history and/or information obtained from EMS, chart review, notable for EMS for HPI, chart review for history of Parkinson's with ongoing management with levodopa   After the initial evaluation, orders, including: Labs were initiated.   Patient  placed on Cardiac and Pulse-Oximetry Monitors. The patient was maintained on a cardiac monitor.  The cardiac monitored showed an rhythm of 90 sinus normal The patient was also maintained on pulse oximetry. The readings were typically 95% room air normal   On repeat evaluation of the patient stayed the same  Lab Tests:  I personally interpreted labs.  The pertinent results include: Leukocytosis, but no increased absolute immature granulocyte count, suggesting possible reactive rather than infection etiology.  Patient also with slightly elevated creatinine  Imaging Studies ordered:  I independently visualized and interpreted imaging which showed no pneumonia on chest x-ray, no acute intra-abdominal processes on abdominal CT I agree with the radiologist interpretation Dispostion / Final MDM: 3:53 PM Following fluid resuscitation the patient's heart rate has normalized, he is awake, alert, sitting upright, speaking much more clearly than on arrival.  Wife, patient, myself had a lengthy conversation about all findings, some suspicion for dehydration and acute on chronic back pain given the reassuring CT scan, x-ray, labs and his improvement here with fluids.  We discussed admission versus close outpatient follow-up in their amenable to the latter with planned medication regimen for his back pain.  No evidence for bacteremia, sepsis, no evidence for urinary tract infection, pneumonia, patient discharged in stable condition.   Final Clinical Impression(s) / ED Diagnoses Final diagnoses:  Bilateral back pain, unspecified back location, unspecified chronicity  Dehydration    Rx / DC Orders ED Discharge Orders          Ordered    Methyl Salicylate-Lido-Menthol 4-4-5 % PTCH  2 times daily        07/26/21 1553              Carmin Muskrat, MD 07/26/21 1554

## 2021-07-28 ENCOUNTER — Emergency Department (HOSPITAL_COMMUNITY): Payer: PPO

## 2021-07-28 ENCOUNTER — Inpatient Hospital Stay (HOSPITAL_COMMUNITY)
Admission: EM | Admit: 2021-07-28 | Discharge: 2021-08-11 | DRG: 871 | Disposition: A | Discharge status: Hospice | Payer: PPO | Attending: Family Medicine | Admitting: Family Medicine

## 2021-07-28 DIAGNOSIS — G20A1 Parkinson's disease without dyskinesia, without mention of fluctuations: Secondary | ICD-10-CM | POA: Diagnosis present

## 2021-07-28 DIAGNOSIS — K4090 Unilateral inguinal hernia, without obstruction or gangrene, not specified as recurrent: Secondary | ICD-10-CM | POA: Diagnosis not present

## 2021-07-28 DIAGNOSIS — Z833 Family history of diabetes mellitus: Secondary | ICD-10-CM

## 2021-07-28 DIAGNOSIS — R000 Tachycardia, unspecified: Secondary | ICD-10-CM | POA: Diagnosis not present

## 2021-07-28 DIAGNOSIS — G20 Parkinson's disease: Secondary | ICD-10-CM | POA: Diagnosis present

## 2021-07-28 DIAGNOSIS — N1832 Chronic kidney disease, stage 3b: Secondary | ICD-10-CM | POA: Diagnosis present

## 2021-07-28 DIAGNOSIS — R17 Unspecified jaundice: Secondary | ICD-10-CM | POA: Diagnosis present

## 2021-07-28 DIAGNOSIS — R9431 Abnormal electrocardiogram [ECG] [EKG]: Secondary | ICD-10-CM | POA: Diagnosis not present

## 2021-07-28 DIAGNOSIS — Z6830 Body mass index (BMI) 30.0-30.9, adult: Secondary | ICD-10-CM

## 2021-07-28 DIAGNOSIS — E860 Dehydration: Secondary | ICD-10-CM | POA: Diagnosis present

## 2021-07-28 DIAGNOSIS — K8050 Calculus of bile duct without cholangitis or cholecystitis without obstruction: Secondary | ICD-10-CM

## 2021-07-28 DIAGNOSIS — Z66 Do not resuscitate: Secondary | ICD-10-CM | POA: Diagnosis not present

## 2021-07-28 DIAGNOSIS — R0603 Acute respiratory distress: Secondary | ICD-10-CM | POA: Diagnosis not present

## 2021-07-28 DIAGNOSIS — F0282 Dementia in other diseases classified elsewhere, unspecified severity, with psychotic disturbance: Secondary | ICD-10-CM | POA: Diagnosis present

## 2021-07-28 DIAGNOSIS — E119 Type 2 diabetes mellitus without complications: Secondary | ICD-10-CM

## 2021-07-28 DIAGNOSIS — D696 Thrombocytopenia, unspecified: Secondary | ICD-10-CM | POA: Diagnosis present

## 2021-07-28 DIAGNOSIS — Z20822 Contact with and (suspected) exposure to covid-19: Secondary | ICD-10-CM | POA: Diagnosis present

## 2021-07-28 DIAGNOSIS — K8001 Calculus of gallbladder with acute cholecystitis with obstruction: Secondary | ICD-10-CM | POA: Diagnosis present

## 2021-07-28 DIAGNOSIS — E870 Hyperosmolality and hypernatremia: Secondary | ICD-10-CM | POA: Diagnosis present

## 2021-07-28 DIAGNOSIS — E872 Acidosis, unspecified: Secondary | ICD-10-CM | POA: Diagnosis present

## 2021-07-28 DIAGNOSIS — R6520 Severe sepsis without septic shock: Secondary | ICD-10-CM | POA: Diagnosis not present

## 2021-07-28 DIAGNOSIS — I129 Hypertensive chronic kidney disease with stage 1 through stage 4 chronic kidney disease, or unspecified chronic kidney disease: Secondary | ICD-10-CM | POA: Diagnosis present

## 2021-07-28 DIAGNOSIS — Z823 Family history of stroke: Secondary | ICD-10-CM

## 2021-07-28 DIAGNOSIS — R600 Localized edema: Secondary | ICD-10-CM | POA: Diagnosis present

## 2021-07-28 DIAGNOSIS — R7881 Bacteremia: Secondary | ICD-10-CM | POA: Diagnosis present

## 2021-07-28 DIAGNOSIS — E785 Hyperlipidemia, unspecified: Secondary | ICD-10-CM

## 2021-07-28 DIAGNOSIS — K8020 Calculus of gallbladder without cholecystitis without obstruction: Secondary | ICD-10-CM

## 2021-07-28 DIAGNOSIS — I4891 Unspecified atrial fibrillation: Secondary | ICD-10-CM | POA: Diagnosis not present

## 2021-07-28 DIAGNOSIS — N200 Calculus of kidney: Secondary | ICD-10-CM | POA: Diagnosis not present

## 2021-07-28 DIAGNOSIS — Z880 Allergy status to penicillin: Secondary | ICD-10-CM

## 2021-07-28 DIAGNOSIS — G8929 Other chronic pain: Secondary | ICD-10-CM | POA: Diagnosis present

## 2021-07-28 DIAGNOSIS — I10 Essential (primary) hypertension: Secondary | ICD-10-CM | POA: Diagnosis present

## 2021-07-28 DIAGNOSIS — A419 Sepsis, unspecified organism: Secondary | ICD-10-CM | POA: Diagnosis not present

## 2021-07-28 DIAGNOSIS — K810 Acute cholecystitis: Secondary | ICD-10-CM | POA: Diagnosis present

## 2021-07-28 DIAGNOSIS — J80 Acute respiratory distress syndrome: Secondary | ICD-10-CM | POA: Diagnosis not present

## 2021-07-28 DIAGNOSIS — R0689 Other abnormalities of breathing: Secondary | ICD-10-CM | POA: Diagnosis not present

## 2021-07-28 DIAGNOSIS — B951 Streptococcus, group B, as the cause of diseases classified elsewhere: Secondary | ICD-10-CM | POA: Diagnosis present

## 2021-07-28 DIAGNOSIS — A401 Sepsis due to streptococcus, group B: Principal | ICD-10-CM | POA: Diagnosis present

## 2021-07-28 DIAGNOSIS — D6959 Other secondary thrombocytopenia: Secondary | ICD-10-CM | POA: Diagnosis present

## 2021-07-28 DIAGNOSIS — Z820 Family history of epilepsy and other diseases of the nervous system: Secondary | ICD-10-CM

## 2021-07-28 DIAGNOSIS — Z7189 Other specified counseling: Secondary | ICD-10-CM

## 2021-07-28 DIAGNOSIS — N179 Acute kidney failure, unspecified: Secondary | ICD-10-CM | POA: Diagnosis present

## 2021-07-28 DIAGNOSIS — F0283 Dementia in other diseases classified elsewhere, unspecified severity, with mood disturbance: Secondary | ICD-10-CM | POA: Diagnosis present

## 2021-07-28 DIAGNOSIS — J9601 Acute respiratory failure with hypoxia: Secondary | ICD-10-CM | POA: Diagnosis present

## 2021-07-28 DIAGNOSIS — Z7984 Long term (current) use of oral hypoglycemic drugs: Secondary | ICD-10-CM

## 2021-07-28 DIAGNOSIS — E1165 Type 2 diabetes mellitus with hyperglycemia: Secondary | ICD-10-CM | POA: Diagnosis present

## 2021-07-28 DIAGNOSIS — E1122 Type 2 diabetes mellitus with diabetic chronic kidney disease: Secondary | ICD-10-CM | POA: Diagnosis present

## 2021-07-28 DIAGNOSIS — R069 Unspecified abnormalities of breathing: Secondary | ICD-10-CM | POA: Diagnosis not present

## 2021-07-28 DIAGNOSIS — E669 Obesity, unspecified: Secondary | ICD-10-CM | POA: Diagnosis present

## 2021-07-28 DIAGNOSIS — Z7982 Long term (current) use of aspirin: Secondary | ICD-10-CM

## 2021-07-28 DIAGNOSIS — Z515 Encounter for palliative care: Secondary | ICD-10-CM

## 2021-07-28 DIAGNOSIS — J690 Pneumonitis due to inhalation of food and vomit: Secondary | ICD-10-CM | POA: Diagnosis present

## 2021-07-28 DIAGNOSIS — G9341 Metabolic encephalopathy: Secondary | ICD-10-CM | POA: Diagnosis present

## 2021-07-28 DIAGNOSIS — M545 Low back pain, unspecified: Secondary | ICD-10-CM | POA: Diagnosis present

## 2021-07-28 DIAGNOSIS — Z79899 Other long term (current) drug therapy: Secondary | ICD-10-CM

## 2021-07-28 LAB — LACTIC ACID, PLASMA: Lactic Acid, Venous: 6.3 mmol/L (ref 0.5–1.9)

## 2021-07-28 LAB — COMPREHENSIVE METABOLIC PANEL
ALT: 8 U/L (ref 0–44)
AST: 83 U/L — ABNORMAL HIGH (ref 15–41)
Albumin: 2.9 g/dL — ABNORMAL LOW (ref 3.5–5.0)
Alkaline Phosphatase: 156 U/L — ABNORMAL HIGH (ref 38–126)
Anion gap: 15 (ref 5–15)
BUN: 57 mg/dL — ABNORMAL HIGH (ref 8–23)
CO2: 16 mmol/L — ABNORMAL LOW (ref 22–32)
Calcium: 8.3 mg/dL — ABNORMAL LOW (ref 8.9–10.3)
Chloride: 99 mmol/L (ref 98–111)
Creatinine, Ser: 2.24 mg/dL — ABNORMAL HIGH (ref 0.61–1.24)
GFR, Estimated: 29 mL/min — ABNORMAL LOW (ref >60)
Glucose, Bld: 151 mg/dL — ABNORMAL HIGH (ref 70–99)
Potassium: 4.6 mmol/L (ref 3.5–5.1)
Sodium: 130 mmol/L — ABNORMAL LOW (ref 135–145)
Total Bilirubin: 5.4 mg/dL — ABNORMAL HIGH (ref 0.3–1.2)
Total Protein: 6.1 g/dL — ABNORMAL LOW (ref 6.5–8.1)

## 2021-07-28 LAB — BLOOD GAS, ARTERIAL
Acid-base deficit: 6.1 mmol/L — ABNORMAL HIGH (ref 0.0–2.0)
Bicarbonate: 16.9 mmol/L — ABNORMAL LOW (ref 20.0–28.0)
Drawn by: 35043
FIO2: 80 %
O2 Saturation: 100 %
Patient temperature: 37.6
pCO2 arterial: 27 mmHg — ABNORMAL LOW (ref 32–48)
pH, Arterial: 7.41 (ref 7.35–7.45)
pO2, Arterial: 188 mmHg — ABNORMAL HIGH (ref 83–108)

## 2021-07-28 LAB — CBG MONITORING, ED: Glucose-Capillary: 149 mg/dL — ABNORMAL HIGH (ref 70–99)

## 2021-07-28 LAB — RESP PANEL BY RT-PCR (FLU A&B, COVID) ARPGX2
Influenza A by PCR: NEGATIVE
Influenza B by PCR: NEGATIVE
SARS Coronavirus 2 by RT PCR: NEGATIVE

## 2021-07-28 LAB — PROTIME-INR
INR: 1.2 (ref 0.8–1.2)
Prothrombin Time: 14.9 seconds (ref 11.4–15.2)

## 2021-07-28 LAB — TROPONIN I (HIGH SENSITIVITY): Troponin I (High Sensitivity): 52 ng/L — ABNORMAL HIGH (ref <18)

## 2021-07-28 MED ORDER — VANCOMYCIN HCL 2000 MG/400ML IV SOLN
2000.0000 mg | Freq: Once | INTRAVENOUS | Status: DC
  Administered 2021-07-29: 2000 mg via INTRAVENOUS
  Filled 2021-07-28: qty 400

## 2021-07-28 MED ORDER — METRONIDAZOLE 500 MG/100ML IV SOLN
500.0000 mg | Freq: Once | INTRAVENOUS | Status: AC
  Administered 2021-07-29: 500 mg via INTRAVENOUS
  Filled 2021-07-28: qty 100

## 2021-07-28 MED ORDER — LACTATED RINGERS IV BOLUS (SEPSIS)
1000.0000 mL | Freq: Once | INTRAVENOUS | Status: AC
  Administered 2021-07-29: 1000 mL via INTRAVENOUS

## 2021-07-28 MED ORDER — SODIUM CHLORIDE 0.9 % IV SOLN
2.0000 g | Freq: Once | INTRAVENOUS | Status: AC
  Administered 2021-07-28: 2 g via INTRAVENOUS
  Filled 2021-07-28: qty 12.5

## 2021-07-28 MED ORDER — LACTATED RINGERS IV BOLUS
1000.0000 mL | Freq: Once | INTRAVENOUS | Status: AC
  Administered 2021-07-28: 1000 mL via INTRAVENOUS

## 2021-07-28 MED ORDER — LACTATED RINGERS IV BOLUS
1000.0000 mL | Freq: Once | INTRAVENOUS | Status: AC
  Administered 2021-07-29: 1000 mL via INTRAVENOUS

## 2021-07-28 MED ORDER — VANCOMYCIN HCL IN DEXTROSE 1-5 GM/200ML-% IV SOLN: 1000.0000 mg | Freq: Once | INTRAVENOUS | Status: DC

## 2021-07-28 NOTE — ED Notes (Signed)
Pt remains in CT at this moment- DR Regenia Skeeter just spoke with pt spouse, this nurse introduced self to pt spouse.

## 2021-07-28 NOTE — ED Triage Notes (Addendum)
Pt BIB [facility] for resp distress. Pt was seen here earlier this week for lower back pain, abd pain, and ShOB. Today pt is in resp distress with labored, tachypneic resp. EMS report SpO2 as 91% on R/A. They placed pt on 4L via N/C. Pt is breathing 50/min. Pt with dementia, c/o L arm pain.

## 2021-07-28 NOTE — ED Provider Notes (Signed)
[facility] Provider Note   CSN: [id number] Arrival date & time: 07/28/21  2207     History  Chief Complaint  Patient presents with   Shortness of Elijah [name] is a 81 y.o. male.  HPI 81 year old male presents with dyspnea.  History is primarily from the wife as the patient has a history of dementia and Parkinson's.  He was brought in here for shortness of breath.  Was here 2 days ago with back pain. Since yesterday he's had dyspnea and increased WOB. Still having back pain, which is acute on chronic.  No obvious urinary symptoms or fevers.  He has been also complaining of left arm pain, especially anytime the wife has tried to pick him up.  Patient denies any current abdominal pain.  Wife was told he was dehydrated last time he was in the ER.  EMS reported his sats to be in the low 90s and he was on 4 L nasal cannula.  Nurse put him on nonrebreather for work of breathing.  Home Medications Prior to Admission medications   Medication Sig Start Date End Date Taking? Authorizing Provider  aspirin 81 MG tablet Take 81 mg by mouth daily.    [provider]  Azelastine HCl 0.15 % SOLN Place 1 spray into both nostrils daily as needed (congestion).  08/18/15   [provider]  Carbidopa-Levodopa ER (SINEMET CR) 25-100 MG tablet controlled release TAKE TWO TABLETS BY MOUTH EVERY MORNING, TWO TABLETS AT LUNCH, ONE TABLET AT dinner, AND two TABLET AT BEDTIME 06/27/21   Ward Givens, NP  cyclobenzaprine (FLEXERIL) 10 MG tablet Take 10 mg by mouth as needed for muscle spasms.    [provider]  escitalopram (LEXAPRO) 10 MG tablet Take 5 mg by mouth daily. 07/30/19   [provider]  furosemide (LASIX) 20 MG tablet Take 20 mg by mouth as needed for fluid.    [provider]  LORazepam (ATIVAN) 1 MG tablet Take 1 mg by mouth at bedtime. 07/11/21   [provider]  meloxicam (MOBIC) 15 MG tablet Take 15 mg by mouth as  needed for pain.    [provider]  metFORMIN (GLUCOPHAGE) 500 MG tablet Take 500 mg by mouth daily with breakfast.  10/05/12   [provider]  Methyl Salicylate-Lido-Menthol 4-4-5 % PTCH Apply 1 patch topically in the morning and at bedtime. These patches may be available over-the-counter as an alternative to the prescription strength.  Substitute as necessary. 07/26/21   Carmin Muskrat, MD  metoprolol tartrate (LOPRESSOR) 25 MG tablet Take 12.5 mg by mouth 2 (two) times daily.    [provider]  OLANZapine (ZYPREXA) 10 MG tablet Take 1 tablet (10 mg total) by mouth at bedtime. 06/16/21   [name], NP  pantoprazole (PROTONIX) 40 MG tablet Take 40 mg by mouth daily. 03/08/14   [provider]  potassium chloride SA (K-DUR,KLOR-CON) 20 MEQ tablet Take 20 mEq by mouth as needed (cramps). 10/24/13   [provider]  pramipexole (MIRAPEX) 0.75 MG tablet TAKE ONE TABLET BY MOUTH THREE TIMES DAILY 01/19/20   Kathrynn Ducking, MD  QUEtiapine (SEROQUEL) 25 MG tablet TAKE ONE TABLET BY MOUTH AT 10am, 4pm, AND 2 tablets at 9pm daily AS DIRECTED 07/06/21   [name], NP  selegiline (ELDEPRYL) 5 MG tablet Take 5 mg by mouth 2 (two) times daily with a meal.    [provider]  simvastatin (ZOCOR) 40 MG tablet Take 20 mg by mouth at bedtime.  09/05/12   [provider]  valsartan-hydrochlorothiazide (DIOVAN HCT) 160-12.5 MG tablet Take 1 tablet by mouth daily. 07/28/20   Kathrynn Ducking, MD      Allergies    Aspirin, Coconut (cocos nucifera), and Penicillins    Review of Systems   Review of Systems  Unable to perform ROS: Dementia    Physical Exam Updated Vital Signs BP 120/90   Pulse 96   Temp 99.7 F (37.6 C) (Rectal)   Resp (!) 37   Wt 90 kg   SpO2 93%   BMI 28.47 kg/m  Physical Exam Vitals and nursing note reviewed.  Constitutional:      General: He is in acute distress.     Appearance: He is well-developed. He is  ill-appearing.     Comments: On a nonrebreather  HENT:     Head: Normocephalic and atraumatic.  Cardiovascular:     Rate and Rhythm: Normal rate and regular rhythm.     Pulses:          Radial pulses are 2+ on the left side.     Heart sounds: Normal heart sounds.  Pulmonary:     Effort: Accessory muscle usage present.     Breath sounds: Normal breath sounds. No wheezing, rhonchi or rales.  Abdominal:     Palpations: Abdomen is soft.     Tenderness: There is no abdominal tenderness.  Musculoskeletal:     Comments: Left wrist is tender with some decreased range of motion but no obvious cellulitis or swelling.  No other focal tenderness upon his left arm or hand.  Skin:    General: Skin is warm and dry.     Comments: Feet are cool bilaterally  Neurological:     Mental Status: He is alert.    ED Results / Procedures / Treatments   Labs (all labs ordered are listed, but only abnormal results are displayed) Labs Reviewed  COMPREHENSIVE METABOLIC PANEL - Abnormal; Notable for the following components:      Result Value   Sodium 130 (*)    CO2 16 (*)    Glucose, Bld 151 (*)    BUN 57 (*)    Creatinine, Ser 2.24 (*)    Calcium 8.3 (*)    Total Protein 6.1 (*)    Albumin 2.9 (*)    AST 83 (*)    Alkaline Phosphatase 156 (*)    Total Bilirubin 5.4 (*)    GFR, Estimated 29 (*)    All other components within normal limits  LACTIC ACID, PLASMA - Abnormal; Notable for the following components:   Lactic Acid, Venous 6.3 (*)    All other components within normal limits  BLOOD GAS, ARTERIAL - Abnormal; Notable for the following components:   pCO2 arterial 27 (*)    pO2, Arterial 188 (*)    Bicarbonate 16.9 (*)    Acid-base deficit 6.1 (*)    All other components within normal limits  CBG MONITORING, ED - Abnormal; Notable for the following components:   Glucose-Capillary 149 (*)    All other components within normal limits  TROPONIN I (HIGH SENSITIVITY) - Abnormal; Notable for  the following components:   Troponin I (High Sensitivity) 52 (*)    All other components within normal limits  CULTURE, BLOOD (ROUTINE X 2)  CULTURE, BLOOD (ROUTINE X 2)  RESP PANEL BY RT-PCR (FLU A&B, COVID) ARPGX2  PROTIME-INR  LACTIC  ACID, PLASMA  CBC WITH DIFFERENTIAL/PLATELET  URINALYSIS, ROUTINE W REFLEX MICROSCOPIC  LIPASE, BLOOD  TROPONIN I (HIGH SENSITIVITY)    EKG None  Radiology DG Chest Port 1 View  Result Date: 07/28/2021 CLINICAL DATA:  Respiratory distress EXAM: PORTABLE CHEST 1 VIEW COMPARISON:  07/26/2021 FINDINGS: Hypoventilatory changes. No consolidation or effusion. Stable cardiomediastinal silhouette. No pneumothorax IMPRESSION: No active disease.  Low lung volumes. Electronically Signed   By: Donavan Foil M.D.   On: 07/28/2021 22:42    Procedures .Critical Care  Performed by: Sherwood Gambler, MD Authorized by: Sherwood Gambler, MD   Critical care provider statement:    Critical care time (minutes):  45   Critical care time was exclusive of:  Separately billable procedures and treating other patients   Critical care was necessary to treat or prevent imminent or life-threatening deterioration of the following conditions:  Sepsis and respiratory failure   Critical care was time spent personally by me on the following activities:  Development of treatment plan with patient or surrogate, discussions with consultants, evaluation of patient's response to treatment, examination of patient, ordering and review of laboratory studies, ordering and review of radiographic studies, ordering and performing treatments and interventions, pulse oximetry, re-evaluation of patient's condition and review of old charts     Medications Ordered in ED Medications  metroNIDAZOLE (FLAGYL) IVPB 500 mg (has no administration in time range)  lactated ringers bolus 1,000 mL (has no administration in time range)  vancomycin (VANCOREADY) IVPB 2000 mg/400 mL (has no administration in time  range)  lactated ringers bolus 1,000 mL (has no administration in time range)  lactated ringers bolus 1,000 mL (1,000 mLs Intravenous New Bag/Given 07/28/21 2236)  ceFEPIme (MAXIPIME) 2 g in sodium chloride 0.9 % 100 mL IVPB (0 g Intravenous Stopped 07/28/21 2356)    ED Course/ Medical Decision Making/ A&P                           Medical Decision Making Amount and/or Complexity of Data Reviewed Independent Historian: spouse External Data Reviewed: labs, radiology and notes. Labs: ordered. Radiology: ordered and independent interpretation performed. ECG/medicine tests: ordered and independent interpretation performed.  Risk Prescription drug management.   Patient is ill-appearing.  He is on a nonrebreather that we were able to wean him down to a nasal cannula.  Its more of a work of breathing problems/tachypnea than it is hypoxia.  Looking at his lab work/work-up, it appears that his tachypnea is more of a acidosis compensation as his lactate is 6, bicarbonate is 15, and CO2 is 27 to give a compensated pH.  He was given broad IV antibiotics and IV fluids.  Lab work shows worsening renal function from a couple days ago and now his bilirubin is up to 5 when it was around 2 the other day.  I will add on a lipase.  He does not have any abdominal tenderness but his CT scan from the other day did show some nonspecific gallbladder sludge without cholecystitis.  I am concerned he has developed new/worsening pathology so he will be sent back for CT abdomen/pelvis.  Chest x-ray image viewed by myself and there is no obvious pneumonia.  I have discussed his critical illness with his wife, who is his power of attorney.  She understands he is ill and likely needs ICU.  She has discussed with patient's children and they would like patient to be intubated if needed for this acute illness.  At this [name] I don't think he needs that but needs close monitoring. Hospitalist paged for admission. Will likely need  abdominal u/s vs MRCP in morning. Care transferred to Dr. Dayna Barker.        Final Clinical Impression(s) / ED Diagnoses Final diagnoses:  Severe sepsis ([place])  Acute kidney injury [facility])    Rx / DC Orders ED Discharge Orders     None         Sherwood Gambler, MD 07/29/21 [phone]

## 2021-07-28 NOTE — ED Notes (Signed)
EDP at bedside  

## 2021-07-29 ENCOUNTER — Inpatient Hospital Stay (HOSPITAL_COMMUNITY): Payer: PPO

## 2021-07-29 ENCOUNTER — Encounter (HOSPITAL_COMMUNITY): Payer: Self-pay | Admitting: Family Medicine

## 2021-07-29 DIAGNOSIS — I878 Other specified disorders of veins: Secondary | ICD-10-CM | POA: Diagnosis not present

## 2021-07-29 DIAGNOSIS — E860 Dehydration: Secondary | ICD-10-CM | POA: Diagnosis present

## 2021-07-29 DIAGNOSIS — K8001 Calculus of gallbladder with acute cholecystitis with obstruction: Secondary | ICD-10-CM | POA: Diagnosis present

## 2021-07-29 DIAGNOSIS — A419 Sepsis, unspecified organism: Principal | ICD-10-CM | POA: Diagnosis present

## 2021-07-29 DIAGNOSIS — G20 Parkinson's disease: Secondary | ICD-10-CM | POA: Diagnosis present

## 2021-07-29 DIAGNOSIS — Z7189 Other specified counseling: Secondary | ICD-10-CM | POA: Diagnosis not present

## 2021-07-29 DIAGNOSIS — I10 Essential (primary) hypertension: Secondary | ICD-10-CM | POA: Diagnosis not present

## 2021-07-29 DIAGNOSIS — R17 Unspecified jaundice: Secondary | ICD-10-CM | POA: Diagnosis not present

## 2021-07-29 DIAGNOSIS — E785 Hyperlipidemia, unspecified: Secondary | ICD-10-CM | POA: Diagnosis present

## 2021-07-29 DIAGNOSIS — N179 Acute kidney failure, unspecified: Secondary | ICD-10-CM | POA: Diagnosis present

## 2021-07-29 DIAGNOSIS — R935 Abnormal findings on diagnostic imaging of other abdominal regions, including retroperitoneum: Secondary | ICD-10-CM | POA: Diagnosis not present

## 2021-07-29 DIAGNOSIS — J9601 Acute respiratory failure with hypoxia: Secondary | ICD-10-CM | POA: Diagnosis present

## 2021-07-29 DIAGNOSIS — E870 Hyperosmolality and hypernatremia: Secondary | ICD-10-CM | POA: Diagnosis present

## 2021-07-29 DIAGNOSIS — Z515 Encounter for palliative care: Secondary | ICD-10-CM | POA: Diagnosis not present

## 2021-07-29 DIAGNOSIS — E872 Acidosis, unspecified: Secondary | ICD-10-CM | POA: Diagnosis present

## 2021-07-29 DIAGNOSIS — K4090 Unilateral inguinal hernia, without obstruction or gangrene, not specified as recurrent: Secondary | ICD-10-CM | POA: Diagnosis not present

## 2021-07-29 DIAGNOSIS — M19032 Primary osteoarthritis, left wrist: Secondary | ICD-10-CM | POA: Diagnosis not present

## 2021-07-29 DIAGNOSIS — J690 Pneumonitis due to inhalation of food and vomit: Secondary | ICD-10-CM | POA: Diagnosis present

## 2021-07-29 DIAGNOSIS — E119 Type 2 diabetes mellitus without complications: Secondary | ICD-10-CM | POA: Diagnosis not present

## 2021-07-29 DIAGNOSIS — K828 Other specified diseases of gallbladder: Secondary | ICD-10-CM | POA: Diagnosis not present

## 2021-07-29 DIAGNOSIS — R079 Chest pain, unspecified: Secondary | ICD-10-CM | POA: Diagnosis not present

## 2021-07-29 DIAGNOSIS — F0283 Dementia in other diseases classified elsewhere, unspecified severity, with mood disturbance: Secondary | ICD-10-CM | POA: Diagnosis present

## 2021-07-29 DIAGNOSIS — Z91148 Patient's other noncompliance with medication regimen for other reason: Secondary | ICD-10-CM | POA: Diagnosis not present

## 2021-07-29 DIAGNOSIS — Z66 Do not resuscitate: Secondary | ICD-10-CM | POA: Diagnosis not present

## 2021-07-29 DIAGNOSIS — E1122 Type 2 diabetes mellitus with diabetic chronic kidney disease: Secondary | ICD-10-CM | POA: Diagnosis present

## 2021-07-29 DIAGNOSIS — B951 Streptococcus, group B, as the cause of diseases classified elsewhere: Secondary | ICD-10-CM | POA: Diagnosis not present

## 2021-07-29 DIAGNOSIS — R7881 Bacteremia: Secondary | ICD-10-CM | POA: Diagnosis not present

## 2021-07-29 DIAGNOSIS — E669 Obesity, unspecified: Secondary | ICD-10-CM | POA: Diagnosis present

## 2021-07-29 DIAGNOSIS — G9341 Metabolic encephalopathy: Secondary | ICD-10-CM | POA: Diagnosis present

## 2021-07-29 DIAGNOSIS — R6520 Severe sepsis without septic shock: Secondary | ICD-10-CM

## 2021-07-29 DIAGNOSIS — K810 Acute cholecystitis: Secondary | ICD-10-CM | POA: Diagnosis not present

## 2021-07-29 DIAGNOSIS — D696 Thrombocytopenia, unspecified: Secondary | ICD-10-CM | POA: Diagnosis not present

## 2021-07-29 DIAGNOSIS — R062 Wheezing: Secondary | ICD-10-CM | POA: Diagnosis not present

## 2021-07-29 DIAGNOSIS — R61 Generalized hyperhidrosis: Secondary | ICD-10-CM | POA: Diagnosis not present

## 2021-07-29 DIAGNOSIS — Z820 Family history of epilepsy and other diseases of the nervous system: Secondary | ICD-10-CM | POA: Diagnosis not present

## 2021-07-29 DIAGNOSIS — I129 Hypertensive chronic kidney disease with stage 1 through stage 4 chronic kidney disease, or unspecified chronic kidney disease: Secondary | ICD-10-CM | POA: Diagnosis present

## 2021-07-29 DIAGNOSIS — D6959 Other secondary thrombocytopenia: Secondary | ICD-10-CM | POA: Diagnosis present

## 2021-07-29 DIAGNOSIS — K8020 Calculus of gallbladder without cholecystitis without obstruction: Secondary | ICD-10-CM | POA: Diagnosis not present

## 2021-07-29 DIAGNOSIS — R0602 Shortness of breath: Secondary | ICD-10-CM | POA: Diagnosis not present

## 2021-07-29 DIAGNOSIS — J9811 Atelectasis: Secondary | ICD-10-CM | POA: Diagnosis not present

## 2021-07-29 DIAGNOSIS — M47816 Spondylosis without myelopathy or radiculopathy, lumbar region: Secondary | ICD-10-CM | POA: Diagnosis not present

## 2021-07-29 DIAGNOSIS — K6389 Other specified diseases of intestine: Secondary | ICD-10-CM | POA: Diagnosis not present

## 2021-07-29 DIAGNOSIS — Z4682 Encounter for fitting and adjustment of non-vascular catheter: Secondary | ICD-10-CM | POA: Diagnosis not present

## 2021-07-29 DIAGNOSIS — N1832 Chronic kidney disease, stage 3b: Secondary | ICD-10-CM | POA: Diagnosis present

## 2021-07-29 DIAGNOSIS — A401 Sepsis due to streptococcus, group B: Secondary | ICD-10-CM | POA: Diagnosis present

## 2021-07-29 DIAGNOSIS — R600 Localized edema: Secondary | ICD-10-CM | POA: Diagnosis not present

## 2021-07-29 DIAGNOSIS — R4182 Altered mental status, unspecified: Secondary | ICD-10-CM | POA: Diagnosis not present

## 2021-07-29 DIAGNOSIS — Z20822 Contact with and (suspected) exposure to covid-19: Secondary | ICD-10-CM | POA: Diagnosis present

## 2021-07-29 DIAGNOSIS — F0282 Dementia in other diseases classified elsewhere, unspecified severity, with psychotic disturbance: Secondary | ICD-10-CM | POA: Diagnosis present

## 2021-07-29 DIAGNOSIS — Z434 Encounter for attention to other artificial openings of digestive tract: Secondary | ICD-10-CM | POA: Diagnosis not present

## 2021-07-29 DIAGNOSIS — R945 Abnormal results of liver function studies: Secondary | ICD-10-CM | POA: Diagnosis not present

## 2021-07-29 DIAGNOSIS — K3189 Other diseases of stomach and duodenum: Secondary | ICD-10-CM | POA: Diagnosis not present

## 2021-07-29 DIAGNOSIS — I4891 Unspecified atrial fibrillation: Secondary | ICD-10-CM | POA: Diagnosis not present

## 2021-07-29 LAB — URINALYSIS, ROUTINE W REFLEX MICROSCOPIC
Bilirubin Urine: NEGATIVE
Glucose, UA: 50 mg/dL — AB
Ketones, ur: 5 mg/dL — AB
Leukocytes,Ua: NEGATIVE
Nitrite: NEGATIVE
Protein, ur: 30 mg/dL — AB
Specific Gravity, Urine: 1.023 (ref 1.005–1.030)
pH: 5 (ref 5.0–8.0)

## 2021-07-29 LAB — BLOOD CULTURE ID PANEL (REFLEXED) - BCID2

## 2021-07-29 LAB — CBC WITH DIFFERENTIAL/PLATELET
Abs Immature Granulocytes: 1.6 10*3/uL — ABNORMAL HIGH (ref 0.00–0.07)
Abs Immature Granulocytes: 0.15 10*3/uL — ABNORMAL HIGH (ref 0.00–0.07)
Band Neutrophils: 4 %
Basophils Absolute: 0 10*3/uL (ref 0.0–0.1)
Basophils Absolute: 0.1 10*3/uL (ref 0.0–0.1)
Basophils Relative: 0 %
Basophils Relative: 1 %
Eosinophils Absolute: 0 10*3/uL (ref 0.0–0.5)
Eosinophils Absolute: 0 10*3/uL (ref 0.0–0.5)
Eosinophils Relative: 0 %
Eosinophils Relative: 0 %
HCT: 35.3 % — ABNORMAL LOW (ref 39.0–52.0)
HCT: 30.9 % — ABNORMAL LOW (ref 39.0–52.0)
Hemoglobin: 12.3 g/dL — ABNORMAL LOW (ref 13.0–17.0)
Hemoglobin: 10.6 g/dL — ABNORMAL LOW (ref 13.0–17.0)
Immature Granulocytes: 1 %
Lymphocytes Relative: 5 %
Lymphocytes Relative: 3 %
Lymphs Abs: 0.6 10*3/uL — ABNORMAL LOW (ref 0.7–4.0)
Lymphs Abs: 0.4 10*3/uL — ABNORMAL LOW (ref 0.7–4.0)
MCH: 32 pg (ref 26.0–34.0)
MCH: 31.9 pg (ref 26.0–34.0)
MCHC: 34.8 g/dL (ref 30.0–36.0)
MCHC: 34.3 g/dL (ref 30.0–36.0)
MCV: 91.9 fL (ref 80.0–100.0)
MCV: 93.1 fL (ref 80.0–100.0)
Metamyelocytes Relative: 11 %
Monocytes Absolute: 0 10*3/uL — ABNORMAL LOW (ref 0.1–1.0)
Monocytes Absolute: 0.4 10*3/uL (ref 0.1–1.0)
Monocytes Relative: 0 %
Monocytes Relative: 3 %
Myelocytes: 2 %
Neutro Abs: 9.2 10*3/uL — ABNORMAL HIGH (ref 1.7–7.7)
Neutro Abs: 10.3 10*3/uL — ABNORMAL HIGH (ref 1.7–7.7)
Neutrophils Relative %: 77 %
Neutrophils Relative %: 92 %
Platelets: 107 10*3/uL — ABNORMAL LOW (ref 150–400)
Platelets: 90 10*3/uL — ABNORMAL LOW (ref 150–400)
Promyelocytes Relative: 1 %
RBC: 3.84 MIL/uL — ABNORMAL LOW (ref 4.22–5.81)
RBC: 3.32 MIL/uL — ABNORMAL LOW (ref 4.22–5.81)
RDW: 14.4 % (ref 11.5–15.5)
RDW: 14.6 % (ref 11.5–15.5)
WBC: 11.4 10*3/uL — ABNORMAL HIGH (ref 4.0–10.5)
WBC: 11.2 10*3/uL — ABNORMAL HIGH (ref 4.0–10.5)
nRBC: 0 % (ref 0.0–0.2)
nRBC: 0 % (ref 0.0–0.2)

## 2021-07-29 LAB — MAGNESIUM: Magnesium: 1.6 mg/dL — ABNORMAL LOW (ref 1.7–2.4)

## 2021-07-29 LAB — BLOOD GAS, ARTERIAL
Acid-base deficit: 4.5 mmol/L — ABNORMAL HIGH (ref 0.0–2.0)
Bicarbonate: 19 mmol/L — ABNORMAL LOW (ref 20.0–28.0)
Drawn by: 27733
FIO2: 60 %
O2 Saturation: 100 %
Patient temperature: 36.5
pCO2 arterial: 29 mmHg — ABNORMAL LOW (ref 32–48)
pH, Arterial: 7.42 (ref 7.35–7.45)
pO2, Arterial: 180 mmHg — ABNORMAL HIGH (ref 83–108)

## 2021-07-29 LAB — VITAMIN B12: Vitamin B-12: 1036 pg/mL — ABNORMAL HIGH (ref 180–914)

## 2021-07-29 LAB — HEPATITIS PANEL, ACUTE
HCV Ab: NONREACTIVE
Hep A IgM: NONREACTIVE
Hep B C IgM: NONREACTIVE
Hepatitis B Surface Ag: NONREACTIVE

## 2021-07-29 LAB — LACTIC ACID, PLASMA
Lactic Acid, Venous: 4.6 mmol/L (ref 0.5–1.9)
Lactic Acid, Venous: 2.8 mmol/L (ref 0.5–1.9)
Lactic Acid, Venous: 2 mmol/L (ref 0.5–1.9)

## 2021-07-29 LAB — COMPREHENSIVE METABOLIC PANEL
ALT: 10 U/L (ref 0–44)
AST: 80 U/L — ABNORMAL HIGH (ref 15–41)
Albumin: 2.4 g/dL — ABNORMAL LOW (ref 3.5–5.0)
Alkaline Phosphatase: 123 U/L (ref 38–126)
Anion gap: 10 (ref 5–15)
BUN: 59 mg/dL — ABNORMAL HIGH (ref 8–23)
CO2: 20 mmol/L — ABNORMAL LOW (ref 22–32)
Calcium: 7.9 mg/dL — ABNORMAL LOW (ref 8.9–10.3)
Chloride: 103 mmol/L (ref 98–111)
Creatinine, Ser: 2.3 mg/dL — ABNORMAL HIGH (ref 0.61–1.24)
GFR, Estimated: 28 mL/min — ABNORMAL LOW (ref >60)
Glucose, Bld: 137 mg/dL — ABNORMAL HIGH (ref 70–99)
Potassium: 4.3 mmol/L (ref 3.5–5.1)
Sodium: 133 mmol/L — ABNORMAL LOW (ref 135–145)
Total Bilirubin: 5.2 mg/dL — ABNORMAL HIGH (ref 0.3–1.2)
Total Protein: 5.2 g/dL — ABNORMAL LOW (ref 6.5–8.1)

## 2021-07-29 LAB — TSH: TSH: 2.436 u[IU]/mL (ref 0.350–4.500)

## 2021-07-29 LAB — CBG MONITORING, ED: Glucose-Capillary: 131 mg/dL — ABNORMAL HIGH (ref 70–99)

## 2021-07-29 LAB — GLUCOSE, CAPILLARY
Glucose-Capillary: 124 mg/dL — ABNORMAL HIGH (ref 70–99)
Glucose-Capillary: 145 mg/dL — ABNORMAL HIGH (ref 70–99)
Glucose-Capillary: 140 mg/dL — ABNORMAL HIGH (ref 70–99)

## 2021-07-29 LAB — LIPASE, BLOOD
Lipase: 13 U/L (ref 11–51)
Lipase: 13 U/L (ref 11–51)

## 2021-07-29 LAB — PROTIME-INR
INR: 1.4 — ABNORMAL HIGH (ref 0.8–1.2)
Prothrombin Time: 16.6 seconds — ABNORMAL HIGH (ref 11.4–15.2)

## 2021-07-29 LAB — PROCALCITONIN: Procalcitonin: 32.54 ng/mL

## 2021-07-29 LAB — CORTISOL-AM, BLOOD: Cortisol - AM: 57.2 ug/dL — ABNORMAL HIGH (ref 6.7–22.6)

## 2021-07-29 LAB — TROPONIN I (HIGH SENSITIVITY): Troponin I (High Sensitivity): 51 ng/L — ABNORMAL HIGH (ref <18)

## 2021-07-29 LAB — ACETAMINOPHEN LEVEL: Acetaminophen (Tylenol), Serum: <10 ug/mL — ABNORMAL LOW (ref 10–30)

## 2021-07-29 LAB — MRSA NEXT GEN BY PCR, NASAL: MRSA by PCR Next Gen: NOT DETECTED

## 2021-07-29 MED ORDER — ONDANSETRON HCL 4 MG/2ML IJ SOLN: 4.0000 mg | Freq: Four times a day (QID) | INTRAMUSCULAR | Status: DC | PRN

## 2021-07-29 MED ORDER — ACETAMINOPHEN 325 MG PO TABS
650.0000 mg | ORAL_TABLET | Freq: Four times a day (QID) | ORAL | Status: DC | PRN
  Administered 2021-08-04 – 2021-08-10 (×3): 650 mg via ORAL
  Filled 2021-07-29 (×3): qty 2

## 2021-07-29 MED ORDER — FUROSEMIDE 10 MG/ML IJ SOLN
30.0000 mg | Freq: Once | INTRAMUSCULAR | Status: AC
  Administered 2021-07-29: 30 mg via INTRAVENOUS
  Filled 2021-07-29: qty 4

## 2021-07-29 MED ORDER — MAGNESIUM SULFATE IN D5W 1-5 GM/100ML-% IV SOLN
1.0000 g | Freq: Once | INTRAVENOUS | Status: AC
  Administered 2021-07-29: 1 g via INTRAVENOUS
  Filled 2021-07-29: qty 100

## 2021-07-29 MED ORDER — MAGNESIUM SULFATE 50 % IJ SOLN
1.0000 g | Freq: Once | INTRAMUSCULAR | Status: DC
  Filled 2021-07-29: qty 2

## 2021-07-29 MED ORDER — ACETAMINOPHEN 650 MG RE SUPP
650.0000 mg | Freq: Four times a day (QID) | RECTAL | Status: DC | PRN
  Administered 2021-07-29: 650 mg via RECTAL
  Filled 2021-07-29: qty 1

## 2021-07-29 MED ORDER — VANCOMYCIN VARIABLE DOSE PER UNSTABLE RENAL FUNCTION (PHARMACIST DOSING): Status: DC

## 2021-07-29 MED ORDER — OXYCODONE HCL 5 MG PO TABS: 5.0000 mg | ORAL_TABLET | ORAL | Status: DC | PRN

## 2021-07-29 MED ORDER — QUETIAPINE FUMARATE 25 MG PO TABS
25.0000 mg | ORAL_TABLET | Freq: Two times a day (BID) | ORAL | Status: DC
  Filled 2021-07-29: qty 1

## 2021-07-29 MED ORDER — SODIUM CHLORIDE 0.9 % IV SOLN: INTRAVENOUS | Status: DC

## 2021-07-29 MED ORDER — ASPIRIN 81 MG PO CHEW
81.0000 mg | CHEWABLE_TABLET | Freq: Every day | ORAL | Status: DC
Start: 2021-07-29 — End: 2021-08-03
  Administered 2021-07-29: 81 mg via ORAL
  Filled 2021-07-29: qty 1

## 2021-07-29 MED ORDER — QUETIAPINE FUMARATE 25 MG PO TABS: 25.0000 mg | ORAL_TABLET | Freq: Three times a day (TID) | ORAL | Status: DC

## 2021-07-29 MED ORDER — ALBUTEROL SULFATE (2.5 MG/3ML) 0.083% IN NEBU
INHALATION_SOLUTION | RESPIRATORY_TRACT | Status: AC
  Administered 2021-07-29: 2.5 mg
  Filled 2021-07-29: qty 3

## 2021-07-29 MED ORDER — SELEGILINE HCL 5 MG PO TABS
5.0000 mg | ORAL_TABLET | Freq: Two times a day (BID) | ORAL | Status: DC
Start: 2021-07-29 — End: 2021-07-29
  Filled 2021-07-29 (×7): qty 1

## 2021-07-29 MED ORDER — LORAZEPAM 2 MG/ML IJ SOLN
1.0000 mg | Freq: Once | INTRAMUSCULAR | Status: AC
  Administered 2021-07-29: 1 mg via INTRAVENOUS
  Filled 2021-07-29: qty 1

## 2021-07-29 MED ORDER — IPRATROPIUM-ALBUTEROL 0.5-2.5 (3) MG/3ML IN SOLN
3.0000 mL | Freq: Four times a day (QID) | RESPIRATORY_TRACT | Status: DC | PRN
Start: 2021-07-29 — End: 2021-08-11
  Administered 2021-07-29 – 2021-07-31 (×2): 3 mL via RESPIRATORY_TRACT
  Filled 2021-07-29 (×2): qty 3

## 2021-07-29 MED ORDER — CHLORHEXIDINE GLUCONATE CLOTH 2 % EX PADS
6.0000 | MEDICATED_PAD | Freq: Every day | CUTANEOUS | Status: DC
  Administered 2021-07-29 – 2021-08-10 (×13): 6 via TOPICAL

## 2021-07-29 MED ORDER — HEPARIN SODIUM (PORCINE) 5000 UNIT/ML IJ SOLN
5000.0000 [IU] | Freq: Three times a day (TID) | INTRAMUSCULAR | Status: DC
  Administered 2021-07-29 – 2021-07-31 (×7): 5000 [IU] via SUBCUTANEOUS
  Filled 2021-07-29 (×8): qty 1

## 2021-07-29 MED ORDER — CARBIDOPA-LEVODOPA ER 25-100 MG PO TBCR
2.0000 | EXTENDED_RELEASE_TABLET | Freq: Three times a day (TID) | ORAL | Status: DC
Start: 2021-07-29 — End: 2021-08-11
  Administered 2021-07-29 – 2021-08-10 (×21): 2 via ORAL
  Filled 2021-07-29 (×12): qty 2
  Filled 2021-07-29: qty 1
  Filled 2021-07-29 (×8): qty 2

## 2021-07-29 MED ORDER — CARBIDOPA-LEVODOPA ER 25-100 MG PO TBCR
1.0000 | EXTENDED_RELEASE_TABLET | Freq: Every day | ORAL | Status: DC
Start: 2021-07-29 — End: 2021-08-11
  Administered 2021-08-03 – 2021-08-10 (×8): 1 via ORAL
  Filled 2021-07-29: qty 2
  Filled 2021-07-29: qty 1
  Filled 2021-07-29: qty 2
  Filled 2021-07-29 (×2): qty 1
  Filled 2021-07-29: qty 2
  Filled 2021-07-29 (×2): qty 1

## 2021-07-29 MED ORDER — SODIUM CHLORIDE 0.9 % IV SOLN
2.0000 g | Freq: Two times a day (BID) | INTRAVENOUS | Status: DC
  Administered 2021-07-29: 2 g via INTRAVENOUS
  Filled 2021-07-29: qty 12.5

## 2021-07-29 MED ORDER — CARBIDOPA-LEVODOPA ER 25-100 MG PO TBCR: 2.0000 | EXTENDED_RELEASE_TABLET | Freq: Two times a day (BID) | ORAL | Status: DC

## 2021-07-29 MED ORDER — PRAMIPEXOLE DIHYDROCHLORIDE 0.25 MG PO TABS
0.7500 mg | ORAL_TABLET | Freq: Three times a day (TID) | ORAL | Status: DC
  Administered 2021-07-29 – 2021-07-30 (×2): 0.75 mg via ORAL
  Filled 2021-07-29 (×22): qty 3

## 2021-07-29 MED ORDER — METRONIDAZOLE 500 MG/100ML IV SOLN
500.0000 mg | Freq: Two times a day (BID) | INTRAVENOUS | Status: DC
  Administered 2021-07-29 – 2021-07-31 (×4): 500 mg via INTRAVENOUS
  Filled 2021-07-29 (×4): qty 100

## 2021-07-29 MED ORDER — LORAZEPAM 1 MG PO TABS: 1.0000 mg | ORAL_TABLET | Freq: Every day | ORAL | Status: DC

## 2021-07-29 MED ORDER — SODIUM CHLORIDE 0.9 % IV SOLN: 2.0000 g | INTRAVENOUS | Status: DC

## 2021-07-29 MED ORDER — OLANZAPINE 5 MG PO TABS
10.0000 mg | ORAL_TABLET | Freq: Every day | ORAL | Status: DC
  Administered 2021-07-30: 10 mg via ORAL
  Filled 2021-07-29: qty 2

## 2021-07-29 MED ORDER — SIMVASTATIN 20 MG PO TABS
20.0000 mg | ORAL_TABLET | Freq: Every day | ORAL | Status: DC
  Administered 2021-07-30: 20 mg via ORAL
  Filled 2021-07-29: qty 1

## 2021-07-29 MED ORDER — ESCITALOPRAM OXALATE 10 MG PO TABS
5.0000 mg | ORAL_TABLET | Freq: Every day | ORAL | Status: DC
  Administered 2021-07-29: 5 mg via ORAL
  Filled 2021-07-29: qty 1

## 2021-07-29 MED ORDER — BUDESONIDE 0.5 MG/2ML IN SUSP
0.5000 mg | Freq: Two times a day (BID) | RESPIRATORY_TRACT | Status: DC
  Administered 2021-07-29 – 2021-08-11 (×27): 0.5 mg via RESPIRATORY_TRACT
  Filled 2021-07-29 (×27): qty 2

## 2021-07-29 MED ORDER — ONDANSETRON HCL 4 MG PO TABS: 4.0000 mg | ORAL_TABLET | Freq: Four times a day (QID) | ORAL | Status: DC | PRN

## 2021-07-29 MED ORDER — METOPROLOL TARTRATE 25 MG PO TABS
12.5000 mg | ORAL_TABLET | Freq: Two times a day (BID) | ORAL | Status: DC
  Administered 2021-07-30: 12.5 mg via ORAL
  Filled 2021-07-29 (×2): qty 1

## 2021-07-29 MED ORDER — LABETALOL HCL 5 MG/ML IV SOLN: 5.0000 mg | Freq: Four times a day (QID) | INTRAVENOUS | Status: DC | PRN

## 2021-07-29 MED ORDER — QUETIAPINE FUMARATE 25 MG PO TABS
50.0000 mg | ORAL_TABLET | Freq: Every day | ORAL | Status: DC
Start: 2021-07-29 — End: 2021-08-03
  Administered 2021-07-30: 50 mg via ORAL
  Filled 2021-07-29: qty 2

## 2021-07-29 NOTE — Progress Notes (Signed)
PHARMACY - PHYSICIAN COMMUNICATION CRITICAL VALUE ALERT - BLOOD CULTURE IDENTIFICATION (BCID)  Elijah [name] is an 81 y.o. male who presented to [facility] on 07/28/2021   Assessment:  group B strep in 4/4 blood cultures  Name of physician (or Provider) Contacted: Dr. Dyann Kief  Current antibiotics: Vanco,cefepime, flagyl  Changes to prescribed antibiotics recommended:  Recommendations accepted by provider  Results for orders placed or performed during the hospital encounter of 07/28/21  Blood Culture ID Panel (Reflexed) (Collected: 07/28/2021 10:33 PM)  Result Value Ref Range   Enterococcus faecalis NOT DETECTED NOT DETECTED   Enterococcus Faecium NOT DETECTED NOT DETECTED   Listeria monocytogenes NOT DETECTED NOT DETECTED   Staphylococcus species NOT DETECTED NOT DETECTED   Staphylococcus aureus (BCID) NOT DETECTED NOT DETECTED   Staphylococcus epidermidis NOT DETECTED NOT DETECTED   Staphylococcus lugdunensis NOT DETECTED NOT DETECTED   Streptococcus species DETECTED (A) NOT DETECTED   Streptococcus agalactiae DETECTED (A) NOT DETECTED   Streptococcus pneumoniae NOT DETECTED NOT DETECTED   Streptococcus pyogenes NOT DETECTED NOT DETECTED   A.calcoaceticus-baumannii NOT DETECTED NOT DETECTED   Bacteroides fragilis NOT DETECTED NOT DETECTED   Enterobacterales NOT DETECTED NOT DETECTED   Enterobacter cloacae complex NOT DETECTED NOT DETECTED   Escherichia coli NOT DETECTED NOT DETECTED   Klebsiella aerogenes NOT DETECTED NOT DETECTED   Klebsiella oxytoca NOT DETECTED NOT DETECTED   Klebsiella pneumoniae NOT DETECTED NOT DETECTED   Proteus species NOT DETECTED NOT DETECTED   Salmonella species NOT DETECTED NOT DETECTED   Serratia marcescens NOT DETECTED NOT DETECTED   Haemophilus influenzae NOT DETECTED NOT DETECTED   Neisseria meningitidis NOT DETECTED NOT DETECTED   Pseudomonas aeruginosa NOT DETECTED NOT DETECTED   Stenotrophomonas maltophilia NOT DETECTED NOT DETECTED    Candida albicans NOT DETECTED NOT DETECTED   Candida auris NOT DETECTED NOT DETECTED   Candida glabrata NOT DETECTED NOT DETECTED   Candida krusei NOT DETECTED NOT DETECTED   Candida parapsilosis NOT DETECTED NOT DETECTED   Candida tropicalis NOT DETECTED NOT DETECTED   Cryptococcus neoformans/gattii NOT DETECTED NOT DETECTED    [name] 07/29/2021  3:53 PM

## 2021-07-29 NOTE — ED Notes (Signed)
Elijah Kief, MD notified re: +cocci in blood cultures reported by lab

## 2021-07-29 NOTE — Assessment & Plan Note (Addendum)
-  Secondary to sepsis -Initial lactic acid 6.3, repeat 4.6>> last one 1.1 -improved with IVF

## 2021-07-29 NOTE — Assessment & Plan Note (Addendum)
-  Heart rate 100, respiratory rate 30-44, lactic acidosis 6.3, AKI 2.24 -Bacteremia, acute cholecystitis and aspiration pneumonitis -Continue IV Rocephin -WBC's trending down after chole tube -Off BPAP and with good saturation demonstrated on 4-5 L [place] supplementation with great saturation. -COVID and flu are negative

## 2021-07-29 NOTE — Consult Note (Addendum)
[facility] Consult  Reason for Consult: ? Gallstones/ gallbladder  Referring Physician:  Dr. Dyann Kief  Chief Complaint   Shortness of Breath     HPI: Elijah [name] is a 81 y.o. male with Parkinson's disease, Obesity, DM who is currently on Bipap and unable to answer questions. His wife is in the room and reports that he was his regular self requiring help with dressing and activities of daily living but still able to move with a walker and did mow his around under watchful guidance. She says he has some dementia but overall has been doing fair in the last few weeks. He has a history back in 2017 of gallstone pancreatitis which we can see in our records. She reports that at that time Dr. Gala Romney was concerned for cholangiocarcinoma and sent him to [facility] after some brushings of the bile duct came back with abnormal cells. He followed at [facility] for 1 year and had multiple ERCPs and brushing that I can see and none were positive for any cancer or atypical cells. The wife says the 57 at [facility] told him he did not have cholangiocarcinoma and he had his stents removed and was done fine after that time.  He has documentation of a normal bilirubin in 2021.   He came into the hospital on 6/6 to the ED and was worked up but ultimately sent home because nothing was identified and he came back to the hospital after having labored breathing and calling EMS.  He has required Bipap and is encephalopathic at this time. He has bacteremia with Strep. The patient was started on antibiotics. Due to an elevated LFTs, lactic acidosis and leukocytosis the night physician was worried about acute cholecystitis and [place] was ordered for the AM.  This AM he had the [place] was demonstrated concern for a dilated CBD. I ordered and MRI earlier in the  day and unfortunately due to this respiratory rate and Bipap he could not get it. He then improved and I went down with him to get his MRCP.    This unfortunately was limited but does not show choledocholithiasis. I have notified Dr. Laural Golden of my concerns for possible choledocholithiasis and he is in agreement that this is a concern.   Past Medical History:  Diagnosis Date   Chronic low back pain 03/24/2015   Degenerative arthritis    Diabetes mellitus without complication ([place])    Dyslipidemia    Glaucoma    Gout 10/07/2012   Hypertension    Low back pain    Obesity    OSA (obstructive sleep apnea) 04/10/2014   cannot tolerate, PCP aware.   Pancreatitis    Parkinson disease ([place])    Rotator cuff tear    Bilateral, no surgery    Past Surgical History:  Procedure Laterality Date   AMPUTATION Right 01/31/2018   Procedure: AMPUTATION RIGHT THIRD TOE;  Surgeon: Caprice Beaver, DPM;  Location: [facility];  Service: Podiatry;  Laterality: Right;   APPENDECTOMY     BALLOON DILATION N/A 09/06/2015   Procedure: BALLOON DILATION;  Surgeon: Daneil Dolin, MD;  Location: [facility] ENDO SU[facility];  Service: Endoscopy;  Laterality: N/A;   BILIARY STENT PLACEMENT N/A 09/08/2015   Procedure: BILIARY STENT PLACEMENT;  Surgeon: Daneil Dolin, MD;  Location: [facility] SU[facility];  Service: Endoscopy;  Laterality: N/A;   ERCP N/A 09/06/2015   Procedure: ENDOSCOPIC RETROGRADE CHOLANGIOPANCREATOGRAPHY (ERCP);  Surgeon: Daneil Dolin, MD;  Location: [facility];  Service: Endoscopy;  Laterality: N/A;   ERCP N/A 09/08/2015   Procedure: ENDOSCOPIC RETROGRADE CHOLANGIOPANCREATOGRAPHY (ERCP);  Surgeon: Daneil Dolin, MD;  Location: [facility];  Service: Endoscopy;  Laterality: N/A;  with bile duct brushings   HERNIA REPAIR     umbilical   SHOULDER BONE SPUR Left    RESECTION   SPHINCTEROTOMY N/A 09/06/2015   Procedure: SPHINCTEROTOMY;  Surgeon: Daneil Dolin, MD;  Location: [facility];  Service: Endoscopy;  Laterality: N/A;   TONSILLECTOMY      Family History  Problem Relation Age of Onset   Stroke Mother    Diabetes Mother    Heart Problems  Mother    Parkinsonism Father    Heart Problems Father     Social History   Tobacco Use   Smoking status: Never   Smokeless tobacco: Never  Vaping Use   Vaping Use: Never used  Substance Use Topics   Alcohol use: No   Drug use: No    Medications: I have reviewed the patient's current medications. Prior to Admission:  Medications Prior to Admission  Medication Sig Dispense Refill Last Dose   aspirin 81 MG tablet Take 81 mg by mouth daily.   Past Month   Azelastine HCl 0.15 % SOLN Place 1 spray into both nostrils daily as needed (congestion).   0 UNK   Carbidopa-Levodopa ER (SINEMET CR) 25-100 MG tablet controlled release TAKE TWO TABLETS BY MOUTH EVERY MORNING, TWO TABLETS AT LUNCH, ONE TABLET AT dinner, AND two TABLET AT BEDTIME (Patient taking differently: Take 1-2 tablets by mouth in the morning, at noon, in the evening, and at bedtime. 2 tabs in the am, 2 tabs at lunch, 1 tab at dinner, AND 2 tabs at bedtime) 630 tablet 1 07/28/2021   cyclobenzaprine (FLEXERIL) 10 MG tablet Take 10 mg by mouth as needed for muscle spasms.   Past Week   escitalopram (LEXAPRO) 10 MG tablet Take 5 mg by mouth daily.   07/28/2021   furosemide (LASIX) 20 MG tablet Take 20 mg by mouth as needed for fluid.   UNK   LORazepam (ATIVAN) 1 MG tablet Take 1 mg by mouth at bedtime.   07/27/2021   meloxicam (MOBIC) 15 MG tablet Take 15 mg by mouth daily.   07/28/2021   metFORMIN (GLUCOPHAGE) 500 MG tablet Take 500 mg by mouth daily with breakfast.    07/28/2021   metoprolol tartrate (LOPRESSOR) 25 MG tablet Take 12.5 mg by mouth 2 (two) times daily.   07/28/2021 at 0900   OLANZapine (ZYPREXA) 10 MG tablet Take 1 tablet (10 mg total) by mouth at bedtime.   07/27/2021   pantoprazole (PROTONIX) 40 MG tablet Take 40 mg by mouth daily.  1 07/28/2021   potassium chloride SA (K-DUR,KLOR-CON) 20 MEQ tablet Take 20 mEq by mouth as needed (cramps).   UNK   pramipexole (MIRAPEX) 0.75 MG tablet TAKE ONE TABLET BY MOUTH THREE TIMES DAILY  (Patient taking differently: Take 0.75 mg by mouth 3 (three) times daily.) 270 tablet 0 07/28/2021   QUEtiapine (SEROQUEL) 25 MG tablet TAKE ONE TABLET BY MOUTH AT 10am, 4pm, AND 2 tablets at 9pm daily AS DIRECTED (Patient taking differently: Take 25-50 mg by mouth 3 (three) times daily. 1 tablet at 10am, 4pm, AND 2 tablets at 9pm daily) 360 tablet 0 07/28/2021   selegiline (ELDEPRYL) 5 MG tablet Take 5 mg by mouth 2 (two) times daily with a meal.   07/28/2021   simvastatin (ZOCOR) 40  MG tablet Take 20 mg by mouth at bedtime.    07/27/2021   valsartan-hydrochlorothiazide (DIOVAN-HCT) 80-12.5 MG tablet Take 1 tablet by mouth daily.   07/28/2021   Scheduled:  aspirin  81 mg Oral Daily   budesonide (PULMICORT) nebulizer solution  0.5 mg Nebulization BID   Carbidopa-Levodopa ER  1 tablet Oral Q supper   Carbidopa-Levodopa ER  2 tablet Oral TID   Chlorhexidine Gluconate Cloth  6 each Topical Q0600   escitalopram  5 mg Oral Daily   heparin  5,000 Units Subcutaneous Q8H   metoprolol tartrate  12.5 mg Oral BID   OLANZapine  10 mg Oral QHS   pramipexole  0.75 mg Oral TID   QUEtiapine  25 mg Oral BID   QUEtiapine  50 mg Oral QHS   simvastatin  20 mg Oral QHS   Continuous:  sodium chloride 50 mL/hr at 07/29/21 1150   ceFEPime (MAXIPIME) IV     metronidazole 500 mg (07/29/21 1128)   CXK:GYJEHUDJSHFWY **OR** acetaminophen, ipratropium-albuterol, ondansetron **OR** ondansetron (ZOFRAN) IV  Allergies  Allergen Reactions   Aspirin Other (See Comments)    Stomach ulcers.   Coconut (Cocos Nucifera)     Other reaction(s): GI Upset (intolerance)   Penicillins Rash    Has patient had a PCN reaction causing immediate rash, facial/tongue/throat swelling, SOB or lightheadedness with hypotension: no Has patient had a PCN reaction causing severe rash involving mucus membranes or skin necrosis: No Has patient had a PCN reaction that required hospitalization No Has patient had a PCN reaction occurring within the  last 10 years: No If all of the above answers are "NO", then may proceed with Cephalosporin use.      ROS:  Review of systems not obtained due to patient factors.  Wife reports labored breathing and abdominal pain   Blood pressure 136/84, pulse 90, temperature 98.5 F (36.9 C), temperature source Axillary, resp. rate 19, height '5\' 10"'$  (1.778 m), weight 98.2 kg, SpO2 97 %. Physical Exam Vitals reviewed.  Constitutional:      Appearance: He is obese.  HENT:     Head: Normocephalic.  Eyes:     Comments: Eyes closed   Cardiovascular:     Rate and Rhythm: Normal rate.  Pulmonary:     Comments: Tachypnea  Abdominal:     General: There is distension.     Palpations: Abdomen is soft.     Tenderness: There is abdominal tenderness. There is no guarding or rebound.  Musculoskeletal:     Right lower leg: Edema present.     Left lower leg: Edema present.  Skin:    General: Skin is warm.  Neurological:     Mental Status: He is disoriented.     Results: Results for orders placed or performed during the hospital encounter of 07/28/21 (from the past 48 hour(s))  Comprehensive metabolic panel     Status: Abnormal   Collection Time: 07/28/21 10:31 PM  Result Value Ref Range   Sodium 130 (L) 135 - 145 mmol/L   Potassium 4.6 3.5 - 5.1 mmol/L   Chloride 99 98 - 111 mmol/L   CO2 16 (L) 22 - 32 mmol/L   Glucose, Bld 151 (H) 70 - 99 mg/dL    Comment: Glucose reference range applies only to samples taken after fasting for at least 8 hours.   BUN 57 (H) 8 - 23 mg/dL   Creatinine, Ser 2.24 (H) 0.61 - 1.24 mg/dL   Calcium 8.3 (L) 8.9 - 10.3  mg/dL   Total Protein 6.1 (L) 6.5 - 8.1 g/dL   Albumin 2.9 (L) 3.5 - 5.0 g/dL   AST 83 (H) 15 - 41 U/L   ALT 8 0 - 44 U/L   Alkaline Phosphatase 156 (H) 38 - 126 U/L   Total Bilirubin 5.4 (H) 0.3 - 1.2 mg/dL   GFR, Estimated 29 (L) >60 mL/min    Comment: (NOTE) Calculated using the CKD-EPI Creatinine Equation (2021)    Anion gap 15 5 - 15     Comment: Performed at [facility], [address] [place], [place] [address]  CBC with Differential     Status: Abnormal   Collection Time: 07/28/21 10:31 PM  Result Value Ref Range   WBC 11.4 (H) 4.0 - 10.5 K/uL   RBC 3.84 (L) 4.22 - 5.81 MIL/uL   Hemoglobin 12.3 (L) 13.0 - 17.0 g/dL   HCT 35.3 (L) 39.0 - 52.0 %   MCV 91.9 80.0 - 100.0 fL   MCH 32.0 26.0 - 34.0 pg   MCHC 34.8 30.0 - 36.0 g/dL   RDW 14.4 11.5 - 15.5 %   Platelets 107 (L) 150 - 400 K/uL    Comment: SPECIMEN CHECKED FOR CLOTS Immature Platelet Fraction may be clinically indicated, consider ordering this additional test [id number] PLATELET COUNT CONFIRMED BY SMEAR    nRBC 0.0 0.0 - 0.2 %   Neutrophils Relative % 77 %   Neutro Abs 9.2 (H) 1.7 - 7.7 K/uL   Band Neutrophils 4 %   Lymphocytes Relative 5 %   Lymphs Abs 0.6 (L) 0.7 - 4.0 K/uL   Monocytes Relative 0 %   Monocytes Absolute 0.0 (L) 0.1 - 1.0 K/uL   Eosinophils Relative 0 %   Eosinophils Absolute 0.0 0.0 - 0.5 K/uL   Basophils Relative 0 %   Basophils Absolute 0.0 0.0 - 0.1 K/uL   RBC Morphology MORPHOLOGY UNREMARKABLE    Metamyelocytes Relative 11 %   Myelocytes 2 %   Promyelocytes Relative 1 %   Abs Immature Granulocytes 1.60 (H) 0.00 - 0.07 K/uL    Comment: Performed at [facility], [address], [place], [place] [address]  Protime-INR     Status: None   Collection Time: 07/28/21 10:31 PM  Result Value Ref Range   Prothrombin Time 14.9 11.4 - 15.2 seconds   INR 1.2 0.8 - 1.2    Comment: (NOTE) INR goal varies based on device and disease states. Performed at [facility], [address], [place], [place] [address]   Troponin I (High Sensitivity)     Status: Abnormal   Collection Time: 07/28/21 10:31 PM  Result Value Ref Range   Troponin I (High Sensitivity) 52 (H) <18 ng/L    Comment: (NOTE) Elevated high sensitivity troponin I (hsTnI) values and significant  changes across serial measurements may suggest ACS but many other  chronic and  acute conditions are known to elevate hsTnI results.  Refer to the "Links" section for chest pain algorithms and additional  guidance. Performed at [facility], [address], [place], [place] [address]   Lipase, blood     Status: None   Collection Time: 07/28/21 10:31 PM  Result Value Ref Range   Lipase 13 11 - 51 U/L    Comment: Performed at [facility], [address], [place], [place] [address]  Lactic acid, plasma     Status: Abnormal   Collection Time: 07/28/21 10:33 PM  Result Value Ref Range   Lactic Acid, Venous  6.3 (HH) 0.5 - 1.9 mmol/L    Comment: CRITICAL RESULT CALLED TO, READ BACK BY AND VERIFIED WITH: CHARLOTTE TURNER @ 2339 ON 07/28/21 Sandy Salaam Performed at [facility], [address], [place], [place] [address]   Culture, blood (Routine x 2)     Status: None (Preliminary result)   Collection Time: 07/28/21 10:33 PM   Specimen: BLOOD LEFT WRIST  Result Value Ref Range   Specimen Description      BLOOD LEFT WRIST Performed at [facility], [address], [place], [place] [address]    Special Requests      BOTTLES DRAWN AEROBIC AND ANAEROBIC Blood Culture adequate volume Performed at [facility], [address] [place], [place] [address]    Culture  Setup Time      GRAM POSITIVE COCCI IN BOTH AEROBIC AND ANAEROBIC BOTTLES Gram Stain Report Called to,Read Back By and Verified With: [place] @ 1856 ON [id number] BY HENDERSON L CRITICAL VALUE NOTED.  VALUE IS CONSISTENT WITH PREVIOUSLY REPORTED AND CALLED VALUE. Performed at [facility], [place] [address] [place], [place] [address]    Culture GRAM POSITIVE COCCI    Report Status PENDING   Culture, blood (Routine x 2)     Status: None (Preliminary result)   Collection Time: 07/28/21 10:33 PM   Specimen: BLOOD RIGHT HAND  Result Value Ref Range   Specimen Description      BLOOD RIGHT HAND Performed at [facility], [address] [place], [place] [address]    Special Requests      BOTTLES DRAWN AEROBIC AND  ANAEROBIC Blood Culture results may not be optimal due to an excessive volume of blood received in culture bottles Performed at [facility], [address], [place], [place] [address]    Culture  Setup Time      GRAM POSITIVE COCCI IN BOTH AEROBIC AND ANAEROBIC BOTTLES Gram Stain Report Called to,Read Back By and Verified With: CHILDRESS @ 0845 ON [id number] BY HENDERSON L CRITICAL RESULT CALLED TO, READ BACK BY AND VERIFIED WITH: Estelle June [id number] FCP Performed at [facility], [place] [address], [place], [place] [address]    Culture GRAM POSITIVE COCCI    Report Status PENDING   Blood Culture ID Panel (Reflexed)     Status: Abnormal   Collection Time: 07/28/21 10:33 PM  Result Value Ref Range   Enterococcus faecalis NOT DETECTED NOT DETECTED   Enterococcus Faecium NOT DETECTED NOT DETECTED   Listeria monocytogenes NOT DETECTED NOT DETECTED   Staphylococcus species NOT DETECTED NOT DETECTED   Staphylococcus aureus (BCID) NOT DETECTED NOT DETECTED   Staphylococcus epidermidis NOT DETECTED NOT DETECTED   Staphylococcus lugdunensis NOT DETECTED NOT DETECTED   Streptococcus species DETECTED (A) NOT DETECTED    Comment: CRITICAL RESULT CALLED TO, READ BACK BY AND VERIFIED WITH: PHARMD STEVEN H [id number] FCP    Streptococcus agalactiae DETECTED (A) NOT DETECTED    Comment: CRITICAL RESULT CALLED TO, READ BACK BY AND VERIFIED WITH: PHARMD STEVEN H [id number] FCP    Streptococcus pneumoniae NOT DETECTED NOT DETECTED   Streptococcus pyogenes NOT DETECTED NOT DETECTED   A.calcoaceticus-baumannii NOT DETECTED NOT DETECTED   Bacteroides fragilis NOT DETECTED NOT DETECTED   Enterobacterales NOT DETECTED NOT DETECTED   Enterobacter cloacae complex NOT DETECTED NOT DETECTED   Escherichia coli NOT DETECTED NOT DETECTED   Klebsiella aerogenes NOT DETECTED NOT DETECTED   Klebsiella oxytoca NOT DETECTED NOT DETECTED   Klebsiella pneumoniae  NOT DETECTED NOT DETECTED   Proteus species  NOT DETECTED NOT DETECTED   Salmonella species NOT DETECTED NOT DETECTED   Serratia marcescens NOT DETECTED NOT DETECTED   Haemophilus influenzae NOT DETECTED NOT DETECTED   Neisseria meningitidis NOT DETECTED NOT DETECTED   Pseudomonas aeruginosa NOT DETECTED NOT DETECTED   Stenotrophomonas maltophilia NOT DETECTED NOT DETECTED   Candida albicans NOT DETECTED NOT DETECTED   Candida auris NOT DETECTED NOT DETECTED   Candida glabrata NOT DETECTED NOT DETECTED   Candida krusei NOT DETECTED NOT DETECTED   Candida parapsilosis NOT DETECTED NOT DETECTED   Candida tropicalis NOT DETECTED NOT DETECTED   Cryptococcus neoformans/gattii NOT DETECTED NOT DETECTED    Comment: Performed at [facility], [place] [address] [place], [place] [address]  Blood gas, arterial     Status: Abnormal   Collection Time: 07/28/21 10:49 PM  Result Value Ref Range   FIO2 80.00 %   pH, Arterial 7.41 7.35 - 7.45   pCO2 arterial 27 (L) 32 - 48 mmHg   pO2, Arterial 188 (H) 83 - 108 mmHg   Bicarbonate 16.9 (L) 20.0 - 28.0 mmol/L   Acid-base deficit 6.1 (H) 0.0 - 2.0 mmol/L   O2 Saturation 100 %   Patient temperature 37.6    Collection site RIGHT RADIAL    Drawn by [id number]    Allens test (pass/fail) PASS PASS    Comment: Performed at [facility], [address] [place], [place] [address]  Resp Panel by RT-PCR (Flu A&B, Covid) Anterior Nasal Swab     Status: None   Collection Time: 07/28/21 10:51 PM   Specimen: Anterior Nasal Swab  Result Value Ref Range   SARS Coronavirus 2 by RT PCR NEGATIVE NEGATIVE    Comment: (NOTE) SARS-CoV-2 target nucleic acids are NOT DETECTED.  The SARS-CoV-2 RNA is generally detectable in upper respiratory specimens during the acute phase of infection. The lowest concentration of SARS-CoV-2 viral copies this assay can detect is 138 copies/mL. A negative result does not preclude SARS-Cov-2 infection and should not be used as the sole basis for treatment or other patient  management decisions. A negative result may occur with  improper specimen collection/handling, submission of specimen other than nasopharyngeal swab, presence of viral mutation(s) within the areas targeted by this assay, and inadequate number of viral copies(<138 copies/mL). A negative result must be combined with clinical observations, patient history, and epidemiological information. The expected result is Negative.  Fact Sheet for Patients:  EntrepreneurPulse.com.au  Fact Sheet for Healthcare Providers:  IncredibleEmployment.be  This test is no t yet approved or cleared by the [place] FDA and  has been authorized for detection and/or diagnosis of SARS-CoV-2 by FDA under an Emergency Use Authorization (EUA). This EUA will remain  in effect (meaning this test can be used) for the duration of the COVID-19 declaration under Section 564(b)(1) of the Act, 21 U.S.C.section 360bbb-3(b)(1), unless the authorization is terminated  or revoked sooner.       Influenza A by PCR NEGATIVE NEGATIVE   Influenza B by PCR NEGATIVE NEGATIVE    Comment: (NOTE) The Xpert Xpress SARS-CoV-2/FLU/RSV plus assay is intended as an aid in the diagnosis of influenza from Nasopharyngeal swab specimens and should not be used as a sole basis for treatment. Nasal washings and aspirates are unacceptable for Xpert Xpress SARS-CoV-2/FLU/RSV testing.  Fact Sheet for Patients: EntrepreneurPulse.com.au  Fact Sheet for Healthcare Providers: IncredibleEmployment.be  This test is not yet approved or cleared by the  [place] Architectural technologist and has been authorized for detection and/or diagnosis of SARS-CoV-2 by FDA under an Print production planner (EUA). This EUA will remain in effect (meaning this test can be used) for the duration of the COVID-19 declaration under Section 564(b)(1) of the Act, 21 U.S.C. section 360bbb-3(b)(1), unless the  authorization is terminated or revoked.  Performed at [facility], [address], [place], [place] [address]   CBG monitoring, ED     Status: Abnormal   Collection Time: 07/28/21 11:14 PM  Result Value Ref Range   Glucose-Capillary 149 (H) 70 - 99 mg/dL    Comment: Glucose reference range applies only to samples taken after fasting for at least 8 hours.  Lactic acid, plasma     Status: Abnormal   Collection Time: 07/29/21 12:28 AM  Result Value Ref Range   Lactic Acid, Venous 4.6 (HH) 0.5 - 1.9 mmol/L    Comment: CRITICAL RESULT CALLED TO, READ BACK BY AND VERIFIED WITH: C. BELTON AT 0122 ON 06.09.23 BY ADGER J  Performed at [facility], [address] [place], [place] [address]   Troponin I (High Sensitivity)     Status: Abnormal   Collection Time: 07/29/21 12:28 AM  Result Value Ref Range   Troponin I (High Sensitivity) 51 (H) <18 ng/L    Comment: (NOTE) Elevated high sensitivity troponin I (hsTnI) values and significant  changes across serial measurements may suggest ACS but many other  chronic and acute conditions are known to elevate hsTnI results.  Refer to the "Links" section for chest pain algorithms and additional  guidance. Performed at [facility], [address], [place], [place] [address]   Acetaminophen level     Status: Abnormal   Collection Time: 07/29/21 12:28 AM  Result Value Ref Range   Acetaminophen (Tylenol), Serum <10 (L) 10 - 30 ug/mL    Comment: (NOTE) Therapeutic concentrations vary significantly. A range of 10-30 ug/mL  may be an effective concentration for many patients. However, some  are best treated at concentrations outside of this range. Acetaminophen concentrations >150 ug/mL at 4 hours after ingestion  and >50 ug/mL at 12 hours after ingestion are often associated with  toxic reactions.  Performed at [facility], [address], [place], [place] [address]   Hepatitis panel, acute     Status: None   Collection Time: 07/29/21 12:28 AM   Result Value Ref Range   Hepatitis B Surface Ag NON REACTIVE NON REACTIVE   HCV Ab NON REACTIVE NON REACTIVE    Comment: (NOTE) Nonreactive HCV antibody screen is consistent with no HCV infections,  unless recent infection is suspected or other evidence exists to indicate HCV infection.     Hep A IgM NON REACTIVE NON REACTIVE   Hep B C IgM NON REACTIVE NON REACTIVE    Comment: Performed at [facility], [place] [address], [place],  [address]  CBG monitoring, ED     Status: Abnormal   Collection Time: 07/29/21  2:37 AM  Result Value Ref Range   Glucose-Capillary 131 (H) 70 - 99 mg/dL    Comment: Glucose reference range applies only to samples taken after fasting for at least 8 hours.  Lactic acid, plasma     Status: Abnormal   Collection Time: 07/29/21  3:45 AM  Result Value Ref Range   Lactic Acid, Venous 2.8 (HH) 0.5 - 1.9 mmol/L    Comment: CRITICAL VALUE NOTED.  VALUE IS CONSISTENT WITH PREVIOUSLY REPORTED AND CALLED VALUE.  Performed at [facility], [address], [place], [place] [address]   Protime-INR     Status: Abnormal   Collection Time: 07/29/21  3:45 AM  Result Value Ref Range   Prothrombin Time 16.6 (H) 11.4 - 15.2 seconds   INR 1.4 (H) 0.8 - 1.2    Comment: (NOTE) INR goal varies based on device and disease states. Performed at [facility], [address] [place], [place] [address]   Cortisol-am, blood     Status: Abnormal   Collection Time: 07/29/21  3:45 AM  Result Value Ref Range   Cortisol - AM 57.2 (H) 6.7 - 22.6 ug/dL    Comment: RESULTS CONFIRMED BY MANUAL DILUTION Performed at [place] [address] [place], [place] [address]   Procalcitonin     Status: None   Collection Time: 07/29/21  3:45 AM  Result Value Ref Range   Procalcitonin 32.54 ng/mL    Comment:        Interpretation: PCT >= 10 ng/mL: Important systemic inflammatory response, almost exclusively due to severe bacterial sepsis or septic shock. (NOTE)        Sepsis PCT Algorithm           Lower Respiratory Tract                                      Infection PCT Algorithm    ----------------------------     ----------------------------         PCT < 0.25 ng/mL                PCT < 0.10 ng/mL          Strongly encourage             Strongly discourage   discontinuation of antibiotics    initiation of antibiotics    ----------------------------     -----------------------------       PCT 0.25 - 0.50 ng/mL            PCT 0.10 - 0.25 ng/mL               OR       >80% decrease in PCT            Discourage initiation of                                            antibiotics      Encourage discontinuation           of antibiotics    ----------------------------     -----------------------------         PCT >= 0.50 ng/mL              PCT 0.26 - 0.50 ng/mL                AND       <80% decrease in PCT             Encourage initiation of                                             antibiotics       Encourage continuation  of antibiotics    ----------------------------     -----------------------------        PCT >= 0.50 ng/mL                  PCT > 0.50 ng/mL               AND         increase in PCT                  Strongly encourage                                      initiation of antibiotics    Strongly encourage escalation           of antibiotics                                     -----------------------------                                           PCT <= 0.25 ng/mL                                                 OR                                        > 80% decrease in PCT                                      Discontinue / Do not initiate                                             antibiotics  Performed at [facility], [address] [place], [place] [address]   Comprehensive metabolic panel     Status: Abnormal   Collection Time: 07/29/21  3:45 AM  Result Value Ref Range   Sodium 133 (L) 135 - 145 mmol/L    Potassium 4.3 3.5 - 5.1 mmol/L   Chloride 103 98 - 111 mmol/L   CO2 20 (L) 22 - 32 mmol/L   Glucose, Bld 137 (H) 70 - 99 mg/dL    Comment: Glucose reference range applies only to samples taken after fasting for at least 8 hours.   BUN 59 (H) 8 - 23 mg/dL   Creatinine, Ser 2.30 (H) 0.61 - 1.24 mg/dL   Calcium 7.9 (L) 8.9 - 10.3 mg/dL   Total Protein 5.2 (L) 6.5 - 8.1 g/dL   Albumin 2.4 (L) 3.5 - 5.0 g/dL   AST 80 (H) 15 - 41 U/L   ALT 10 0 - 44 U/L   Alkaline Phosphatase 123 38 - 126 U/L   Total Bilirubin 5.2 (H) 0.3 - 1.2 mg/dL   GFR, Estimated 28 (L) >60 mL/min  Comment: (NOTE) Calculated using the CKD-EPI Creatinine Equation (2021)    Anion gap 10 5 - 15    Comment: Performed at [facility], [address] [place], [place] [address]  Magnesium     Status: Abnormal   Collection Time: 07/29/21  3:45 AM  Result Value Ref Range   Magnesium 1.6 (L) 1.7 - 2.4 mg/dL    Comment: Performed at [facility], [address] [place], [place] [address]  CBC with Differential/Platelet     Status: Abnormal   Collection Time: 07/29/21  3:45 AM  Result Value Ref Range   WBC 11.2 (H) 4.0 - 10.5 K/uL   RBC 3.32 (L) 4.22 - 5.81 MIL/uL   Hemoglobin 10.6 (L) 13.0 - 17.0 g/dL   HCT 30.9 (L) 39.0 - 52.0 %   MCV 93.1 80.0 - 100.0 fL   MCH 31.9 26.0 - 34.0 pg   MCHC 34.3 30.0 - 36.0 g/dL   RDW 14.6 11.5 - 15.5 %   Platelets 90 (L) 150 - 400 K/uL    Comment: SPECIMEN CHECKED FOR CLOTS Immature Platelet Fraction may be clinically indicated, consider ordering this additional test [id number] PLATELET COUNT CONFIRMED BY SMEAR    nRBC 0.0 0.0 - 0.2 %   Neutrophils Relative % 92 %   Neutro Abs 10.3 (H) 1.7 - 7.7 K/uL   Lymphocytes Relative 3 %   Lymphs Abs 0.4 (L) 0.7 - 4.0 K/uL   Monocytes Relative 3 %   Monocytes Absolute 0.4 0.1 - 1.0 K/uL   Eosinophils Relative 0 %   Eosinophils Absolute 0.0 0.0 - 0.5 K/uL   Basophils Relative 1 %   Basophils Absolute 0.1 0.0 - 0.1 K/uL   WBC Morphology  MILD LEFT SHIFT (1-5% METAS, OCC MYELO, OCC BANDS)     Comment: VACUOLATED NEUTROPHILS   RBC Morphology MORPHOLOGY UNREMARKABLE    Immature Granulocytes 1 %   Abs Immature Granulocytes 0.15 (H) 0.00 - 0.07 K/uL    Comment: Performed at [facility], [address] [place], [place] [address]  Lipase, blood     Status: None   Collection Time: 07/29/21  3:45 AM  Result Value Ref Range   Lipase 13 11 - 51 U/L    Comment: Performed at [facility], [address], [place], [place] [address]  Lactic acid, plasma     Status: Abnormal   Collection Time: 07/29/21  6:04 AM  Result Value Ref Range   Lactic Acid, Venous 2.0 (HH) 0.5 - 1.9 mmol/L    Comment: CRITICAL VALUE NOTED.  VALUE IS CONSISTENT WITH PREVIOUSLY REPORTED AND CALLED VALUE. Performed at [facility], [address] [place], [place] [address]   TSH     Status: None   Collection Time: 07/29/21  6:04 AM  Result Value Ref Range   TSH 2.436 0.350 - 4.500 uIU/mL    Comment: Performed by a 3rd Generation assay with a functional sensitivity of <=0.01 uIU/mL. Performed at [facility], [address], [place], [place] [address]   Vitamin B12     Status: Abnormal   Collection Time: 07/29/21  6:04 AM  Result Value Ref Range   Vitamin B-12 1,036 (H) 180 - 914 pg/mL    Comment: (NOTE) This assay is not validated for testing neonatal or myeloproliferative syndrome specimens for Vitamin B12 levels. Performed at [facility], [address] [place], [place] [address]   Urinalysis, Routine w reflex microscopic Urine, Clean Catch     Status: Abnormal   Collection  Time: 07/29/21 10:00 AM  Result Value Ref Range   Color, Urine AMBER (A) YELLOW    Comment: BIOCHEMICALS MAY BE AFFECTED BY COLOR   APPearance CLOUDY (A) CLEAR   Specific Gravity, Urine 1.023 1.005 - 1.030   pH 5.0 5.0 - 8.0   Glucose, UA 50 (A) NEGATIVE mg/dL   Hgb urine dipstick SMALL (A) NEGATIVE   Bilirubin Urine NEGATIVE NEGATIVE   Ketones, ur 5 (A) NEGATIVE mg/dL    Protein, ur 30 (A) NEGATIVE mg/dL   Nitrite NEGATIVE NEGATIVE   Leukocytes,Ua NEGATIVE NEGATIVE   RBC / HPF 0-5 0 - 5 RBC/hpf   WBC, UA 11-20 0 - 5 WBC/hpf   Bacteria, UA RARE (A) NONE SEEN   Squamous Epithelial / LPF 0-5 0 - 5   WBC Clumps PRESENT    Mucus PRESENT    Budding Yeast PRESENT    Hyaline Casts, UA PRESENT    Uric Acid Crys, UA PRESENT     Comment: Performed at [facility], [address], [place], [place] [address]  MRSA Next Gen by PCR, Nasal     Status: None   Collection Time: 07/29/21 10:27 AM   Specimen: Nasal Mucosa; Nasal Swab  Result Value Ref Range   MRSA by PCR Next Gen NOT DETECTED NOT DETECTED    Comment: (NOTE) The GeneXpert MRSA Assay (FDA approved for NASAL specimens only), is one component of a comprehensive MRSA colonization surveillance program. It is not intended to diagnose MRSA infection nor to guide or monitor treatment for MRSA infections. Test performance is not FDA approved in patients less than 29 years old. Performed at [facility], [address] [place], [place] [address]   Glucose, capillary     Status: Abnormal   Collection Time: 07/29/21 11:19 AM  Result Value Ref Range   Glucose-Capillary 124 (H) 70 - 99 mg/dL    Comment: Glucose reference range applies only to samples taken after fasting for at least 8 hours.  Blood gas, arterial     Status: Abnormal   Collection Time: 07/29/21 11:40 AM  Result Value Ref Range   FIO2 60.00 %   pH, Arterial 7.42 7.35 - 7.45   pCO2 arterial 29 (L) 32 - 48 mmHg   pO2, Arterial 180 (H) 83 - 108 mmHg   Bicarbonate 19.0 (L) 20.0 - 28.0 mmol/L   Acid-base deficit 4.5 (H) 0.0 - 2.0 mmol/L   O2 Saturation 100 %   Patient temperature 36.5    Collection site RIGHT RADIAL    Drawn by [id number]    Allens test (pass/fail) PASS PASS    Comment: Performed at [facility], [address] [place], [place] [address]  Glucose, capillary     Status: Abnormal   Collection Time: 07/29/21  5:56 PM  Result Value Ref  Range   Glucose-Capillary 145 (H) 70 - 99 mg/dL    Comment: Glucose reference range applies only to samples taken after fasting for at least 8 hours.  Glucose, capillary     Status: Abnormal   Collection Time: 07/29/21  9:01 PM  Result Value Ref Range   Glucose-Capillary 140 (H) 70 - 99 mg/dL    Comment: Glucose reference range applies only to samples taken after fasting for at least 8 hours.   Personally reviewed all imaging, took patient down to MRCP to ensure it occurred- dilated CBD on the [place] and some pneumobilia on the Cts, no dilation on the MRCP but limited due to motion  MR  ABDOMEN MRCP WO CONTRAST  Result Date: 07/29/2021 CLINICAL DATA:  Upper abdominal pain. Dilated common bile duct on ultrasound. EXAM: MRI ABDOMEN WITHOUT CONTRAST  (INCLUDING MRCP) TECHNIQUE: Multiplanar multisequence MR imaging of the abdomen was performed. Heavily T2-weighted images of the biliary and pancreatic ducts were obtained, and three-dimensional MRCP images were rendered by post processing. COMPARISON:  Ultrasound 07/29/2021 FINDINGS: Motion degradation exam. ICU patient. ICU patients have difficulty following breath holding commands. Lower chest:  Small bilateral pleural effusions Hepatobiliary: No intrahepatic biliary duct dilatation. Common bile duct normal caliber. The no gallstones identified. Small amount sludge within the gallbladder. No gallbladder distension or inflammation. Pancreas: Normal pancreatic parenchymal intensity. No ductal dilatation or inflammation. Spleen: Normal spleen. Adrenals/urinary tract: Adrenal glands and kidneys are normal. Stomach/Bowel: Stomach and limited of the small bowel is unremarkable Vascular/Lymphatic: Abdominal aortic normal caliber. No retroperitoneal periportal lymphadenopathy. Musculoskeletal: No aggressive osseous lesion IMPRESSION: 1. Normal common bile duct. No intrahepatic biliary duct dilatation. 2. Small amount gallbladder sludge.  No evidence of cholecystitis. 3.  Normal pancreas. 4. Bibasilar atelectasis. 5. Imaging degraded by respiratory motion. Electronically Signed   By: Suzy Bouchard M.D.   On: 07/29/2021 19:59   MR 3D Recon At Scanner  Result Date: 07/29/2021 CLINICAL DATA:  Upper abdominal pain. Dilated common bile duct on ultrasound. EXAM: MRI ABDOMEN WITHOUT CONTRAST  (INCLUDING MRCP) TECHNIQUE: Multiplanar multisequence MR imaging of the abdomen was performed. Heavily T2-weighted images of the biliary and pancreatic ducts were obtained, and three-dimensional MRCP images were rendered by post processing. COMPARISON:  Ultrasound 07/29/2021 FINDINGS: Motion degradation exam. ICU patient. ICU patients have difficulty following breath holding commands. Lower chest:  Small bilateral pleural effusions Hepatobiliary: No intrahepatic biliary duct dilatation. Common bile duct normal caliber. The no gallstones identified. Small amount sludge within the gallbladder. No gallbladder distension or inflammation. Pancreas: Normal pancreatic parenchymal intensity. No ductal dilatation or inflammation. Spleen: Normal spleen. Adrenals/urinary tract: Adrenal glands and kidneys are normal. Stomach/Bowel: Stomach and limited of the small bowel is unremarkable Vascular/Lymphatic: Abdominal aortic normal caliber. No retroperitoneal periportal lymphadenopathy. Musculoskeletal: No aggressive osseous lesion IMPRESSION: 1. Normal common bile duct. No intrahepatic biliary duct dilatation. 2. Small amount gallbladder sludge.  No evidence of cholecystitis. 3. Normal pancreas. 4. Bibasilar atelectasis. 5. Imaging degraded by respiratory motion. Electronically Signed   By: Suzy Bouchard M.D.   On: 07/29/2021 19:59   DG CHEST PORT 1 VIEW  Result Date: 07/29/2021 CLINICAL DATA:  Shortness of breath.  Chest pain.  Diaphoresis. EXAM: PORTABLE CHEST 1 VIEW COMPARISON:  07/29/2021 FINDINGS: Low lung volumes are present, causing crowding of the pulmonary vasculature. There is likely mild  subsegmental atelectasis along both hemidiaphragms. Mild cardiomegaly. Prominence of the right upper mediastinal margin, cannot exclude adenopathy or nodule. IMPRESSION: 1. Low lung volumes and suspected mild atelectasis at the lung bases. 2. Somewhat nodular prominence the right upper mediastinum, cannot exclude adenopathy or nodule although this might possibly be from tortuous vasculature. Chest CT could be utilized for further characterization. 3. Mild cardiomegaly. Electronically Signed   By: Van Clines M.D.   On: 07/29/2021 10:05   US Abdomen Limited RUQ (LIVER/GB)  Result Date: 07/29/2021 CLINICAL DATA:  Abnormal LFTs EXAM: ULTRASOUND ABDOMEN LIMITED RIGHT UPPER QUADRANT COMPARISON:  CT abdomen and pelvis 07/28/2021 FINDINGS: Gallbladder: Moderately distended and contains echogenic calculi and sludge dependently. 5 mm echogenic likely polyp at the fundus. No significant wall thickening or pericholecystic fluid identified. Common bile duct: Diameter: 8 mm, upper normal for the patient's age.  Liver: No focal lesion identified. Within normal limits in parenchymal echogenicity. Portal vein is patent on color Doppler imaging with normal direction of blood flow towards the liver. Other: None. IMPRESSION: 1. Cholelithiasis and sludge. 2. 5 mm likely polyp in the gallbladder. 3. Upper normal caliber of the common bile duct and moderate distention of the gallbladder. Correlate clinically and consider MRCP if indicated. Electronically Signed   By: Ofilia Neas M.D.   On: 07/29/2021 09:56   DG CHEST PORT 1 VIEW  Result Date: 07/29/2021 CLINICAL DATA:  Wheezing EXAM: PORTABLE CHEST 1 VIEW COMPARISON:  07/28/2021 FINDINGS: Limited low volume chest with generalized interstitial coarsening/crowding. No Kerley lines or pleural effusion. Normal heart size and mediastinal contours. Extensive artifact from EKG leads IMPRESSION: Limited low volume chest that is stable from yesterday Electronically Signed   By:  Jorje Guild M.D.   On: 07/29/2021 06:48   DG Wrist Complete Left  Result Date: 07/29/2021 CLINICAL DATA:  Wrist pain EXAM: LEFT WRIST - COMPLETE 3+ VIEW COMPARISON:  None Available. FINDINGS: No fracture or malalignment. Vascular calcifications. Advanced joint space narrowing of the radiocarpal joint. No erosions. IMPRESSION: 1. 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 2. No acute osseous abnormality Electronically Signed   By: Donavan Foil M.D.   On: 07/29/2021 00:09   CT ABDOMEN PELVIS WO CONTRAST  Result Date: 07/28/2021 CLINICAL DATA:  Abdominal pain EXAM: CT ABDOMEN AND PELVIS WITHOUT CONTRAST TECHNIQUE: Multidetector CT imaging of the abdomen and pelvis was performed following the standard protocol without IV contrast. RADIATION DOSE REDUCTION: This exam was performed according to the departmental dose-optimization program which includes automated exposure control, adjustment of the mA and/or kV according to patient size and/or use of iterative reconstruction technique. COMPARISON:  07/26/2021 FINDINGS: Lower chest: Small pleural effusions are noted bilaterally slightly increased when compared with the prior exam. Mild bibasilar atelectasis is seen. Hepatobiliary: Liver is within normal limits. Dependent gallstones are noted within the gallbladder which is well distended. Pneumobilia is again identified consistent with the given clinical history of sphincterotomy. Pancreas: Unremarkable. No pancreatic ductal dilatation or surrounding inflammatory changes. Spleen: Normal in size without focal abnormality. Adrenals/Urinary Tract: Adrenal glands are within normal limits bilaterally. Kidneys show no tiny nonobstructing renal stone on the right. No definitive stones are noted on the left. Hypodense lesion is noted within the left kidney measuring approximately 1.5 cm consistent with small cysts stable in appearance from the prior exam. No obstructive changes are seen. The bladder is well distended with mildly  opacified urine. A portion of the bladder extends into a large right sided inguinal hernia. This is stable in appearance from the prior exam. Stomach/Bowel: Scattered fecal material is noted throughout the colon. Mild diverticular change is seen. The appendix has been surgically removed consistent with the given clinical history. Small bowel and stomach are unremarkable. Vascular/Lymphatic: Aortic atherosclerosis. No enlarged abdominal or pelvic lymph nodes. Reproductive: Prostate is unremarkable. Other: No ascites is noted. Bilateral fat containing inguinal hernias are seen. Persistent herniated bladder into the right hernia is noted. Musculoskeletal: Degenerative changes of lumbar spine are seen. IMPRESSION: Small nonobstructing right renal stone. Bilateral inguinal hernias with herniated bladder on the right stable from the prior study. Multiple small gallstones without complicating factors. No other focal abnormality is noted. Electronically Signed   By: Inez Catalina M.D.   On: 07/28/2021 23:58   DG Chest Port 1 View  Result Date: 07/28/2021 CLINICAL DATA:  Respiratory distress EXAM: PORTABLE CHEST 1 VIEW COMPARISON:  07/26/2021  FINDINGS: Hypoventilatory changes. No consolidation or effusion. Stable cardiomediastinal silhouette. No pneumothorax IMPRESSION: No active disease.  Low lung volumes. Electronically Signed   By: Donavan Foil M.D.   On: 07/28/2021 22:42     Assessment & Plan:  EUGENE ISADORE is a 81 y.o. male with bacteremia with strep of unknown origin, elevated bilirubin but no signs of choledocholithiasis or dilated CBD on MRCP. 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     Explained to wife we were getting the MRCP to see if that gave [place] a better idea but unfortunately it does not. She wants to pursue whatever is possible.  Updated Dr. Laural Golden that I am  worried about choledocholithiasis, MRCP negative?  He could have a stricture from all the prior manipulation?   Continue antibiotics. Trend labs, I ordered hepatic panel, lipase, CBC  No indication at this time for cholecystectomy   All questions were answered to the satisfaction the patient's wife.   Greater than 50% of the 80 minute visit was spent in counseling/ coordination of care regarding this patient's history, my concerns about choledocholithiasis or biliary process/ stricture, talking to the team, Dr. Dyann Kief and Dr. Laural Golden and helping to get the patient his MRCP, and discussing everything with his wife.    Virl Cagey 07/29/2021, 9:05 PM

## 2021-07-29 NOTE — Sepsis Progress Note (Signed)
Monitoring for the code sepsis protocol. °

## 2021-07-29 NOTE — Assessment & Plan Note (Addendum)
-  pt on Sinemet when able to take by mouth, but poor mentation precludes po intake initially -Patient with underlying mood disorder and mild hallucinations/psychosis as per patient's wife reports>>>worse over past 6-12 months at baseline -Unable to place NG tube on 07/30/21 (very traumatic and never able to passed from his neck). -now more awake -speech eval>>dys 2 with thin

## 2021-07-29 NOTE — Assessment & Plan Note (Addendum)
-  initially held due to poor mentation and elevated LFTs -Holding statin for now.

## 2021-07-29 NOTE — ED Notes (Addendum)
Pharmacy notified re: need for Eldepryl to be brought to the unit, per pharmacy medication coming from [facility] location and ETA at 10:30am

## 2021-07-29 NOTE — ED Notes (Addendum)
Dyann Kief, MD to see the pt upon arrival to ICU

## 2021-07-29 NOTE — Assessment & Plan Note (Addendum)
-  Baseline>>pleasant confusion with hallucinations -Patient with underlying history of mood disorder/dementia and Parkinson. -Continue to be lethargic--but slowly improving -6/11 CT head--neg -ammonia = 41>>22 -6/11 ABG 7.45/32/77/22 on 2L -Palliative care consulted. -remains encephalopathic on 6/18, but more awake and occasionally follows commands -d/c hydromorphone>>>start norco 08/08/21--mental status took down turn again; awake and occasionally speaks ---VBG 7.07/15/69/20 ---[id number] ---6/19 UA no significant pyuria

## 2021-07-29 NOTE — Progress Notes (Signed)
Notified Dr Josephine Cables patient is unable to take oral medication due to poor alertness, unable to follow commands, and is on Bipap. Patient having increased parkinson's tremors and electronic BP cuff is not reading well. Manual BP readings will be taken as needed current BP is 166/70. Patient has moaning when nurse attempts to move patient. Tylenol suppository given for comfort. Order received to continue monitoring.  Erling Conte, RN

## 2021-07-29 NOTE — ED Notes (Signed)
Pt has auditory wheezes on exhalation, RT notified to come look at the pt

## 2021-07-29 NOTE — Progress Notes (Signed)
Pharmacy Antibiotic Note  Elijah [name] is a 81 y.o. male admitted on 07/28/2021 with sepsis.  Pharmacy has been consulted for cefepime dosing. Suspected abdominal source; WBC 11.4, afebrile, LA 6.3, RR 37  Plan: Cefepime 2G IV q24 (CrCl ~30.7 mL/min)  Weight: 90 kg (198 lb 6.6 oz)  Temp (24hrs), Avg:99.3 F (37.4 C), Min:98.9 F (37.2 C), Max:99.7 F (37.6 C)  Recent Labs  Lab 07/26/21 0745 07/28/21 2231 07/28/21 2233  WBC 17.4* 11.4*  --   CREATININE 1.59* 2.24*  --   LATICACIDVEN  --   --  6.3*    Estimated Creatinine Clearance: 30.7 mL/min (A) (by C-G formula based on SCr of 2.24 mg/dL (H)).    Allergies  Allergen Reactions   Aspirin Other (See Comments)    Stomach ulcers.   Coconut (Cocos Nucifera)     Other reaction(s): GI Upset (intolerance)   Penicillins Rash    Has patient had a PCN reaction causing immediate rash, facial/tongue/throat swelling, SOB or lightheadedness with hypotension: no Has patient had a PCN reaction causing severe rash involving mucus membranes or skin necrosis: No Has patient had a PCN reaction that required hospitalization No Has patient had a PCN reaction occurring within the last 10 years: No If all of the above answers are "NO", then may proceed with Cephalosporin use.     Antimicrobials this admission: Cefepime 6/9>> Flagyl 6/9>>  Thank you for allowing pharmacy to be a part of this patient's care.  Jodean Lima Kateland Leisinger 07/29/2021 1:03 AM

## 2021-07-29 NOTE — H&P (Signed)
History and Physical    Patient: Elijah [name] PFX:[id number] DOB: 09-Mar-1940 DOA: 07/28/2021 DOS: the patient was seen and examined on 07/29/2021 PCP: Celene Squibb, MD  Patient coming from: Home  Chief Complaint:  Chief Complaint  Patient presents with   Shortness of Breath   HPI: Elijah [name] is a 81 y.o. male with medical history significant of history of chronic low back pain, diabetes mellitus type 2, hyperlipidemia, hypertension, Parkinson disease, obstructive sleep apnea, history of biliary obstruction and pancreatitis with ERCP in 2018, presents ED with chief complaint of dyspnea.  At time of presentation wife was at bedside.  She reported to the ED provider that patient has had 24 hours of shortness of breath and increased work of breathing.  He was recently seen in the ED 2 days ago for back pain, which was acute on chronic.  They had denied fevers at home, but wife did report the patient had left arm pain when she was trying to lift him up.  ED provider note reports that patient denied current abdominal pain.  Patient is not answering questions for me, but does have voluntary guarding with palpation of his abdomen.  Patient arrived in the ER satting in the low 90s on 4 L nasal cannula.  He was put on nonrebreather for work of breathing.  Sepsis protocol was started.  Patient was treated with vancomycin, cefepime, Flagyl, and given a 3 L bolus.  Lactic acid was significantly elevated 6.3, repeat 4.6.  Chest x-ray was negative for acute disease, CT abdomen pelvis did not given definitive explanation of his symptoms either.  Urine was borderline.  Patient did have an AKI.  Admission was requested for further work-up of altered mental status, dyspnea, and most likely sepsis. Review of Systems: unable to review all systems due to the inability of the patient to answer questions. Past Medical History:  Diagnosis Date   Chronic low back pain 03/24/2015   Degenerative arthritis    Diabetes  mellitus without complication ([place])    Dyslipidemia    Glaucoma    Gout 10/07/2012   Hypertension    Low back pain    Obesity    OSA (obstructive sleep apnea) 04/10/2014   cannot tolerate, PCP aware.   Pancreatitis    Parkinson disease ([place])    Rotator cuff tear    Bilateral, no surgery   Past Surgical History:  Procedure Laterality Date   AMPUTATION Right 01/31/2018   Procedure: AMPUTATION RIGHT THIRD TOE;  Surgeon: Caprice Beaver, DPM;  Location: [facility];  Service: Podiatry;  Laterality: Right;   APPENDECTOMY     BALLOON DILATION N/A 09/06/2015   Procedure: BALLOON DILATION;  Surgeon: Daneil Dolin, MD;  Location: [facility];  Service: Endoscopy;  Laterality: N/A;   BILIARY STENT PLACEMENT N/A 09/08/2015   Procedure: BILIARY STENT PLACEMENT;  Surgeon: Daneil Dolin, MD;  Location: [facility];  Service: Endoscopy;  Laterality: N/A;   ERCP N/A 09/06/2015   Procedure: ENDOSCOPIC RETROGRADE CHOLANGIOPANCREATOGRAPHY (ERCP);  Surgeon: Daneil Dolin, MD;  Location: [facility];  Service: Endoscopy;  Laterality: N/A;   ERCP N/A 09/08/2015   Procedure: ENDOSCOPIC RETROGRADE CHOLANGIOPANCREATOGRAPHY (ERCP);  Surgeon: Daneil Dolin, MD;  Location: [facility];  Service: Endoscopy;  Laterality: N/A;  with bile duct brushings   HERNIA REPAIR     umbilical   SHOULDER BONE SPUR Left    RESECTION   SPHINCTEROTOMY N/A 09/06/2015   Procedure: SPHINCTEROTOMY;  Surgeon:  Daneil Dolin, MD;  Location: [facility];  Service: Endoscopy;  Laterality: N/A;   TONSILLECTOMY     Social History:  reports that he has never smoked. He has never used smokeless tobacco. He reports that he does not drink alcohol and does not use drugs.  Allergies  Allergen Reactions   Aspirin Other (See Comments)    Stomach ulcers.   Coconut (Cocos Nucifera)     Other reaction(s): GI Upset (intolerance)   Penicillins Rash    Has patient had a PCN reaction causing immediate rash, facial/tongue/throat swelling,  SOB or lightheadedness with hypotension: no Has patient had a PCN reaction causing severe rash involving mucus membranes or skin necrosis: No Has patient had a PCN reaction that required hospitalization No Has patient had a PCN reaction occurring within the last 10 years: No If all of the above answers are "NO", then may proceed with Cephalosporin use.     Family History  Problem Relation Age of Onset   Stroke Mother    Diabetes Mother    Heart Problems Mother    Parkinsonism Father    Heart Problems Father     Prior to Admission medications   Medication Sig Start Date End Date Taking? Authorizing Provider  aspirin 81 MG tablet Take 81 mg by mouth daily.    [provider]  Azelastine HCl 0.15 % SOLN Place 1 spray into both nostrils daily as needed (congestion).  08/18/15   [provider]  Carbidopa-Levodopa ER (SINEMET CR) 25-100 MG tablet controlled release TAKE TWO TABLETS BY MOUTH EVERY MORNING, TWO TABLETS AT LUNCH, ONE TABLET AT dinner, AND two TABLET AT BEDTIME 06/27/21   Ward Givens, NP  cyclobenzaprine (FLEXERIL) 10 MG tablet Take 10 mg by mouth as needed for muscle spasms.    [provider]  escitalopram (LEXAPRO) 10 MG tablet Take 5 mg by mouth daily. 07/30/19   [provider]  furosemide (LASIX) 20 MG tablet Take 20 mg by mouth as needed for fluid.    [provider]  LORazepam (ATIVAN) 1 MG tablet Take 1 mg by mouth at bedtime. 07/11/21   [provider]  meloxicam (MOBIC) 15 MG tablet Take 15 mg by mouth as needed for pain.    [provider]  metFORMIN (GLUCOPHAGE) 500 MG tablet Take 500 mg by mouth daily with breakfast.  10/05/12   [provider]  Methyl Salicylate-Lido-Menthol 4-4-5 % PTCH Apply 1 patch topically in the morning and at bedtime. These patches may be available over-the-counter as an alternative to the prescription strength.  Substitute as necessary. 07/26/21   Carmin Muskrat, MD   metoprolol tartrate (LOPRESSOR) 25 MG tablet Take 12.5 mg by mouth 2 (two) times daily.    [provider]  OLANZapine (ZYPREXA) 10 MG tablet Take 1 tablet (10 mg total) by mouth at bedtime. 06/16/21   [name], NP  pantoprazole (PROTONIX) 40 MG tablet Take 40 mg by mouth daily. 03/08/14   [provider]  potassium chloride SA (K-DUR,KLOR-CON) 20 MEQ tablet Take 20 mEq by mouth as needed (cramps). 10/24/13   [provider]  pramipexole (MIRAPEX) 0.75 MG tablet TAKE ONE TABLET BY MOUTH THREE TIMES DAILY 01/19/20   Kathrynn Ducking, MD  QUEtiapine (SEROQUEL) 25 MG tablet TAKE ONE TABLET BY MOUTH AT 10am, 4pm, AND 2 tablets at 9pm daily AS DIRECTED 07/06/21   [name], NP  selegiline (ELDEPRYL) 5 MG tablet Take 5 mg by mouth 2 (  two) times daily with a meal.    [provider]  simvastatin (ZOCOR) 40 MG tablet Take 20 mg by mouth at bedtime.  09/05/12   [provider]  valsartan-hydrochlorothiazide (DIOVAN HCT) 160-12.5 MG tablet Take 1 tablet by mouth daily. 07/28/20   Kathrynn Ducking, MD    Physical Exam: Vitals:   07/29/21 0039 07/29/21 0145 07/29/21 0200 07/29/21 0204  BP: 123/86 110/89 115/72   Pulse: 100 (!) 104 (!) 101   Resp: (!) 33 (!) 33 (!) 34   Temp:    98.6 F (37 C)  TempSrc:    Rectal  SpO2: 94% 95% 94%   Weight:       1.  General: Patient lying supine in bed, ill-appearing   2. Psychiatric: Nonverbal, not following commands  3. Neurologic: Patient is currently nonverbal, face is symmetric, withdraws from pain in all 4 extremities, tremors in the upper extremities bilaterally, altered without focal deficits on exam   4. HEENMT:  Head is atraumatic, normocephalic, pupils reactive to light, neck is supple, trachea is midline, mucous membranes are mildly dry   5. Respiratory : Wheezing in the right lung field, clear on the left, tachypneic with use of accessory muscles, currently on high flow, no cyanosis   6.  Cardiovascular : Heart rate normal, rhythm is regular, no murmurs, rubs or gallops, venous stasis changes of the lower extremity greater on right than left, peripheral pulses palpated   7. Gastrointestinal:  Abdomen is soft, guarding with palpation of the epigastric and right upper quadrant regions, bowel sounds active, no masses or organomegaly palpated   8. Skin:  Skin is warm, dry and intact without rashes, acute lesions, or ulcers on limited exam   9.Musculoskeletal:  Third digit right foot amputation, no acute deformities or trauma, no asymmetry in tone, no peripheral edema, peripheral pulses palpated, no tenderness to palpation in the extremities  Data Reviewed: In the ED  Temp 98.9, heart rate 90-100, respiratory rate 30-44, blood pressure 109/63, satting at 93% on high flow nasal cannula COVID and flu negative CT abdomen pelvis shows bilateral inguinal hernias, herniated bladder on the right, small nonobstructing stone, gallstones without complication EKG shows a heart rate of 94, sinus rhythm, QTc 467 Blood cultures pending Chest x-ray shows no acute findings Mild leukocytosis 11.4, hemoglobin 12.3, platelets 107 Chemistry panel shows elevated BUN at 57, elevated creatinine 2.24 Decreased albumin 2.9 Increased T. bili 5.4 Trope 52, 51 VBG shows a pH of 7.4, PCO2 27, PO2 188 Patient was started on vancomycin, cefepime, Flagyl and given a 3 L bolus Admission was requested for sepsis secondary to intra-abdominal versus pneumonia source Assessment and Plan: * AKI (acute kidney injury) (HCC) - Creatinine baseline 0.6, today 2.24 -Decreased bicarb -Secondary to sepsis -Continue IV fluids -Trend in the a.m. -Hold nephrotoxic agents when possible  Acute metabolic encephalopathy - Baseline is unclear, and history was taken mostly by wife by ED provider -Patient is not following commands, or responding at all at this time -He was noted to be a poor historian in 2021, but he  was answering questions at that time to the so we can assume that he is altered -Most likely secondary to metabolic acidosis, sepsis -Treatment as above -Nonfocal exam -Continue to monitor  HLD (hyperlipidemia) - Continue statin -Patient does have an elevated T. bili, his transaminases are at baseline -If he develops transaminitis, will hold the statin  Lactic acidosis - Secondary to sepsis -Initial lactic acid 6.3, repeat  4.6 -Continue IV hydration, trend again in 2 hours -Respiratory compensation breathing 30-44 breaths/min, the pH is 7.4 with PCO2 of 27 -Continue to monitor  Acute respiratory failure with hypoxia (HCC) - Patient was satting low 90s on 4 L at presentation -He was increased to nonrebreather for work of breathing -Patient continues to be tachypneic on nonrebreather -Serum for pneumonia on the right, and patient is septic -Continue treatment for sepsis -Troponins are stable at 52, 51-demand in the setting of sepsis -Continue to monitor  Sepsis ([place]) - Heart rate 100, respiratory rate 30-44, lactic acidosis 6.3, AKI 2.24 -Possible intra-abdominal infection versus pneumonia -Patient initially started on Vanco, Flagyl, cefepime, continuing cefepime and Flagyl at this time -Imaging does not reveal a source of infection -COVID and flu are negative -Blood cultures pending -Urine has many bacteria but only 0-5 white blood cells, urine culture pending -Procalcitonin pending -Monitor on stepdown  Total bilirubin, elevated - Bili up to 5.4 from 2.5 -Patient does have history of biliary obstruction and ERCP back in 2018 -CT scan shows bilateral inguinal hernias, herniated bladder on the right, small nonobstructing stone, and gallstones without complication -Patient started on vanc, cefepime, Flagyl which would cover any intra-abdominal infection -Right upper quadrant ultrasound ordered for the a.m. -General surgery consulted for recommendations regarding possible  HIDA? -Trend in the a.m.  Essential hypertension - Holding ARB, HCTZ for AKI -Patient's BP is also soft at 109/63 -Continue to monitor  Diabetes mellitus without complication (HCC) - Holding metformin -Patient currently n.p.o. -We will monitor glucose with CBGs and if it starts trending up we will add insulin  Parkinson disease ([place]) - Continue patient's Sinemet -Interestingly patient is also on Zyprexa at home -Continue to monitor      Advance Care Planning:   Code Status: Prior FULL  Consults: None  Family Communication: No family at bedside  Severity of Illness: The appropriate patient status for this patient is INPATIENT. Inpatient status is judged to be reasonable and necessary in order to provide the required intensity of service to ensure the patient's safety. The patient's presenting symptoms, physical exam findings, and initial radiographic and laboratory data in the context of their chronic comorbidities is felt to place them at high risk for further clinical deterioration. Furthermore, it is not anticipated that the patient will be medically stable for discharge from the hospital within 2 midnights of admission.   * I certify that at the point of admission it is my clinical judgment that the patient will require inpatient hospital care spanning beyond 2 midnights from the point of admission due to high intensity of service, high risk for further deterioration and high frequency of surveillance required.*  Author: Rolla Plate, DO 07/29/2021 2:27 AM  For on call review [email or web address].

## 2021-07-29 NOTE — ED Notes (Signed)
Pt repositioned in bed, RR 37, RT notified

## 2021-07-29 NOTE — Progress Notes (Addendum)
RN called to assess pt due to increased SOB and pt wheezing.   PT given a Albuterol neb tx due to distress and wheezes.  MD notified and ordered Chest xray as well. HR, 97, rr 25, sats 96% on 3L [place].   Will continue to monitor.

## 2021-07-29 NOTE — ED Notes (Signed)
Pt remains diaphoretic, tachypneic and now tachycardiac, MD notified and requested to reassess the pt

## 2021-07-29 NOTE — ED Notes (Signed)
ED Provider at bedside. 

## 2021-07-29 NOTE — Progress Notes (Signed)
SLP Cancellation Note  Patient Details Name: Elijah [name] MRN: [id number] DOB: [birth year]   Cancelled treatment:       Reason Eval/Treat Not Completed: Patient not medically ready;Medical issues which prohibited therapy. Pt currently on BiPAP and not appropriate for BSE. ST will continue efforts. Thank you,  Matthew Pais H. Roddie Mc, CCC-SLP Speech Language Pathologist    Wende Bushy 07/29/2021, 1:29 PM

## 2021-07-29 NOTE — Assessment & Plan Note (Addendum)
-  history of biliary obstruction and ERCP back in 2018 -CT abd--bilateral inguinal hernias, herniated bladder on the right, small nonobstructing stone, and gallstones without complication -MRCP--no retained CBD stones or any masses in his liver. -LFTs has continued trending down overall. -Continue IV antibiotics -After discussing with general surgery>>check HIDA scan; which demonstrated positive result for acute cholecystitis and cystic duct obstruction. -not a candidate for cholecystectomy/surgical intervention. -6/13--cholecystotomy tube>>bili improved

## 2021-07-29 NOTE — Assessment & Plan Note (Addendum)
-  holding metformin -monitor glucose with CBGs and if it starts trending up will add insulin coverage. -so far CBG's are stable. -6/16 A1C--6.7

## 2021-07-29 NOTE — ED Notes (Signed)
MD notified re: pts respiratory effort and RR, no change in presentation after the neb treatment

## 2021-07-29 NOTE — ED Notes (Signed)
Dyann Kief, MD at bedside

## 2021-07-29 NOTE — ED Provider Notes (Signed)
12:22 AM Assumed care from Dr. Regenia Skeeter, please see their note for full history, physical and decision making until this point. In brief this is a 81 y.o. year old male who presented to the ED tonight with Shortness of Breath     Here with tachypnea and back pain. Sick. Possibly gall bladder. Possibly liver source. Discussed with medicine for admission.   Labs, studies and imaging reviewed by myself and considered in medical decision making if ordered. Imaging interpreted by radiology.  Labs Reviewed  COMPREHENSIVE METABOLIC PANEL - Abnormal; Notable for the following components:      Result Value   Sodium 130 (*)    CO2 16 (*)    Glucose, Bld 151 (*)    BUN 57 (*)    Creatinine, Ser 2.24 (*)    Calcium 8.3 (*)    Total Protein 6.1 (*)    Albumin 2.9 (*)    AST 83 (*)    Alkaline Phosphatase 156 (*)    Total Bilirubin 5.4 (*)    GFR, Estimated 29 (*)    All other components within normal limits  LACTIC ACID, PLASMA - Abnormal; Notable for the following components:   Lactic Acid, Venous 6.3 (*)    All other components within normal limits  CBC WITH DIFFERENTIAL/PLATELET - Abnormal; Notable for the following components:   WBC 11.4 (*)    RBC 3.84 (*)    Hemoglobin 12.3 (*)    HCT 35.3 (*)    Platelets 107 (*)    Neutro Abs 9.2 (*)    Lymphs Abs 0.6 (*)    Monocytes Absolute 0.0 (*)    Abs Immature Granulocytes 1.60 (*)    All other components within normal limits  BLOOD GAS, ARTERIAL - Abnormal; Notable for the following components:   pCO2 arterial 27 (*)    pO2, Arterial 188 (*)    Bicarbonate 16.9 (*)    Acid-base deficit 6.1 (*)    All other components within normal limits  CBG MONITORING, ED - Abnormal; Notable for the following components:   Glucose-Capillary 149 (*)    All other components within normal limits  TROPONIN I (HIGH SENSITIVITY) - Abnormal; Notable for the following components:   Troponin I (High Sensitivity) 52 (*)    All other components within normal  limits  CULTURE, BLOOD (ROUTINE X 2)  CULTURE, BLOOD (ROUTINE X 2)  RESP PANEL BY RT-PCR (FLU A&B, COVID) ARPGX2  PROTIME-INR  LACTIC ACID, PLASMA  URINALYSIS, ROUTINE W REFLEX MICROSCOPIC  LIPASE, BLOOD  ACETAMINOPHEN LEVEL  HEPATITIS PANEL, ACUTE  TROPONIN I (HIGH SENSITIVITY)    CT ABDOMEN PELVIS WO CONTRAST  Final Result    DG Chest Port 1 View  Final Result    DG Wrist Complete Left    (Results Pending)    No follow-ups on file.    Alliya Marcon, Corene Cornea, MD 07/29/21 [phone]

## 2021-07-29 NOTE — Assessment & Plan Note (Addendum)
-  Creatinine normally in the 0.6-0.8 range at baseline; at time of admission 2.24 and peaked at 2.72; now down to 1.36.>>1.22>>1.02>>0.92 -due to volume depletion and sepsis/hemodynamic changes

## 2021-07-29 NOTE — Assessment & Plan Note (Addendum)
-  Patient with good saturation on 1-2 L. -Patient had increasing work of breathing to the point of requiring transient use of BiPAP at time of admission. -Continue current IV antibiotics -stable on RA

## 2021-07-29 NOTE — Progress Notes (Addendum)
Pharmacy Antibiotic Note  Elijah [name] is a 81 y.o. male admitted on 07/28/2021 with sepsis.  Pharmacy has been consulted for vancomycin and cefepime dosing. Pt presented with AMS and dyspnea requiring 4L O2. Suspected sepsis from abdominal source. Pt with AKI now s/p fluid resuscitation. PCT 32.54, WBC 11.2, afebrile, LA 6.3>2, RR 37  Vancomycin 2g load given. With AKI, will defer scheduled dosing. With Current Cr, 1750 mg every 48 hours would give AUC of 498. Will continue to trend creatinine and monitor clinical progress.  Plan: Cefepime 2G IV q24 (CrCl ~29.9 mL/min) Vanc variable per unstable renal function Flagyl per MD  Weight: 90 kg (198 lb 6.6 oz)  Temp (24hrs), Avg:99.1 F (37.3 C), Min:98.6 F (37 C), Max:99.7 F (37.6 C)  Recent Labs  Lab 07/26/21 0745 07/28/21 2231 07/28/21 2233 07/29/21 0028 07/29/21 0345 07/29/21 0604  WBC 17.4* 11.4*  --   --  11.2*  --   CREATININE 1.59* 2.24*  --   --  2.30*  --   LATICACIDVEN  --   --  6.3* 4.6* 2.8* 2.0*     Estimated Creatinine Clearance: 29.9 mL/min (A) (by C-G formula based on SCr of 2.3 mg/dL (H)).    Allergies  Allergen Reactions   Aspirin Other (See Comments)    Stomach ulcers.   Coconut (Cocos Nucifera)     Other reaction(s): GI Upset (intolerance)   Penicillins Rash    Has patient had a PCN reaction causing immediate rash, facial/tongue/throat swelling, SOB or lightheadedness with hypotension: no Has patient had a PCN reaction causing severe rash involving mucus membranes or skin necrosis: No Has patient had a PCN reaction that required hospitalization No Has patient had a PCN reaction occurring within the last 10 years: No If all of the above answers are "NO", then may proceed with Cephalosporin use.    [place] 6/9: [facility]   Antimicrobials this admission: Cefepime 6/9>> Flagyl 6/9>> Vancomycin 6/9 >>  Thank you for allowing pharmacy to participate in this patient's care.  Reatha Harps, PharmD PGY1 Pharmacy  Resident 07/29/2021 9:00 AM Check [email or web address] for unit specific pharmacy number

## 2021-07-29 NOTE — Assessment & Plan Note (Addendum)
-  Continue holding ARB and  HCTZ due to AKI -Blood pressure has now stabilized -change lopressor IV to po metoprolol>>d/c due to hypotension on 6/19 -BP and HR will be monitored closely in SDU -Pt was briefly on norepinephrine infusion 6/20 but now OFF norepi

## 2021-07-29 NOTE — Progress Notes (Addendum)
Patient seen and examined. Admitted after midnight secondary to AMS, SOB and presumed sepsis; he had elevated lactic acid, elevated RR, acute renal failure and elevated HR; infection sources suspected to be either his lungs (high risk for aspiration PNA due to underlying hx of parkinson and/or intraabdominal infection). Please refer to H&P written by Dr. Carmin Muskrat on 07/29/21 for further info/details on admission.   *Patient respiratory status deteriorated and was started on BIPAP to assist with work of breathing.  Plan: -continue current IVF's (rate adjusted; and lasix X 1 given). -continue current antibiotics therapy. -constant reorientation and minimize the use sedative agents. -follow electrolytes and replete as needed. -will check B12 and TSH to be thorough and complete work up for metabolic encephalopathy. -follow culture results.   Barton Dubois MD [phone]

## 2021-07-30 ENCOUNTER — Inpatient Hospital Stay (HOSPITAL_COMMUNITY): Payer: PPO

## 2021-07-30 ENCOUNTER — Encounter (HOSPITAL_COMMUNITY): Payer: Self-pay | Admitting: Family Medicine

## 2021-07-30 ENCOUNTER — Other Ambulatory Visit: Payer: Self-pay

## 2021-07-30 DIAGNOSIS — R6520 Severe sepsis without septic shock: Secondary | ICD-10-CM | POA: Diagnosis not present

## 2021-07-30 DIAGNOSIS — K8020 Calculus of gallbladder without cholecystitis without obstruction: Secondary | ICD-10-CM | POA: Diagnosis not present

## 2021-07-30 DIAGNOSIS — K3189 Other diseases of stomach and duodenum: Secondary | ICD-10-CM | POA: Diagnosis not present

## 2021-07-30 DIAGNOSIS — N179 Acute kidney failure, unspecified: Secondary | ICD-10-CM | POA: Diagnosis not present

## 2021-07-30 DIAGNOSIS — Z91148 Patient's other noncompliance with medication regimen for other reason: Secondary | ICD-10-CM

## 2021-07-30 DIAGNOSIS — A419 Sepsis, unspecified organism: Secondary | ICD-10-CM | POA: Diagnosis not present

## 2021-07-30 DIAGNOSIS — G9341 Metabolic encephalopathy: Secondary | ICD-10-CM | POA: Diagnosis not present

## 2021-07-30 DIAGNOSIS — J9601 Acute respiratory failure with hypoxia: Secondary | ICD-10-CM | POA: Diagnosis not present

## 2021-07-30 DIAGNOSIS — R7881 Bacteremia: Secondary | ICD-10-CM

## 2021-07-30 LAB — HEPATIC FUNCTION PANEL
ALT: 10 U/L (ref 0–44)
AST: 68 U/L — ABNORMAL HIGH (ref 15–41)
Albumin: 2.4 g/dL — ABNORMAL LOW (ref 3.5–5.0)
Alkaline Phosphatase: 127 U/L — ABNORMAL HIGH (ref 38–126)
Bilirubin, Direct: 3.7 mg/dL — ABNORMAL HIGH (ref 0.0–0.2)
Indirect Bilirubin: 2.2 mg/dL — ABNORMAL HIGH (ref 0.3–0.9)
Total Bilirubin: 5.9 mg/dL — ABNORMAL HIGH (ref 0.3–1.2)
Total Protein: 5.3 g/dL — ABNORMAL LOW (ref 6.5–8.1)

## 2021-07-30 LAB — CBC WITH DIFFERENTIAL/PLATELET
Abs Immature Granulocytes: 0.5 10*3/uL — ABNORMAL HIGH (ref 0.00–0.07)
Band Neutrophils: 7 %
Basophils Absolute: 0 10*3/uL (ref 0.0–0.1)
Basophils Relative: 0 %
Eosinophils Absolute: 0 10*3/uL (ref 0.0–0.5)
Eosinophils Relative: 0 %
HCT: 30.4 % — ABNORMAL LOW (ref 39.0–52.0)
Hemoglobin: 10.7 g/dL — ABNORMAL LOW (ref 13.0–17.0)
Lymphocytes Relative: 2 %
Lymphs Abs: 0.3 10*3/uL — ABNORMAL LOW (ref 0.7–4.0)
MCH: 32.2 pg (ref 26.0–34.0)
MCHC: 35.2 g/dL (ref 30.0–36.0)
MCV: 91.6 fL (ref 80.0–100.0)
Metamyelocytes Relative: 2 %
Monocytes Absolute: 0.5 10*3/uL (ref 0.1–1.0)
Monocytes Relative: 4 %
Myelocytes: 2 %
Neutro Abs: 12.2 10*3/uL — ABNORMAL HIGH (ref 1.7–7.7)
Neutrophils Relative %: 83 %
Platelets: 75 10*3/uL — ABNORMAL LOW (ref 150–400)
RBC: 3.32 MIL/uL — ABNORMAL LOW (ref 4.22–5.81)
RDW: 15 % (ref 11.5–15.5)
WBC: 13.6 10*3/uL — ABNORMAL HIGH (ref 4.0–10.5)
nRBC: 0 % (ref 0.0–0.2)

## 2021-07-30 LAB — LIPASE, BLOOD: Lipase: 18 U/L (ref 11–51)

## 2021-07-30 LAB — BASIC METABOLIC PANEL
Anion gap: 9 (ref 5–15)
BUN: 84 mg/dL — ABNORMAL HIGH (ref 8–23)
CO2: 19 mmol/L — ABNORMAL LOW (ref 22–32)
Calcium: 7.8 mg/dL — ABNORMAL LOW (ref 8.9–10.3)
Chloride: 108 mmol/L (ref 98–111)
Creatinine, Ser: 2.72 mg/dL — ABNORMAL HIGH (ref 0.61–1.24)
GFR, Estimated: 23 mL/min — ABNORMAL LOW (ref >60)
Glucose, Bld: 128 mg/dL — ABNORMAL HIGH (ref 70–99)
Potassium: 3.9 mmol/L (ref 3.5–5.1)
Sodium: 136 mmol/L (ref 135–145)

## 2021-07-30 LAB — GLUCOSE, CAPILLARY
Glucose-Capillary: 118 mg/dL — ABNORMAL HIGH (ref 70–99)
Glucose-Capillary: 123 mg/dL — ABNORMAL HIGH (ref 70–99)
Glucose-Capillary: 128 mg/dL — ABNORMAL HIGH (ref 70–99)
Glucose-Capillary: 134 mg/dL — ABNORMAL HIGH (ref 70–99)
Glucose-Capillary: 134 mg/dL — ABNORMAL HIGH (ref 70–99)

## 2021-07-30 MED ORDER — SODIUM CHLORIDE 0.9 % IV SOLN
2.0000 g | INTRAVENOUS | Status: DC
  Administered 2021-07-31: 2 g via INTRAVENOUS
  Filled 2021-07-30: qty 12.5

## 2021-07-30 NOTE — Assessment & Plan Note (Addendum)
-  No gallstones has been seen at this point -Continue n.p.o. status -Continue IV antibiotics (using Rocephin). -HIDA scan recommended by general surgery; results with concerns for cystic duct obstruction and cholecystitis.  -No surgical candidate at this time -After discussing with family member decisions made to pursued cholecystostomy drain. -Patient will be transferred back and forth to [facility] for IR intervention.

## 2021-07-30 NOTE — Progress Notes (Signed)
Attempted to insert 69F NG tube x 3 times with 2 different RN attempts, patient unable to follow commands to help facilitate tube placement by swallowing or drinking water, RIGHT nostril x 2 attempts & LEFT nostril x 1 attempt, tube would coil in patient's mouth each attempt, has strong gag reflex & was resisting insertion of tube, small amount of blood noted in nostril & no more attempts made at this time, NG needed to provided PO meds to patient & release air/gas from stomach, MD notified

## 2021-07-30 NOTE — Progress Notes (Signed)
[facility] Progress Note     Subjective: MRCP negative. Bilirubin direct and indirect high. I think something going on with the biliary system but also has indirect high due to sepsis bacteremia. Would expect AST/ALT elevation if shock liver type picture.  Distended especially in epigastric region. Rns unable to give him meds. KUB ordered this am and distended stomach but no signs of ileus type picture.   On HFNC from [place].   Objective: Vital signs in last 24 hours: Temp:  [98.2 F (36.8 C)-98.9 F (37.2 C)] 98.3 F (36.8 C) (06/10 1137) Pulse Rate:  [89-104] 99 (06/10 0700) Resp:  [16-30] 27 (06/10 0700) BP: (101-206)/(32-190) 137/32 (06/10 0700) SpO2:  [95 %-100 %] 99 % (06/10 0738) FiO2 (%):  [40 %] 40 % (06/09 2244)    Intake/Output from previous day: 06/09 0701 - 06/10 0700 In: 962.9 [I.V.:662.9; IV Piggyback:300] Out: 750 [Urine:750] Intake/Output this shift: Total I/O In: -  Out: 300 [Urine:300]  General appearance: sleeping, difficult to awaken, does not open eyes, does not follow commands  GI: soft, distended, minor grimace with palpation deep  Lab Results:  Recent Labs    07/29/21 0345 07/30/21 0444  WBC 11.2* 13.6*  HGB 10.6* 10.7*  HCT 30.9* 30.4*  PLT 90* 75*   BMET Recent Labs    07/29/21 0345 07/30/21 0444  NA 133* 136  K 4.3 3.9  CL 103 108  CO2 20* 19*  GLUCOSE 137* 128*  BUN 59* 84*  CREATININE 2.30* 2.72*  CALCIUM 7.9* 7.8*   PT/INR Recent Labs    07/28/21 2231 07/29/21 0345  LABPROT 14.9 16.6*  INR 1.2 1.4*    Studies/Results: DG Abd 1 View  Result Date: 07/30/2021 CLINICAL DATA:  Gastric distension EXAM: ABDOMEN - 1 VIEW COMPARISON:  None Available. FINDINGS: The bowel gas pattern is nonobstructive. Mild gaseous distension of stomach and bowel. There are no abnormal calcifications overlying the kidneys. Pelvic phleboliths. Chronic right anterolateral tenth eleventh rib injuries. Degenerative changes of  the spine with levoconvex lumbar curvature. IMPRESSION: No evidence of bowel obstruction. Electronically Signed   By: Maurine Simmering M.D.   On: 07/30/2021 11:33   MR ABDOMEN MRCP WO CONTRAST  Result Date: 07/29/2021 CLINICAL DATA:  Upper abdominal pain. Dilated common bile duct on ultrasound. EXAM: MRI ABDOMEN WITHOUT CONTRAST  (INCLUDING MRCP) TECHNIQUE: Multiplanar multisequence MR imaging of the abdomen was performed. Heavily T2-weighted images of the biliary and pancreatic ducts were obtained, and three-dimensional MRCP images were rendered by post processing. COMPARISON:  Ultrasound 07/29/2021 FINDINGS: Motion degradation exam. ICU patient. ICU patients have difficulty following breath holding commands. Lower chest:  Small bilateral pleural effusions Hepatobiliary: No intrahepatic biliary duct dilatation. Common bile duct normal caliber. The no gallstones identified. Small amount sludge within the gallbladder. No gallbladder distension or inflammation. Pancreas: Normal pancreatic parenchymal intensity. No ductal dilatation or inflammation. Spleen: Normal spleen. Adrenals/urinary tract: Adrenal glands and kidneys are normal. Stomach/Bowel: Stomach and limited of the small bowel is unremarkable Vascular/Lymphatic: Abdominal aortic normal caliber. No retroperitoneal periportal lymphadenopathy. Musculoskeletal: No aggressive osseous lesion IMPRESSION: 1. Normal common bile duct. No intrahepatic biliary duct dilatation. 2. Small amount gallbladder sludge.  No evidence of cholecystitis. 3. Normal pancreas. 4. Bibasilar atelectasis. 5. Imaging degraded by respiratory motion. Electronically Signed   By: Suzy Bouchard M.D.   On: 07/29/2021 19:59   MR 3D Recon At Scanner  Result Date: 07/29/2021 CLINICAL DATA:  Upper abdominal pain. Dilated common bile duct on ultrasound. EXAM: MRI  ABDOMEN WITHOUT CONTRAST  (INCLUDING MRCP) TECHNIQUE: Multiplanar multisequence MR imaging of the abdomen was performed. Heavily  T2-weighted images of the biliary and pancreatic ducts were obtained, and three-dimensional MRCP images were rendered by post processing. COMPARISON:  Ultrasound 07/29/2021 FINDINGS: Motion degradation exam. ICU patient. ICU patients have difficulty following breath holding commands. Lower chest:  Small bilateral pleural effusions Hepatobiliary: No intrahepatic biliary duct dilatation. Common bile duct normal caliber. The no gallstones identified. Small amount sludge within the gallbladder. No gallbladder distension or inflammation. Pancreas: Normal pancreatic parenchymal intensity. No ductal dilatation or inflammation. Spleen: Normal spleen. Adrenals/urinary tract: Adrenal glands and kidneys are normal. Stomach/Bowel: Stomach and limited of the small bowel is unremarkable Vascular/Lymphatic: Abdominal aortic normal caliber. No retroperitoneal periportal lymphadenopathy. Musculoskeletal: No aggressive osseous lesion IMPRESSION: 1. Normal common bile duct. No intrahepatic biliary duct dilatation. 2. Small amount gallbladder sludge.  No evidence of cholecystitis. 3. Normal pancreas. 4. Bibasilar atelectasis. 5. Imaging degraded by respiratory motion. Electronically Signed   By: Suzy Bouchard M.D.   On: 07/29/2021 19:59   DG CHEST PORT 1 VIEW  Result Date: 07/29/2021 CLINICAL DATA:  Shortness of breath.  Chest pain.  Diaphoresis. EXAM: PORTABLE CHEST 1 VIEW COMPARISON:  07/29/2021 FINDINGS: Low lung volumes are present, causing crowding of the pulmonary vasculature. There is likely mild subsegmental atelectasis along both hemidiaphragms. Mild cardiomegaly. Prominence of the right upper mediastinal margin, cannot exclude adenopathy or nodule. IMPRESSION: 1. Low lung volumes and suspected mild atelectasis at the lung bases. 2. Somewhat nodular prominence the right upper mediastinum, cannot exclude adenopathy or nodule although this might possibly be from tortuous vasculature. Chest CT could be utilized for further  characterization. 3. Mild cardiomegaly. Electronically Signed   By: Van Clines M.D.   On: 07/29/2021 10:05   US Abdomen Limited RUQ (LIVER/GB)  Result Date: 07/29/2021 CLINICAL DATA:  Abnormal LFTs EXAM: ULTRASOUND ABDOMEN LIMITED RIGHT UPPER QUADRANT COMPARISON:  CT abdomen and pelvis 07/28/2021 FINDINGS: Gallbladder: Moderately distended and contains echogenic calculi and sludge dependently. 5 mm echogenic likely polyp at the fundus. No significant wall thickening or pericholecystic fluid identified. Common bile duct: Diameter: 8 mm, upper normal for the patient's age. Liver: No focal lesion identified. Within normal limits in parenchymal echogenicity. Portal vein is patent on color Doppler imaging with normal direction of blood flow towards the liver. Other: None. IMPRESSION: 1. Cholelithiasis and sludge. 2. 5 mm likely polyp in the gallbladder. 3. Upper normal caliber of the common bile duct and moderate distention of the gallbladder. Correlate clinically and consider MRCP if indicated. Electronically Signed   By: Ofilia Neas M.D.   On: 07/29/2021 09:56   DG CHEST PORT 1 VIEW  Result Date: 07/29/2021 CLINICAL DATA:  Wheezing EXAM: PORTABLE CHEST 1 VIEW COMPARISON:  07/28/2021 FINDINGS: Limited low volume chest with generalized interstitial coarsening/crowding. No Kerley lines or pleural effusion. Normal heart size and mediastinal contours. Extensive artifact from EKG leads IMPRESSION: Limited low volume chest that is stable from yesterday Electronically Signed   By: Jorje Guild M.D.   On: 07/29/2021 06:48   DG Wrist Complete Left  Result Date: 07/29/2021 CLINICAL DATA:  Wrist pain EXAM: LEFT WRIST - COMPLETE 3+ VIEW COMPARISON:  None Available. FINDINGS: No fracture or malalignment. Vascular calcifications. Advanced joint space narrowing of the radiocarpal joint. No erosions. IMPRESSION: 1. 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 2. No acute osseous abnormality Electronically Signed   By:  Donavan Foil M.D.   On: 07/29/2021 00:09   CT ABDOMEN  PELVIS WO CONTRAST  Result Date: 07/28/2021 CLINICAL DATA:  Abdominal pain EXAM: CT ABDOMEN AND PELVIS WITHOUT CONTRAST TECHNIQUE: Multidetector CT imaging of the abdomen and pelvis was performed following the standard protocol without IV contrast. RADIATION DOSE REDUCTION: This exam was performed according to the departmental dose-optimization program which includes automated exposure control, adjustment of the mA and/or kV according to patient size and/or use of iterative reconstruction technique. COMPARISON:  07/26/2021 FINDINGS: Lower chest: Small pleural effusions are noted bilaterally slightly increased when compared with the prior exam. Mild bibasilar atelectasis is seen. Hepatobiliary: Liver is within normal limits. Dependent gallstones are noted within the gallbladder which is well distended. Pneumobilia is again identified consistent with the given clinical history of sphincterotomy. Pancreas: Unremarkable. No pancreatic ductal dilatation or surrounding inflammatory changes. Spleen: Normal in size without focal abnormality. Adrenals/Urinary Tract: Adrenal glands are within normal limits bilaterally. Kidneys show no tiny nonobstructing renal stone on the right. No definitive stones are noted on the left. Hypodense lesion is noted within the left kidney measuring approximately 1.5 cm consistent with small cysts stable in appearance from the prior exam. No obstructive changes are seen. The bladder is well distended with mildly opacified urine. A portion of the bladder extends into a large right sided inguinal hernia. This is stable in appearance from the prior exam. Stomach/Bowel: Scattered fecal material is noted throughout the colon. Mild diverticular change is seen. The appendix has been surgically removed consistent with the given clinical history. Small bowel and stomach are unremarkable. Vascular/Lymphatic: Aortic atherosclerosis. No enlarged  abdominal or pelvic lymph nodes. Reproductive: Prostate is unremarkable. Other: No ascites is noted. Bilateral fat containing inguinal hernias are seen. Persistent herniated bladder into the right hernia is noted. Musculoskeletal: Degenerative changes of lumbar spine are seen. IMPRESSION: Small nonobstructing right renal stone. Bilateral inguinal hernias with herniated bladder on the right stable from the prior study. Multiple small gallstones without complicating factors. No other focal abnormality is noted. Electronically Signed   By: Inez Catalina M.D.   On: 07/28/2021 23:58   DG Chest Port 1 View  Result Date: 07/28/2021 CLINICAL DATA:  Respiratory distress EXAM: PORTABLE CHEST 1 VIEW COMPARISON:  07/26/2021 FINDINGS: Hypoventilatory changes. No consolidation or effusion. Stable cardiomediastinal silhouette. No pneumothorax IMPRESSION: No active disease.  Low lung volumes. Electronically Signed   By: Donavan Foil M.D.   On: 07/28/2021 22:42    Anti-infectives: Anti-infectives (From admission, onward)    Start     Dose/Rate Route Frequency Ordered Stop   07/31/21 1000  ceFEPIme (MAXIPIME) 2 g in sodium chloride 0.9 % 100 mL IVPB        2 g 200 mL/hr over 30 Minutes Intravenous Every 24 hours 07/30/21 1035     07/29/21 2200  ceFEPIme (MAXIPIME) 2 g in sodium chloride 0.9 % 100 mL IVPB  Status:  Discontinued        2 g 200 mL/hr over 30 Minutes Intravenous Every 24 hours 07/29/21 0102 07/29/21 1613   07/29/21 1800  ceFEPIme (MAXIPIME) 2 g in sodium chloride 0.9 % 100 mL IVPB  Status:  Discontinued        2 g 200 mL/hr over 30 Minutes Intravenous Every 12 hours 07/29/21 1613 07/30/21 1035   07/29/21 1200  metroNIDAZOLE (FLAGYL) IVPB 500 mg        500 mg 100 mL/hr over 60 Minutes Intravenous Every 12 hours 07/29/21 0044     07/29/21 0903  vancomycin variable dose per unstable renal function (pharmacist dosing)  Status:  Discontinued         Does not apply See admin instructions 07/29/21 0903  07/29/21 1552   07/28/21 2345  vancomycin (VANCOREADY) IVPB 2000 mg/400 mL  Status:  Discontinued        2,000 mg 200 mL/hr over 120 Minutes Intravenous  Once 07/28/21 2334 07/29/21 0247   07/28/21 2300  ceFEPIme (MAXIPIME) 2 g in sodium chloride 0.9 % 100 mL IVPB        2 g 200 mL/hr over 30 Minutes Intravenous  Once 07/28/21 2245 07/28/21 2356   07/28/21 2300  metroNIDAZOLE (FLAGYL) IVPB 500 mg        500 mg 100 mL/hr over 60 Minutes Intravenous  Once 07/28/21 2245 07/29/21 0147   07/28/21 2300  vancomycin (VANCOCIN) IVPB 1000 mg/200 mL premix  Status:  Discontinued        1,000 mg 200 mL/hr over 60 Minutes Intravenous  Once 07/28/21 2245 07/28/21 2333       Assessment/Plan: Patient with bacteremia with strep of unknown etiology, LFTs elevated and bilirubin elevated, no signs of CBD obstruction of dilation or cholecystitis on the MRCP.  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 Other etiologies of sepsis unknown/ source unknown? Trending LFTs and labs, ordered GGT for tomorrow too    LOS: 1 day    Virl Cagey 07/30/2021

## 2021-07-30 NOTE — Progress Notes (Signed)
Patient off BiPAP , placed on 5 liters for now.

## 2021-07-30 NOTE — Progress Notes (Signed)
[facility]  Ng curls back in esophagus towards the neck.  We'll get nurse to discontinue will not be able to do meds the NG tube today.   Someone can try again later, if Patient is up to it.  Curlene Labrum, MD

## 2021-07-30 NOTE — Assessment & Plan Note (Addendum)
-  OBSTRUCTION not appreciated on MRCP. -Continue to follow trend -Maintain adequate hydration -GI and general surgery on board will follow recommendations. -HIDA scan ordered and suggesting positive cholecystitis and possible cystic duct obstruction.  -planning cholecystostomy later today.

## 2021-07-30 NOTE — Assessment & Plan Note (Addendum)
6/15 Repeat blood culture--GPC 1 of 2 sets Echo--EF 70-75%, no WMA, G1DD, no vegetation, trivial MR, mild TR Source - Gallbladder Repeat blood cultures--neg for GBS

## 2021-07-30 NOTE — Progress Notes (Signed)
[facility]  Talked to Dr. Autumn Patty- malpositioned NG; not in the stomach. Read is being fixed.  Will remove.  Curlene Labrum, MD

## 2021-07-30 NOTE — Progress Notes (Signed)
Progress Note   Patient: Elijah [name] BCW:[id number] DOB: 07-04-1940 DOA: 07/28/2021     1 DOS: the patient was seen and examined on 07/30/2021   Brief hospital course: As per H&P written by Dr.Zierle-Ghosh On 07/29/2021 Elijah [name] is a 81 y.o. male with medical history significant of history of chronic low back pain, diabetes mellitus type 2, hyperlipidemia, hypertension, Parkinson disease, obstructive sleep apnea, history of biliary obstruction and pancreatitis with ERCP in 2018, presents ED with chief complaint of dyspnea.  At time of presentation wife was at bedside.  She reported to the ED provider that patient has had 24 hours of shortness of breath and increased work of breathing.  He was recently seen in the ED 2 days ago for back pain, which was acute on chronic.  They had denied fevers at home, but wife did report the patient had left arm pain when she was trying to lift him up.  ED provider note reports that patient denied current abdominal pain.  Patient is not answering questions for me, but does have voluntary guarding with palpation of his abdomen.  Patient arrived in the ER satting in the low 90s on 4 L nasal cannula.  He was put on nonrebreather for work of breathing.  Sepsis protocol was started.  Patient was treated with vancomycin, cefepime, Flagyl, and given a 3 L bolus.  Lactic acid was significantly elevated 6.3, repeat 4.6.  Chest x-ray was negative for acute disease, CT abdomen pelvis did not given definitive explanation of his symptoms either.  Urine was borderline.  Patient did have an AKI.  Admission was requested for further work-up of altered mental status, dyspnea, and most likely sepsis.  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 -Creatinine normally in the 0.6-0.8 range at baseline; at time of admission 2.24 and currently up to 2.8. -Bicarb 19 -Most likely in the setting  of infection process and prerenal azotemia.  Continue to maintain adequate hydration while following renal function trend. -Avoid hypotension and minimize the use of nephrotoxic agents.    Hyperbilirubinemia - OBSTRUCTION appreciated -Continue to follow trend -Maintain adequate hydration -GI and general surgery on board will follow recommendations.  Bacteremia - Strep B bacteremia appreciated -Continue treatment with cefepime. -Follow culture results and sensitivity.  Acute metabolic encephalopathy - Baseline is unclear, and history was taken mostly by wife by ED provider. -Patient with underlying history of mood disorder/dementia and Parkinson. -Able to answer some simple questions and yesterday examination.  Very obtunded/lethargic today. -Patient is not following commands, or responding at all at this time -He was noted to be a poor historian in 2021, but he was answering questions at that time to the so we can assume that he is altered -Most likely secondary to metabolic acidosis, sepsis -Treatment as already mentioned. -Nonfocal deficits appreciated -Continue to monitor  HLD (hyperlipidemia) -Patient does have an elevated T. bili, his transaminases are at baseline -If he develops transaminitis, will hold the the use of further statin.  Lactic acidosis -Secondary to sepsis -Initial lactic acid 6.3, repeat 4.6>>  -Continue IV hydration, trend again in 2 hours -Respiratory compensation breathing 30-44 breaths/min,  -Continue to monitor and maintain adequate hydration.  Acute respiratory failure with hypoxia (HCC) - Patient was satting low 90s on 4 L at presentation -He was increased to nonrebreather for work of breathing -Patient continues to be tachypneic on nonrebreather -Serum for  pneumonia on the right, and patient is septic -Continue treatment for sepsis -No chest pain reported.  Severe sepsis (HCC) - Heart rate 100, respiratory rate 30-44, lactic acidosis 6.3,  AKI 2.24 -Possible intra-abdominal infection versus aspiration pneumonia; patient blood culture positive for strep B -Continue IV cefepime and Flagyl -Continue to maintain adequate hydration -Follow resolution of sepsis physiology. -He is requiring transient use of BiPAP and is now on high flow nasal cannula supplementation. -COVID and flu are negative    Gallstones -No gallstones has been seen at this point -Continue n.p.o. status -Continue current antibiotic -No surgical candidate at this time.    Total bilirubin, elevated -Bili up to 5.7 from 2.5 -Patient does have history of biliary obstruction and ERCP back in 2018 -CT scan shows bilateral inguinal hernias, herniated bladder on the right, small nonobstructing stone, and gallstones without complication -Given concern for cholangitis we will continue the use of cefepime and Flagyl. -Vancomycin has been discontinued. -MRCP has rule out retained stones or any masses in his liver. -Recall hydration LFTs trend. -Appreciate general surgery assistance and recommendations; will also follow recommendations by GI service. -No endoscopic interventions needed at this point.  Essential hypertension - Continue holding ARB, HCTZ for AKI -Blood pressure is soft but is stable; continue to follow vital signs.   Diabetes mellitus without complication ([place]) - Continue holding metformin -Patient currently n.p.o. -Will continue to monitor glucose with CBGs and if it starts trending up will add insulin coverage.  Parkinson disease ([place]) -Continue patient's Sinemet when able to take by mouth. -Patient with underlying mood disorder and mild hallucinations/psychosis as per patient's wife reports. -Will resume the use of olanzapine when able to take PO, or able to place NGT for meds.   Subjective:  Low-grade temperature overnight; continues to be tachypneic, requiring 5 L high flow nasal cannula supplementation and demonstrating distended  abdomen with positive guarding.  Patient mentation continued to be diffusely obtunded and high risk for aspiration if overall intake attempted.  Physical Exam: Vitals:   07/30/21 0700 07/30/21 0737 07/30/21 0738 07/30/21 1137  BP: (!) 137/32     Pulse: 99     Resp: (!) 27     Temp:  98.2 F (36.8 C)  98.3 F (36.8 C)  TempSrc:  Axillary  Axillary  SpO2: 100%  99%   Weight:      Height:       General exam: Somnolent/lethargic and experiencing intermittent episodes of tremors after lacking the use of his Sinemet due to known reliable swallowing process at this moment.  Low-grade temperature overnight. Respiratory system: Tachypneic, with some improvement in his labored breathing; off BiPAP and currently tolerating 5 L high flow nasal cannula supplementation. Cardiovascular system: Mild tachycardia, no rubs, no gallops, no JVD. Gastrointestinal system: Abdomen is distended and mildly tense on palpation.  Some tympany and guarding appreciated; positive bowel sounds Central nervous system: Unable to properly assess secondary to current mentation; moving 4 limbs spontaneously. Extremities: No cyanosis or clubbing; trace to 1+ edema appreciated bilaterally (lower and upper extremities). Skin: No petechiae. Psychiatry: Judgement and insight appear impaired and difficult to assess in the setting of acute encephalopathy and underlying dementia. Data Reviewed: CBC with WBCs of 13.6, hemoglobin 10.7 and platelet count 75 K Comprehensive metabolic panel: Demonstrating AST 68, ALT 10, alk phos 127 total bilirubin 5.9, direct bilirubin 3.7 and indirect bilirubin 2.2; sodium 136, potassium 3.9, bicarb 19, BUN 84 and creatinine 2.7. Lipase 13  Family Communication: No family at  bedside.  Disposition: Status is: Inpatient Remains inpatient appropriate because: Still with ongoing alcohol mental status changes, presence of sepsis features with concern for aspiration pneumonia versus cholangitis and a  strep B bacteremia.  Continue to require IV antibiotics   Planned Discharge Destination: To be determined.   Author: Barton Dubois, MD 07/30/2021 1:25 PM  For on call review [email or web address].

## 2021-07-30 NOTE — Progress Notes (Signed)
Pharmacy Antibiotic Note  Elijah [name] is a 81 y.o. male admitted on 07/28/2021 with sepsis.  Pharmacy has been consulted for cefepime dosing. Suspected abdominal source; WBC 11.4, afebrile, LA 6.3, RR 37  Plan: Cefepime 2G IV q24 (CrCl ~26.3 mL/min)  Height: '5\' 10"'$  (177.8 cm) Weight: 98.2 kg (216 lb 7.9 oz) IBW/kg (Calculated) : 73  Temp (24hrs), Avg:98.4 F (36.9 C), Min:97.8 F (36.6 C), Max:98.9 F (37.2 C)  Recent Labs  Lab 07/26/21 0745 07/28/21 2231 07/28/21 2233 07/29/21 0028 07/29/21 0345 07/29/21 0604 07/30/21 0444  WBC 17.4* 11.4*  --   --  11.2*  --  13.6*  CREATININE 1.59* 2.24*  --   --  2.30*  --  2.72*  LATICACIDVEN  --   --  6.3* 4.6* 2.8* 2.0*  --      Estimated Creatinine Clearance: 26.3 mL/min (A) (by C-G formula based on SCr of 2.72 mg/dL (H)).    Allergies  Allergen Reactions   Aspirin Other (See Comments)    Stomach ulcers.   Coconut (Cocos Nucifera)     Other reaction(s): GI Upset (intolerance)   Penicillins Rash    Has patient had a PCN reaction causing immediate rash, facial/tongue/throat swelling, SOB or lightheadedness with hypotension: no Has patient had a PCN reaction causing severe rash involving mucus membranes or skin necrosis: No Has patient had a PCN reaction that required hospitalization No Has patient had a PCN reaction occurring within the last 10 years: No If all of the above answers are "NO", then may proceed with Cephalosporin use.     Antimicrobials this admission: Cefepime 6/9>> Flagyl 6/9>> Vanc 6/9   Bcx 6/8: GPC BCID- group B strep   Thank you for allowing pharmacy to be a part of this patient's care.  Donna Christen Jillian Pianka 07/30/2021 10:36 AM

## 2021-07-30 NOTE — Progress Notes (Addendum)
[facility]  NG attempts X 4, tried larger tube and was in the right lung. 12 fr Smaller tube in the stomach.  Air bubbles heard in stomach region. CXR ordered to confirm.  Needs NG for his parkinson's meds and also has gastric distention from Bipap last night.    Curlene Labrum, MD [facility] [address] [place], [place] [address] [phone] (office)

## 2021-07-30 NOTE — Progress Notes (Signed)
NG tube malpositioned per x-ray, never accessed or used, NG removed as instructed per MD

## 2021-07-31 ENCOUNTER — Inpatient Hospital Stay (HOSPITAL_COMMUNITY): Payer: PPO

## 2021-07-31 DIAGNOSIS — N179 Acute kidney failure, unspecified: Secondary | ICD-10-CM | POA: Diagnosis not present

## 2021-07-31 DIAGNOSIS — A419 Sepsis, unspecified organism: Secondary | ICD-10-CM | POA: Diagnosis not present

## 2021-07-31 DIAGNOSIS — J9601 Acute respiratory failure with hypoxia: Secondary | ICD-10-CM | POA: Diagnosis not present

## 2021-07-31 DIAGNOSIS — G9341 Metabolic encephalopathy: Secondary | ICD-10-CM | POA: Diagnosis not present

## 2021-07-31 DIAGNOSIS — D696 Thrombocytopenia, unspecified: Secondary | ICD-10-CM | POA: Diagnosis present

## 2021-07-31 LAB — BLOOD GAS, ARTERIAL
Acid-base deficit: 1.1 mmol/L (ref 0.0–2.0)
Bicarbonate: 22.4 mmol/L (ref 20.0–28.0)
Drawn by: 22179
FIO2: 28 %
O2 Saturation: 97 %
Patient temperature: 36.5
pCO2 arterial: 32 mmHg (ref 32–48)
pH, Arterial: 7.45 (ref 7.35–7.45)
pO2, Arterial: 77 mmHg — ABNORMAL LOW (ref 83–108)

## 2021-07-31 LAB — URINE CULTURE: Culture: NO GROWTH

## 2021-07-31 LAB — CBC WITH DIFFERENTIAL/PLATELET
Abs Immature Granulocytes: 0.66 10*3/uL — ABNORMAL HIGH (ref 0.00–0.07)
Basophils Absolute: 0.1 10*3/uL (ref 0.0–0.1)
Basophils Relative: 0 %
Eosinophils Absolute: 0 10*3/uL (ref 0.0–0.5)
Eosinophils Relative: 0 %
HCT: 29.6 % — ABNORMAL LOW (ref 39.0–52.0)
Hemoglobin: 10.3 g/dL — ABNORMAL LOW (ref 13.0–17.0)
Immature Granulocytes: 5 %
Lymphocytes Relative: 8 %
Lymphs Abs: 1 10*3/uL (ref 0.7–4.0)
MCH: 32 pg (ref 26.0–34.0)
MCHC: 34.8 g/dL (ref 30.0–36.0)
MCV: 91.9 fL (ref 80.0–100.0)
Monocytes Absolute: 0.6 10*3/uL (ref 0.1–1.0)
Monocytes Relative: 5 %
Neutro Abs: 10.5 10*3/uL — ABNORMAL HIGH (ref 1.7–7.7)
Neutrophils Relative %: 82 %
Platelets: 72 10*3/uL — ABNORMAL LOW (ref 150–400)
RBC: 3.22 MIL/uL — ABNORMAL LOW (ref 4.22–5.81)
RDW: 15.1 % (ref 11.5–15.5)
WBC: 12.8 10*3/uL — ABNORMAL HIGH (ref 4.0–10.5)
nRBC: 0 % (ref 0.0–0.2)

## 2021-07-31 LAB — CULTURE, BLOOD (ROUTINE X 2): Special Requests: ADEQUATE

## 2021-07-31 LAB — COMPREHENSIVE METABOLIC PANEL
ALT: 12 U/L (ref 0–44)
AST: 52 U/L — ABNORMAL HIGH (ref 15–41)
Albumin: 2.1 g/dL — ABNORMAL LOW (ref 3.5–5.0)
Alkaline Phosphatase: 137 U/L — ABNORMAL HIGH (ref 38–126)
Anion gap: 9 (ref 5–15)
BUN: 104 mg/dL — ABNORMAL HIGH (ref 8–23)
CO2: 18 mmol/L — ABNORMAL LOW (ref 22–32)
Calcium: 7.4 mg/dL — ABNORMAL LOW (ref 8.9–10.3)
Chloride: 113 mmol/L — ABNORMAL HIGH (ref 98–111)
Creatinine, Ser: 2.55 mg/dL — ABNORMAL HIGH (ref 0.61–1.24)
GFR, Estimated: 25 mL/min — ABNORMAL LOW (ref >60)
Glucose, Bld: 140 mg/dL — ABNORMAL HIGH (ref 70–99)
Potassium: 3.6 mmol/L (ref 3.5–5.1)
Sodium: 140 mmol/L (ref 135–145)
Total Bilirubin: 4.4 mg/dL — ABNORMAL HIGH (ref 0.3–1.2)
Total Protein: 5.2 g/dL — ABNORMAL LOW (ref 6.5–8.1)

## 2021-07-31 LAB — LACTIC ACID, PLASMA: Lactic Acid, Venous: 1 mmol/L (ref 0.5–1.9)

## 2021-07-31 LAB — GLUCOSE, CAPILLARY
Glucose-Capillary: 137 mg/dL — ABNORMAL HIGH (ref 70–99)
Glucose-Capillary: 123 mg/dL — ABNORMAL HIGH (ref 70–99)

## 2021-07-31 LAB — GAMMA GT: GGT: 71 U/L — ABNORMAL HIGH (ref 7–50)

## 2021-07-31 LAB — AMMONIA: Ammonia: 41 umol/L — ABNORMAL HIGH (ref 9–35)

## 2021-07-31 MED ORDER — LORAZEPAM 2 MG/ML IJ SOLN
0.5000 mg | Freq: Once | INTRAMUSCULAR | Status: AC
Start: 2021-07-31 — End: 2021-07-31
  Administered 2021-07-31: 0.5 mg via INTRAVENOUS
  Filled 2021-07-31: qty 1

## 2021-07-31 MED ORDER — METOPROLOL TARTRATE 5 MG/5ML IV SOLN
2.5000 mg | Freq: Two times a day (BID) | INTRAVENOUS | Status: DC
  Administered 2021-07-31 – 2021-08-07 (×14): 2.5 mg via INTRAVENOUS
  Filled 2021-07-31 (×15): qty 5

## 2021-07-31 MED ORDER — CEFTRIAXONE SODIUM 2 G IJ SOLR
2.0000 g | INTRAMUSCULAR | Status: DC
  Administered 2021-07-31 – 2021-08-07 (×8): 2 g via INTRAVENOUS
  Filled 2021-07-31 (×8): qty 20

## 2021-07-31 NOTE — Progress Notes (Signed)
Progress Note   Patient: Elijah [name] FTD:[id number] DOB: Aug 24, 1940 DOA: 07/28/2021     2 DOS: the patient was seen and examined on 07/31/2021   Brief hospital course: As per H&P written by Dr.Zierle-Ghosh On 07/29/2021 WILLIE PLAIN is a 81 y.o. male with medical history significant of history of chronic low back pain, diabetes mellitus type 2, hyperlipidemia, hypertension, Parkinson disease, obstructive sleep apnea, history of biliary obstruction and pancreatitis with ERCP in 2018, presents ED with chief complaint of dyspnea.  At time of presentation wife was at bedside.  She reported to the ED provider that patient has had 24 hours of shortness of breath and increased work of breathing.  He was recently seen in the ED 2 days ago for back pain, which was acute on chronic.  They had denied fevers at home, but wife did report the patient had left arm pain when she was trying to lift him up.  ED provider note reports that patient denied current abdominal pain.  Patient is not answering questions for me, but does have voluntary guarding with palpation of his abdomen.  Patient arrived in the ER satting in the low 90s on 4 L nasal cannula.  He was put on nonrebreather for work of breathing.  Sepsis protocol was started.  Patient was treated with vancomycin, cefepime, Flagyl, and given a 3 L bolus.  Lactic acid was significantly elevated 6.3, repeat 4.6.  Chest x-ray was negative for acute disease, CT abdomen pelvis did not given definitive explanation of his symptoms either.  Urine was borderline.  Patient did have an AKI.  Admission was requested for further work-up of altered mental status, dyspnea, and most likely sepsis.  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 -Creatinine normally in the 0.6-0.8 range at baseline; at time of admission 2.24 and 2.55. -Bicarb 18 and BUN 105 -Most likely in the setting of  infection process and prerenal azotemia.   -Continue to maintain adequate hydration while following renal function trend. -Avoid hypotension and minimize the use of nephrotoxic agents.    Thrombocytopenia ([place]) - Most likely in the setting of sepsis -Platelets count have trended down into the 70sK -Holding heparin products -SCDs for DVT prophylaxis. -Follow platelet count -No operative bleeding.  Hyperbilirubinemia - OBSTRUCTION not appreciated on MRCP. -Continue to follow trend -Maintain adequate hydration -GI and general surgery on board will follow recommendations.  Bacteremia - Strep B bacteremia appreciated -Continue treatment with IV antibiotics; IV Rocephin has been initiated following sensitivity and cultures results.   Acute metabolic encephalopathy -Baseline is unclear, and history was taken mostly by wife and by ED provider. -Patient with underlying history of mood disorder/dementia and Parkinson. -Continue to be very obtunded/lethargic today. -Noticed blood urea 105 (probably contributing). -Will also check ammonia level, ABG and CT head. -Patient is not following commands -Most likely secondary to metabolic acidosis and sepsis -Treatment as already mentioned. -Nonfocal deficits appreciated -Continue to monitor  HLD (hyperlipidemia) -Patient mentation avoiding him to be able to take oral medication -Holding statin for now.  Lactic acidosis -Secondary to sepsis -Initial lactic acid 6.3, repeat 4.6>> now 1.0 -Continue to maintain adequate IV hydration   Acute respiratory failure with hypoxia ([place]) -Patient with good saturation currently on 4-5 L. -Patient ended increasing increased work of breathing to the point of requiring transient use of BiPAP  -Continue current IV antibiotics as he had chest x-ray  findings suggesting pneumonia on the right side.   -Patient continues to be tachypneic intermittently. -No chest pain reported.  Severe sepsis  (H[facility]) -Heart rate 100, respiratory rate 30-44, lactic acidosis 6.3, AKI 2.24 -Possible intra-abdominal infection versus aspiration pneumonia; patient blood culture positive for strep B -Continue IV Rocephin based on sensitivity results for strep B. -Continue to maintain adequate hydration -Follow resolution of sepsis physiology. -Off BiPAP and with good saturation demonstrated on 4-5 L Account supplementation. -COVID and flu are negative    Gallstones -No gallstones has been seen at this point -Continue n.p.o. status -Continue IV antibiotics (using Rocephin). -HIDA scan recommended by general surgery. -No surgical candidate at this time; but if needed will qualify for cholecystostomy..    Total bilirubin, elevated -Bili 4.4; down from 5.7. -Patient does have history of biliary obstruction and ERCP back in 2018 -CT scan shows bilateral inguinal hernias, herniated bladder on the right, small nonobstructing stone, and gallstones without complication -Vancomycin has been discontinued. -MRCP has rule out retained stones or any masses in his liver. -Continue to maintain adequate hydration -LFTs has continued trending down -Continue IV antibiotics using Rocephin based on cultures/sensitivity results. -After discussing with general surgery will pursue HIDA scan evaluation on 08/01/2021. -No endoscopic interventions needed at this point.  Essential hypertension -Continue holding ARB, HCTZ for AKI -Blood pressure has now stabilized; given the presence of irregular heart rate low-dose Lopressor IV will be initiated.   Diabetes mellitus without complication ([place]) -Continue holding metformin -Patient currently n.p.o. -Will continue to monitor glucose with CBGs and if it starts trending up will add insulin coverage.  Parkinson disease ([place]) -Continue patient's Sinemet when able to take by mouth. -Patient with underlying mood disorder and mild hallucinations/psychosis as per patient's wife  reports. -Too lethargic to safely take oral meds currently. -Unable to pass NG tube on 07/30/21.   Subjective:  Afebrile, no chest pain, no nausea or vomiting reported.  Patient has flipped into atrial fibrillation with heart rate into the 110's-120; significantly lethargic, unable to be aroused and unsafe to take oral medications.  Physical Exam: Vitals:   07/31/21 1000 07/31/21 1100 07/31/21 1154 07/31/21 1200  BP: (!) 144/51 (!) 143/55  (!) 113/55  Pulse:    93  Resp: (!) 26 (!) 25  (!) 24  Temp:   97.6 F (36.4 C)   TempSrc:   Axillary   SpO2:    98%  Weight:      Height:       General exam: Very lethargic/difficult to arouse.  Experiencing intermittent tremors and jerking movement.  Unsafe for patient to take oral medications due to increased risk of aspiration. Respiratory system: No wheezing, no crackles appreciated on examination.  Intermittent tachypnea has been seen.  Good saturation on 4-5 L nasal cannula supplementation. Cardiovascular system: Irregular, sinus tachycardia. No rubs, no gallops. Gastrointestinal system: Abdomen is distended, no tenderness elicited on palpation; no guarding.  Positive bowel sounds.   Central nervous system: Alert and oriented.  Not following commands; moving 4 limbs spontaneously unable to retract to pain. Extremities: No cyanosis or clubbing; trace edema appreciated bilaterally. Skin: No petechiae. Psychiatry: Unable to assess with current encephalopathic process.  Data Reviewed: CBC: WBCs 12.8, hemoglobin 10.3, platelets count 72 K Comprehensive metabolic panel: Sodium 749, potassium 3.6, bicarb 18, BUN 104, creatinine 2.55 AST 52, ALT 12, alk phos 137 and total bilirubin 4.4. Lipase 13 Lactic acid 1.0  Family Communication: No family at bedside.  Disposition: Status is: Inpatient Remains  inpatient appropriate because: Still with ongoing alcohol mental status changes, presence of sepsis features with concern for aspiration pneumonia  versus cholangitis and a strep B bacteremia.  Continue to require IV antibiotics   Planned Discharge Destination: To be determined.   Author: Barton Dubois, MD 07/31/2021 2:01 PM  For on call review [email or web address].

## 2021-07-31 NOTE — Progress Notes (Signed)
SLP Cancellation Note  Patient Details Name: Elijah [name] MRN: [id number] DOB: 04-26-1940   Cancelled treatment:       Reason Eval/Treat Not Completed: Fatigue/lethargy limiting ability to participate;Patient's level of consciousness;Patient not medically ready. Attempted BSE; Pt is off BiPAP and tolerating on 4L/mn but was sedated ~30 mins ago for a CT scan. Further note he was reportedly not alert enough for PO trials even prior to being given ativan. He is not appropriate for BSE at this time. ST will continue efforts.   Xiana Carns H. Roddie Mc, CCC-SLP Speech Language Pathologist    Wende Bushy 07/31/2021, 2:36 PM

## 2021-07-31 NOTE — Assessment & Plan Note (Addendum)
-  due to sepsis -Platelets count nadir 70sK -improving with sepsis tx -restart heparin [place]

## 2021-07-31 NOTE — Progress Notes (Addendum)
[facility] Progress Note     Subjective: NG attempt yesterday was unsuccessful. Patient still encephalopathic, HR remains elevated, BP ok. Bilirubin is coming down. Patient getting antibiotics for strep bacteremia of unknown etiology. Discussed with wife that we are still not sure where this came from but that the gallbladder is still a possibility, discussed HIDA tomorrow.   She is worried about his mental status and question if he needs imaging, discussed encephalopathy from sepsis.   Objective: Vital signs in last 24 hours: Temp:  [97.2 F (36.2 C)-100 F (37.8 C)] 97.6 F (36.4 C) (06/11 1154) Pulse Rate:  [79-123] 93 (06/11 1200) Resp:  [16-27] 24 (06/11 1200) BP: (100-150)/(45-80) 113/55 (06/11 1200) SpO2:  [96 %-100 %] 98 % (06/11 1200)    Intake/Output from previous day: 06/10 0701 - 06/11 0700 In: -  Out: 1100 [Urine:1100] Intake/Output this shift: Total I/O In: 260.6 [IV Piggyback:260.6] Out: -   General appearance: eyes closed, grimaces to painful stimuli GI: soft, distended in epigastric region, RUQ and epigastric grimacing with palpation   Lab Results:  Recent Labs    07/30/21 0444 07/31/21 0330  WBC 13.6* 12.8*  HGB 10.7* 10.3*  HCT 30.4* 29.6*  PLT 75* 72*   BMET Recent Labs    07/30/21 0444 07/31/21 0330  NA 136 140  K 3.9 3.6  CL 108 113*  CO2 19* 18*  GLUCOSE 128* 140*  BUN 84* 104*  CREATININE 2.72* 2.55*  CALCIUM 7.8* 7.4*   PT/INR Recent Labs    07/28/21 2231 07/29/21 0345  LABPROT 14.9 16.6*  INR 1.2 1.4*    Studies/Results: DG Chest Port 1 View  Addendum Date: 07/30/2021   ADDENDUM REPORT: 07/30/2021 14:27 ADDENDUM: Voice recognition error in the impression. For the first sentence, the NG tube should be described as malpositioned, not well positioned. NG tube curls in the neck and does not extend into the thorax and will need to be replaced/repositioned. Electronically Signed   By: Lajean Manes M.D.   On:  07/30/2021 14:27   Result Date: 07/30/2021 CLINICAL DATA:  NG tube placement. EXAM: PORTABLE CHEST 1 VIEW COMPARISON:  07/29/2021. FINDINGS: NG tube is partly visualized, curled in the neck, presumably in the oropharynx. Cardiac silhouette normal in size. No mediastinal or hilar masses. Lungs demonstrate prominent bronchovascular markings, otherwise clear. No pleural effusion or pneumothorax. IMPRESSION: 1. NG tube well positioned, curled in the neck, presumably in the oropharynx. 2. No acute cardiopulmonary disease. Electronically Signed: By: Lajean Manes M.D. On: 07/30/2021 13:46   DG Abd 1 View  Result Date: 07/30/2021 CLINICAL DATA:  Gastric distension EXAM: ABDOMEN - 1 VIEW COMPARISON:  None Available. FINDINGS: The bowel gas pattern is nonobstructive. Mild gaseous distension of stomach and bowel. There are no abnormal calcifications overlying the kidneys. Pelvic phleboliths. Chronic right anterolateral tenth eleventh rib injuries. Degenerative changes of the spine with levoconvex lumbar curvature. IMPRESSION: No evidence of bowel obstruction. Electronically Signed   By: Maurine Simmering M.D.   On: 07/30/2021 11:33   MR ABDOMEN MRCP WO CONTRAST  Result Date: 07/29/2021 CLINICAL DATA:  Upper abdominal pain. Dilated common bile duct on ultrasound. EXAM: MRI ABDOMEN WITHOUT CONTRAST  (INCLUDING MRCP) TECHNIQUE: Multiplanar multisequence MR imaging of the abdomen was performed. Heavily T2-weighted images of the biliary and pancreatic ducts were obtained, and three-dimensional MRCP images were rendered by post processing. COMPARISON:  Ultrasound 07/29/2021 FINDINGS: Motion degradation exam. ICU patient. ICU patients have difficulty following breath holding commands. Lower chest:  Small bilateral pleural effusions Hepatobiliary: No intrahepatic biliary duct dilatation. Common bile duct normal caliber. The no gallstones identified. Small amount sludge within the gallbladder. No gallbladder distension or  inflammation. Pancreas: Normal pancreatic parenchymal intensity. No ductal dilatation or inflammation. Spleen: Normal spleen. Adrenals/urinary tract: Adrenal glands and kidneys are normal. Stomach/Bowel: Stomach and limited of the small bowel is unremarkable Vascular/Lymphatic: Abdominal aortic normal caliber. No retroperitoneal periportal lymphadenopathy. Musculoskeletal: No aggressive osseous lesion IMPRESSION: 1. Normal common bile duct. No intrahepatic biliary duct dilatation. 2. Small amount gallbladder sludge.  No evidence of cholecystitis. 3. Normal pancreas. 4. Bibasilar atelectasis. 5. Imaging degraded by respiratory motion. Electronically Signed   By: Suzy Bouchard M.D.   On: 07/29/2021 19:59   MR 3D Recon At Scanner  Result Date: 07/29/2021 CLINICAL DATA:  Upper abdominal pain. Dilated common bile duct on ultrasound. EXAM: MRI ABDOMEN WITHOUT CONTRAST  (INCLUDING MRCP) TECHNIQUE: Multiplanar multisequence MR imaging of the abdomen was performed. Heavily T2-weighted images of the biliary and pancreatic ducts were obtained, and three-dimensional MRCP images were rendered by post processing. COMPARISON:  Ultrasound 07/29/2021 FINDINGS: Motion degradation exam. ICU patient. ICU patients have difficulty following breath holding commands. Lower chest:  Small bilateral pleural effusions Hepatobiliary: No intrahepatic biliary duct dilatation. Common bile duct normal caliber. The no gallstones identified. Small amount sludge within the gallbladder. No gallbladder distension or inflammation. Pancreas: Normal pancreatic parenchymal intensity. No ductal dilatation or inflammation. Spleen: Normal spleen. Adrenals/urinary tract: Adrenal glands and kidneys are normal. Stomach/Bowel: Stomach and limited of the small bowel is unremarkable Vascular/Lymphatic: Abdominal aortic normal caliber. No retroperitoneal periportal lymphadenopathy. Musculoskeletal: No aggressive osseous lesion IMPRESSION: 1. Normal common bile  duct. No intrahepatic biliary duct dilatation. 2. Small amount gallbladder sludge.  No evidence of cholecystitis. 3. Normal pancreas. 4. Bibasilar atelectasis. 5. Imaging degraded by respiratory motion. Electronically Signed   By: Suzy Bouchard M.D.   On: 07/29/2021 19:59    Anti-infectives: Anti-infectives (From admission, onward)    Start     Dose/Rate Route Frequency Ordered Stop   07/31/21 2000  cefTRIAXone (ROCEPHIN) 2 g in sodium chloride 0.9 % 100 mL IVPB        2 g 200 mL/hr over 30 Minutes Intravenous Every 24 hours 07/31/21 1105     07/31/21 1000  ceFEPIme (MAXIPIME) 2 g in sodium chloride 0.9 % 100 mL IVPB  Status:  Discontinued        2 g 200 mL/hr over 30 Minutes Intravenous Every 24 hours 07/30/21 1035 07/31/21 1105   07/29/21 2200  ceFEPIme (MAXIPIME) 2 g in sodium chloride 0.9 % 100 mL IVPB  Status:  Discontinued        2 g 200 mL/hr over 30 Minutes Intravenous Every 24 hours 07/29/21 0102 07/29/21 1613   07/29/21 1800  ceFEPIme (MAXIPIME) 2 g in sodium chloride 0.9 % 100 mL IVPB  Status:  Discontinued        2 g 200 mL/hr over 30 Minutes Intravenous Every 12 hours 07/29/21 1613 07/30/21 1035   07/29/21 1200  metroNIDAZOLE (FLAGYL) IVPB 500 mg  Status:  Discontinued        500 mg 100 mL/hr over 60 Minutes Intravenous Every 12 hours 07/29/21 0044 07/31/21 1105   07/29/21 0903  vancomycin variable dose per unstable renal function (pharmacist dosing)  Status:  Discontinued         Does not apply See admin instructions 07/29/21 0903 07/29/21 1552   07/28/21 2345  vancomycin (VANCOREADY) IVPB 2000 mg/400 mL  Status:  Discontinued        2,000 mg 200 mL/hr over 120 Minutes Intravenous  Once 07/28/21 2334 07/29/21 0247   07/28/21 2300  ceFEPIme (MAXIPIME) 2 g in sodium chloride 0.9 % 100 mL IVPB        2 g 200 mL/hr over 30 Minutes Intravenous  Once 07/28/21 2245 07/28/21 2356   07/28/21 2300  metroNIDAZOLE (FLAGYL) IVPB 500 mg        500 mg 100 mL/hr over 60 Minutes  Intravenous  Once 07/28/21 2245 07/29/21 0147   07/28/21 2300  vancomycin (VANCOCIN) IVPB 1000 mg/200 mL premix  Status:  Discontinued        1,000 mg 200 mL/hr over 60 Minutes Intravenous  Once 07/28/21 2245 07/28/21 2333       Assessment/Plan: Patient with parkinson's, dementia and strep bacteremia of unknown etiology. Gallbladder is still considered.  IVF HD ok but tachycardic, no pressors at this time Septic with Strep bacteremia Antibiotics  HIDA ordered for tomorrow to rule out cholecystitis. Discussed that with everything he would not be a candidate for surgery right now and might need IR drain if did not keep improve with the antibiotics.  MRCP did not show choledocholithiasis or cholecystitis but will do HIDA to confirm   Updated mother and team. Radiology aware of the HIDA tomorrow so they can plan for the Nuclear contrast to be sent up.   Greater than 50% of the 35 minute visit was spent in counseling/ coordination of care regarding the plan for tomorrow for a HIDA and concern for the bacteremia etiology.    LOS: 2 days    Virl Cagey 07/31/2021

## 2021-08-01 ENCOUNTER — Inpatient Hospital Stay (HOSPITAL_COMMUNITY): Payer: PPO

## 2021-08-01 ENCOUNTER — Encounter (HOSPITAL_COMMUNITY): Payer: Self-pay | Admitting: Family Medicine

## 2021-08-01 DIAGNOSIS — K810 Acute cholecystitis: Secondary | ICD-10-CM | POA: Diagnosis present

## 2021-08-01 DIAGNOSIS — N179 Acute kidney failure, unspecified: Secondary | ICD-10-CM | POA: Diagnosis not present

## 2021-08-01 DIAGNOSIS — A419 Sepsis, unspecified organism: Secondary | ICD-10-CM | POA: Diagnosis not present

## 2021-08-01 DIAGNOSIS — G9341 Metabolic encephalopathy: Secondary | ICD-10-CM | POA: Diagnosis not present

## 2021-08-01 DIAGNOSIS — R6520 Severe sepsis without septic shock: Secondary | ICD-10-CM | POA: Diagnosis not present

## 2021-08-01 DIAGNOSIS — Z515 Encounter for palliative care: Secondary | ICD-10-CM

## 2021-08-01 DIAGNOSIS — Z7189 Other specified counseling: Secondary | ICD-10-CM | POA: Diagnosis not present

## 2021-08-01 DIAGNOSIS — R7881 Bacteremia: Secondary | ICD-10-CM | POA: Diagnosis not present

## 2021-08-01 DIAGNOSIS — J9601 Acute respiratory failure with hypoxia: Secondary | ICD-10-CM | POA: Diagnosis not present

## 2021-08-01 LAB — COMPREHENSIVE METABOLIC PANEL
ALT: 28 U/L (ref 0–44)
AST: 32 U/L (ref 15–41)
Albumin: 2 g/dL — ABNORMAL LOW (ref 3.5–5.0)
Alkaline Phosphatase: 169 U/L — ABNORMAL HIGH (ref 38–126)
Anion gap: 3 — ABNORMAL LOW (ref 5–15)
BUN: 85 mg/dL — ABNORMAL HIGH (ref 8–23)
CO2: 23 mmol/L (ref 22–32)
Calcium: 7.8 mg/dL — ABNORMAL LOW (ref 8.9–10.3)
Chloride: 125 mmol/L — ABNORMAL HIGH (ref 98–111)
Creatinine, Ser: 1.59 mg/dL — ABNORMAL HIGH (ref 0.61–1.24)
GFR, Estimated: 44 mL/min — ABNORMAL LOW (ref >60)
Glucose, Bld: 158 mg/dL — ABNORMAL HIGH (ref 70–99)
Potassium: 3.7 mmol/L (ref 3.5–5.1)
Sodium: 151 mmol/L — ABNORMAL HIGH (ref 135–145)
Total Bilirubin: 3.1 mg/dL — ABNORMAL HIGH (ref 0.3–1.2)
Total Protein: 5.2 g/dL — ABNORMAL LOW (ref 6.5–8.1)

## 2021-08-01 LAB — CBC
HCT: 34.2 % — ABNORMAL LOW (ref 39.0–52.0)
Hemoglobin: 11.7 g/dL — ABNORMAL LOW (ref 13.0–17.0)
MCH: 31.5 pg (ref 26.0–34.0)
MCHC: 34.2 g/dL (ref 30.0–36.0)
MCV: 92.2 fL (ref 80.0–100.0)
Platelets: 92 10*3/uL — ABNORMAL LOW (ref 150–400)
RBC: 3.71 MIL/uL — ABNORMAL LOW (ref 4.22–5.81)
RDW: 15.6 % — ABNORMAL HIGH (ref 11.5–15.5)
WBC: 16.7 10*3/uL — ABNORMAL HIGH (ref 4.0–10.5)
nRBC: 0 % (ref 0.0–0.2)

## 2021-08-01 LAB — GLUCOSE, CAPILLARY
Glucose-Capillary: 147 mg/dL — ABNORMAL HIGH (ref 70–99)
Glucose-Capillary: 148 mg/dL — ABNORMAL HIGH (ref 70–99)
Glucose-Capillary: 149 mg/dL — ABNORMAL HIGH (ref 70–99)

## 2021-08-01 MED ORDER — MORPHINE SULFATE (PF) 4 MG/ML IV SOLN
3.0000 mg | Freq: Once | INTRAVENOUS | Status: AC
  Administered 2021-08-01: 3 mg via INTRAVENOUS
  Filled 2021-08-01: qty 1

## 2021-08-01 MED ORDER — TECHNETIUM TC 99M MEBROFENIN IV KIT
5.0000 | PACK | Freq: Once | INTRAVENOUS | Status: AC | PRN
  Administered 2021-08-01: 5.5 via INTRAVENOUS

## 2021-08-01 NOTE — Plan of Care (Signed)
°  Problem: Respiratory: °Goal: Ability to maintain adequate ventilation will improve °Outcome: Progressing °  °

## 2021-08-01 NOTE — Consult Note (Signed)
Consultation Note Date: 08/01/2021   Patient Name: Elijah [name]  DOB: 1940-09-12  MRN: [id number]  Age / Sex: 81 y.o., male  PCP: Elijah Squibb, MD Referring Physician: Barton Dubois, MD  Reason for Consultation: Establishing goals of care  HPI/Patient Profile: 81 y.o. male  with past medical history of Parkinson's disease for 15 years with early (per wife) dementia, HTN/HLD, DM 2, OSA, chronic low back pain, history of biliary obstruction and pancreatitis with ERCP in 2018, gout, admitted on 07/28/2021 with severe sepsis present on admission, acute metabolic encephalopathy, group be strep bacteremia, AKI.   Clinical Assessment and Goals of Care: I have reviewed medical records including EPIC notes, labs and imaging, received report from RN, assessed the patient.  [name] is lying quietly in bed.  He appears acutely/chronically ill and quite frail.  He does not respond in any meaningful way to voice or touch.  He clearly cannot make his basic needs known.  His wife and daughter are present at bedside.  We meet at the bedside along with wife for 42 years, Vaughan Basta, and daughter Cecille Rubin, to discuss diagnosis prognosis, [place], EOL wishes, disposition and options. I introduced Palliative Medicine as specialized medical care for people living with serious illness. It focuses on providing relief from the symptoms and stress of a serious illness. The goal is to improve quality of life for both the patient and the family.  We discussed a brief life review of the patient.  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  Mr. and Mrs. Grape have been married for 57 years.  They share 4 children twin daughters Margarita Grizzle and Lattie Haw and sons Lanny Hurst and Lennette Bihari.  Vaughan Basta shares that Mr. Minotti had been diagnosed with Parkinson's for about 15 years.  She shares that  he is in the "early stages" of dementia.  She shares that he had been mowing the grass weekly, and up until a few weeks ago repairing tractors and lawnmowers.  She shares that he has been able to complete his ADLs until a month or 2 ago when he has needed assistance with dressing.  She shares that she has been assisting with bathing for the last few weeks.  She shares that his functional status has decreased greatly over the last 1 week.    We then focused on their current illness.  We talk in detail about [name] acute and chronic health concerns including, but not limited to, bacteremia and the treatment plan, selected labs and the treatment plan, surgery consult and recommendations, CT scans and further testing.  We talk about nutritional status, [name] is not awake enough to eat.  He was unable to have NG placed for nutrition.  I shared that we have to eat to live.  Daughter Margarita Grizzle asks about prognosis if a person cannot eat.  We talked about artificial hydration, but 10 days would not be unexpected without by mouth nutrition.  We  talked about the chronic illness pathway, what is normal and expected.  We talk about "meaningful" improvements.  The natural disease trajectory and expectations at EOL were discussed.  Advanced directives, concepts specific to code status, artifical feeding and hydration, and rehospitalization were considered and discussed.  We talked about the concept of "treat the treatable, but allowing natural passing".  We talked about how to make choices for loved ones including 1) keeping them at the center of decision-making 2) are we doing something for them or to them and 3) the person [name] was 5 years ago, how would that man tell them to care for him now.  Hospice and Palliative Care services outpatient were explained and offered.  Mrs. Erin Hearing and Margarita Grizzle asks about disposition.  I shared that [name] must have meaningful improvement before he is able to discharge  anywhere.  I shared that he must be able to have a safe diet and good by mouth intake before he is able to discharge for any rehab.  We talk about residential hospice if [name] is unable to show meaningful improvements.  Discussed the importance of continued conversation with family and the medical providers regarding overall plan of care and treatment options, ensuring decisions are within the context of the patient's values and GOCs.  Questions and concerns were addressed.  The family was encouraged to call with questions or concerns.  PMT will continue to support holistically.  Conference with attending, bedside nursing staff, surgery consult, transition of care team related to patient condition, needs, goals of care, disposition.   HCPOA  HCPOA -wife of 28 years, Vaughan Basta.  Mr. Mrs. Shellhammer share 4 children.  Daughters Cecille Rubin and Lattie Haw who are twins, sons Lanny Hurst and Lennette Bihari.    SUMMARY OF RECOMMENDATIONS   At this point continue full scope/full code Time for outcomes Awaiting result for HIDA scan for possible drain placement. PMT to follow-up regarding CODE STATUS   Code Status/Advance Care Planning: Full code -we talked about the concept of "treat the treatable, but allowing natural passing".  Symptom Management:  Per hospitalist, no additional needs at this time.  Palliative Prophylaxis:  Frequent Pain Assessment, Oral Care, and Turn Reposition  Additional Recommendations (Limitations, Scope, Preferences): Full Scope Treatment  Psycho-social/Spiritual:  Desire for further Chaplaincy support:yes Additional Recommendations: Caregiving  Support/Resources and Education on Hospice  Prognosis:  Unable to determine, based on outcomes.  Guarded at this point.  Discharge Planning:  To be determined, based on outcomes, family choice.       Primary Diagnoses: Present on Admission:  AKI (acute kidney injury) ([place])  Total bilirubin, elevated  Acute respiratory failure with hypoxia  (HCC)  Essential hypertension  Parkinson disease ([place])  Thrombocytopenia ([place])   I have reviewed the medical record, interviewed the patient and family, and examined the patient. The following aspects are pertinent.  Past Medical History:  Diagnosis Date   Chronic low back pain 03/24/2015   Degenerative arthritis    Diabetes mellitus without complication ([place])    Dyslipidemia    Glaucoma    Gout 10/07/2012   Hypertension    Low back pain    Obesity    OSA (obstructive sleep apnea) 04/10/2014   cannot tolerate, PCP aware.   Pancreatitis    Parkinson disease ([place])    Rotator cuff tear    Bilateral, no surgery   Social History   Socioeconomic History   Marital status: Married    Spouse name: Vaughan Basta   Number of children:  4   Years of education: college   Highest education level: Not on file  Occupational History   Occupation: Retired  Tobacco Use   Smoking status: Never   Smokeless tobacco: Never  Vaping Use   Vaping Use: Never used  Substance and Sexual Activity   Alcohol use: No   Drug use: No   Sexual activity: Not Currently    Birth control/protection: None  Other Topics Concern   Not on file  Social History Narrative   Patient is married Vaughan Basta) and lives at home with his wife.   Patient is left-handed.   Patient drinks one soda and 1 glass of tea with caffeine   Social Determinants of Health   Financial Resource Strain: Not on file  Food Insecurity: Not on file  Transportation Needs: Not on file  Physical Activity: Not on file  Stress: Not on file  Social Connections: Not on file   Family History  Problem Relation Age of Onset   Stroke Mother    Diabetes Mother    Heart Problems Mother    Parkinsonism Father    Heart Problems Father    Scheduled Meds:  aspirin  81 mg Oral Daily   budesonide (PULMICORT) nebulizer solution  0.5 mg Nebulization BID   Carbidopa-Levodopa ER  1 tablet Oral Q supper   Carbidopa-Levodopa ER  2 tablet Oral TID    Chlorhexidine Gluconate Cloth  6 each Topical Q0600   escitalopram  5 mg Oral Daily   metoprolol tartrate  2.5 mg Intravenous Q12H   OLANZapine  10 mg Oral QHS   pramipexole  0.75 mg Oral TID   QUEtiapine  25 mg Oral BID   QUEtiapine  50 mg Oral QHS   Continuous Infusions:  sodium chloride 50 mL/hr at 07/31/21 1826   cefTRIAXone (ROCEPHIN)  IV 2 g (07/31/21 2021)   PRN Meds:.acetaminophen **OR** acetaminophen, ipratropium-albuterol, labetalol, ondansetron **OR** ondansetron (ZOFRAN) IV Medications Prior to Admission:  Prior to Admission medications   Medication Sig Start Date End Date Taking? Authorizing Provider  aspirin 81 MG tablet Take 81 mg by mouth daily.   Yes [provider]  Azelastine HCl 0.15 % SOLN Place 1 spray into both nostrils daily as needed (congestion).  08/18/15  Yes [provider]  Carbidopa-Levodopa ER (SINEMET CR) 25-100 MG tablet controlled release TAKE TWO TABLETS BY MOUTH EVERY MORNING, TWO TABLETS AT LUNCH, ONE TABLET AT dinner, AND two TABLET AT BEDTIME Patient taking differently: Take 1-2 tablets by mouth in the morning, at noon, in the evening, and at bedtime. 2 tabs in the am, 2 tabs at lunch, 1 tab at dinner, AND 2 tabs at bedtime 06/27/21  Yes Ward Givens, NP  cyclobenzaprine (FLEXERIL) 10 MG tablet Take 10 mg by mouth as needed for muscle spasms.   Yes [provider]  escitalopram (LEXAPRO) 10 MG tablet Take 5 mg by mouth daily. 07/30/19  Yes [provider]  furosemide (LASIX) 20 MG tablet Take 20 mg by mouth as needed for fluid.   Yes [provider]  LORazepam (ATIVAN) 1 MG tablet Take 1 mg by mouth at bedtime. 07/11/21  Yes [provider]  meloxicam (MOBIC) 15 MG tablet Take 15 mg by mouth daily.   Yes [provider]  metFORMIN (GLUCOPHAGE) 500 MG tablet Take 500 mg by mouth daily with breakfast.  10/05/12  Yes [provider]  metoprolol tartrate (LOPRESSOR) 25 MG tablet Take 12.5  mg by mouth 2 (two) times daily.  Yes [provider]  OLANZapine (ZYPREXA) 10 MG tablet Take 1 tablet (10 mg total) by mouth at bedtime. 06/16/21  Yes White, Aaron Edelman A, NP  pantoprazole (PROTONIX) 40 MG tablet Take 40 mg by mouth daily. 03/08/14  Yes [provider]  potassium chloride SA (K-DUR,KLOR-CON) 20 MEQ tablet Take 20 mEq by mouth as needed (cramps). 10/24/13  Yes [provider]  pramipexole (MIRAPEX) 0.75 MG tablet TAKE ONE TABLET BY MOUTH THREE TIMES DAILY Patient taking differently: Take 0.75 mg by mouth 3 (three) times daily. 01/19/20  Yes Kathrynn Ducking, MD  QUEtiapine (SEROQUEL) 25 MG tablet TAKE ONE TABLET BY MOUTH AT 10am, 4pm, AND 2 tablets at 9pm daily AS DIRECTED Patient taking differently: Take 25-50 mg by mouth 3 (three) times daily. 1 tablet at 10am, 4pm, AND 2 tablets at 9pm daily 07/06/21  Yes White, Louanna Raw, NP  selegiline (ELDEPRYL) 5 MG tablet Take 5 mg by mouth 2 (two) times daily with a meal.   Yes [provider]  simvastatin (ZOCOR) 40 MG tablet Take 20 mg by mouth at bedtime.  09/05/12  Yes [provider]  valsartan-hydrochlorothiazide (DIOVAN-HCT) 80-12.5 MG tablet Take 1 tablet by mouth daily.   Yes [provider]   Allergies  Allergen Reactions   Aspirin Other (See Comments)    Stomach ulcers.   Coconut (Cocos Nucifera)     Other reaction(s): GI Upset (intolerance)   Penicillins Rash    Has patient had a PCN reaction causing immediate rash, facial/tongue/throat swelling, SOB or lightheadedness with hypotension: no Has patient had a PCN reaction causing severe rash involving mucus membranes or skin necrosis: No Has patient had a PCN reaction that required hospitalization No Has patient had a PCN reaction occurring within the last 10 years: No If all of the above answers are "NO", then may proceed with Cephalosporin use.    Review of Systems  Unable to perform ROS: Acuity of condition    Physical  Exam Vitals and nursing note reviewed.  Constitutional:      General: He is not in acute distress.    Appearance: He is ill-appearing.  Cardiovascular:     Rate and Rhythm: Normal rate.  Pulmonary:     Effort: Pulmonary effort is normal. No tachypnea.  Skin:    General: Skin is warm and dry.  Neurological:     Comments: Unable to answer questions.      Vital Signs: BP (!) 130/58   Pulse 79   Temp 98.2 F (36.8 C) (Axillary)   Resp 18   Ht '5\' 10"'$  (1.778 m)   Wt 98.2 kg   SpO2 98%   BMI 31.06 kg/m  Pain Scale: Faces   Pain Score: 0-No pain   SpO2: SpO2: 98 % O2 Device:SpO2: 98 % O2 Flow Rate: .O2 Flow Rate (L/min): 2 L/min  IO: Intake/output summary:  Intake/Output Summary (Last 24 hours) at 08/01/2021 1325 Last data filed at 08/01/2021 0740 Gross per 24 hour  Intake 126.36 ml  Output 1100 ml  Net -973.64 ml    LBM:   Baseline Weight: Weight: 90 kg Most recent weight: Weight:  (bed scale not functioning properly, unable to obtain pt weight.)     Palliative Assessment/Data:   Flowsheet Rows    Flowsheet Row Most Recent Value  Intake Tab   Referral Department Hospitalist  Unit at Time of Referral Intermediate Care Unit  Palliative Care Primary Diagnosis Sepsis/Infectious Disease  Date Notified 07/30/21  Palliative Care  Type New Palliative care  Reason for referral Clarify Goals of Care  Date of Admission 07/28/21  Date first seen by Palliative Care 08/01/21  # of days Palliative referral response time 2 Day(s)  # of days IP prior to Palliative referral 2  Clinical Assessment   Palliative Performance Scale Score 20%  Pain Max last 24 hours Not able to report  Pain Min Last 24 hours Not able to report  Dyspnea Max Last 24 Hours Not able to report  Dyspnea Min Last 24 hours Not able to report  Psychosocial & Spiritual Assessment   Palliative Care Outcomes        Time In: 1040 Time Out: 1155 Time Total: 75 minutes  Greater than 50%  of this time  was spent counseling and coordinating care related to the above assessment and plan.  Signed by: Drue Novel, NP   Please contact Palliative Medicine Team phone at [phone] for questions and concerns.  For individual provider: See Shea Evans

## 2021-08-01 NOTE — Progress Notes (Signed)
Progress Note   Patient: Elijah [name] MWU:[id number] DOB: 1940-04-04 DOA: 07/28/2021     3 DOS: the patient was seen and examined on 08/01/2021   Brief hospital course: As per H&P written by Dr.Zierle-Ghosh On 07/29/2021 [name] is a 81 y.o. male with medical history significant of history of chronic low back pain, diabetes mellitus type 2, hyperlipidemia, hypertension, Parkinson disease, obstructive sleep apnea, history of biliary obstruction and pancreatitis with ERCP in 2018, presents ED with chief complaint of dyspnea.  At time of presentation wife was at bedside.  She reported to the ED provider that patient has had 24 hours of shortness of breath and increased work of breathing.  He was recently seen in the ED 2 days ago for back pain, which was acute on chronic.  They had denied fevers at home, but wife did report the patient had left arm pain when she was trying to lift him up.  ED provider note reports that patient denied current abdominal pain.  Patient is not answering questions for me, but does have voluntary guarding with palpation of his abdomen.  Patient arrived in the ER satting in the low 90s on 4 L nasal cannula.  He was put on nonrebreather for work of breathing.  Sepsis protocol was started.  Patient was treated with vancomycin, cefepime, Flagyl, and given a 3 L bolus.  Lactic acid was significantly elevated 6.3, repeat 4.6.  Chest x-ray was negative for acute disease, CT abdomen pelvis did not given definitive explanation of his symptoms either.  Urine was borderline.  Patient did have an AKI.  Admission was requested for further work-up of altered mental status, dyspnea, and most likely sepsis.  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 -Creatinine normally in the 0.6-0.8 range at baseline; at time of admission 2.24 and yesterday 2.55. CMET pending today. -Most likely in the  setting of infection process and prerenal azotemia.  -Continue to maintain adequate hydration while following renal function trend. -Avoid hypotension and minimize the use of nephrotoxic agents.    Thrombocytopenia ([place]) -Most likely in the setting of sepsis -Platelets count have trended down into the 90sK -Holding heparin products -SCDs for DVT prophylaxis. -Follow platelet count -No operative bleeding.  Hyperbilirubinemia -OBSTRUCTION not appreciated on MRCP. -Continue to follow trend -Maintain adequate hydration -GI and general surgery on board will follow recommendations. -HIDA scan ordered and suggesting positive cholecystitis and possible cystic duct obstruction.   Bacteremia -Strep B bacteremia appreciated -Continue treatment with IV antibiotics; IV Rocephin has been initiated following sensitivity and cultures results.   Acute metabolic encephalopathy -Baseline is unclear, and history was taken mostly by wife and by ED provider. -Patient with underlying history of mood disorder/dementia and Parkinson. -Continue to be very obtunded/lethargic today. -Noticed blood urea 105 on 6/10 (probably contributing). -CT head, ammonia and ABG within normal limits and not explaining current encephalopathy process.  -Patient is not following commands, eating or drinking. -Most likely secondary to metabolic encephalopathy and sepsis -Treatment as already mentioned. -prognosis is guarded -Palliative care consulted.  HLD (hyperlipidemia) -Patient mentation avoiding him to be able to take oral medication -Holding statin for now.  Lactic acidosis -Secondary to sepsis -Initial lactic acid 6.3, repeat 4.6>> now 1.0 -Continue to maintain adequate IV hydration -follow response and ability to take PO's to maintain adequate hydration.   Acute respiratory failure with hypoxia (HCC) -Patient with good saturation  currently on 4-5 L. -Patient ended increasing work of breathing to the  point of requiring transient use of BiPAP at time of admission. -Continue current IV antibiotics as he had chest x-ray findings suggesting pneumonia on the right side.   -Patient continues to be tachypneic intermittently. -No chest pain appreciated.  Severe sepsis (HCC) -Heart rate 100, respiratory rate 30-44, lactic acidosis 6.3, AKI 2.24 -Possible intra-abdominal infection versus aspiration pneumonia; patient blood culture positive for strep B -Continue IV Rocephin based on sensitivity results for strep B. -Continue to maintain adequate hydration. -WBC's trending up. -Follow resolution of sepsis physiology. -Off BiPAP and with good saturation demonstrated on 4-5 L [place] supplementation with great saturation. -COVID and flu are negative    Gallstones -No gallstones has been seen at this point -Continue n.p.o. status -Continue IV antibiotics (using Rocephin). -HIDA scan recommended by general surgery; results with concerns for cystic duct obstruction and cholecystitis.  -No surgical candidate at this time; but if needed will qualify for cholecystostomy; will discussed with general surgery and IR...    Total bilirubin, elevated -CMET pending today -Patient does have history of biliary obstruction and ERCP back in 2018 -CT scan shows bilateral inguinal hernias, herniated bladder on the right, small nonobstructing stone, and gallstones without complication -Vancomycin has been discontinued. -MRCP has rule out retained stones or any masses in his liver. -Continue to maintain adequate hydration -LFTs has continued trending down overall. -Continue IV antibiotics using Rocephin based on cultures/sensitivity results. -After discussing with general surgery will follow results of HIDA scan. -No endoscopic interventions needed at this point.  Essential hypertension -Continue holding ARB and  HCTZ due to AKI -Blood pressure has now stabilized; given the presence of irregular heart rate, will  continue the use of low-dose Lopressor IV.   Diabetes mellitus without complication ([name]) -Continue holding metformin -Patient currently n.p.o. -Will continue to monitor glucose with CBGs and if it starts trending up will add insulin coverage. -so far CBG's are stable.  Parkinson disease ([place]) -Continue patient's Sinemet when able to take by mouth. -Patient with underlying mood disorder and mild hallucinations/psychosis as per patient's wife reports. -Too lethargic to safely take oral meds currently. -Unable to place NG tube on 07/30/21.   Subjective:  Afebrile, no nausea, no vomiting. [place] with ongoing encephalopathic process, not following commands, not eating or safely able to take PO's. 4-5 L [place] in place.  Physical Exam: Vitals:   08/01/21 1034 08/01/21 1046 08/01/21 1100 08/01/21 1115  BP: (!) 137/59  (!) 140/57   Pulse: 94 98 85 89  Resp: 13 (!) 21 (!) 23 20  Temp:      TempSrc:      SpO2: 96% 94% 93% 97%  Weight:      Height:       General exam: remains lethargic/encephalopathic and not following commands. No nausea, no vomiting, no fever.  Respiratory system: good air movement appreciated, positive rhonchi, no wheezing, no frank crackles. 4-5 L in place. Cardiovascular system:rate controlled, not rubs, no gallops, no JVD. Gastrointestinal system: Abdomen is distended, no guarding, positive BS, no masses/ and nontender. Central nervous system: unable to properly assess due to ongoing encephalopathy. Extremities: No cyanosis, no clubbing.  Skin: No petechiae.  Psychiatry: unable to assess with current encephalopathy.   Data Reviewed: CBC: WBCs 16.7, hemoglobin 11.7, platelets count 92 K Comprehensive metabolic panel: Sodium 161, potassium 3.7, BUN 85, creatinine 1.59; AST 32, ALT 28, alkaline phosphatase 169 and total bilirubin 3.1. CBG's: in the 140  range. Lipase 13 Lactic acid 1.0  Family Communication: No family at bedside.  Disposition: Status is:  Inpatient Remains inpatient appropriate because: Still with ongoing abnormal mental status changes, presence of strep B bacteremia with concerns for infection coming from biliary tract/gallbladder. WBC's trending up. Continue to require IV antibiotics. Renal function/BUN has worsen and patient is too encephalopathic to take oral meds/nutrition. Prognosis is guarded. Palliative care consulted.   Planned Discharge Destination: To be determined.   Author: Barton Dubois, MD 08/01/2021 11:39 AM  For on call review [email or web address].

## 2021-08-01 NOTE — Progress Notes (Signed)
SLP Cancellation Note  Patient Details Name: Elijah [name] MRN: [id number] DOB: [birth year]   Cancelled treatment:       Reason Eval/Treat Not Completed: Fatigue/lethargy limiting ability to participate;Patient's level of consciousness;Patient not medically ready; Pt moans, but did not rouse to auditory and tactile SLP cues. Pt's wife indicates limited response to her today as well. SLP will check back tomorrow.  Thank you,  Genene Churn, [place]    [place] 08/01/2021, 4:27 PM

## 2021-08-01 NOTE — Progress Notes (Signed)
Bipap not needed at this time.  Patient is 98% on 2L with no distress noted at this time.

## 2021-08-01 NOTE — Progress Notes (Signed)
[facility] Progress Note     Subjective: HIDA ordered this am to see if acute cholecystitis. Confirmed.   Objective: Vital signs in last 24 hours: Temp:  [98.2 F (36.8 C)-99.6 F (37.6 C)] 98.2 F (36.8 C) (06/12 1200) Pulse Rate:  [62-115] 79 (06/12 1300) Resp:  [13-25] 18 (06/12 1300) BP: (105-149)/(50-65) 130/58 (06/12 1300) SpO2:  [93 %-100 %] 98 % (06/12 1300)    Intake/Output from previous day: 06/11 0701 - 06/12 0700 In: 387 [P.O.:30; I.V.:96.4; IV Piggyback:260.6] Out: 900 [Urine:900] Intake/Output this shift: Total I/O In: -  Out: 1100 [Urine:1100]  General appearance: sleeping, does not wake up GI: soft, distended, grimace with palpation  Lab Results:  Recent Labs    07/31/21 0330 08/01/21 0222  WBC 12.8* 16.7*  HGB 10.3* 11.7*  HCT 29.6* 34.2*  PLT 72* 92*   BMET Recent Labs    07/31/21 0330 08/01/21 1434  NA 140 151*  K 3.6 3.7  CL 113* 125*  CO2 18* 23  GLUCOSE 140* 158*  BUN 104* 85*  CREATININE 2.55* 1.59*  CALCIUM 7.4* 7.8*   PT/INR No results for input(s): "LABPROT", "INR" in the last 72 hours.  Studies/Results: NM Hepatobiliary Liver Func  Result Date: 08/01/2021 CLINICAL DATA:  Concern for cholecystitis. EXAM: NUCLEAR MEDICINE HEPATOBILIARY IMAGING TECHNIQUE: Sequential images of the abdomen were obtained out to 60 minutes following intravenous administration of radiopharmaceutical. 3 mm IV morphine administered to augment filling of the gallbladder. RADIOPHARMACEUTICALS:  5.5 mCi Tc-76m Choletec IV COMPARISON:  MRI 07/29/2021 FINDINGS: Uniform accumulation radiotracer within the liver. Counts are evident within the small bowel by 20 minutes. The gallbladder fails to fill at 60 minutes. IV morphine was administered to augment filling of the gallbladder. Post morphine injection, the gallbladder fails to fill. IMPRESSION: 1. Non filling of the gallbladder is consistent with obstruction of cystic duct / acute  cholecystitis. 2. Patent common bile duct. These results will be called to the ordering clinician or representative by the Radiologist Assistant, and communication documented in the PACS or [employer] Electronically Signed   By: SSuzy BouchardM.D.   On: 08/01/2021 11:52   CT HEAD WO CONTRAST (5MM)  Result Date: 07/31/2021 CLINICAL DATA:  Altered mental status EXAM: CT HEAD WITHOUT CONTRAST TECHNIQUE: Contiguous axial images were obtained from the base of the skull through the vertex without intravenous contrast. RADIATION DOSE REDUCTION: This exam was performed according to the departmental dose-optimization program which includes automated exposure control, adjustment of the mA and/or kV according to patient size and/or use of iterative reconstruction technique. COMPARISON:  03/19/2021 FINDINGS: Brain: No acute intracranial findings are seen. There are no signs of bleeding within the cranium. Cortical sulci are prominent more so in the left cerebral hemisphere. There is no focal effacement of cortical sulci. Vascular: Unremarkable. Skull: Unremarkable. Sinuses/Orbits: There is mucosal thickening in the ethmoid sinus. Other: None. IMPRESSION: No acute intracranial findings are seen in noncontrast CT brain. Atrophy. Electronically Signed   By: PElmer PickerM.D.   On: 07/31/2021 14:40    Anti-infectives: Anti-infectives (From admission, onward)    Start     Dose/Rate Route Frequency Ordered Stop   07/31/21 2000  cefTRIAXone (ROCEPHIN) 2 g in sodium chloride 0.9 % 100 mL IVPB        2 g 200 mL/hr over 30 Minutes Intravenous Every 24 hours 07/31/21 1105     07/31/21 1000  ceFEPIme (MAXIPIME) 2 g in sodium chloride 0.9 % 100 mL IVPB  Status:  Discontinued        2 g 200 mL/hr over 30 Minutes Intravenous Every 24 hours 07/30/21 1035 07/31/21 1105   07/29/21 2200  ceFEPIme (MAXIPIME) 2 g in sodium chloride 0.9 % 100 mL IVPB  Status:  Discontinued        2 g 200 mL/hr over 30 Minutes  Intravenous Every 24 hours 07/29/21 0102 07/29/21 1613   07/29/21 1800  ceFEPIme (MAXIPIME) 2 g in sodium chloride 0.9 % 100 mL IVPB  Status:  Discontinued        2 g 200 mL/hr over 30 Minutes Intravenous Every 12 hours 07/29/21 1613 07/30/21 1035   07/29/21 1200  metroNIDAZOLE (FLAGYL) IVPB 500 mg  Status:  Discontinued        500 mg 100 mL/hr over 60 Minutes Intravenous Every 12 hours 07/29/21 0044 07/31/21 1105   07/29/21 0903  vancomycin variable dose per unstable renal function (pharmacist dosing)  Status:  Discontinued         Does not apply See admin instructions 07/29/21 0903 07/29/21 1552   07/28/21 2345  vancomycin (VANCOREADY) IVPB 2000 mg/400 mL  Status:  Discontinued        2,000 mg 200 mL/hr over 120 Minutes Intravenous  Once 07/28/21 2334 07/29/21 0247   07/28/21 2300  ceFEPIme (MAXIPIME) 2 g in sodium chloride 0.9 % 100 mL IVPB        2 g 200 mL/hr over 30 Minutes Intravenous  Once 07/28/21 2245 07/28/21 2356   07/28/21 2300  metroNIDAZOLE (FLAGYL) IVPB 500 mg        500 mg 100 mL/hr over 60 Minutes Intravenous  Once 07/28/21 2245 07/29/21 0147   07/28/21 2300  vancomycin (VANCOCIN) IVPB 1000 mg/200 mL premix  Status:  Discontinued        1,000 mg 200 mL/hr over 60 Minutes Intravenous  Once 07/28/21 2245 07/28/21 2333       Assessment/Plan: Patient with acute cholecystitis confirmed on HIDA and strep bacteremia. Discussed with this wife and daughters treatment with antibiotics and possibly need for IR drain given the worsening leukocytosis and encephalopathy. Discussed that this could be permanent, that it would occur in [place] and he would come back, discussed that they do not have to pick this and that they can go with more comfort measures too based on his baseline health etc.   Family deciding about IR drain or not Palliative discussing options too  Antibiotics for acute cholecystitis and strep bacteremia    LOS: 3 days    Virl Cagey 08/01/2021

## 2021-08-01 NOTE — TOC Initial Note (Signed)
Transition of Care [facility]) - Initial/Assessment Note    Patient Details  Name: Elijah [name] MRN: [id number] Date of Birth: 06-15-1940  Transition of Care [facility]) CM/SW Contact:    Ihor Gully, LCSW Phone Number: 08/01/2021, 2:19 PM  Clinical Narrative:                 Patient from home with spouse. Uses walker. Has wc, bsc in the home. Wife assists with ADLs. Does not drive, wife transports to appointments.  Expected Discharge Plan: [place] Barriers to Discharge: Continued Medical Work up   Patient Goals and CMS Choice        Expected Discharge Plan and Services Expected Discharge Plan: [place]                                              Prior Living Arrangements/Services   Lives with:: Spouse Patient language and need for interpreter reviewed:: Yes Do you feel safe going back to the place where you live?: Yes      Need for Family Participation in Patient Care: Yes (Comment) Care giver support system in place?: Yes (comment) Current home services: DME Criminal Activity/Legal Involvement Pertinent to Current Situation/Hospitalization: No - Comment as needed  Activities of Daily Living Home Assistive Devices/Equipment: Environmental consultant (specify type), Shower chair with back ADL Screening (condition at time of admission) Patient's cognitive ability adequate to safely complete daily activities?: No Is the patient deaf or have difficulty hearing?: No Does the patient have difficulty seeing, even when wearing glasses/contacts?: No Does the patient have difficulty concentrating, remembering, or making decisions?: Yes Patient able to express need for assistance with ADLs?: No Does the patient have difficulty dressing or bathing?: Yes Independently performs ADLs?: No Dressing (OT): Needs assistance Grooming: Needs assistance Does the patient have difficulty walking or climbing stairs?: Yes Weakness of Legs: Both Weakness of  Arms/Hands: Both  Permission Sought/Granted Permission sought to share information with : Family Supports    Share Information with NAME: Elijah [name]           Emotional Assessment         Alcohol / Substance Use: Not Applicable Psych Involvement: No (comment)  Admission diagnosis:  AKI (acute kidney injury) ([place]) [N17.9] Acute kidney injury ([place]) [N17.9] Severe sepsis ([place]) [A41.9, R65.20] Patient Active Problem List   Diagnosis Date Noted   Thrombocytopenia ([place]) 07/31/2021   Hyperbilirubinemia    AKI (acute kidney injury) ([place]) 07/29/2021   Severe sepsis ([place]) 07/29/2021   Acute respiratory failure with hypoxia ([facility]) 07/29/2021   Lactic acidosis 07/29/2021   HLD (hyperlipidemia) 29/92/4268   Acute metabolic encephalopathy 34/19/6222   Bacteremia    S/P ERCP    Biliary obstruction    Pancreatitis, acute    Choledocholithiasis    Total bilirubin, elevated    Gallstones    Elevated LFTs    Pancreatitis 09/02/2015   Parkinson disease ([place])    Diabetes mellitus without complication ([place])    Hypertension    Essential hypertension    Jaundice    Chronic low back pain 03/24/2015   OSA (obstructive sleep apnea) 04/10/2014   Paralysis agitans ([place]) 10/07/2012   PCP:  Celene Squibb, MD Pharmacy:   [facility] - [place], [place] - [address] [place]  [address] Phone: [phone] Fax: [phone]  [facility] - [place], [place] - [address] [place] [address] Phone: [phone] Fax: [phone]     Social Determinants of Health (SDOH) Interventions    Readmission Risk Interventions     No data to display

## 2021-08-02 ENCOUNTER — Ambulatory Visit (HOSPITAL_COMMUNITY)
Admission: RE | Admit: 2021-08-02 | Discharge: 2021-08-02 | Disposition: A | Discharge status: Home / Self Care | Payer: PPO | Source: Ambulatory Visit | Attending: Internal Medicine | Admitting: Internal Medicine

## 2021-08-02 DIAGNOSIS — G9341 Metabolic encephalopathy: Secondary | ICD-10-CM | POA: Diagnosis not present

## 2021-08-02 DIAGNOSIS — Z434 Encounter for attention to other artificial openings of digestive tract: Secondary | ICD-10-CM | POA: Diagnosis not present

## 2021-08-02 DIAGNOSIS — A419 Sepsis, unspecified organism: Secondary | ICD-10-CM | POA: Diagnosis not present

## 2021-08-02 DIAGNOSIS — Z515 Encounter for palliative care: Secondary | ICD-10-CM | POA: Diagnosis not present

## 2021-08-02 DIAGNOSIS — E870 Hyperosmolality and hypernatremia: Secondary | ICD-10-CM | POA: Diagnosis present

## 2021-08-02 DIAGNOSIS — Z7189 Other specified counseling: Secondary | ICD-10-CM | POA: Diagnosis not present

## 2021-08-02 DIAGNOSIS — K810 Acute cholecystitis: Secondary | ICD-10-CM | POA: Insufficient documentation

## 2021-08-02 DIAGNOSIS — N179 Acute kidney failure, unspecified: Secondary | ICD-10-CM | POA: Diagnosis not present

## 2021-08-02 DIAGNOSIS — R7881 Bacteremia: Secondary | ICD-10-CM | POA: Diagnosis not present

## 2021-08-02 DIAGNOSIS — J9601 Acute respiratory failure with hypoxia: Secondary | ICD-10-CM | POA: Diagnosis not present

## 2021-08-02 HISTORY — PX: IR PERC CHOLECYSTOSTOMY: IMG2326

## 2021-08-02 MED ORDER — SODIUM CHLORIDE 0.9% FLUSH
5.0000 mL | Freq: Three times a day (TID) | INTRAVENOUS | Status: DC
  Administered 2021-08-02 – 2021-08-03 (×3): 5 mL

## 2021-08-02 MED ORDER — LIDOCAINE HCL 1 % IJ SOLN
INTRAMUSCULAR | Status: AC
  Filled 2021-08-02: qty 20

## 2021-08-02 MED ORDER — LEVOFLOXACIN IN D5W 500 MG/100ML IV SOLN
500.0000 mg | Freq: Once | INTRAVENOUS | Status: AC
Start: 2021-08-02 — End: 2021-08-02
  Administered 2021-08-02: 500 mg via INTRAVENOUS
  Filled 2021-08-02 (×2): qty 100

## 2021-08-02 MED ORDER — MIDAZOLAM HCL 2 MG/2ML IJ SOLN
INTRAMUSCULAR | Status: AC | PRN
  Administered 2021-08-02: .5 mg via INTRAVENOUS

## 2021-08-02 MED ORDER — FENTANYL CITRATE (PF) 100 MCG/2ML IJ SOLN
INTRAMUSCULAR | Status: AC
  Filled 2021-08-02: qty 2

## 2021-08-02 MED ORDER — IOHEXOL 300 MG/ML  SOLN
100.0000 mL | Freq: Once | INTRAMUSCULAR | Status: AC | PRN
  Administered 2021-08-02: 5 mL

## 2021-08-02 MED ORDER — MIDAZOLAM HCL 2 MG/2ML IJ SOLN
INTRAMUSCULAR | Status: AC
  Filled 2021-08-02: qty 2

## 2021-08-02 MED ORDER — HYDROMORPHONE HCL 1 MG/ML IJ SOLN
1.0000 mg | INTRAMUSCULAR | Status: DC | PRN
  Administered 2021-08-02 – 2021-08-05 (×14): 1 mg via INTRAVENOUS
  Filled 2021-08-02 (×15): qty 1

## 2021-08-02 MED ORDER — LIDOCAINE HCL 1 % IJ SOLN
INTRAMUSCULAR | Status: AC | PRN
  Administered 2021-08-02: 10 mL via INTRADERMAL

## 2021-08-02 MED ORDER — MORPHINE SULFATE (PF) 2 MG/ML IV SOLN
2.0000 mg | INTRAVENOUS | Status: DC | PRN
  Administered 2021-08-02 (×4): 2 mg via INTRAVENOUS
  Filled 2021-08-02 (×4): qty 1

## 2021-08-02 MED ORDER — DEXTROSE-NACL 5-0.45 % IV SOLN: INTRAVENOUS | Status: DC

## 2021-08-02 MED ORDER — FENTANYL CITRATE (PF) 100 MCG/2ML IJ SOLN
INTRAMUSCULAR | Status: AC | PRN
  Administered 2021-08-02: 25 ug via INTRAVENOUS

## 2021-08-02 MED ORDER — MEPERIDINE HCL 25 MG/ML IJ SOLN
INTRAMUSCULAR | Status: AC
  Filled 2021-08-02: qty 1

## 2021-08-02 NOTE — Consult Note (Signed)
Chief Complaint: Patient was seen in consultation today for cholecystitis at the request of [facility]  Referring Physician(s): Blake Divine, MD  Supervising Physician: Corrie Mckusick  Patient Status: [facility]  History of Present Illness: Elijah [name] is a 81 y.o. 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  Elijah [name] has been transferred from [facility] for placement of perc chole tube.  This was discussed with wife who gives consent.  Past Medical History:  Diagnosis Date   Chronic low back pain 03/24/2015   Degenerative arthritis    Diabetes mellitus without complication ([place])    Dyslipidemia    Glaucoma    Gout 10/07/2012   Hypertension    Low back pain    Obesity    OSA (obstructive sleep apnea) 04/10/2014   cannot tolerate, PCP aware.   Pancreatitis    Parkinson disease ([place])    Rotator cuff tear    Bilateral, no surgery    Past Surgical History:  Procedure Laterality Date   AMPUTATION Right 01/31/2018   Procedure: AMPUTATION RIGHT THIRD TOE;  Surgeon: Caprice Beaver, DPM;  Location: [facility];  Service: Podiatry;  Laterality: Right;   APPENDECTOMY     BALLOON DILATION N/A 09/06/2015   Procedure: BALLOON DILATION;  Surgeon: Daneil Dolin, MD;  Location: [facility];  Service: Endoscopy;  Laterality: N/A;   BILIARY STENT PLACEMENT N/A 09/08/2015   Procedure: BILIARY STENT PLACEMENT;  Surgeon: Daneil Dolin, MD;  Location: [facility];  Service: Endoscopy;  Laterality: N/A;   ERCP N/A 09/06/2015   Procedure: ENDOSCOPIC RETROGRADE CHOLANGIOPANCREATOGRAPHY (ERCP);  Surgeon: Daneil Dolin, MD;  Location: [facility];  Service:  Endoscopy;  Laterality: N/A;   ERCP N/A 09/08/2015   Procedure: ENDOSCOPIC RETROGRADE CHOLANGIOPANCREATOGRAPHY (ERCP);  Surgeon: Daneil Dolin, MD;  Location: [facility];  Service: Endoscopy;  Laterality: N/A;  with bile duct brushings   HERNIA REPAIR     umbilical   SHOULDER BONE SPUR Left    RESECTION   SPHINCTEROTOMY N/A 09/06/2015   Procedure: SPHINCTEROTOMY;  Surgeon: Daneil Dolin, MD;  Location: [facility];  Service: Endoscopy;  Laterality: N/A;   TONSILLECTOMY      Allergies: Aspirin, Coconut (cocos nucifera), and Penicillins  Medications: Prior to Admission medications   Medication Sig Start Date End Date Taking? Authorizing Provider  aspirin 81 MG tablet Take 81 mg by mouth daily.   Yes [provider]  Azelastine HCl 0.15 % SOLN Place 1 spray into both nostrils daily as needed (congestion).  08/18/15  Yes [provider]  Carbidopa-Levodopa ER (SINEMET CR) 25-100 MG tablet controlled release TAKE TWO TABLETS BY MOUTH EVERY MORNING, TWO TABLETS AT LUNCH, ONE TABLET AT dinner, AND two TABLET AT BEDTIME Patient taking differently: Take 1-2 tablets by mouth in the morning, at noon, in the evening, and at bedtime. 2 tabs in the am, 2 tabs at lunch, 1 tab at dinner, AND 2 tabs at bedtime 06/27/21  Yes Ward Givens, NP  cyclobenzaprine (FLEXERIL) 10 MG tablet Take 10 mg by mouth as needed for muscle spasms.   Yes [provider]  escitalopram (LEXAPRO) 10 MG tablet Take 5 mg by mouth daily. 07/30/19  Yes [provider]  furosemide (LASIX) 20 MG tablet Take 20 mg by mouth as needed for fluid.   Yes [provider]  LORazepam (ATIVAN) 1 MG tablet Take 1 mg by mouth at bedtime. 07/11/21  Yes [provider]  meloxicam (MOBIC) 15 MG tablet Take 15 mg by mouth daily.   Yes [provider]  metFORMIN (GLUCOPHAGE) 500 MG tablet Take 500 mg by mouth daily with breakfast.  10/05/12  Yes [provider]  metoprolol  tartrate (LOPRESSOR) 25 MG tablet Take 12.5 mg by mouth 2 (two) times daily.   Yes [provider]  OLANZapine (ZYPREXA) 10 MG tablet Take 1 tablet (10 mg total) by mouth at bedtime. 06/16/21  Yes White, Aaron Edelman A, NP  pantoprazole (PROTONIX) 40 MG tablet Take 40 mg by mouth daily. 03/08/14  Yes [provider]  potassium chloride SA (K-DUR,KLOR-CON) 20 MEQ tablet Take 20 mEq by mouth as needed (cramps). 10/24/13  Yes [provider]  pramipexole (MIRAPEX) 0.75 MG tablet TAKE ONE TABLET BY MOUTH THREE TIMES DAILY Patient taking differently: Take 0.75 mg by mouth 3 (three) times daily. 01/19/20  Yes Kathrynn Ducking, MD  QUEtiapine (SEROQUEL) 25 MG tablet TAKE ONE TABLET BY MOUTH AT 10am, 4pm, AND 2 tablets at 9pm daily AS DIRECTED Patient taking differently: Take 25-50 mg by mouth 3 (three) times daily. 1 tablet at 10am, 4pm, AND 2 tablets at 9pm daily 07/06/21  Yes White, Louanna Raw, NP  selegiline (ELDEPRYL) 5 MG tablet Take 5 mg by mouth 2 (two) times daily with a meal.   Yes [provider]  simvastatin (ZOCOR) 40 MG tablet Take 20 mg by mouth at bedtime.  09/05/12  Yes [provider]  valsartan-hydrochlorothiazide (DIOVAN-HCT) 80-12.5 MG tablet Take 1 tablet by mouth daily.   Yes [provider]     Family History  Problem Relation Age of Onset   Stroke Mother    Diabetes Mother    Heart Problems Mother    Parkinsonism Father    Heart Problems Father     Social History   Socioeconomic History   Marital status: Married    Spouse name: Vaughan Basta   Number of children: 4   Years of education: college   Highest education level: Not on file  Occupational History   Occupation: Retired  Tobacco Use   Smoking status: Never   Smokeless tobacco: Never  Vaping Use   Vaping Use: Never used  Substance and Sexual Activity   Alcohol use: No   Drug use: No   Sexual activity: Not Currently    Birth control/protection: None  Other Topics Concern    Not on file  Social History Narrative   Patient is married Vaughan Basta) and lives at home with his wife.   Patient is left-handed.   Patient drinks one soda and 1 glass of tea with caffeine   Social Determinants of Health   Financial Resource Strain: Not on file  Food Insecurity: Not on file  Transportation Needs: Not on file  Physical Activity: Not on file  Stress: Not on file  Social Connections: Not on file    Review of Systems:  unable to obtain due to clinical status  Vital Signs: BP (!) 149/82   Pulse 89   Temp 99.5 F (37.5 C) (Axillary)   Resp 19   Ht '5\' 10"'$  (1.778 m)   Wt 213 lb 6.5 oz (96.8 kg)   SpO2 97%   BMI 30.62 kg/m  Physical Exam Constitutional:      Appearance: He is ill-appearing.  Cardiovascular:     Rate and Rhythm: Normal rate.     Pulses: Normal pulses.  Pulmonary:     Effort: Pulmonary effort is normal. No respiratory distress.     Breath sounds: Normal breath sounds.  Abdominal:     Palpations: Abdomen is soft.  Skin:    Coloration: Skin is pale.     Findings: Bruising present.  Neurological:     Comments: Moans with each breath.  Does not respond to name.  Startles to physical touch, but does not wake to speak/communicate.     Imaging: NM Hepatobiliary Liver Func  Result Date: 08/01/2021 CLINICAL DATA:  Concern for cholecystitis. EXAM: NUCLEAR MEDICINE HEPATOBILIARY IMAGING TECHNIQUE: Sequential images of the abdomen were obtained out to 60 minutes following intravenous administration of radiopharmaceutical. 3 mm IV morphine administered to augment filling of the gallbladder. RADIOPHARMACEUTICALS:  5.5 mCi Tc-76m Choletec IV COMPARISON:  MRI 07/29/2021 FINDINGS: Uniform accumulation radiotracer within the liver. Counts are evident within the small bowel by 20 minutes. The gallbladder fails to fill at 60 minutes. IV morphine was administered to augment filling of the gallbladder. Post morphine injection, the gallbladder fails to fill.  IMPRESSION: 1. Non filling of the gallbladder is consistent with obstruction of cystic duct / acute cholecystitis. 2. Patent common bile duct. These results will be called to the ordering clinician or representative by the Radiologist Assistant, and communication documented in the PACS or [employer] Electronically Signed   By: SSuzy BouchardM.D.   On: 08/01/2021 11:52   CT HEAD WO CONTRAST (5MM)  Result Date: 07/31/2021 CLINICAL DATA:  Altered mental status EXAM: CT HEAD WITHOUT CONTRAST TECHNIQUE: Contiguous axial images were obtained from the base of the skull through the vertex without intravenous contrast. RADIATION DOSE REDUCTION: This exam was performed according to the departmental dose-optimization program which includes automated exposure control, adjustment of the mA and/or kV according to patient size and/or use of iterative reconstruction technique. COMPARISON:  03/19/2021 FINDINGS: Brain: No acute intracranial findings are seen. There are no signs of bleeding within the cranium. Cortical sulci are prominent more so in the left cerebral hemisphere. There is no focal effacement of cortical sulci. Vascular: Unremarkable. Skull: Unremarkable. Sinuses/Orbits: There is mucosal thickening in the ethmoid sinus. Other: None. IMPRESSION: No acute intracranial findings are seen in noncontrast CT brain. Atrophy. Electronically Signed   By: PElmer PickerM.D.   On: 07/31/2021 14:40   DG Chest Port 1 View  Addendum Date: 07/30/2021   ADDENDUM REPORT: 07/30/2021 14:27 ADDENDUM: Voice recognition error in the impression. For the first sentence, the NG tube should be described as malpositioned, not well positioned. NG tube curls in the neck and does not extend into the thorax and will need to be replaced/repositioned. Electronically Signed   By: DLajean ManesM.D.   On: 07/30/2021 14:27   Result Date: 07/30/2021 CLINICAL DATA:  NG tube placement. EXAM: PORTABLE CHEST 1 VIEW COMPARISON:   07/29/2021. FINDINGS: NG tube is partly visualized, curled in the neck, presumably in the oropharynx. Cardiac silhouette normal in size. No mediastinal or hilar masses. Lungs demonstrate prominent bronchovascular markings, otherwise clear. No pleural effusion or pneumothorax. IMPRESSION: 1. NG tube well positioned, curled in the neck, presumably in the oropharynx. 2. No acute cardiopulmonary disease. Electronically Signed: By: DLajean ManesM.D. On: 07/30/2021 13:46   DG Abd 1 View  Result Date: 07/30/2021 CLINICAL DATA:  Gastric  distension EXAM: ABDOMEN - 1 VIEW COMPARISON:  None Available. FINDINGS: The bowel gas pattern is nonobstructive. Mild gaseous distension of stomach and bowel. There are no abnormal calcifications overlying the kidneys. Pelvic phleboliths. Chronic right anterolateral tenth eleventh rib injuries. Degenerative changes of the spine with levoconvex lumbar curvature. IMPRESSION: No evidence of bowel obstruction. Electronically Signed   By: Maurine Simmering M.D.   On: 07/30/2021 11:33   MR ABDOMEN MRCP WO CONTRAST  Result Date: 07/29/2021 CLINICAL DATA:  Upper abdominal pain. Dilated common bile duct on ultrasound. EXAM: MRI ABDOMEN WITHOUT CONTRAST  (INCLUDING MRCP) TECHNIQUE: Multiplanar multisequence MR imaging of the abdomen was performed. Heavily T2-weighted images of the biliary and pancreatic ducts were obtained, and three-dimensional MRCP images were rendered by post processing. COMPARISON:  Ultrasound 07/29/2021 FINDINGS: Motion degradation exam. ICU patient. ICU patients have difficulty following breath holding commands. Lower chest:  Small bilateral pleural effusions Hepatobiliary: No intrahepatic biliary duct dilatation. Common bile duct normal caliber. The no gallstones identified. Small amount sludge within the gallbladder. No gallbladder distension or inflammation. Pancreas: Normal pancreatic parenchymal intensity. No ductal dilatation or inflammation. Spleen: Normal spleen.  Adrenals/urinary tract: Adrenal glands and kidneys are normal. Stomach/Bowel: Stomach and limited of the small bowel is unremarkable Vascular/Lymphatic: Abdominal aortic normal caliber. No retroperitoneal periportal lymphadenopathy. Musculoskeletal: No aggressive osseous lesion IMPRESSION: 1. Normal common bile duct. No intrahepatic biliary duct dilatation. 2. Small amount gallbladder sludge.  No evidence of cholecystitis. 3. Normal pancreas. 4. Bibasilar atelectasis. 5. Imaging degraded by respiratory motion. Electronically Signed   By: Suzy Bouchard M.D.   On: 07/29/2021 19:59   MR 3D Recon At Scanner  Result Date: 07/29/2021 CLINICAL DATA:  Upper abdominal pain. Dilated common bile duct on ultrasound. EXAM: MRI ABDOMEN WITHOUT CONTRAST  (INCLUDING MRCP) TECHNIQUE: Multiplanar multisequence MR imaging of the abdomen was performed. Heavily T2-weighted images of the biliary and pancreatic ducts were obtained, and three-dimensional MRCP images were rendered by post processing. COMPARISON:  Ultrasound 07/29/2021 FINDINGS: Motion degradation exam. ICU patient. ICU patients have difficulty following breath holding commands. Lower chest:  Small bilateral pleural effusions Hepatobiliary: No intrahepatic biliary duct dilatation. Common bile duct normal caliber. The no gallstones identified. Small amount sludge within the gallbladder. No gallbladder distension or inflammation. Pancreas: Normal pancreatic parenchymal intensity. No ductal dilatation or inflammation. Spleen: Normal spleen. Adrenals/urinary tract: Adrenal glands and kidneys are normal. Stomach/Bowel: Stomach and limited of the small bowel is unremarkable Vascular/Lymphatic: Abdominal aortic normal caliber. No retroperitoneal periportal lymphadenopathy. Musculoskeletal: No aggressive osseous lesion IMPRESSION: 1. Normal common bile duct. No intrahepatic biliary duct dilatation. 2. Small amount gallbladder sludge.  No evidence of cholecystitis. 3. Normal  pancreas. 4. Bibasilar atelectasis. 5. Imaging degraded by respiratory motion. Electronically Signed   By: Suzy Bouchard M.D.   On: 07/29/2021 19:59   DG CHEST PORT 1 VIEW  Result Date: 07/29/2021 CLINICAL DATA:  Shortness of breath.  Chest pain.  Diaphoresis. EXAM: PORTABLE CHEST 1 VIEW COMPARISON:  07/29/2021 FINDINGS: Low lung volumes are present, causing crowding of the pulmonary vasculature. There is likely mild subsegmental atelectasis along both hemidiaphragms. Mild cardiomegaly. Prominence of the right upper mediastinal margin, cannot exclude adenopathy or nodule. IMPRESSION: 1. Low lung volumes and suspected mild atelectasis at the lung bases. 2. Somewhat nodular prominence the right upper mediastinum, cannot exclude adenopathy or nodule although this might possibly be from tortuous vasculature. Chest CT could be utilized for further characterization. 3. Mild cardiomegaly. Electronically Signed   By: Cindra Eves.D.  On: 07/29/2021 10:05   US Abdomen Limited RUQ (LIVER/GB)  Result Date: 07/29/2021 CLINICAL DATA:  Abnormal LFTs EXAM: ULTRASOUND ABDOMEN LIMITED RIGHT UPPER QUADRANT COMPARISON:  CT abdomen and pelvis 07/28/2021 FINDINGS: Gallbladder: Moderately distended and contains echogenic calculi and sludge dependently. 5 mm echogenic likely polyp at the fundus. No significant wall thickening or pericholecystic fluid identified. Common bile duct: Diameter: 8 mm, upper normal for the patient's age. Liver: No focal lesion identified. Within normal limits in parenchymal echogenicity. Portal vein is patent on color Doppler imaging with normal direction of blood flow towards the liver. Other: None. IMPRESSION: 1. Cholelithiasis and sludge. 2. 5 mm likely polyp in the gallbladder. 3. Upper normal caliber of the common bile duct and moderate distention of the gallbladder. Correlate clinically and consider MRCP if indicated. Electronically Signed   By: Ofilia Neas M.D.   On: 07/29/2021  09:56   DG CHEST PORT 1 VIEW  Result Date: 07/29/2021 CLINICAL DATA:  Wheezing EXAM: PORTABLE CHEST 1 VIEW COMPARISON:  07/28/2021 FINDINGS: Limited low volume chest with generalized interstitial coarsening/crowding. No Kerley lines or pleural effusion. Normal heart size and mediastinal contours. Extensive artifact from EKG leads IMPRESSION: Limited low volume chest that is stable from yesterday Electronically Signed   By: Jorje Guild M.D.   On: 07/29/2021 06:48   DG Wrist Complete Left  Result Date: 07/29/2021 CLINICAL DATA:  Wrist pain EXAM: LEFT WRIST - COMPLETE 3+ VIEW COMPARISON:  None Available. FINDINGS: No fracture or malalignment. Vascular calcifications. Advanced joint space narrowing of the radiocarpal joint. No erosions. IMPRESSION: 1. 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 2. No acute osseous abnormality Electronically Signed   By: Donavan Foil M.D.   On: 07/29/2021 00:09   CT ABDOMEN PELVIS WO CONTRAST  Result Date: 07/28/2021 CLINICAL DATA:  Abdominal pain EXAM: CT ABDOMEN AND PELVIS WITHOUT CONTRAST TECHNIQUE: Multidetector CT imaging of the abdomen and pelvis was performed following the standard protocol without IV contrast. RADIATION DOSE REDUCTION: This exam was performed according to the departmental dose-optimization program which includes automated exposure control, adjustment of the mA and/or kV according to patient size and/or use of iterative reconstruction technique. COMPARISON:  07/26/2021 FINDINGS: Lower chest: Small pleural effusions are noted bilaterally slightly increased when compared with the prior exam. Mild bibasilar atelectasis is seen. Hepatobiliary: Liver is within normal limits. Dependent gallstones are noted within the gallbladder which is well distended. Pneumobilia is again identified consistent with the given clinical history of sphincterotomy. Pancreas: Unremarkable. No pancreatic ductal dilatation or surrounding inflammatory changes. Spleen: Normal in size  without focal abnormality. Adrenals/Urinary Tract: Adrenal glands are within normal limits bilaterally. Kidneys show no tiny nonobstructing renal stone on the right. No definitive stones are noted on the left. Hypodense lesion is noted within the left kidney measuring approximately 1.5 cm consistent with small cysts stable in appearance from the prior exam. No obstructive changes are seen. The bladder is well distended with mildly opacified urine. A portion of the bladder extends into a large right sided inguinal hernia. This is stable in appearance from the prior exam. Stomach/Bowel: Scattered fecal material is noted throughout the colon. Mild diverticular change is seen. The appendix has been surgically removed consistent with the given clinical history. Small bowel and stomach are unremarkable. Vascular/Lymphatic: Aortic atherosclerosis. No enlarged abdominal or pelvic lymph nodes. Reproductive: Prostate is unremarkable. Other: No ascites is noted. Bilateral fat containing inguinal hernias are seen. Persistent herniated bladder into the right hernia is noted. Musculoskeletal: Degenerative changes of lumbar spine  are seen. IMPRESSION: Small nonobstructing right renal stone. Bilateral inguinal hernias with herniated bladder on the right stable from the prior study. Multiple small gallstones without complicating factors. No other focal abnormality is noted. Electronically Signed   By: Inez Catalina M.D.   On: 07/28/2021 23:58   DG Chest Port 1 View  Result Date: 07/28/2021 CLINICAL DATA:  Respiratory distress EXAM: PORTABLE CHEST 1 VIEW COMPARISON:  07/26/2021 FINDINGS: Hypoventilatory changes. No consolidation or effusion. Stable cardiomediastinal silhouette. No pneumothorax IMPRESSION: No active disease.  Low lung volumes. Electronically Signed   By: Donavan Foil M.D.   On: 07/28/2021 22:42   DG Chest Port 1 View  Result Date: 07/26/2021 CLINICAL DATA:  leukocytosis, back pain, poss PNA? EXAM: PORTABLE CHEST  1 VIEW COMPARISON:  Radiograph 08/01/2018 FINDINGS: Unchanged cardiomediastinal silhouette. Low lung volumes. No focal airspace disease. No pleural effusion. No pneumothorax. 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 IMPRESSION: Low lung volumes.  No focal airspace disease. High-riding humeral heads bilaterally can be seen in distal rotator cuff tendinopathy. Electronically Signed   By: Maurine Simmering M.D.   On: 07/26/2021 14:09   CT Abdomen Pelvis W Contrast  Result Date: 07/26/2021 CLINICAL DATA:  Abdominal pain, acute, nonlocalized dementia, prior abd surgery, back? pain, diaphoresis, concern for aorta vs. nephrolith EXAM: CT ABDOMEN AND PELVIS WITH CONTRAST TECHNIQUE: Multidetector CT imaging of the abdomen and pelvis was performed using the standard protocol following bolus administration of intravenous contrast. RADIATION DOSE REDUCTION: This exam was performed according to the departmental dose-optimization program which includes automated exposure control, adjustment of the mA and/or kV according to patient size and/or use of iterative reconstruction technique. CONTRAST:  34m OMNIPAQUE IOHEXOL 300 MG/ML  SOLN COMPARISON:  CT 12/23/2015 FINDINGS: Lower chest: No acute abnormality. Hepatobiliary: No focal liver abnormality is seen. Mild gallbladder distension with layering sludge or tiny stones. There is pneumobilia consistent with history of prior ERCP and sphincterotomy. Pancreas: Unremarkable. No pancreatic ductal dilatation or surrounding inflammatory changes. Spleen: Normal in size without focal abnormality. Adrenals/Urinary Tract: Adrenal glands are unremarkable. No hydronephrosis or nephrolithiasis. There are nonobstructive bilateral renal stones. Unchanged bilateral renal cysts. There is a right inguinal hernia containing herniated bladder. Stomach/Bowel: The stomach is within normal limits. There is no evidence of bowel obstruction.Prior appendectomy. Scattered colonic  diverticula. No diverticulitis. Moderate rectal stool burden. Vascular/Lymphatic: Aortoiliac atherosclerosis. No AAA. No lymphadenopathy. Reproductive: Unremarkable. Other: Right inguinal hernia containing hernia bladder. Fat containing left inguinal hernia. Unchanged anterior abdominal wall scarring. Musculoskeletal: Levoconvex upper lumbar and dextroconvex lower lumbar curvatures. 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 There is severe multilevel facet arthropathy. Mild bilateral hip osteoarthritis. IMPRESSION: Nonobstructive 3 mm renal stones bilaterally. No hydronephrosis or ureterolithiasis. Right inguinal hernia containing distended herniated bladder. Mild gallbladder is tension with layering sludge or small stones. No other findings to suggest cholecystitis by CT. 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 Aortoiliac atherosclerosis.  No AAA. Moderate rectal stool burden. Electronically Signed   By: JMaurine SimmeringM.D.   On: 07/26/2021 10:10    Labs:  CBC: Recent Labs    07/29/21 0345 07/30/21 0444 07/31/21 0330 08/01/21 0222  WBC 11.2* 13.6* 12.8* 16.7*  HGB 10.6* 10.7* 10.3* 11.7*  HCT 30.9* 30.4* 29.6* 34.2*  PLT 90* 75* 72* 92*    COAGS: Recent Labs    07/28/21 2231 07/29/21 0345  INR 1.2 1.4*  BMP: Recent Labs    07/29/21 0345 07/30/21 0444 07/31/21 0330 08/01/21 1434  NA 133* 136 140 151*  K 4.3 3.9 3.6 3.7  CL 103 108 113* 125*  CO2 20* 19* 18* 23  GLUCOSE 137* 128* 140* 158*  BUN 59* 84* 104* 85*  CALCIUM 7.9* 7.8* 7.4* 7.8*  CREATININE 2.30* 2.72* 2.55* 1.59*  GFRNONAA 28* 23* 25* 44*    LIVER FUNCTION TESTS: Recent Labs    07/29/21 0345 07/30/21 0444 07/31/21 0330 08/01/21 1434  BILITOT 5.2* 5.9* 4.4* 3.1*  AST 80* 68* 52* 32  ALT '10 10 12 28  '$ ALKPHOS 123 127* 137* 169*  PROT 5.2* 5.3* 5.2* 5.2*   ALBUMIN 2.4* 2.4* 2.1* 2.0*    TUMOR MARKERS: No results for input(s): "AFPTM", "CEA", "CA199", "CHROMGRNA" in the last 8760 hours.  Assessment and Plan:  Acute cholecystitis --Not a surgical candidate --IR consulted for Perc chole placement --OK to proceed with procedure   Risks and benefits discussed with the patient including, but not limited to bleeding, infection, gallbladder perforation, bile leak, sepsis or even death.  All of the patient's questions were answered, patient is agreeable to proceed. Consent signed and in chart.   Thank you for this interesting consult.  I greatly enjoyed meeting REDMOND WHITTLEY and look forward to participating in their care.  A copy of this report was sent to the requesting provider on this date.  Electronically Signed: Pasty Spillers, PA 08/02/2021, 10:45 AM   I spent a total of 20 Minutes in face to face in clinical consultation, greater than 50% of which was counseling/coordinating care for acute cholecystitis

## 2021-08-02 NOTE — Procedures (Addendum)
Interventional Radiology Procedure Note  Procedure: Image guided drain placement, perc chole.  57F pigtail drain.  Complications: None  EBL: None Sample: Culture sent  Recommendations: - Routine drain care record output - follow up Cx - routine wound care - ok to restart any AC needed  Signed,  Dulcy Fanny. Earleen Newport, DO

## 2021-08-02 NOTE — Sedation Documentation (Signed)
Pt arrived from [facility] via carelink. Placed pt on monitor. IV flushed. VSS at this time. Will continue to monitor pt

## 2021-08-02 NOTE — Assessment & Plan Note (Addendum)
-  as mentioned, patient not a candidate for surgery - IR placed cholecystostomy tube 08/02/21 -Continue supportive care and IV antibiotics.

## 2021-08-02 NOTE — Progress Notes (Signed)
[facility]  Patient moaning in bed. Did say hello to me which is much improved compared to prior.  BP (!) 149/82   Pulse 89   Temp 99.5 F (37.5 C) (Axillary)   Resp 19   Ht '5\' 10"'$  (1.778 m)   Wt 96.8 kg   SpO2 97%   BMI 30.62 kg/m  Soft, distended, epigastric, RUQ tenderness  Patient with acute cholecystitis. Will need IR drain given the encephalopathy and continued leukocytosis.   IR drain Antibiotics for cholecystitis   Curlene Labrum, MD [facility] [place], [place] [id number] [phone] (office)

## 2021-08-02 NOTE — Progress Notes (Signed)
Planning for Perc chole today for Mr. Haydon.  RN at [facility] is aware and will facilitate transport with [facility] ASAP. H&P to follow  Electronically Signed: Pasty Spillers, PA-C 08/02/2021, 9:17 AM

## 2021-08-02 NOTE — Progress Notes (Signed)
Palliative: Elijah [name] is resting quietly in bed.  He appears acutely/chronically ill and quite frail.  He does not respond in any meaningful way to voice or touch.  He clearly cannot make his basic needs known.  His wife, Elijah [name], and his daughter Elijah [name], are present at bedside.  We talk about the plan for IR placing a percutaneous drain today.  We talked about expectations for drain placement, seeing meaningful improvement in 2 to 3 days.  Elijah [name] and Elijah [name] asked questions about nutrition such as attempting to place NG tube, and how Elijah [name] has to participate in order to successfully place.  We talked about some expectations related to NG placement and expectations without nutrition.  We talk about a few "what if's and maybe's" related to disposition including, but not limited to, ability to participate, need for PT evaluation, starting early planning for discharge, the benefits and downfalls of short-term rehab/long-term care, and some discussions about hospice care.  Elijah [name] shares her husband's experience with his mother who had residential hospice in [place] in 2010.  We talked about CODE STATUS.  Elijah [name]'s shares that the family had discussions last night.  She shares that, at this point, she would want full scope/full code but would choose DNR if Elijah [name] were not able to have a good return to function.  I share a diagram of the chronic illness pathway, what is normal and expected.  I also show a diagram describing the longer recovery takes, often the less recovery is had.  I ask Elijah [name] if she were to be surprised if he were to recover, but not able to return to living in their home.  She shares that this would not surprise her.  I ask if Elijah [name] would be okay with living in a nursing home.  She tells me that he "would not want to", but has stated in the past that he would.  Daughter Elijah [name] shares that she feels that Elijah [name] has done so well so far only because he has been able to stay  in their home.  Conference with attending, bedside nursing staff, transition of care team related to patient condition, needs, goals of care, disposition.  Plan:    At this point full scope/full code.  To [facility] for IR drain replacement today.  Time for outcomes.        90 minutes  Quinn Axe, NP Palliative medicine team Team phone [phone] Greater than 50% of this time was spent counseling and coordinating care related to the above assessment and plan.

## 2021-08-02 NOTE — Sedation Documentation (Signed)
[facility] at bedside. Report given to [facility]

## 2021-08-02 NOTE — Plan of Care (Signed)
  Problem: Fluid Volume: Goal: Hemodynamic stability will improve Outcome: Progressing   

## 2021-08-02 NOTE — Progress Notes (Signed)
Received call from [facility] from [place], [place], reported that ready for patient to come to [facility] IR for cholecystostomy & that she will obtain consent from patient's wife Vaughan Basta, requested to request [facility] transport for patient, [facility] Ander Purpura) notified & transport set-up ETA given for pick up aprox 1 to 2hrs, patient's wife Vaughan Basta at bedside at this time & made aware, attending MD made aware

## 2021-08-02 NOTE — Progress Notes (Signed)
Received call from Sonia Side with [facility], report on patient given, reported ETA for pick up aprox 73mns, wife made aware

## 2021-08-02 NOTE — Progress Notes (Signed)
SLP Cancellation Note  Patient Details Name: LAURIN MORGENSTERN MRN: [id number] DOB: [birth year]   Cancelled treatment:       Reason Eval/Treat Not Completed: Fatigue/lethargy limiting ability to participate;Patient's level of consciousness;Patient not medically ready. Pt continues to be inappropriate for trials. ST will continue efforts. Thank you,  Abel Hageman H. Roddie Mc, CCC-SLP Speech Language Pathologist    Wende Bushy 08/02/2021, 9:35 AM

## 2021-08-02 NOTE — Progress Notes (Signed)
Progress Note   Patient: Elijah [name]:[id number] DOB: 04-17-1940 DOA: 07/28/2021     4 DOS: the patient was seen and examined on 08/02/2021   Brief hospital course: As per H&P written by Dr.Zierle-Ghosh On 07/29/2021 Elijah [name] is a 82 y.o. male with medical history significant of history of chronic low back pain, diabetes mellitus type 2, hyperlipidemia, hypertension, Parkinson disease, obstructive sleep apnea, history of biliary obstruction and pancreatitis with ERCP in 2018, presents ED with chief complaint of dyspnea.  At time of presentation wife was at bedside.  She reported to the ED provider that patient has had 24 hours of shortness of breath and increased work of breathing.  He was recently seen in the ED 2 days ago for back pain, which was acute on chronic.  They had denied fevers at home, but wife did report the patient had left arm pain when she was trying to lift him up.  ED provider note reports that patient denied current abdominal pain.  Patient is not answering questions for me, but does have voluntary guarding with palpation of his abdomen.  Patient arrived in the ER satting in the low 90s on 4 L nasal cannula.  He was put on nonrebreather for work of breathing.  Sepsis protocol was started.  Patient was treated with vancomycin, cefepime, Flagyl, and given a 3 L bolus.  Lactic acid was significantly elevated 6.3, repeat 4.6.  Chest x-ray was negative for acute disease, CT abdomen pelvis did not given definitive explanation of his symptoms either.  Urine was borderline.  Patient did have an AKI.  Admission was requested for further work-up of altered mental status, dyspnea, and most likely sepsis.  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 -Creatinine normally in the 0.6-0.8 range at baseline; at time of admission 2.24 and peaked at 2.72; now down to 1.59. -Most likely in the setting  of infection process and prerenal azotemia.  -Continue to maintain adequate hydration while following renal function trend. -Avoid hypotension and minimize the use of nephrotoxic agents.    Hypernatremia - Surgical label of the 153 (with corrected sodium for hyperglycemia) -IV fluids will be changed to D5 half-normal saline.  Currently mainly for maintenance.  Cholecystitis, acute -as mentioned, patient found not a candidate for acute cholecystectomy process; IR to assist with cholecystostomy. -Continue supportive care and IV antibiotics.  Thrombocytopenia ([place]) -Most likely in the setting of sepsis. -Platelets count have trended down into the 70sK -slightly up to 90'sK -Holding heparin products -SCDs for DVT prophylaxis. -Follow platelet count -No overt bleeding.  Hyperbilirubinemia -OBSTRUCTION not appreciated on MRCP. -Continue to follow trend -Maintain adequate hydration -GI and general surgery on board will follow recommendations. -HIDA scan ordered and suggesting positive cholecystitis and possible cystic duct obstruction.  -planning cholecystostomy later today.  Bacteremia -Strep B bacteremia appreciated -Continue treatment with IV antibiotics; IV Rocephin has been initiated following sensitivity and cultures results.   Acute metabolic encephalopathy -Baseline is unclear, and history was taken mostly by wife and by ED provider. -Patient with underlying history of mood disorder/dementia and Parkinson. -Continue to be very obtunded/lethargic today. -Noticed blood urea 105 on 6/10 (probably contributing); now down to 85. Will continue to monitor. -CT head, ammonia and ABG within normal limits and not explaining current encephalopathy process.  -Patient is not following commands, eating or drinking. -Most likely secondary to metabolic encephalopathy and sepsis -Treatment  as already mentioned. -prognosis is guarded -Palliative care consulted.  HLD  (hyperlipidemia) -Patient mentation preventing him to be able to take oral medication -Holding statin for now.  Lactic acidosis -Secondary to sepsis -Initial lactic acid 6.3, repeat 4.6>> last one around 1.0 -Continue to maintain adequate IV hydration -follow response and ability to take PO's to maintain adequate hydration.   Acute respiratory failure with hypoxia ([place]) -Patient with good saturation currently on 2-3 L. -Patient ended increasing work of breathing to the point of requiring transient use of BiPAP at time of admission. -Continue current IV antibiotics as he had chest x-ray findings suggesting pneumonia on the right side.   -Patient continues to be tachypneic intermittently. -No chest pain appreciated.  Severe sepsis ([facility]) -Heart rate 100, respiratory rate 30-44, lactic acidosis 6.3, AKI 2.24 -Possible intra-abdominal infection versus aspiration pneumonia; patient blood culture positive for strep B -Continue IV Rocephin based on sensitivity results for strep B. -Continue to maintain adequate hydration. -WBC's trending up. -Follow resolution of sepsis physiology. -Off BiPAP and with good saturation demonstrated on 4-5 L [place] supplementation with great saturation. -COVID and flu are negative    Gallstones -No gallstones has been seen at this point -Continue n.p.o. status -Continue IV antibiotics (using Rocephin). -HIDA scan recommended by general surgery; results with concerns for cystic duct obstruction and cholecystitis.  -No surgical candidate at this time -After discussing with family member decisions made to pursued cholecystostomy drain. -Patient will be transferred back and forth to [facility] for IR intervention.  Total bilirubin, elevated -CMET demonstrating improvement in LFT;s and bilirubin level. -Patient does have history of biliary obstruction and ERCP back in 2018 -CT scan shows bilateral inguinal hernias, herniated bladder on the right, small  nonobstructing stone, and gallstones without complication -Vancomycin has been discontinued. -MRCP has rule out retained stones or any masses in his liver. -Continue to maintain adequate hydration -LFTs has continued trending down overall. -Continue IV antibiotics using Rocephin based on cultures/sensitivity results. -After discussing with general surgery decision made to check HIDA scan; which demonstrated positive result for acute cholecystitis and cystic duct obstruction. -Plan is for cholecystostomy.  Patient found to be not a candidate for cholecystectomy/surgical intervention. -No endoscopic interventions needed at this point.  Essential hypertension -Continue holding ARB and  HCTZ due to AKI -Blood pressure has now stabilized; given the presence of irregular heart rate, will continue the use of low-dose Lopressor IV. -BP and HR now controlled.   Diabetes mellitus without complication ([place]) -Continue holding metformin -Patient currently n.p.o. -Will continue to monitor glucose with CBGs and if it starts trending up will add insulin coverage. -so far CBG's are stable.  Parkinson disease ([place]) -Continue patient's Sinemet when able to take by mouth. -Patient with underlying mood disorder and mild hallucinations/psychosis as per patient's wife reports. -Too lethargic to safely take oral meds currently. -Unable to place NG tube on 07/30/21 (very traumatic and never able to passed from his neck).   Subjective:  No fever, no nausea, no vomiting, using 2-3 L nasal cannula supplementation.  Continue to demonstrate extensive encephalopathic process, no able to follow commands, eat or drink.  Physical examination demonstrating son right quadrant discomfort on abdominal palpation.  According to nursing staff patient was able to say a few words to family members.  None for me.  Physical Exam: Vitals:   08/02/21 0841 08/02/21 0900 08/02/21 0911 08/02/21 1000  BP:  (!) 160/65  (!) 149/82   Pulse: 94 92 94 89  Resp:  17 (!) 23 (!) 23 19  Temp:      TempSrc:      SpO2: 96% 96% 97% 97%  Weight:      Height:       General exam: Remains lethargic/encephalopathic and unable to follow commands.  Son morning appreciated today while palpating right quadrant abdomen.  No fever, no nausea, no vomiting. Respiratory system: Fair air movement bilaterally; positive rhonchi; no crackles, no using accessory muscles.  Oxygen saturation has improved and down to 2-3 L supplementation. Cardiovascular system: Rate controlled, no rubs, no gallops, no JVD. Gastrointestinal system: Abdomen is obese, soft, some tenderness appreciated while palpating right quadrant abdomen.  No guarding. Central nervous system: Unable to properly assess with current encephalopathic process; patient moves 4 limbs spontaneously and is able to retract to pain. Extremities: No cyanosis or clubbing; upper extremities 1+ swelling appreciated.  Trace edema on his lower extremity seen and examined. Skin: No petechiae. Psychiatry: Unable to properly assess with current encephalopathic process.  Data Reviewed: CBC: WBCs 16.7, hemoglobin 11.7, platelets count 92 K Comprehensive metabolic panel: Sodium 561, potassium 3.7, BUN 85, creatinine 1.59; AST 32, ALT 28, alkaline phosphatase 169 and total bilirubin 3.1. CBG's: in the 140 range. Lipase 13 Lactic acid 1.0  Family Communication: Wife and daughter at bedside.  Disposition: Status is: Inpatient Remains inpatient appropriate because: Still with ongoing abnormal mental status changes, presence of strep B bacteremia with concerns for infection coming from biliary tract/gallbladder. WBC's trending up. Continue to require IV antibiotics. Renal function/BUN has worsen and patient is too encephalopathic to take oral meds/nutrition. Prognosis is guarded. Palliative care consulted.   Planned Discharge Destination: To be determined.   Author: Barton Dubois, MD 08/02/2021 11:49  AM  For on call review [email or web address].

## 2021-08-02 NOTE — Assessment & Plan Note (Addendum)
-   Na up to 158>>>137 -d/c D5W -start 1/2 NS>>improved

## 2021-08-03 ENCOUNTER — Inpatient Hospital Stay: Payer: Self-pay

## 2021-08-03 DIAGNOSIS — Z7189 Other specified counseling: Secondary | ICD-10-CM | POA: Diagnosis not present

## 2021-08-03 DIAGNOSIS — E870 Hyperosmolality and hypernatremia: Secondary | ICD-10-CM

## 2021-08-03 DIAGNOSIS — J9601 Acute respiratory failure with hypoxia: Secondary | ICD-10-CM | POA: Diagnosis not present

## 2021-08-03 DIAGNOSIS — K810 Acute cholecystitis: Secondary | ICD-10-CM | POA: Diagnosis not present

## 2021-08-03 DIAGNOSIS — Z515 Encounter for palliative care: Secondary | ICD-10-CM | POA: Diagnosis not present

## 2021-08-03 DIAGNOSIS — R7881 Bacteremia: Secondary | ICD-10-CM | POA: Diagnosis not present

## 2021-08-03 DIAGNOSIS — G9341 Metabolic encephalopathy: Secondary | ICD-10-CM | POA: Diagnosis not present

## 2021-08-03 DIAGNOSIS — N179 Acute kidney failure, unspecified: Secondary | ICD-10-CM | POA: Diagnosis not present

## 2021-08-03 DIAGNOSIS — B951 Streptococcus, group B, as the cause of diseases classified elsewhere: Secondary | ICD-10-CM

## 2021-08-03 LAB — GLUCOSE, CAPILLARY
Glucose-Capillary: 147 mg/dL — ABNORMAL HIGH (ref 70–99)
Glucose-Capillary: 200 mg/dL — ABNORMAL HIGH (ref 70–99)
Glucose-Capillary: 194 mg/dL — ABNORMAL HIGH (ref 70–99)

## 2021-08-03 LAB — COMPREHENSIVE METABOLIC PANEL
ALT: 22 U/L (ref 0–44)
AST: 23 U/L (ref 15–41)
Albumin: 1.9 g/dL — ABNORMAL LOW (ref 3.5–5.0)
Alkaline Phosphatase: 131 U/L — ABNORMAL HIGH (ref 38–126)
BUN: 67 mg/dL — ABNORMAL HIGH (ref 8–23)
CO2: 21 mmol/L — ABNORMAL LOW (ref 22–32)
Calcium: 8 mg/dL — ABNORMAL LOW (ref 8.9–10.3)
Chloride: >130 mmol/L (ref 98–111)
Creatinine, Ser: 1.37 mg/dL — ABNORMAL HIGH (ref 0.61–1.24)
GFR, Estimated: 53 mL/min — ABNORMAL LOW (ref >60)
Glucose, Bld: 182 mg/dL — ABNORMAL HIGH (ref 70–99)
Potassium: 4 mmol/L (ref 3.5–5.1)
Sodium: 158 mmol/L — ABNORMAL HIGH (ref 135–145)
Total Bilirubin: 2.1 mg/dL — ABNORMAL HIGH (ref 0.3–1.2)
Total Protein: 5.7 g/dL — ABNORMAL LOW (ref 6.5–8.1)

## 2021-08-03 LAB — CBC
HCT: 34.7 % — ABNORMAL LOW (ref 39.0–52.0)
Hemoglobin: 11.1 g/dL — ABNORMAL LOW (ref 13.0–17.0)
MCH: 31.4 pg (ref 26.0–34.0)
MCHC: 32 g/dL (ref 30.0–36.0)
MCV: 98.3 fL (ref 80.0–100.0)
Platelets: 177 10*3/uL (ref 150–400)
RBC: 3.53 MIL/uL — ABNORMAL LOW (ref 4.22–5.81)
RDW: 16.2 % — ABNORMAL HIGH (ref 11.5–15.5)
WBC: 19.6 10*3/uL — ABNORMAL HIGH (ref 4.0–10.5)
nRBC: 0 % (ref 0.0–0.2)

## 2021-08-03 MED ORDER — SODIUM CHLORIDE 0.9% FLUSH
5.0000 mL | Freq: Three times a day (TID) | INTRAVENOUS | Status: DC
  Administered 2021-08-03 – 2021-08-10 (×21): 10 mL
  Administered 2021-08-10: 5 mL
  Administered 2021-08-10 – 2021-08-11 (×2): 10 mL

## 2021-08-03 MED ORDER — DEXTROSE 5 % IV SOLN: INTRAVENOUS | Status: DC

## 2021-08-03 MED ORDER — ASPIRIN 300 MG RE SUPP
300.0000 mg | Freq: Every day | RECTAL | Status: DC
Start: 2021-08-03 — End: 2021-08-11
  Administered 2021-08-03 – 2021-08-10 (×8): 300 mg via RECTAL
  Filled 2021-08-03 (×8): qty 1

## 2021-08-03 MED ORDER — SODIUM CHLORIDE 0.9% FLUSH
10.0000 mL | Freq: Two times a day (BID) | INTRAVENOUS | Status: DC
  Administered 2021-08-03 – 2021-08-10 (×16): 10 mL

## 2021-08-03 MED ORDER — SODIUM CHLORIDE 0.9% FLUSH: 10.0000 mL | INTRAVENOUS | Status: DC | PRN

## 2021-08-03 NOTE — Progress Notes (Signed)
Noted patient has improved more since yesterday at this time, opened his eyes on command this AM for family which he hasn't been doing, he responded when asked about his pain, MD changed pain Morphine (wasn't effective) to PRN Hydromorphone yesterday & noted a lot of improvement with pain control & the patient actually slept better & was noted snoring & much more relaxed, his ABD is more palpable & softer than before, still groans & moans loud with pain when touched/moved/turned/patient care etc but not as bad as it was prior to pain med change & drain placement, percutaneous chole drain noted not draining well this AM upon shift change & report received from night shift RN that he had flushed it but didn't get drainage return from it, this RN milked drain tube & flushed it & dark brown/stain yellow biliary drainage returned & noted 133m in drainage bag, family at bedside & updated at this time

## 2021-08-03 NOTE — Progress Notes (Signed)
Critical Value called by lab of chloride greater than 130. MD notified via secure chat. Order to stop fluids.

## 2021-08-03 NOTE — Progress Notes (Signed)
Palliative: [name], Elijah [name] [name], is lying quietly in bed.  He will occasionally moan if touched.  He appears acutely/chronically ill and quite frail.  He does not open his eyes to voice or touch, although family states that he has opened his eyes for them this morning.  He clearly cannot make his basic needs known.  Present today at bedside are his wife Vaughan Basta and daughter Lattie Haw.  We talk in detail about [name] drain placement including, but limited not limited to drainage, flushing, anticipated length of time for drain.  Family states that they spoke with general surgery, Dr. Arnoldo Morale this morning.  We also talk about [name] increased white blood cells, likely due to yesterday's procedure.  We talked about poor by mouth intake and speech therapy consult scheduled for today.  Mrs. Morren shares that she met speech therapy yesterday.  We talk about time for outcomes, sharing that I am cautiously optimistic.  I encourage family to let [name] [name] rest and try to heal today.  Wife and daughter asked appropriate questions.  We talked about possible timelines, a few more days in ICU, a few days on the hospital floor.  We talked about need for speech therapy consult, PT if and when [name] is able, and some considerations around disposition.  Lattie Haw asks about [name] ability to take adequate nutrition and how this would relate to disposition.  We again cover that "we must eat to live".    Conference with attending, bedside nursing staff, transition of care team related to patient condition, needs, goals of care, disposition.  Plan: Remains full scope/full code.  Time for outcomes.  57 minutes  Quinn Axe, NP Palliative medicine team Team phone [phone] Greater than 50% of this time was spent counseling and care related to the above assessment and plan.

## 2021-08-03 NOTE — Progress Notes (Signed)
PROGRESS NOTE  Elijah [name] YIR:[id number] DOB: [birth year] DOA: 07/28/2021 PCP: Celene Squibb, MD  Brief History:  As per H&P written by Dr.Zierle-Ghosh On 07/29/2021 Elijah [name] is a 81 y.o. male with medical history significant of history of chronic low back pain, diabetes mellitus type 2, hyperlipidemia, hypertension, Parkinson disease, obstructive sleep apnea, history of biliary obstruction and pancreatitis with ERCP in 2018, presents ED with chief complaint of dyspnea.  At time of presentation wife was at bedside.  She reported to the ED provider that patient has had 24 hours of shortness of breath and increased work of breathing.  He was recently seen in the ED 2 days ago for back pain, which was acute on chronic.  They had denied fevers at home, but wife did report the patient had left arm pain when she was trying to lift him up.  ED provider note reports that patient denied current abdominal pain.  Patient is not answering questions for me, but does have voluntary guarding with palpation of his abdomen.  Patient arrived in the ER satting in the low 90s on 4 L nasal cannula.  He was put on nonrebreather for work of breathing.  Sepsis protocol was started.  Patient was treated with vancomycin, cefepime, Flagyl, and given a 3 L bolus.  Lactic acid was significantly elevated 6.3, repeat 4.6.  Chest x-ray was negative for acute disease, CT abdomen pelvis did not given definitive explanation of his symptoms either.  Urine was borderline.  Patient did have an AKI.  Admission was requested for further work-up of altered mental status, dyspnea, and msepsis.  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 -Creatinine normally in the 0.6-0.8 range at baseline; at time of admission 2.24 and peaked at 2.72; now down to 1.59. -Most likely in the setting of infection process and prerenal azotemia.  -Continue  to maintain adequate hydration while following renal function trend. -Avoid hypotension and minimize the use of nephrotoxic agents.    Hypernatremia - Surgical label of the 153 (with corrected sodium for hyperglycemia) -IV fluids will be changed to D5 half-normal saline.  Currently mainly for maintenance.  Cholecystitis, acute -as mentioned, patient found not a candidate for acute cholecystectomy process; IR to assist with cholecystostomy. -Continue supportive care and IV antibiotics.  Thrombocytopenia ([place]) -Most likely in the setting of sepsis. -Platelets count have trended down into the 70sK -slightly up to 90'sK -Holding heparin products -SCDs for DVT prophylaxis. -Follow platelet count -No overt bleeding.  Hyperbilirubinemia -OBSTRUCTION not appreciated on MRCP. -Continue to follow trend -Maintain adequate hydration -GI and general surgery on board will follow recommendations. -HIDA scan ordered and suggesting positive cholecystitis and possible cystic duct obstruction.  -planning cholecystostomy later today.  Bacteremia -Strep B bacteremia appreciated -Continue treatment with IV antibiotics; IV Rocephin has been initiated following sensitivity and cultures results.   Acute metabolic encephalopathy -Baseline is unclear, and history was taken mostly by wife and by ED provider. -Patient with underlying history of mood disorder/dementia and Parkinson. -Continue to be very obtunded/lethargic today. -Noticed blood urea 105 on 6/10 (probably contributing); now down to 85. Will continue to monitor. -CT head, ammonia and ABG within normal limits and not explaining current encephalopathy process.  -Patient is not following commands, eating or drinking. -Most likely secondary to metabolic encephalopathy and sepsis -Treatment as already mentioned. -prognosis is  guarded -Palliative care consulted.  HLD (hyperlipidemia) -Patient mentation preventing him to be able to take oral  medication -Holding statin for now.  Lactic acidosis -Secondary to sepsis -Initial lactic acid 6.3, repeat 4.6>> last one around 1.0 -Continue to maintain adequate IV hydration -follow response and ability to take PO's to maintain adequate hydration.   Acute respiratory failure with hypoxia ([place]) -Patient with good saturation currently on 2-3 L. -Patient ended increasing work of breathing to the point of requiring transient use of BiPAP at time of admission. -Continue current IV antibiotics as he had chest x-ray findings suggesting pneumonia on the right side.   -Patient continues to be tachypneic intermittently. -No chest pain appreciated.  Severe sepsis ([facility]) -Heart rate 100, respiratory rate 30-44, lactic acidosis 6.3, AKI 2.24 -Possible intra-abdominal infection versus aspiration pneumonia; patient blood culture positive for strep B -Continue IV Rocephin based on sensitivity results for strep B. -Continue to maintain adequate hydration. -WBC's trending up. -Follow resolution of sepsis physiology. -Off BiPAP and with good saturation demonstrated on 4-5 L [place] supplementation with great saturation. -COVID and flu are negative    Gallstones -No gallstones has been seen at this point -Continue n.p.o. status -Continue IV antibiotics (using Rocephin). -HIDA scan recommended by general surgery; results with concerns for cystic duct obstruction and cholecystitis.  -No surgical candidate at this time -After discussing with family member decisions made to pursued cholecystostomy drain. -Patient will be transferred back and forth to [facility] for IR intervention.  Total bilirubin, elevated -CMET demonstrating improvement in LFT;s and bilirubin level. -Patient does have history of biliary obstruction and ERCP back in 2018 -CT scan shows bilateral inguinal hernias, herniated bladder on the right, small nonobstructing stone, and gallstones without complication -Vancomycin has been  discontinued. -MRCP has rule out retained stones or any masses in his liver. -Continue to maintain adequate hydration -LFTs has continued trending down overall. -Continue IV antibiotics using Rocephin based on cultures/sensitivity results. -After discussing with general surgery decision made to check HIDA scan; which demonstrated positive result for acute cholecystitis and cystic duct obstruction. -Plan is for cholecystostomy.  Patient found to be not a candidate for cholecystectomy/surgical intervention. -No endoscopic interventions needed at this point.  Essential hypertension -Continue holding ARB and  HCTZ due to AKI -Blood pressure has now stabilized; given the presence of irregular heart rate, will continue the use of low-dose Lopressor IV. -BP and HR now controlled.   Diabetes mellitus without complication ([place]) -Continue holding metformin -Patient currently n.p.o. -Will continue to monitor glucose with CBGs and if it starts trending up will add insulin coverage. -so far CBG's are stable.  Parkinson disease ([place]) -Continue patient's Sinemet when able to take by mouth. -Patient with underlying mood disorder and mild hallucinations/psychosis as per patient's wife reports. -Too lethargic to safely take oral meds currently. -Unable to place NG tube on 07/30/21 (very traumatic and never able to passed from his neck).      Family Communication:  spouse updated at bedside 6/14  Consultants:  palliative, general surgery  Code Status:  FULL   DVT Prophylaxis:  SCDs   Procedures: As Listed in Progress Note Above  Antibiotics: Ceftriaxone 6/11>> Cefepime 6/8>>6/11     Subjective: Pt opens eyes and states name.  Does not answer any other questions.  No vomiting.  No diarrhea  Objective: Vitals:   08/03/21 0800 08/03/21 0900 08/03/21 0942 08/03/21 1100  BP: (!) 148/66 (!) 150/60    Pulse: 84 85 84   Resp:  $'16 17 18   's$ Temp:    98.3 F (36.8 C)  TempSrc:    Axillary   SpO2: 92% 91% 93%   Weight:      Height:        Intake/Output Summary (Last 24 hours) at 08/03/2021 1418 Last data filed at 08/03/2021 0500 Gross per 24 hour  Intake 5 ml  Output 1925 ml  Net -1920 ml   Weight change:  Exam:  General:  Pt is alert, follows commands appropriately, not in acute distress HEENT: No icterus, No thrush, No neck mass, [place]/AT Cardiovascular: RRR, S1/S2, no rubs, no gallops Respiratory: bibasilar rales. No wheeze Abdomen: Soft/+BS, RUQ tender, non distended, no guarding Extremities: trace LE edema, No lymphangitis, No petechiae, No rashes, no synovitis   Data Reviewed: I have personally reviewed following labs and imaging studies Basic Metabolic Panel: Recent Labs  Lab 07/29/21 0345 07/30/21 0444 07/31/21 0330 08/01/21 1434 08/03/21 0353  NA 133* 136 140 151* 158*  K 4.3 3.9 3.6 3.7 4.0  CL 103 108 113* 125* >130*  CO2 20* 19* 18* 23 21*  GLUCOSE 137* 128* 140* 158* 182*  BUN 59* 84* 104* 85* 67*  CREATININE 2.30* 2.72* 2.55* 1.59* 1.37*  CALCIUM 7.9* 7.8* 7.4* 7.8* 8.0*  MG 1.6*  --   --   --   --    Liver Function Tests: Recent Labs  Lab 07/29/21 0345 07/30/21 0444 07/31/21 0330 08/01/21 1434 08/03/21 0353  AST 80* 68* 52* 32 23  ALT '10 10 12 28 22  '$ ALKPHOS 123 127* 137* 169* 131*  BILITOT 5.2* 5.9* 4.4* 3.1* 2.1*  PROT 5.2* 5.3* 5.2* 5.2* 5.7*  ALBUMIN 2.4* 2.4* 2.1* 2.0* 1.9*   Recent Labs  Lab 07/28/21 2231 07/29/21 0345 07/30/21 0444  LIPASE '13 13 18   '$ Recent Labs  Lab 07/31/21 1423  AMMONIA 41*   Coagulation Profile: Recent Labs  Lab 07/28/21 2231 07/29/21 0345  INR 1.2 1.4*   CBC: Recent Labs  Lab 07/28/21 2231 07/29/21 0345 07/30/21 0444 07/31/21 0330 08/01/21 0222 08/03/21 0353  WBC 11.4* 11.2* 13.6* 12.8* 16.7* 19.6*  NEUTROABS 9.2* 10.3* 12.2* 10.5*  --   --   HGB 12.3* 10.6* 10.7* 10.3* 11.7* 11.1*  HCT 35.3* 30.9* 30.4* 29.6* 34.2* 34.7*  MCV 91.9 93.1 91.6 91.9 92.2 98.3  PLT 107* 90* 75*  72* 92* 177   Cardiac Enzymes: No results for input(s): "CKTOTAL", "CKMB", "CKMBINDEX", "TROPONINI" in the last 168 hours. BNP: Invalid input(s): "POCBNP" CBG: Recent Labs  Lab 07/31/21 1749 08/01/21 0003 08/01/21 0658 08/01/21 1145 08/03/21 1101  GLUCAP 123* 148* 149* 147* 147*   HbA1C: No results for input(s): "HGBA1C" in the last 72 hours. Urine analysis:    Component Value Date/Time   COLORURINE AMBER (A) 07/29/2021 1000   APPEARANCEUR CLOUDY (A) 07/29/2021 1000   LABSPEC 1.023 07/29/2021 1000   PHURINE 5.0 07/29/2021 1000   GLUCOSEU 50 (A) 07/29/2021 1000   HGBUR SMALL (A) 07/29/2021 1000   BILIRUBINUR NEGATIVE 07/29/2021 1000   KETONESUR 5 (A) 07/29/2021 1000   PROTEINUR 30 (A) 07/29/2021 1000   UROBILINOGEN 0.2 10/14/2009 1851   NITRITE NEGATIVE 07/29/2021 1000   LEUKOCYTESUR NEGATIVE 07/29/2021 1000   Sepsis Labs: '@LABRCNTIP'$ (procalcitonin:4,lacticidven:4) ) Recent Results (from the past 240 hour(s))  Culture, blood (Routine x 2)     Status: Abnormal   Collection Time: 07/28/21 10:33 PM   Specimen: BLOOD LEFT WRIST  Result Value Ref Range Status   Specimen Description  Final    BLOOD LEFT WRIST Performed at [facility], [address] [place], [place] [address]    Special Requests   Final    BOTTLES DRAWN AEROBIC AND ANAEROBIC Blood Culture adequate volume Performed at [facility], [address] [place], [place] [address]    Culture  Setup Time   Final    GRAM POSITIVE COCCI IN BOTH AEROBIC AND ANAEROBIC BOTTLES Gram Stain Report Called to,Read Back By and Verified With: CHILDRESS @ 8768 ON [id number] BY HENDERSON L CRITICAL VALUE NOTED.  VALUE IS CONSISTENT WITH PREVIOUSLY REPORTED AND CALLED VALUE.    Culture (A)  Final    GROUP B STREP(S.AGALACTIAE)ISOLATED SUSCEPTIBILITIES PERFORMED ON PREVIOUS CULTURE WITHIN THE LAST 5 DAYS. Performed at [facility], [place] [address], [place], [place] [address]    Report Status 07/31/2021 FINAL  Final   Culture, blood (Routine x 2)     Status: Abnormal   Collection Time: 07/28/21 10:33 PM   Specimen: BLOOD RIGHT HAND  Result Value Ref Range Status   Specimen Description   Final    BLOOD RIGHT HAND Performed at [facility], [address], [place], [place] [address]    Special Requests   Final    BOTTLES DRAWN AEROBIC AND ANAEROBIC Blood Culture results may not be optimal due to an excessive volume of blood received in culture bottles Performed at [facility], [address], [place], [place] [address]    Culture  Setup Time   Final    GRAM POSITIVE COCCI IN BOTH AEROBIC AND ANAEROBIC BOTTLES Gram Stain Report Called to,Read Back By and Verified With: CHILDRESS @ 0845 ON [id number] BY HENDERSON L CRITICAL RESULT CALLED TO, READ BACK BY AND VERIFIED WITH: Estelle June [phone] [id number] FCP Performed at [facility], [place] [address] [place], [place] [address]    Culture GROUP B STREP(S.AGALACTIAE)ISOLATED (A)  Final   Report Status 07/31/2021 FINAL  Final   Organism ID, Bacteria GROUP B STREP(S.AGALACTIAE)ISOLATED  Final      Susceptibility   Group b strep(s.agalactiae)isolated - MIC*    CLINDAMYCIN <=0.25 SENSITIVE Sensitive     AMPICILLIN <=0.25 SENSITIVE Sensitive     ERYTHROMYCIN <=0.12 SENSITIVE Sensitive     VANCOMYCIN 0.5 SENSITIVE Sensitive     CEFTRIAXONE <=0.12 SENSITIVE Sensitive     LEVOFLOXACIN 1 SENSITIVE Sensitive     * GROUP B STREP(S.AGALACTIAE)ISOLATED  Blood Culture ID Panel (Reflexed)     Status: Abnormal   Collection Time: 07/28/21 10:33 PM  Result Value Ref Range Status   Enterococcus faecalis NOT DETECTED NOT DETECTED Final   Enterococcus Faecium NOT DETECTED NOT DETECTED Final   Listeria monocytogenes NOT DETECTED NOT DETECTED Final   Staphylococcus species NOT DETECTED NOT DETECTED Final   Staphylococcus aureus (BCID) NOT DETECTED NOT DETECTED Final   Staphylococcus epidermidis NOT DETECTED NOT DETECTED Final   Staphylococcus lugdunensis NOT DETECTED NOT  DETECTED Final   Streptococcus species DETECTED (A) NOT DETECTED Final    Comment: CRITICAL RESULT CALLED TO, READ BACK BY AND VERIFIED WITH: PHARMD STEVEN H 1457 [id number] FCP    Streptococcus agalactiae DETECTED (A) NOT DETECTED Final    Comment: CRITICAL RESULT CALLED TO, READ BACK BY AND VERIFIED WITH: PHARMD STEVEN H 1457 [id number] FCP    Streptococcus pneumoniae NOT DETECTED NOT DETECTED Final   Streptococcus pyogenes NOT DETECTED NOT DETECTED Final   A.calcoaceticus-baumannii NOT DETECTED NOT DETECTED Final   Bacteroides fragilis NOT DETECTED NOT DETECTED Final  Enterobacterales NOT DETECTED NOT DETECTED Final   Enterobacter cloacae complex NOT DETECTED NOT DETECTED Final   Escherichia coli NOT DETECTED NOT DETECTED Final   Klebsiella aerogenes NOT DETECTED NOT DETECTED Final   Klebsiella oxytoca NOT DETECTED NOT DETECTED Final   Klebsiella pneumoniae NOT DETECTED NOT DETECTED Final   Proteus species NOT DETECTED NOT DETECTED Final   Salmonella species NOT DETECTED NOT DETECTED Final   Serratia marcescens NOT DETECTED NOT DETECTED Final   Haemophilus influenzae NOT DETECTED NOT DETECTED Final   Neisseria meningitidis NOT DETECTED NOT DETECTED Final   Pseudomonas aeruginosa NOT DETECTED NOT DETECTED Final   Stenotrophomonas maltophilia NOT DETECTED NOT DETECTED Final   Candida albicans NOT DETECTED NOT DETECTED Final   Candida auris NOT DETECTED NOT DETECTED Final   Candida glabrata NOT DETECTED NOT DETECTED Final   Candida krusei NOT DETECTED NOT DETECTED Final   Candida parapsilosis NOT DETECTED NOT DETECTED Final   Candida tropicalis NOT DETECTED NOT DETECTED Final   Cryptococcus neoformans/gattii NOT DETECTED NOT DETECTED Final    Comment: Performed at [facility], [place] [address], [place], [place] [address]  Resp Panel by RT-PCR (Flu A&B, Covid) Anterior Nasal Swab     Status: None   Collection Time: 07/28/21 10:51 PM   Specimen: Anterior Nasal Swab  Result Value Ref  Range Status   SARS Coronavirus 2 by RT PCR NEGATIVE NEGATIVE Final    Comment: (NOTE) SARS-CoV-2 target nucleic acids are NOT DETECTED.  The SARS-CoV-2 RNA is generally detectable in upper respiratory specimens during the acute phase of infection. The lowest concentration of SARS-CoV-2 viral copies this assay can detect is 138 copies/mL. A negative result does not preclude SARS-Cov-2 infection and should not be used as the sole basis for treatment or other patient management decisions. A negative result may occur with  improper specimen collection/handling, submission of specimen other than nasopharyngeal swab, presence of viral mutation(s) within the areas targeted by this assay, and inadequate number of viral copies(<138 copies/mL). A negative result must be combined with clinical observations, patient history, and epidemiological information. The expected result is Negative.  Fact Sheet for Patients:  EntrepreneurPulse.com.au  Fact Sheet for Healthcare Providers:  IncredibleEmployment.be  This test is no t yet approved or cleared by the [place] FDA and  has been authorized for detection and/or diagnosis of SARS-CoV-2 by FDA under an Emergency Use Authorization (EUA). This EUA will remain  in effect (meaning this test can be used) for the duration of the COVID-19 declaration under Section 564(b)(1) of the Act, 21 U.S.C.section 360bbb-3(b)(1), unless the authorization is terminated  or revoked sooner.       Influenza A by PCR NEGATIVE NEGATIVE Final   Influenza B by PCR NEGATIVE NEGATIVE Final    Comment: (NOTE) The Xpert Xpress SARS-CoV-2/FLU/RSV plus assay is intended as an aid in the diagnosis of influenza from Nasopharyngeal swab specimens and should not be used as a sole basis for treatment. Nasal washings and aspirates are unacceptable for Xpert Xpress SARS-CoV-2/FLU/RSV testing.  Fact Sheet for  Patients: EntrepreneurPulse.com.au  Fact Sheet for Healthcare Providers: IncredibleEmployment.be  This test is not yet approved or cleared by the [place] FDA and has been authorized for detection and/or diagnosis of SARS-CoV-2 by FDA under an Emergency Use Authorization (EUA). This EUA will remain in effect (meaning this test can be used) for the duration of the COVID-19 declaration under Section 564(b)(1) of the Act, 21 U.S.C. section 360bbb-3(b)(1), unless the authorization is terminated  or revoked.  Performed at [facility], [address] [place], [place] [address]   Urine Culture     Status: None   Collection Time: 07/29/21  2:19 AM   Specimen: Urine, Clean Catch  Result Value Ref Range Status   Specimen Description   Final    URINE, CLEAN CATCH Performed at [facility], [address] [place], [place] [address]    Special Requests   Final    NONE Performed at [facility], [address] [place], [place] [address]    Culture   Final    NO GROWTH Performed at [facility], [place] [address], [place], [place] [address]    Report Status 07/31/2021 FINAL  Final  MRSA Next Gen by PCR, Nasal     Status: None   Collection Time: 07/29/21 10:27 AM   Specimen: Nasal Mucosa; Nasal Swab  Result Value Ref Range Status   MRSA by PCR Next Gen NOT DETECTED NOT DETECTED Final    Comment: (NOTE) The GeneXpert MRSA Assay (FDA approved for NASAL specimens only), is one component of a comprehensive MRSA colonization surveillance program. It is not intended to diagnose MRSA infection nor to guide or monitor treatment for MRSA infections. Test performance is not FDA approved in patients less than 85 years old. Performed at [facility], [address] [place], [place] [address]   Aerobic/Anaerobic Culture w Gram Stain (surgical/deep wound)     Status: None (Preliminary result)   Collection Time: 08/02/21  2:25 PM   Specimen: Gallbladder;  Bile  Result Value Ref Range Status   Specimen Description   Final    GALL BLADDER Performed at [facility], [address], [place], [place] [address]    Special Requests   Final    NONE Performed at [facility], [address], [place], [place] [address]    Gram Stain   Final    NO WBC SEEN FEW GRAM POSITIVE COCCI IN PAIRS FEW GRAM NEGATIVE RODS MODERATE BRANCHING, BEADING  GRAM POSITIVE RODS    Culture   Final    CULTURE REINCUBATED FOR BETTER GROWTH Performed at [facility], [place] [address], [place], [place] [address]    Report Status PENDING  Incomplete     Scheduled Meds:  aspirin  300 mg Rectal Daily   budesonide (PULMICORT) nebulizer solution  0.5 mg Nebulization BID   Carbidopa-Levodopa ER  1 tablet Oral Q supper   Carbidopa-Levodopa ER  2 tablet Oral TID   Chlorhexidine Gluconate Cloth  6 each Topical Q0600   metoprolol tartrate  2.5 mg Intravenous Q12H   sodium chloride flush  5-10 mL Intracatheter Q8H   Continuous Infusions:  cefTRIAXone (ROCEPHIN)  IV 2 g (08/02/21 1932)   dextrose 100 mL/hr at 08/03/21 1660    Procedures/Studies: [place] EKG SITE RITE  Result Date: 08/03/2021 If Site Rite image not attached, placement could not be confirmed due to current cardiac rhythm.  IR Perc Cholecystostomy  Result Date: 08/02/2021 INDICATION: 81 year old male referred for percutaneous cholecystostomy EXAM: CHOLECYSTOSTOMY MEDICATIONS: 500 mg levofloxacin; The antibiotic was administered within an appropriate time frame prior to the initiation of the procedure. ANESTHESIA/SEDATION: Moderate (conscious) sedation was employed during this procedure. A total of Versed 0.5 mg and Fentanyl 25 mcg was administered intravenously. Moderate Sedation Time: 11 minutes. The patient's level of consciousness and vital signs were monitored continuously by radiology nursing throughout the procedure under my direct supervision. FLUOROSCOPY TIME:  Fluoroscopy Time: 0 minutes 24  seconds (3  mGy). COMPLICATIONS: None PROCEDURE: Informed written consent was obtained from the patient and the patient's family after a thorough discussion of the procedural risks, benefits and alternatives. All questions were addressed. Maximal Sterile Barrier Technique was utilized including caps, mask, sterile gowns, sterile gloves, sterile drape, hand hygiene and skin antiseptic. A timeout was performed prior to the initiation of the procedure. Ultrasound survey of the right upper quadrant was performed for planning purposes. Once the patient is prepped and draped in the usual sterile fashion, the skin and subcutaneous tissues overlying the gallbladder were generously infiltrated 1% lidocaine for local anesthesia. A coaxial needle was advanced under ultrasound guidance through the skin subcutaneous tissues and a small segment of liver into the gallbladder lumen. With removal of the stylet, spontaneous dark bile drainage occurred. Using modified Seldinger technique, a 10 French drain was placed into the gallbladder fossa, with aspiration of the sample for the lab. Contrast injection confirmed position of the tube within the gallbladder lumen. Drainage catheter was attached to gravity drain with a suture retention placed. Patient tolerated the procedure well and remained hemodynamically stable throughout. No complications were encountered and no significant blood loss encountered. IMPRESSION: Status post percutaneous cholecystostomy Signed, Dulcy Fanny. Nadene Rubins, RPVI [facility] Electronically Signed   By: Corrie Mckusick D.O.   On: 08/02/2021 14:33   NM Hepatobiliary Liver Func  Result Date: 08/01/2021 CLINICAL DATA:  Concern for cholecystitis. EXAM: NUCLEAR MEDICINE HEPATOBILIARY IMAGING TECHNIQUE: Sequential images of the abdomen were obtained out to 60 minutes following intravenous administration of radiopharmaceutical. 3 mm IV morphine administered to  augment filling of the gallbladder. RADIOPHARMACEUTICALS:  5.5 mCi Tc-41m Choletec IV COMPARISON:  MRI 07/29/2021 FINDINGS: Uniform accumulation radiotracer within the liver. Counts are evident within the small bowel by 20 minutes. The gallbladder fails to fill at 60 minutes. IV morphine was administered to augment filling of the gallbladder. Post morphine injection, the gallbladder fails to fill. IMPRESSION: 1. Non filling of the gallbladder is consistent with obstruction of cystic duct / acute cholecystitis. 2. Patent common bile duct. These results will be called to the ordering clinician or representative by the Radiologist Assistant, and communication documented in the PACS or [employer] Electronically Signed   By: SSuzy BouchardM.D.   On: 08/01/2021 11:52   CT HEAD WO CONTRAST (5MM)  Result Date: 07/31/2021 CLINICAL DATA:  Altered mental status EXAM: CT HEAD WITHOUT CONTRAST TECHNIQUE: Contiguous axial images were obtained from the base of the skull through the vertex without intravenous contrast. RADIATION DOSE REDUCTION: This exam was performed according to the departmental dose-optimization program which includes automated exposure control, adjustment of the mA and/or kV according to patient size and/or use of iterative reconstruction technique. COMPARISON:  03/19/2021 FINDINGS: Brain: No acute intracranial findings are seen. There are no signs of bleeding within the cranium. Cortical sulci are prominent more so in the left cerebral hemisphere. There is no focal effacement of cortical sulci. Vascular: Unremarkable. Skull: Unremarkable. Sinuses/Orbits: There is mucosal thickening in the ethmoid sinus. Other: None. IMPRESSION: No acute intracranial findings are seen in noncontrast CT brain. Atrophy. Electronically Signed   By: PElmer PickerM.D.   On: 07/31/2021 14:40   DG Chest Port 1 View  Addendum Date: 07/30/2021   ADDENDUM REPORT: 07/30/2021 14:27 ADDENDUM: Voice recognition  error in the impression. For the first sentence, the NG tube should be described as malpositioned, not well positioned. NG tube curls in the  neck and does not extend into the thorax and will need to be replaced/repositioned. Electronically Signed   By: Lajean Manes M.D.   On: 07/30/2021 14:27   Result Date: 07/30/2021 CLINICAL DATA:  NG tube placement. EXAM: PORTABLE CHEST 1 VIEW COMPARISON:  07/29/2021. FINDINGS: NG tube is partly visualized, curled in the neck, presumably in the oropharynx. Cardiac silhouette normal in size. No mediastinal or hilar masses. Lungs demonstrate prominent bronchovascular markings, otherwise clear. No pleural effusion or pneumothorax. IMPRESSION: 1. NG tube well positioned, curled in the neck, presumably in the oropharynx. 2. No acute cardiopulmonary disease. Electronically Signed: By: Lajean Manes M.D. On: 07/30/2021 13:46   DG Abd 1 View  Result Date: 07/30/2021 CLINICAL DATA:  Gastric distension EXAM: ABDOMEN - 1 VIEW COMPARISON:  None Available. FINDINGS: The bowel gas pattern is nonobstructive. Mild gaseous distension of stomach and bowel. There are no abnormal calcifications overlying the kidneys. Pelvic phleboliths. Chronic right anterolateral tenth eleventh rib injuries. Degenerative changes of the spine with levoconvex lumbar curvature. IMPRESSION: No evidence of bowel obstruction. Electronically Signed   By: Maurine Simmering M.D.   On: 07/30/2021 11:33   MR ABDOMEN MRCP WO CONTRAST  Result Date: 07/29/2021 CLINICAL DATA:  Upper abdominal pain. Dilated common bile duct on ultrasound. EXAM: MRI ABDOMEN WITHOUT CONTRAST  (INCLUDING MRCP) TECHNIQUE: Multiplanar multisequence MR imaging of the abdomen was performed. Heavily T2-weighted images of the biliary and pancreatic ducts were obtained, and three-dimensional MRCP images were rendered by post processing. COMPARISON:  Ultrasound 07/29/2021 FINDINGS: Motion degradation exam. ICU patient. ICU patients have difficulty  following breath holding commands. Lower chest:  Small bilateral pleural effusions Hepatobiliary: No intrahepatic biliary duct dilatation. Common bile duct normal caliber. The no gallstones identified. Small amount sludge within the gallbladder. No gallbladder distension or inflammation. Pancreas: Normal pancreatic parenchymal intensity. No ductal dilatation or inflammation. Spleen: Normal spleen. Adrenals/urinary tract: Adrenal glands and kidneys are normal. Stomach/Bowel: Stomach and limited of the small bowel is unremarkable Vascular/Lymphatic: Abdominal aortic normal caliber. No retroperitoneal periportal lymphadenopathy. Musculoskeletal: No aggressive osseous lesion IMPRESSION: 1. Normal common bile duct. No intrahepatic biliary duct dilatation. 2. Small amount gallbladder sludge.  No evidence of cholecystitis. 3. Normal pancreas. 4. Bibasilar atelectasis. 5. Imaging degraded by respiratory motion. Electronically Signed   By: Suzy Bouchard M.D.   On: 07/29/2021 19:59   MR 3D Recon At Scanner  Result Date: 07/29/2021 CLINICAL DATA:  Upper abdominal pain. Dilated common bile duct on ultrasound. EXAM: MRI ABDOMEN WITHOUT CONTRAST  (INCLUDING MRCP) TECHNIQUE: Multiplanar multisequence MR imaging of the abdomen was performed. Heavily T2-weighted images of the biliary and pancreatic ducts were obtained, and three-dimensional MRCP images were rendered by post processing. COMPARISON:  Ultrasound 07/29/2021 FINDINGS: Motion degradation exam. ICU patient. ICU patients have difficulty following breath holding commands. Lower chest:  Small bilateral pleural effusions Hepatobiliary: No intrahepatic biliary duct dilatation. Common bile duct normal caliber. The no gallstones identified. Small amount sludge within the gallbladder. No gallbladder distension or inflammation. Pancreas: Normal pancreatic parenchymal intensity. No ductal dilatation or inflammation. Spleen: Normal spleen. Adrenals/urinary tract: Adrenal glands  and kidneys are normal. Stomach/Bowel: Stomach and limited of the small bowel is unremarkable Vascular/Lymphatic: Abdominal aortic normal caliber. No retroperitoneal periportal lymphadenopathy. Musculoskeletal: No aggressive osseous lesion IMPRESSION: 1. Normal common bile duct. No intrahepatic biliary duct dilatation. 2. Small amount gallbladder sludge.  No evidence of cholecystitis. 3. Normal pancreas. 4. Bibasilar atelectasis. 5. Imaging degraded by respiratory motion. Electronically Signed   By: Suzy Bouchard  M.D.   On: 07/29/2021 19:59   DG CHEST PORT 1 VIEW  Result Date: 07/29/2021 CLINICAL DATA:  Shortness of breath.  Chest pain.  Diaphoresis. EXAM: PORTABLE CHEST 1 VIEW COMPARISON:  07/29/2021 FINDINGS: Low lung volumes are present, causing crowding of the pulmonary vasculature. There is likely mild subsegmental atelectasis along both hemidiaphragms. Mild cardiomegaly. Prominence of the right upper mediastinal margin, cannot exclude adenopathy or nodule. IMPRESSION: 1. Low lung volumes and suspected mild atelectasis at the lung bases. 2. Somewhat nodular prominence the right upper mediastinum, cannot exclude adenopathy or nodule although this might possibly be from tortuous vasculature. Chest CT could be utilized for further characterization. 3. Mild cardiomegaly. Electronically Signed   By: Van Clines M.D.   On: 07/29/2021 10:05   US Abdomen Limited RUQ (LIVER/GB)  Result Date: 07/29/2021 CLINICAL DATA:  Abnormal LFTs EXAM: ULTRASOUND ABDOMEN LIMITED RIGHT UPPER QUADRANT COMPARISON:  CT abdomen and pelvis 07/28/2021 FINDINGS: Gallbladder: Moderately distended and contains echogenic calculi and sludge dependently. 5 mm echogenic likely polyp at the fundus. No significant wall thickening or pericholecystic fluid identified. Common bile duct: Diameter: 8 mm, upper normal for the patient's age. Liver: No focal lesion identified. Within normal limits in parenchymal echogenicity. Portal vein is  patent on color Doppler imaging with normal direction of blood flow towards the liver. Other: None. IMPRESSION: 1. Cholelithiasis and sludge. 2. 5 mm likely polyp in the gallbladder. 3. Upper normal caliber of the common bile duct and moderate distention of the gallbladder. Correlate clinically and consider MRCP if indicated. Electronically Signed   By: Ofilia Neas M.D.   On: 07/29/2021 09:56   DG CHEST PORT 1 VIEW  Result Date: 07/29/2021 CLINICAL DATA:  Wheezing EXAM: PORTABLE CHEST 1 VIEW COMPARISON:  07/28/2021 FINDINGS: Limited low volume chest with generalized interstitial coarsening/crowding. No Kerley lines or pleural effusion. Normal heart size and mediastinal contours. Extensive artifact from EKG leads IMPRESSION: Limited low volume chest that is stable from yesterday Electronically Signed   By: Jorje Guild M.D.   On: 07/29/2021 06:48   DG Wrist Complete Left  Result Date: 07/29/2021 CLINICAL DATA:  Wrist pain EXAM: LEFT WRIST - COMPLETE 3+ VIEW COMPARISON:  None Available. FINDINGS: No fracture or malalignment. Vascular calcifications. Advanced joint space narrowing of the radiocarpal joint. No erosions. IMPRESSION: 1. 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 2. No acute osseous abnormality Electronically Signed   By: Donavan Foil M.D.   On: 07/29/2021 00:09   CT ABDOMEN PELVIS WO CONTRAST  Result Date: 07/28/2021 CLINICAL DATA:  Abdominal pain EXAM: CT ABDOMEN AND PELVIS WITHOUT CONTRAST TECHNIQUE: Multidetector CT imaging of the abdomen and pelvis was performed following the standard protocol without IV contrast. RADIATION DOSE REDUCTION: This exam was performed according to the departmental dose-optimization program which includes automated exposure control, adjustment of the mA and/or kV according to patient size and/or use of iterative reconstruction technique. COMPARISON:  07/26/2021 FINDINGS: Lower chest: Small pleural effusions are noted bilaterally slightly increased when compared  with the prior exam. Mild bibasilar atelectasis is seen. Hepatobiliary: Liver is within normal limits. Dependent gallstones are noted within the gallbladder which is well distended. Pneumobilia is again identified consistent with the given clinical history of sphincterotomy. Pancreas: Unremarkable. No pancreatic ductal dilatation or surrounding inflammatory changes. Spleen: Normal in size without focal abnormality. Adrenals/Urinary Tract: Adrenal glands are within normal limits bilaterally. Kidneys show no tiny nonobstructing renal stone on the right. No definitive stones are noted on the left. Hypodense lesion is noted within  the left kidney measuring approximately 1.5 cm consistent with small cysts stable in appearance from the prior exam. No obstructive changes are seen. The bladder is well distended with mildly opacified urine. A portion of the bladder extends into a large right sided inguinal hernia. This is stable in appearance from the prior exam. Stomach/Bowel: Scattered fecal material is noted throughout the colon. Mild diverticular change is seen. The appendix has been surgically removed consistent with the given clinical history. Small bowel and stomach are unremarkable. Vascular/Lymphatic: Aortic atherosclerosis. No enlarged abdominal or pelvic lymph nodes. Reproductive: Prostate is unremarkable. Other: No ascites is noted. Bilateral fat containing inguinal hernias are seen. Persistent herniated bladder into the right hernia is noted. Musculoskeletal: Degenerative changes of lumbar spine are seen. IMPRESSION: Small nonobstructing right renal stone. Bilateral inguinal hernias with herniated bladder on the right stable from the prior study. Multiple small gallstones without complicating factors. No other focal abnormality is noted. Electronically Signed   By: Inez Catalina M.D.   On: 07/28/2021 23:58   DG Chest Port 1 View  Result Date: 07/28/2021 CLINICAL DATA:  Respiratory distress EXAM: PORTABLE CHEST  1 VIEW COMPARISON:  07/26/2021 FINDINGS: Hypoventilatory changes. No consolidation or effusion. Stable cardiomediastinal silhouette. No pneumothorax IMPRESSION: No active disease.  Low lung volumes. Electronically Signed   By: Donavan Foil M.D.   On: 07/28/2021 22:42   DG Chest Port 1 View  Result Date: 07/26/2021 CLINICAL DATA:  leukocytosis, back pain, poss PNA? EXAM: PORTABLE CHEST 1 VIEW COMPARISON:  Radiograph 08/01/2018 FINDINGS: Unchanged cardiomediastinal silhouette. Low lung volumes. No focal airspace disease. No pleural effusion. No pneumothorax. 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 IMPRESSION: Low lung volumes.  No focal airspace disease. High-riding humeral heads bilaterally can be seen in distal rotator cuff tendinopathy. Electronically Signed   By: Maurine Simmering M.D.   On: 07/26/2021 14:09   CT Abdomen Pelvis W Contrast  Result Date: 07/26/2021 CLINICAL DATA:  Abdominal pain, acute, nonlocalized dementia, prior abd surgery, back? pain, diaphoresis, concern for aorta vs. nephrolith EXAM: CT ABDOMEN AND PELVIS WITH CONTRAST TECHNIQUE: Multidetector CT imaging of the abdomen and pelvis was performed using the standard protocol following bolus administration of intravenous contrast. RADIATION DOSE REDUCTION: This exam was performed according to the departmental dose-optimization program which includes automated exposure control, adjustment of the mA and/or kV according to patient size and/or use of iterative reconstruction technique. CONTRAST:  76m OMNIPAQUE IOHEXOL 300 MG/ML  SOLN COMPARISON:  CT 12/23/2015 FINDINGS: Lower chest: No acute abnormality. Hepatobiliary: No focal liver abnormality is seen. Mild gallbladder distension with layering sludge or tiny stones. There is pneumobilia consistent with history of prior ERCP and sphincterotomy. Pancreas: Unremarkable. No pancreatic ductal dilatation or surrounding inflammatory changes. Spleen: Normal in size without  focal abnormality. Adrenals/Urinary Tract: Adrenal glands are unremarkable. No hydronephrosis or nephrolithiasis. There are nonobstructive bilateral renal stones. Unchanged bilateral renal cysts. There is a right inguinal hernia containing herniated bladder. Stomach/Bowel: The stomach is within normal limits. There is no evidence of bowel obstruction.Prior appendectomy. Scattered colonic diverticula. No diverticulitis. Moderate rectal stool burden. Vascular/Lymphatic: Aortoiliac atherosclerosis. No AAA. No lymphadenopathy. Reproductive: Unremarkable. Other: Right inguinal hernia containing hernia bladder. Fat containing left inguinal hernia. Unchanged anterior abdominal wall scarring. Musculoskeletal: Levoconvex upper lumbar and dextroconvex lower lumbar curvatures. 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 There is severe multilevel facet arthropathy. Mild bilateral hip osteoarthritis. IMPRESSION: Nonobstructive 3 mm renal stones bilaterally. No hydronephrosis or ureterolithiasis. Right  inguinal hernia containing distended herniated bladder. Mild gallbladder is tension with layering sludge or small stones. No other findings to suggest cholecystitis by CT. 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 Aortoiliac atherosclerosis.  No AAA. Moderate rectal stool burden. Electronically Signed   By: Maurine Simmering M.D.   On: 07/26/2021 10:10    Orson Eva, DO  [facility]  If 7PM-7AM, please contact night-coverage [email or web address] Password [email or web address] 08/03/2021, 2:18 PM   LOS: 5 days

## 2021-08-03 NOTE — Progress Notes (Signed)
Peripherally Inserted Central Catheter Placement  The IV Nurse has discussed with the patient and/or persons authorized to consent for the patient, the purpose of this procedure and the potential benefits and risks involved with this procedure.  The benefits include less needle sticks, lab draws from the catheter, and the patient may be discharged home with the catheter. Risks include, but not limited to, infection, bleeding, blood clot (thrombus formation), and puncture of an artery; nerve damage and irregular heartbeat and possibility to perform a PICC exchange if needed/ordered by physician.  Alternatives to this procedure were also discussed.  Bard Power PICC patient education guide, fact sheet on infection prevention and patient information card has been provided to patient /or left at bedside.    PICC Placement Documentation  PICC Double Lumen 00/37/04 Right Basilic 41 cm 0 cm (Active)  Indication for Insertion or Continuance of Line Limited venous access - need for IV therapy >5 days (PICC only) 08/03/21 1817  Exposed Catheter (cm) 0 cm 08/03/21 1817  Site Assessment Clean, Dry, Intact 08/03/21 1817  Lumen #1 Status Flushed;Saline locked;Blood return noted 08/03/21 1817  Lumen #2 Status Flushed;Saline locked;Blood return noted 08/03/21 1817  Dressing Type Transparent;Securing device 08/03/21 1817  Dressing Status Antimicrobial disc in place 08/03/21 1817  Dressing Intervention New dressing;Other (Comment) 08/03/21 1817  Dressing Change Due 08/10/21 08/03/21 1817   Telephone consent signed by wife    Christella Noa Albarece 08/03/2021, 6:18 PM

## 2021-08-03 NOTE — Progress Notes (Addendum)
Supervising Physician: Aletta Edouard  Patient Status:  [facility]  Chief Complaint:  1 day s/p perc chole  Subjective:  Resting, does not wake to voice, moans with nearly every exhale RN reports patient looking better than today.  Was awake and interacting with family this morning.  RN reports poor drainage overnight, but after flushing this morning drainage resumed.  Allergies: Aspirin, Coconut (cocos nucifera), and Penicillins  Medications: Prior to Admission medications   Medication Sig Start Date End Date Taking? Authorizing Provider  aspirin 81 MG tablet Take 81 mg by mouth daily.   Yes [provider]  Azelastine HCl 0.15 % SOLN Place 1 spray into both nostrils daily as needed (congestion).  08/18/15  Yes [provider]  Carbidopa-Levodopa ER (SINEMET CR) 25-100 MG tablet controlled release TAKE TWO TABLETS BY MOUTH EVERY MORNING, TWO TABLETS AT LUNCH, ONE TABLET AT dinner, AND two TABLET AT BEDTIME Patient taking differently: Take 1-2 tablets by mouth in the morning, at noon, in the evening, and at bedtime. 2 tabs in the am, 2 tabs at lunch, 1 tab at dinner, AND 2 tabs at bedtime 06/27/21  Yes Ward Givens, NP  cyclobenzaprine (FLEXERIL) 10 MG tablet Take 10 mg by mouth as needed for muscle spasms.   Yes [provider]  escitalopram (LEXAPRO) 10 MG tablet Take 5 mg by mouth daily. 07/30/19  Yes [provider]  furosemide (LASIX) 20 MG tablet Take 20 mg by mouth as needed for fluid.   Yes [provider]  LORazepam (ATIVAN) 1 MG tablet Take 1 mg by mouth at bedtime. 07/11/21  Yes [provider]  meloxicam (MOBIC) 15 MG tablet Take 15 mg by mouth daily.   Yes [provider]  metFORMIN (GLUCOPHAGE) 500 MG tablet Take 500 mg by mouth daily with breakfast.  10/05/12  Yes [provider]  metoprolol tartrate (LOPRESSOR) 25 MG tablet Take 12.5 mg by mouth 2 (two) times daily.   Yes [provider]   OLANZapine (ZYPREXA) 10 MG tablet Take 1 tablet (10 mg total) by mouth at bedtime. 06/16/21  Yes White, Aaron Edelman A, NP  pantoprazole (PROTONIX) 40 MG tablet Take 40 mg by mouth daily. 03/08/14  Yes [provider]  potassium chloride SA (K-DUR,KLOR-CON) 20 MEQ tablet Take 20 mEq by mouth as needed (cramps). 10/24/13  Yes [provider]  pramipexole (MIRAPEX) 0.75 MG tablet TAKE ONE TABLET BY MOUTH THREE TIMES DAILY Patient taking differently: Take 0.75 mg by mouth 3 (three) times daily. 01/19/20  Yes Kathrynn Ducking, MD  QUEtiapine (SEROQUEL) 25 MG tablet TAKE ONE TABLET BY MOUTH AT 10am, 4pm, AND 2 tablets at 9pm daily AS DIRECTED Patient taking differently: Take 25-50 mg by mouth 3 (three) times daily. 1 tablet at 10am, 4pm, AND 2 tablets at 9pm daily 07/06/21  Yes White, Louanna Raw, NP  selegiline (ELDEPRYL) 5 MG tablet Take 5 mg by mouth 2 (two) times daily with a meal.   Yes [provider]  simvastatin (ZOCOR) 40 MG tablet Take 20 mg by mouth at bedtime.  09/05/12  Yes [provider]  valsartan-hydrochlorothiazide (DIOVAN-HCT) 80-12.5 MG tablet Take 1 tablet by mouth daily.   Yes [provider]     Vital Signs: BP (!) 150/60   Pulse 84   Temp 99.5 F (37.5 C) (Axillary)   Resp 18   Ht '5\' 10"'$  (1.778 m)   Wt 213 lb 6.5 oz (96.8 kg)   SpO2 93%  BMI 30.62 kg/m   Physical Exam Vitals reviewed.  Constitutional:      General: He is sleeping.     Appearance: He is ill-appearing.  HENT:     Mouth/Throat:     Mouth: Mucous membranes are dry.     Pharynx: Oropharynx is clear.  Cardiovascular:     Rate and Rhythm: Normal rate.  Abdominal:     Palpations: Abdomen is soft.  Skin:    General: Skin is warm and dry.     Coloration: Skin is pale.   Drain Location: RUQ Size: Fr size: 10 Fr Date of placement: 08/02/21 Currently to: Drain collection device: gravity 24 hour output:  Output by Drain (mL) 08/01/21 0700 - 08/01/21 1459 08/01/21 1500  - 08/01/21 2259 08/01/21 2300 - 08/02/21 0659 08/02/21 0700 - 08/02/21 1459 08/02/21 1500 - 08/02/21 2259 08/02/21 2300 - 08/03/21 0659 08/03/21 0700 - 08/03/21 1021  Biliary Tube Cook slip-coat 10 Fr. RUQ     75      Current examination: Flushes/aspirates easily.  Insertion site unremarkable. Suture in place. Dressed appropriately.  100cc bilious OP in bag  Imaging: IR Perc Cholecystostomy  Result Date: 08/02/2021 INDICATION: 81 year old male referred for percutaneous cholecystostomy EXAM: CHOLECYSTOSTOMY MEDICATIONS: 500 mg levofloxacin; The antibiotic was administered within an appropriate time frame prior to the initiation of the procedure. ANESTHESIA/SEDATION: Moderate (conscious) sedation was employed during this procedure. A total of Versed 0.5 mg and Fentanyl 25 mcg was administered intravenously. Moderate Sedation Time: 11 minutes. The patient's level of consciousness and vital signs were monitored continuously by radiology nursing throughout the procedure under my direct supervision. FLUOROSCOPY TIME:  Fluoroscopy Time: 0 minutes 24 seconds (3 mGy). COMPLICATIONS: None PROCEDURE: Informed written consent was obtained from the patient and the patient's family after a thorough discussion of the procedural risks, benefits and alternatives. All questions were addressed. Maximal Sterile Barrier Technique was utilized including caps, mask, sterile gowns, sterile gloves, sterile drape, hand hygiene and skin antiseptic. A timeout was performed prior to the initiation of the procedure. Ultrasound survey of the right upper quadrant was performed for planning purposes. Once the patient is prepped and draped in the usual sterile fashion, the skin and subcutaneous tissues overlying the gallbladder were generously infiltrated 1% lidocaine for local anesthesia. A coaxial needle was advanced under ultrasound guidance through the skin subcutaneous tissues and a small segment of liver into the gallbladder  lumen. With removal of the stylet, spontaneous dark bile drainage occurred. Using modified Seldinger technique, a 10 French drain was placed into the gallbladder fossa, with aspiration of the sample for the lab. Contrast injection confirmed position of the tube within the gallbladder lumen. Drainage catheter was attached to gravity drain with a suture retention placed. Patient tolerated the procedure well and remained hemodynamically stable throughout. No complications were encountered and no significant blood loss encountered. IMPRESSION: Status post percutaneous cholecystostomy Signed, Dulcy Fanny. Nadene Rubins, RPVI [facility] Radiology Electronically Signed   By: Corrie Mckusick D.O.   On: 08/02/2021 14:33   NM Hepatobiliary Liver Func  Result Date: 08/01/2021 CLINICAL DATA:  Concern for cholecystitis. EXAM: NUCLEAR MEDICINE HEPATOBILIARY IMAGING TECHNIQUE: Sequential images of the abdomen were obtained out to 60 minutes following intravenous administration of radiopharmaceutical. 3 mm IV morphine administered to augment filling of the gallbladder. RADIOPHARMACEUTICALS:  5.5 mCi Tc-106m Choletec IV COMPARISON:  MRI 07/29/2021 FINDINGS: Uniform accumulation radiotracer within the liver. Counts are evident within the small bowel by 20  minutes. The gallbladder fails to fill at 60 minutes. IV morphine was administered to augment filling of the gallbladder. Post morphine injection, the gallbladder fails to fill. IMPRESSION: 1. Non filling of the gallbladder is consistent with obstruction of cystic duct / acute cholecystitis. 2. Patent common bile duct. These results will be called to the ordering clinician or representative by the Radiologist Assistant, and communication documented in the PACS or [employer]. Electronically Signed   By: Suzy Bouchard M.D.   On: 08/01/2021 11:52   CT HEAD WO CONTRAST (5MM)  Result Date: 07/31/2021 CLINICAL DATA:  Altered  mental status EXAM: CT HEAD WITHOUT CONTRAST TECHNIQUE: Contiguous axial images were obtained from the base of the skull through the vertex without intravenous contrast. RADIATION DOSE REDUCTION: This exam was performed according to the departmental dose-optimization program which includes automated exposure control, adjustment of the mA and/or kV according to patient size and/or use of iterative reconstruction technique. COMPARISON:  03/19/2021 FINDINGS: Brain: No acute intracranial findings are seen. There are no signs of bleeding within the cranium. Cortical sulci are prominent more so in the left cerebral hemisphere. There is no focal effacement of cortical sulci. Vascular: Unremarkable. Skull: Unremarkable. Sinuses/Orbits: There is mucosal thickening in the ethmoid sinus. Other: None. IMPRESSION: No acute intracranial findings are seen in noncontrast CT brain. Atrophy. Electronically Signed   By: Elmer Picker M.D.   On: 07/31/2021 14:40   DG Chest Port 1 View  Addendum Date: 07/30/2021   ADDENDUM REPORT: 07/30/2021 14:27 ADDENDUM: Voice recognition error in the impression. For the first sentence, the NG tube should be described as malpositioned, not well positioned. NG tube curls in the neck and does not extend into the thorax and will need to be replaced/repositioned. Electronically Signed   By: Lajean Manes M.D.   On: 07/30/2021 14:27   Result Date: 07/30/2021 CLINICAL DATA:  NG tube placement. EXAM: PORTABLE CHEST 1 VIEW COMPARISON:  07/29/2021. FINDINGS: NG tube is partly visualized, curled in the neck, presumably in the oropharynx. Cardiac silhouette normal in size. No mediastinal or hilar masses. Lungs demonstrate prominent bronchovascular markings, otherwise clear. No pleural effusion or pneumothorax. IMPRESSION: 1. NG tube well positioned, curled in the neck, presumably in the oropharynx. 2. No acute cardiopulmonary disease. Electronically Signed: By: Lajean Manes M.D. On: 07/30/2021  13:46   DG Abd 1 View  Result Date: 07/30/2021 CLINICAL DATA:  Gastric distension EXAM: ABDOMEN - 1 VIEW COMPARISON:  None Available. FINDINGS: The bowel gas pattern is nonobstructive. Mild gaseous distension of stomach and bowel. There are no abnormal calcifications overlying the kidneys. Pelvic phleboliths. Chronic right anterolateral tenth eleventh rib injuries. Degenerative changes of the spine with levoconvex lumbar curvature. IMPRESSION: No evidence of bowel obstruction. Electronically Signed   By: Maurine Simmering M.D.   On: 07/30/2021 11:33    Labs:  CBC: Recent Labs    07/30/21 0444 07/31/21 0330 08/01/21 0222 08/03/21 0353  WBC 13.6* 12.8* 16.7* 19.6*  HGB 10.7* 10.3* 11.7* 11.1*  HCT 30.4* 29.6* 34.2* 34.7*  PLT 75* 72* 92* 177    COAGS: Recent Labs    07/28/21 2231 07/29/21 0345  INR 1.2 1.4*    BMP: Recent Labs    07/30/21 0444 07/31/21 0330 08/01/21 1434 08/03/21 0353  NA 136 140 151* 158*  K 3.9 3.6 3.7 4.0  CL 108 113* 125* >130*  CO2 19* 18* 23 21*  GLUCOSE 128* 140* 158* 182*  BUN 84* 104* 85* 67*  CALCIUM 7.8* 7.4*  7.8* 8.0*  CREATININE 2.72* 2.55* 1.59* 1.37*  GFRNONAA 23* 25* 44* 53*    LIVER FUNCTION TESTS: Recent Labs    07/30/21 0444 07/31/21 0330 08/01/21 1434 08/03/21 0353  BILITOT 5.9* 4.4* 3.1* 2.1*  AST 68* 52* 32 23  ALT '10 12 28 22  '$ ALKPHOS 127* 137* 169* 131*  PROT 5.3* 5.2* 5.2* 5.7*  ALBUMIN 2.4* 2.1* 2.0* 1.9*    Assessment and Plan:  Cholecystitis:  WBC up to 19.6 (from 16.7)  Continue TID flushes with 5-10 cc NS. Record output Q shift. Dressing changes QD or PRN if soiled.  Call IR APP or on call IR MD if difficulty flushing or sudden change in drain output.   Discharge planning: Please contact IR APP or on call IR MD prior to patient d/c to ensure appropriate follow up plans are in place. Typically patient will follow up with IR clinic 4-6 weeks post d/c for repeat imaging/possible drain injection.  IR  scheduler will contact patient with date/time of appointment. Patient will need to flush drain QD with 5-10 cc NS, record output QD, dressing changes every 2-3 days or earlier if soiled.   IR will continue to follow - please call with questions or concerns.   Electronically Signed: Pasty Spillers, PA 08/03/2021, 10:08 AM   I spent a total of 15 Minutes at the the patient's bedside AND on the patient's hospital floor or unit, greater than 50% of which was counseling/coordinating care for acute cholecystitis.

## 2021-08-03 NOTE — Progress Notes (Signed)
Noted improvement in patient today, opening his eyes, was able to answer/respond to questions asked by this RN & family, he ate dinner that included soft mashed up carrots/mash potatoes/strawberry ice cream/grilled fish & cola, consumed 2 cups of strawberry ice cream, begun to suck on a straw to drink a few times, used oral sponge to provide water & cola & patient began to suck on sponge, reported that he enjoyed the ice cream when asked, noted swallow & licking his lips, family at bedside & assisted with feeding him, pain still better controlled at this time, 264m of bile drainage from perc chole this shift, MD made aware

## 2021-08-03 NOTE — Progress Notes (Signed)
Subjective: Patient is somnolent this morning, with pain at cholecystostomy tube site.  There is bilious drainage within the tubing.  Objective: Vital signs in last 24 hours: Pulse Rate:  [81-100] 83 (06/14 0758) Resp:  [12-25] 14 (06/14 0758) BP: (128-175)/(55-96) 128/57 (06/14 0700) SpO2:  [87 %-100 %] 92 % (06/14 0758)    Intake/Output from previous day: 06/13 0701 - 06/14 0700 In: 5 [I.V.:5] Out: 1925 [Urine:1850; Drains:75] Intake/Output this shift: No intake/output data recorded.  General appearance: fatigued GI: Pain around cholecystostomy tube exit site with bilious drainage present.  Lab Results:  Recent Labs    08/01/21 0222 08/03/21 0353  WBC 16.7* 19.6*  HGB 11.7* 11.1*  HCT 34.2* 34.7*  PLT 92* 177   BMET Recent Labs    08/01/21 1434 08/03/21 0353  NA 151* 158*  K 3.7 4.0  CL 125* >130*  CO2 23 21*  GLUCOSE 158* 182*  BUN 85* 67*  CREATININE 1.59* 1.37*  CALCIUM 7.8* 8.0*   PT/INR No results for input(s): "LABPROT", "INR" in the last 72 hours.  Studies/Results: IR Perc Cholecystostomy  Result Date: 08/02/2021 INDICATION: 81 year old male referred for percutaneous cholecystostomy EXAM: CHOLECYSTOSTOMY MEDICATIONS: 500 mg levofloxacin; The antibiotic was administered within an appropriate time frame prior to the initiation of the procedure. ANESTHESIA/SEDATION: Moderate (conscious) sedation was employed during this procedure. A total of Versed 0.5 mg and Fentanyl 25 mcg was administered intravenously. Moderate Sedation Time: 11 minutes. The patient's level of consciousness and vital signs were monitored continuously by radiology nursing throughout the procedure under my direct supervision. FLUOROSCOPY TIME:  Fluoroscopy Time: 0 minutes 24 seconds (3 mGy). COMPLICATIONS: None PROCEDURE: Informed written consent was obtained from the patient and the patient's family after a thorough discussion of the procedural risks, benefits and alternatives. All  questions were addressed. Maximal Sterile Barrier Technique was utilized including caps, mask, sterile gowns, sterile gloves, sterile drape, hand hygiene and skin antiseptic. A timeout was performed prior to the initiation of the procedure. Ultrasound survey of the right upper quadrant was performed for planning purposes. Once the patient is prepped and draped in the usual sterile fashion, the skin and subcutaneous tissues overlying the gallbladder were generously infiltrated 1% lidocaine for local anesthesia. A coaxial needle was advanced under ultrasound guidance through the skin subcutaneous tissues and a small segment of liver into the gallbladder lumen. With removal of the stylet, spontaneous dark bile drainage occurred. Using modified Seldinger technique, a 10 French drain was placed into the gallbladder fossa, with aspiration of the sample for the lab. Contrast injection confirmed position of the tube within the gallbladder lumen. Drainage catheter was attached to gravity drain with a suture retention placed. Patient tolerated the procedure well and remained hemodynamically stable throughout. No complications were encountered and no significant blood loss encountered. IMPRESSION: Status post percutaneous cholecystostomy Signed, Dulcy Fanny. Nadene Rubins, RPVI [facility] Radiology Electronically Signed   By: Corrie Mckusick D.O.   On: 08/02/2021 14:33   NM Hepatobiliary Liver Func  Result Date: 08/01/2021 CLINICAL DATA:  Concern for cholecystitis. EXAM: NUCLEAR MEDICINE HEPATOBILIARY IMAGING TECHNIQUE: Sequential images of the abdomen were obtained out to 60 minutes following intravenous administration of radiopharmaceutical. 3 mm IV morphine administered to augment filling of the gallbladder. RADIOPHARMACEUTICALS:  5.5 mCi Tc-76m Choletec IV COMPARISON:  MRI 07/29/2021 FINDINGS: Uniform accumulation radiotracer within the liver. Counts are evident within  the small bowel by 20 minutes. The gallbladder fails to fill at 60 minutes.  IV morphine was administered to augment filling of the gallbladder. Post morphine injection, the gallbladder fails to fill. IMPRESSION: 1. Non filling of the gallbladder is consistent with obstruction of cystic duct / acute cholecystitis. 2. Patent common bile duct. These results will be called to the ordering clinician or representative by the Radiologist Assistant, and communication documented in the PACS or [employer]. Electronically Signed   By: Suzy Bouchard M.D.   On: 08/01/2021 11:52    Anti-infectives: Anti-infectives (From admission, onward)    Start     Dose/Rate Route Frequency Ordered Stop   07/31/21 2000  cefTRIAXone (ROCEPHIN) 2 g in sodium chloride 0.9 % 100 mL IVPB        2 g 200 mL/hr over 30 Minutes Intravenous Every 24 hours 07/31/21 1105     07/31/21 1000  ceFEPIme (MAXIPIME) 2 g in sodium chloride 0.9 % 100 mL IVPB  Status:  Discontinued        2 g 200 mL/hr over 30 Minutes Intravenous Every 24 hours 07/30/21 1035 07/31/21 1105   07/29/21 2200  ceFEPIme (MAXIPIME) 2 g in sodium chloride 0.9 % 100 mL IVPB  Status:  Discontinued        2 g 200 mL/hr over 30 Minutes Intravenous Every 24 hours 07/29/21 0102 07/29/21 1613   07/29/21 1800  ceFEPIme (MAXIPIME) 2 g in sodium chloride 0.9 % 100 mL IVPB  Status:  Discontinued        2 g 200 mL/hr over 30 Minutes Intravenous Every 12 hours 07/29/21 1613 07/30/21 1035   07/29/21 1200  metroNIDAZOLE (FLAGYL) IVPB 500 mg  Status:  Discontinued        500 mg 100 mL/hr over 60 Minutes Intravenous Every 12 hours 07/29/21 0044 07/31/21 1105   07/29/21 0903  vancomycin variable dose per unstable renal function (pharmacist dosing)  Status:  Discontinued         Does not apply See admin instructions 07/29/21 0903 07/29/21 1552   07/28/21 2345  vancomycin (VANCOREADY) IVPB 2000 mg/400 mL  Status:  Discontinued        2,000 mg 200 mL/hr over 120 Minutes  Intravenous  Once 07/28/21 2334 07/29/21 0247   07/28/21 2300  ceFEPIme (MAXIPIME) 2 g in sodium chloride 0.9 % 100 mL IVPB        2 g 200 mL/hr over 30 Minutes Intravenous  Once 07/28/21 2245 07/28/21 2356   07/28/21 2300  metroNIDAZOLE (FLAGYL) IVPB 500 mg        500 mg 100 mL/hr over 60 Minutes Intravenous  Once 07/28/21 2245 07/29/21 0147   07/28/21 2300  vancomycin (VANCOCIN) IVPB 1000 mg/200 mL premix  Status:  Discontinued        1,000 mg 200 mL/hr over 60 Minutes Intravenous  Once 07/28/21 2245 07/28/21 2333       Assessment/Plan: Impression: Status post cholecystostomy tube placement.  Still with leukocytosis.  Hyperbilirubinemia decreasing.  LFTs within normal limits.  We will follow peripherally with you.  LOS: 5 days    Aviva Signs 08/03/2021

## 2021-08-03 NOTE — Evaluation (Signed)
Clinical/Bedside Swallow Evaluation Patient Details  Name: Elijah [name] MRN: [id number] Date of Birth: [birth year]  Today's Date: 08/03/2021 Time: SLP Start Time (ACUTE ONLY): 1213 SLP Stop Time (ACUTE ONLY): 1236 SLP Time Calculation (min) (ACUTE ONLY): 23 min  Past Medical History:  Past Medical History:  Diagnosis Date   Chronic low back pain 03/24/2015   Degenerative arthritis    Diabetes mellitus without complication ([place])    Dyslipidemia    Glaucoma    Gout 10/07/2012   Hypertension    Low back pain    Obesity    OSA (obstructive sleep apnea) 04/10/2014   cannot tolerate, PCP aware.   Pancreatitis    Parkinson disease ([place])    Rotator cuff tear    Bilateral, no surgery   Past Surgical History:  Past Surgical History:  Procedure Laterality Date   AMPUTATION Right 01/31/2018   Procedure: AMPUTATION RIGHT THIRD TOE;  Surgeon: Caprice Beaver, DPM;  Location: [facility];  Service: Podiatry;  Laterality: Right;   APPENDECTOMY     BALLOON DILATION N/A 09/06/2015   Procedure: BALLOON DILATION;  Surgeon: Daneil Dolin, MD;  Location: [facility];  Service: Endoscopy;  Laterality: N/A;   BILIARY STENT PLACEMENT N/A 09/08/2015   Procedure: BILIARY STENT PLACEMENT;  Surgeon: Daneil Dolin, MD;  Location: [facility];  Service: Endoscopy;  Laterality: N/A;   ERCP N/A 09/06/2015   Procedure: ENDOSCOPIC RETROGRADE CHOLANGIOPANCREATOGRAPHY (ERCP);  Surgeon: Daneil Dolin, MD;  Location: [facility];  Service: Endoscopy;  Laterality: N/A;   ERCP N/A 09/08/2015   Procedure: ENDOSCOPIC RETROGRADE CHOLANGIOPANCREATOGRAPHY (ERCP);  Surgeon: Daneil Dolin, MD;  Location: [facility];  Service: Endoscopy;  Laterality: N/A;  with bile duct brushings   HERNIA REPAIR     umbilical   IR PERC CHOLECYSTOSTOMY  08/02/2021   SHOULDER BONE SPUR Left    RESECTION   SPHINCTEROTOMY N/A 09/06/2015   Procedure: SPHINCTEROTOMY;  Surgeon: Daneil Dolin, MD;  Location: [facility];  Service:  Endoscopy;  Laterality: N/A;   TONSILLECTOMY     HPI:  Elijah [name] is a 81 y.o. male with medical history significant of history of chronic low back pain, diabetes mellitus type 2, hyperlipidemia, hypertension, Parkinson disease, obstructive sleep apnea, history of biliary obstruction and pancreatitis with ERCP in 2018, presents ED with chief complaint of dyspnea.  At time of presentation wife was at bedside.  She reported to the ED provider that patient has had 24 hours of shortness of breath and increased work of breathing.  He was recently seen in the ED 2 days ago for back pain, which was acute on chronic.  They had denied fevers at home, but wife did report the patient had left arm pain when she was trying to lift him up.  ED provider note reports that patient denied current abdominal pain.  Patient is not answering questions for me, but does have voluntary guarding with palpation of his abdomen.  Patient arrived in the ER satting in the low 90s on 4 L nasal cannula.  He was put on nonrebreather for work of breathing.  Sepsis protocol was started.  Patient was treated with vancomycin, cefepime, Flagyl, and given a 3 L bolus.  Lactic acid was significantly elevated 6.3, repeat 4.6.  Chest x-ray was negative for acute disease, CT abdomen pelvis did not given definitive explanation of his symptoms either.  Urine was borderline.  Patient did have an AKI.  Admission was requested for  further work-up of altered mental status, dyspnea, and most likely sepsis. Status post cholecystostomy tube placement on 08/02/2021 with bilious drainage present.  Still with leukocytosis.  Hyperbilirubinemia decreasing.  LFTs within normal limits. BSE requested last week and Pt now more alert.    Assessment / Plan / Recommendation  Clinical Impression  Pt continues to present with reduced alertness which negatively impacts safety with po intake. He reportedly requested something to drink earlier today, however he is  currently moaning in suspected pain (just had pain medication). Oral care completed and Pt with limited lip closure or lingual movement. Pt did respond x1 with "yeah" when asked if he wanted Coca-Cola. SLP presented ice chips, wet spoon, straw, and moistened toothette and Pt with limited response (minimal labial closure, minimal lingual movement). He appeared to swallow x1 with suspected limited hyolaryngeal excursion and immediate cough, but vocal quality remained clear. He is currently not alert engough to participate in po trials and should remain NPO until alertness improves. Continue to offer oral care and single ice chip from RN if alert and responsive. SLP will check back tomorrow. Above reviewed with RN and Pt's wife. SLP Visit Diagnosis: Dysphagia, unspecified (R13.10)    Aspiration Risk  Mild aspiration risk;Risk for inadequate nutrition/hydration    Diet Recommendation Ice chips PRN after oral care;NPO   Medication Administration: Via alternative means    Other  Recommendations Oral Care Recommendations: Oral care prior to ice chip/H20;Staff/trained caregiver to provide oral care    Recommendations for follow up therapy are one component of a multi-disciplinary discharge planning process, led by the attending physician.  Recommendations may be updated based on patient status, additional functional criteria and insurance authorization.  Follow up Recommendations Skilled nursing-short term rehab (<3 hours/day)      Assistance Recommended at Discharge Frequent or constant Supervision/Assistance  Functional Status Assessment Patient has had a recent decline in their functional status and demonstrates the ability to make significant improvements in function in a reasonable and predictable amount of time.  Frequency and Duration min 2x/week  1 week       Prognosis Prognosis for Safe Diet Advancement: Fair Barriers to Reach Goals: Behavior;Medication      Swallow Study   General  Date of Onset: 07/28/21 HPI: Elijah [name] is a 81 y.o. male with medical history significant of history of chronic low back pain, diabetes mellitus type 2, hyperlipidemia, hypertension, Parkinson disease, obstructive sleep apnea, history of biliary obstruction and pancreatitis with ERCP in 2018, presents ED with chief complaint of dyspnea.  At time of presentation wife was at bedside.  She reported to the ED provider that patient has had 24 hours of shortness of breath and increased work of breathing.  He was recently seen in the ED 2 days ago for back pain, which was acute on chronic.  They had denied fevers at home, but wife did report the patient had left arm pain when she was trying to lift him up.  ED provider note reports that patient denied current abdominal pain.  Patient is not answering questions for me, but does have voluntary guarding with palpation of his abdomen.  Patient arrived in the ER satting in the low 90s on 4 L nasal cannula.  He was put on nonrebreather for work of breathing.  Sepsis protocol was started.  Patient was treated with vancomycin, cefepime, Flagyl, and given a 3 L bolus.  Lactic acid was significantly elevated 6.3, repeat 4.6.  Chest x-ray was negative for  acute disease, CT abdomen pelvis did not given definitive explanation of his symptoms either.  Urine was borderline.  Patient did have an AKI.  Admission was requested for further work-up of altered mental status, dyspnea, and most likely sepsis. Status post cholecystostomy tube placement on 08/02/2021 with bilious drainage present.  Still with leukocytosis.  Hyperbilirubinemia decreasing.  LFTs within normal limits. BSE requested last week and Pt now more alert. Type of Study: Bedside Swallow Evaluation Previous Swallow Assessment: N/A Diet Prior to this Study: Regular;Thin liquids (nursting staff is holding the tray) Temperature Spikes Noted: No Respiratory Status: Room air History of Recent Intubation:  No Behavior/Cognition: Lethargic/Drowsy;Requires cueing Oral Cavity Assessment: Dry Oral Care Completed by SLP: Yes Oral Cavity - Dentition: Adequate natural dentition Vision: Impaired for self-feeding Self-Feeding Abilities: Total assist Patient Positioning: Upright in bed Baseline Vocal Quality: Normal Volitional Cough: Cognitively unable to elicit Volitional Swallow: Unable to elicit    Oral/Motor/Sensory Function Overall Oral Motor/Sensory Function:  (limited participation, but moves bilaterally)   Ice Chips Ice chips: Impaired Presentation: Spoon Oral Phase Impairments: Reduced labial seal;Reduced lingual movement/coordination;Poor awareness of bolus Oral Phase Functional Implications: Oral holding Pharyngeal Phase Impairments: Suspected delayed Swallow   Thin Liquid Thin Liquid: Impaired Presentation: Spoon Oral Phase Impairments: Reduced labial seal;Reduced lingual movement/coordination;Poor awareness of bolus Pharyngeal  Phase Impairments: Suspected delayed Swallow;Decreased hyoid-laryngeal movement;Cough - Immediate    Nectar Thick Nectar Thick Liquid: Not tested   Honey Thick Honey Thick Liquid: Not tested   Puree Puree: Not tested   Solid     Solid: Not tested     Thank you,  Genene Churn, [place]  Dalynn Jhaveri 08/03/2021,1:04 PM

## 2021-08-04 ENCOUNTER — Inpatient Hospital Stay (HOSPITAL_COMMUNITY): Payer: PPO

## 2021-08-04 DIAGNOSIS — Z515 Encounter for palliative care: Secondary | ICD-10-CM | POA: Diagnosis not present

## 2021-08-04 DIAGNOSIS — N179 Acute kidney failure, unspecified: Secondary | ICD-10-CM | POA: Diagnosis not present

## 2021-08-04 DIAGNOSIS — K810 Acute cholecystitis: Secondary | ICD-10-CM | POA: Diagnosis not present

## 2021-08-04 DIAGNOSIS — R7881 Bacteremia: Secondary | ICD-10-CM | POA: Diagnosis not present

## 2021-08-04 DIAGNOSIS — Z7189 Other specified counseling: Secondary | ICD-10-CM | POA: Diagnosis not present

## 2021-08-04 DIAGNOSIS — G9341 Metabolic encephalopathy: Secondary | ICD-10-CM | POA: Diagnosis not present

## 2021-08-04 DIAGNOSIS — J9601 Acute respiratory failure with hypoxia: Secondary | ICD-10-CM | POA: Diagnosis not present

## 2021-08-04 LAB — BASIC METABOLIC PANEL
Anion gap: 2 — ABNORMAL LOW (ref 5–15)
BUN: 54 mg/dL — ABNORMAL HIGH (ref 8–23)
BUN: 60 mg/dL — ABNORMAL HIGH (ref 8–23)
CO2: 22 mmol/L (ref 22–32)
CO2: 23 mmol/L (ref 22–32)
Calcium: 6.9 mg/dL — ABNORMAL LOW (ref 8.9–10.3)
Calcium: 7.8 mg/dL — ABNORMAL LOW (ref 8.9–10.3)
Chloride: 127 mmol/L — ABNORMAL HIGH (ref 98–111)
Chloride: >130 mmol/L (ref 98–111)
Creatinine, Ser: 1.25 mg/dL — ABNORMAL HIGH (ref 0.61–1.24)
Creatinine, Ser: 1.36 mg/dL — ABNORMAL HIGH (ref 0.61–1.24)
GFR, Estimated: 59 mL/min — ABNORMAL LOW (ref >60)
GFR, Estimated: 53 mL/min — ABNORMAL LOW (ref >60)
Glucose, Bld: 571 mg/dL (ref 70–99)
Glucose, Bld: 191 mg/dL — ABNORMAL HIGH (ref 70–99)
Potassium: 3.7 mmol/L (ref 3.5–5.1)
Potassium: 4.3 mmol/L (ref 3.5–5.1)
Sodium: 151 mmol/L — ABNORMAL HIGH (ref 135–145)
Sodium: 159 mmol/L — ABNORMAL HIGH (ref 135–145)

## 2021-08-04 LAB — GLUCOSE, CAPILLARY
Glucose-Capillary: 161 mg/dL — ABNORMAL HIGH (ref 70–99)
Glucose-Capillary: 180 mg/dL — ABNORMAL HIGH (ref 70–99)
Glucose-Capillary: 168 mg/dL — ABNORMAL HIGH (ref 70–99)

## 2021-08-04 LAB — CBC
HCT: 28.7 % — ABNORMAL LOW (ref 39.0–52.0)
Hemoglobin: 9 g/dL — ABNORMAL LOW (ref 13.0–17.0)
MCH: 31.7 pg (ref 26.0–34.0)
MCHC: 31.4 g/dL (ref 30.0–36.0)
MCV: 101.1 fL — ABNORMAL HIGH (ref 80.0–100.0)
Platelets: 191 10*3/uL (ref 150–400)
RBC: 2.84 MIL/uL — ABNORMAL LOW (ref 4.22–5.81)
RDW: 16.2 % — ABNORMAL HIGH (ref 11.5–15.5)
WBC: 14.9 10*3/uL — ABNORMAL HIGH (ref 4.0–10.5)
nRBC: 0 % (ref 0.0–0.2)

## 2021-08-04 LAB — ECHOCARDIOGRAM COMPLETE
AR max vel: 1.83 cm2
AV Area VTI: 2.18 cm2
AV Area mean vel: 1.73 cm2
AV Mean grad: 6 mmHg
AV Peak grad: 10.8 mmHg
Ao pk vel: 1.64 m/s
Area-P 1/2: 2.85 cm2
Height: 70 in
MV VTI: 3.35 cm2
S' Lateral: 2.7 cm
Weight: 3195.79 oz

## 2021-08-04 LAB — PROCALCITONIN: Procalcitonin: 2.99 ng/mL

## 2021-08-04 LAB — MAGNESIUM: Magnesium: 2.2 mg/dL (ref 1.7–2.4)

## 2021-08-04 MED ORDER — MINERAL OIL RE ENEM: 1.0000 | ENEMA | Freq: Once | RECTAL | Status: DC

## 2021-08-04 MED ORDER — BISACODYL 10 MG RE SUPP
10.0000 mg | Freq: Every day | RECTAL | Status: DC
Start: 2021-08-04 — End: 2021-08-11
  Administered 2021-08-04 – 2021-08-10 (×7): 10 mg via RECTAL
  Filled 2021-08-04 (×7): qty 1

## 2021-08-04 NOTE — Progress Notes (Addendum)
PROGRESS NOTE  DUTCH ING GLO:[id number] DOB: March 08, 1940 DOA: 07/28/2021 PCP: Celene Squibb, MD  Brief History:  As per H&P written by Dr.Zierle-Ghosh On 07/29/2021 KIETH HARTIS is a 81 y.o. male with medical history significant of history of chronic low back pain, diabetes mellitus type 2, hyperlipidemia, hypertension, Parkinson disease, obstructive sleep apnea, history of biliary obstruction and pancreatitis with ERCP in 2018, presents ED with chief complaint of dyspnea.  At time of presentation wife was at bedside.  She reported to the ED provider that patient has had 24 hours of shortness of breath and increased work of breathing.  He was recently seen in the ED 2 days ago for back pain, which was acute on chronic.  They had denied fevers at home, but wife did report the patient had left arm pain when she was trying to lift him up.  ED provider note reports that patient denied current abdominal pain.  Patient is not answering questions for me, but does have voluntary guarding with palpation of his abdomen.  Patient arrived in the ER satting in the low 90s on 4 L nasal cannula.  He was put on nonrebreather for work of breathing.  Sepsis protocol was started.  Patient was treated with vancomycin, cefepime, Flagyl, and given a 3 L bolus.  Lactic acid was significantly elevated 6.3, repeat 4.6.  Chest x-ray was negative for acute disease, CT abdomen pelvis did not given definitive explanation of his symptoms either.  Urine was borderline.  Patient did have an AKI.  Admission was requested for further work-up of altered mental status, dyspnea, and msepsis.   Assessment and Plan: * AKI (acute kidney injury) ([place]) -Creatinine normally in the 0.6-0.8 range at baseline; at time of admission 2.24 and peaked at 2.72; now down to 1.36. -Most likely in the setting of infection process and prerenal azotemia.  -Continue to maintain adequate hydration while following renal function  trend.  Hypernatremia - Na up to 158 -chenge to D5W>>increase to 150 cc/hr  Acute cholecystitis -as mentioned, patient found not a candidate for acute cholecystectomy process; IR to assist with cholecystostomy. -Continue supportive care and IV antibiotics.  Thrombocytopenia ([place]) -Most likely in the setting of sepsis. -Platelets count have trended down into the 70sK -improving with sepsis tx -Holding heparin products -SCDs for DVT prophylaxis. -Follow platelet count -No overt bleeding.  Bacteremia due to group B Streptococcus Repeat blood culture Echo Source - GB   Acute metabolic encephalopathy -Baseline>>pleasant confusion with hallucinations -Patient with underlying history of mood disorder/dementia and Parkinson. -Continue to be very obtunded/lethargic--but slowly improving -6/11 CT head--neg -ammonia = 41 -6/11 ABG 7.45/32/77/22 on 2L -Patient is not following commands, eating or drinking. -Palliative care consulted. -remains encephalopathic on 6/15  HLD (hyperlipidemia) -Patient mentation preventing him to be able to take oral medication -Holding statin for now.  Lactic acidosis -Secondary to sepsis -Initial lactic acid 6.3, repeat 4.6>> last one 1.1 -Continue to maintain adequate IV hydration  Acute respiratory failure with hypoxia ([place]) -Patient with good saturation currently on 1-2 L. -Patient had increasing work of breathing to the point of requiring transient use of BiPAP at time of admission. -Continue current IV antibiotics -now stable on RA  Severe sepsis ([facility]) -Heart rate 100, respiratory rate 30-44, lactic acidosis 6.3, AKI 2.24 -Possible intra-abdominal infection versus aspiration pneumonia; patient blood culture positive for strep B -Continue IV Rocephin -WBC's trending down after chole tube -Off BPAP and  with good saturation demonstrated on 4-5 L [place] supplementation with great saturation. -COVID and flu are negative    Gallstones -No  gallstones has been seen at this point -Continue IV antibiotics (Rocephin). -HIDA scan recommended by general surgery; results with concerns for cystic duct obstruction and cholecystitis.  -Not surgical candidate at this time -After discussing with family member decisions made to pursued cholecystostomy drain. -6/13 chole tube placed by IR  Total bilirubin, elevated -CMET demonstrating improvement in LFT;s and bilirubin level. -Patient does have history of biliary obstruction and ERCP back in 2018 -CT scan shows bilateral inguinal hernias, herniated bladder on the right, small nonobstructing stone, and gallstones without complication -MRCP has rule out retained CBD stones or any masses in his liver. -LFTs has continued trending down overall. -Continue IV antibiotics -After discussing with general surgery decision made to check HIDA scan; which demonstrated positive result for acute cholecystitis and cystic duct obstruction. -not a candidate for cholecystectomy/surgical intervention. -6/13--cholecystotomy tube  Essential hypertension -Continue holding ARB and  HCTZ due to AKI -Blood pressure has now stabilized; -continue  low-dose Lopressor IV. -BP and HR now controlled.   Diabetes mellitus without complication ([place]) -holding metformin -Patient currently n.p.o. -Will continue to monitor glucose with CBGs and if it starts trending up will add insulin coverage. -so far CBG's are stable. -check A1C  Parkinson disease ([place]) -pt on Sinemet when able to take by mouth, but poor mentation precludes po intake -Patient with underlying mood disorder and mild hallucinations/psychosis as per patient's wife reports>>>worse over past 6-12 months -Too lethargic to safely take oral meds currently. -Unable to place NG tube on 07/30/21 (very traumatic and never able to passed from his neck).    Family Communication:  spouse updated at bedside 6/15   Consultants:  palliative, general surgery    Code Status:  FULL    DVT Prophylaxis:  SCDs     Procedures: As Listed in Progress Note Above   Antibiotics: Ceftriaxone 6/11>> Cefepime 6/8>>6/11         Subjective: Patient opens eyes and says his name.  Remainder ROS not possible due to encephalopathy  Objective: Vitals:   08/04/21 0959 08/04/21 1000 08/04/21 1300 08/04/21 1400  BP: (!) 132/53 (!) 124/47 (!) 114/37 (!) 135/54  Pulse: 87 88 80 83  Resp: '19 18 13 14  '$ Temp:      TempSrc:      SpO2: 99% 98% 97% 96%  Weight:      Height:        Intake/Output Summary (Last 24 hours) at 08/04/2021 1822 Last data filed at 08/04/2021 1743 Gross per 24 hour  Intake 2826.96 ml  Output 2430 ml  Net 396.96 ml   Weight change:  Exam:  General:  Pt is alert, does not follow commands appropriately, not in acute distress HEENT: No icterus, No thrush, No neck mass, [place]/AT Cardiovascular: RRR, S1/S2, no rubs, no gallops Respiratory: bibasilar rales.  Now wheeze Abdomen: Soft/+BS, non tender, non distended, no guarding Extremities: trace LE edema, No lymphangitis, No petechiae, No rashes, no synovitis   Data Reviewed: I have personally reviewed following labs and imaging studies Basic Metabolic Panel: Recent Labs  Lab 07/29/21 0345 07/30/21 0444 07/31/21 0330 08/01/21 1434 08/03/21 0353 08/04/21 0406 08/04/21 0616  NA 133*   < > 140 151* 158* 151* 159*  K 4.3   < > 3.6 3.7 4.0 3.7 4.3  CL 103   < > 113* 125* >130* 127* >130*  CO2 20*   < >  18* 23 21* 22 23  GLUCOSE 137*   < > 140* 158* 182* 571* 191*  BUN 59*   < > 104* 85* 67* 54* 60*  CREATININE 2.30*   < > 2.55* 1.59* 1.37* 1.25* 1.36*  CALCIUM 7.9*   < > 7.4* 7.8* 8.0* 6.9* 7.8*  MG 1.6*  --   --   --   --  2.2  --    < > = values in this interval not displayed.   Liver Function Tests: Recent Labs  Lab 07/29/21 0345 07/30/21 0444 07/31/21 0330 08/01/21 1434 08/03/21 0353  AST 80* 68* 52* 32 23  ALT '10 10 12 28 22  '$ ALKPHOS 123 127* 137* 169* 131*   BILITOT 5.2* 5.9* 4.4* 3.1* 2.1*  PROT 5.2* 5.3* 5.2* 5.2* 5.7*  ALBUMIN 2.4* 2.4* 2.1* 2.0* 1.9*   Recent Labs  Lab 07/28/21 2231 07/29/21 0345 07/30/21 0444  LIPASE '13 13 18   '$ Recent Labs  Lab 07/31/21 1423  AMMONIA 41*   Coagulation Profile: Recent Labs  Lab 07/28/21 2231 07/29/21 0345  INR 1.2 1.4*   CBC: Recent Labs  Lab 07/28/21 2231 07/29/21 0345 07/30/21 0444 07/31/21 0330 08/01/21 0222 08/03/21 0353 08/04/21 0406  WBC 11.4* 11.2* 13.6* 12.8* 16.7* 19.6* 14.9*  NEUTROABS 9.2* 10.3* 12.2* 10.5*  --   --   --   HGB 12.3* 10.6* 10.7* 10.3* 11.7* 11.1* 9.0*  HCT 35.3* 30.9* 30.4* 29.6* 34.2* 34.7* 28.7*  MCV 91.9 93.1 91.6 91.9 92.2 98.3 101.1*  PLT 107* 90* 75* 72* 92* 177 191   Cardiac Enzymes: No results for input(s): "CKTOTAL", "CKMB", "CKMBINDEX", "TROPONINI" in the last 168 hours. BNP: Invalid input(s): "POCBNP" CBG: Recent Labs  Lab 08/03/21 1101 08/03/21 1723 08/03/21 2324 08/04/21 1108 08/04/21 1724  GLUCAP 147* 200* 194* 161* 180*   HbA1C: No results for input(s): "HGBA1C" in the last 72 hours. Urine analysis:    Component Value Date/Time   COLORURINE AMBER (A) 07/29/2021 1000   APPEARANCEUR CLOUDY (A) 07/29/2021 1000   LABSPEC 1.023 07/29/2021 1000   PHURINE 5.0 07/29/2021 1000   GLUCOSEU 50 (A) 07/29/2021 1000   HGBUR SMALL (A) 07/29/2021 1000   BILIRUBINUR NEGATIVE 07/29/2021 1000   KETONESUR 5 (A) 07/29/2021 1000   PROTEINUR 30 (A) 07/29/2021 1000   UROBILINOGEN 0.2 10/14/2009 1851   NITRITE NEGATIVE 07/29/2021 1000   LEUKOCYTESUR NEGATIVE 07/29/2021 1000   Sepsis Labs: '@LABRCNTIP'$ (procalcitonin:4,lacticidven:4) ) Recent Results (from the past 240 hour(s))  Culture, blood (Routine x 2)     Status: Abnormal   Collection Time: 07/28/21 10:33 PM   Specimen: BLOOD LEFT WRIST  Result Value Ref Range Status   Specimen Description   Final    BLOOD LEFT WRIST Performed at [facility], [address] [place], [place]  [address]    Special Requests   Final    BOTTLES DRAWN AEROBIC AND ANAEROBIC Blood Culture adequate volume Performed at [facility], [address] [place], [place] [address]    Culture  Setup Time   Final    GRAM POSITIVE COCCI IN BOTH AEROBIC AND ANAEROBIC BOTTLES Gram Stain Report Called to,Read Back By and Verified With: Kenedy @ 3244 ON [id number] BY HENDERSON L CRITICAL VALUE NOTED.  VALUE IS CONSISTENT WITH PREVIOUSLY REPORTED AND CALLED VALUE.    Culture (A)  Final    GROUP B STREP(S.AGALACTIAE)ISOLATED SUSCEPTIBILITIES PERFORMED ON PREVIOUS CULTURE WITHIN THE LAST 5 DAYS. Performed at [facility], [place] [address] [place], [place]  [address]    Report Status 07/31/2021 FINAL  Final  Culture, blood (Routine x 2)     Status: Abnormal   Collection Time: 07/28/21 10:33 PM   Specimen: BLOOD RIGHT HAND  Result Value Ref Range Status   Specimen Description   Final    BLOOD RIGHT HAND Performed at [facility], [address], [place], [place] [address]    Special Requests   Final    BOTTLES DRAWN AEROBIC AND ANAEROBIC Blood Culture results may not be optimal due to an excessive volume of blood received in culture bottles Performed at [facility], [address], [place], [place] [address]    Culture  Setup Time   Final    GRAM POSITIVE COCCI IN BOTH AEROBIC AND ANAEROBIC BOTTLES Gram Stain Report Called to,Read Back By and Verified With: CHILDRESS @ 0845 ON [id number] BY HENDERSON L CRITICAL RESULT CALLED TO, READ BACK BY AND VERIFIED WITH: Estelle June [id number] FCP Performed at [facility], [place] [address], [place], [place] [address]    Culture GROUP B STREP(S.AGALACTIAE)ISOLATED (A)  Final   Report Status 07/31/2021 FINAL  Final   Organism ID, Bacteria GROUP B STREP(S.AGALACTIAE)ISOLATED  Final      Susceptibility   Group b strep(s.agalactiae)isolated - MIC*    CLINDAMYCIN <=0.25 SENSITIVE Sensitive     AMPICILLIN <=0.25 SENSITIVE Sensitive     ERYTHROMYCIN  <=0.12 SENSITIVE Sensitive     VANCOMYCIN 0.5 SENSITIVE Sensitive     CEFTRIAXONE <=0.12 SENSITIVE Sensitive     LEVOFLOXACIN 1 SENSITIVE Sensitive     * GROUP B STREP(S.AGALACTIAE)ISOLATED  Blood Culture ID Panel (Reflexed)     Status: Abnormal   Collection Time: 07/28/21 10:33 PM  Result Value Ref Range Status   Enterococcus faecalis NOT DETECTED NOT DETECTED Final   Enterococcus Faecium NOT DETECTED NOT DETECTED Final   Listeria monocytogenes NOT DETECTED NOT DETECTED Final   Staphylococcus species NOT DETECTED NOT DETECTED Final   Staphylococcus aureus (BCID) NOT DETECTED NOT DETECTED Final   Staphylococcus epidermidis NOT DETECTED NOT DETECTED Final   Staphylococcus lugdunensis NOT DETECTED NOT DETECTED Final   Streptococcus species DETECTED (A) NOT DETECTED Final    Comment: CRITICAL RESULT CALLED TO, READ BACK BY AND VERIFIED WITH: PHARMD STEVEN H 1457 [id number] FCP    Streptococcus agalactiae DETECTED (A) NOT DETECTED Final    Comment: CRITICAL RESULT CALLED TO, READ BACK BY AND VERIFIED WITH: PHARMD STEVEN H 1457 [id number] FCP    Streptococcus pneumoniae NOT DETECTED NOT DETECTED Final   Streptococcus pyogenes NOT DETECTED NOT DETECTED Final   A.calcoaceticus-baumannii NOT DETECTED NOT DETECTED Final   Bacteroides fragilis NOT DETECTED NOT DETECTED Final   Enterobacterales NOT DETECTED NOT DETECTED Final   Enterobacter cloacae complex NOT DETECTED NOT DETECTED Final   Escherichia coli NOT DETECTED NOT DETECTED Final   Klebsiella aerogenes NOT DETECTED NOT DETECTED Final   Klebsiella oxytoca NOT DETECTED NOT DETECTED Final   Klebsiella pneumoniae NOT DETECTED NOT DETECTED Final   Proteus species NOT DETECTED NOT DETECTED Final   Salmonella species NOT DETECTED NOT DETECTED Final   Serratia marcescens NOT DETECTED NOT DETECTED Final   Haemophilus influenzae NOT DETECTED NOT DETECTED Final   Neisseria meningitidis NOT DETECTED NOT DETECTED Final   Pseudomonas aeruginosa NOT  DETECTED NOT DETECTED Final   Stenotrophomonas maltophilia NOT DETECTED NOT DETECTED Final   Candida albicans NOT DETECTED NOT DETECTED Final   Candida auris NOT DETECTED NOT DETECTED Final   Candida glabrata  NOT DETECTED NOT DETECTED Final   Candida krusei NOT DETECTED NOT DETECTED Final   Candida parapsilosis NOT DETECTED NOT DETECTED Final   Candida tropicalis NOT DETECTED NOT DETECTED Final   Cryptococcus neoformans/gattii NOT DETECTED NOT DETECTED Final    Comment: Performed at [facility], [place] [address], [place], [place] [address]  Resp Panel by RT-PCR (Flu A&B, Covid) Anterior Nasal Swab     Status: None   Collection Time: 07/28/21 10:51 PM   Specimen: Anterior Nasal Swab  Result Value Ref Range Status   SARS Coronavirus 2 by RT PCR NEGATIVE NEGATIVE Final    Comment: (NOTE) SARS-CoV-2 target nucleic acids are NOT DETECTED.  The SARS-CoV-2 RNA is generally detectable in upper respiratory specimens during the acute phase of infection. The lowest concentration of SARS-CoV-2 viral copies this assay can detect is 138 copies/mL. A negative result does not preclude SARS-Cov-2 infection and should not be used as the sole basis for treatment or other patient management decisions. A negative result may occur with  improper specimen collection/handling, submission of specimen other than nasopharyngeal swab, presence of viral mutation(s) within the areas targeted by this assay, and inadequate number of viral copies(<138 copies/mL). A negative result must be combined with clinical observations, patient history, and epidemiological information. The expected result is Negative.  Fact Sheet for Patients:  EntrepreneurPulse.com.au  Fact Sheet for Healthcare Providers:  IncredibleEmployment.be  This test is no t yet approved or cleared by the [place] FDA and  has been authorized for detection and/or diagnosis of SARS-CoV-2 by FDA under an  Emergency Use Authorization (EUA). This EUA will remain  in effect (meaning this test can be used) for the duration of the COVID-19 declaration under Section 564(b)(1) of the Act, 21 U.S.C.section 360bbb-3(b)(1), unless the authorization is terminated  or revoked sooner.       Influenza A by PCR NEGATIVE NEGATIVE Final   Influenza B by PCR NEGATIVE NEGATIVE Final    Comment: (NOTE) The Xpert Xpress SARS-CoV-2/FLU/RSV plus assay is intended as an aid in the diagnosis of influenza from Nasopharyngeal swab specimens and should not be used as a sole basis for treatment. Nasal washings and aspirates are unacceptable for Xpert Xpress SARS-CoV-2/FLU/RSV testing.  Fact Sheet for Patients: EntrepreneurPulse.com.au  Fact Sheet for Healthcare Providers: IncredibleEmployment.be  This test is not yet approved or cleared by the [place] FDA and has been authorized for detection and/or diagnosis of SARS-CoV-2 by FDA under an Emergency Use Authorization (EUA). This EUA will remain in effect (meaning this test can be used) for the duration of the COVID-19 declaration under Section 564(b)(1) of the Act, 21 U.S.C. section 360bbb-3(b)(1), unless the authorization is terminated or revoked.  Performed at [facility], [address], [place], [place] [address]   Urine Culture     Status: None   Collection Time: 07/29/21  2:19 AM   Specimen: Urine, Clean Catch  Result Value Ref Range Status   Specimen Description   Final    URINE, CLEAN CATCH Performed at [facility], [address], [place], [place] [address]    Special Requests   Final    NONE Performed at [facility], [address] [place], [place] [address]    Culture   Final    NO GROWTH Performed at [facility], [place] [address], [place],  [address]    Report Status 07/31/2021 FINAL  Final  MRSA Next Gen by PCR, Nasal     Status: None  Collection Time: 07/29/21 10:27 AM    Specimen: Nasal Mucosa; Nasal Swab  Result Value Ref Range Status   MRSA by PCR Next Gen NOT DETECTED NOT DETECTED Final    Comment: (NOTE) The GeneXpert MRSA Assay (FDA approved for NASAL specimens only), is one component of a comprehensive MRSA colonization surveillance program. It is not intended to diagnose MRSA infection nor to guide or monitor treatment for MRSA infections. Test performance is not FDA approved in patients less than 65 years old. Performed at [facility], [address], [place], [place] [address]   Aerobic/Anaerobic Culture w Gram Stain (surgical/deep wound)     Status: Abnormal (Preliminary result)   Collection Time: 08/02/21  2:25 PM   Specimen: Gallbladder; Bile  Result Value Ref Range Status   Specimen Description   Final    GALL BLADDER Performed at [facility], [address] [place], [place] [address]    Special Requests   Final    NONE Performed at [facility], [address], [place], [place] [address]    Gram Stain   Final    NO WBC SEEN FEW GRAM POSITIVE COCCI IN PAIRS FEW GRAM NEGATIVE RODS MODERATE BRANCHING, BEADING  GRAM POSITIVE RODS Performed at [facility], [place] [address] [place], [place] [address]    Culture (A)  Final    MULTIPLE ORGANISMS PRESENT, NONE PREDOMINANT NO GROUP A STREP (S.PYOGENES) ISOLATED NO STAPHYLOCOCCUS AUREUS ISOLATED NO ANAEROBES ISOLATED; CULTURE IN PROGRESS FOR 5 DAYS    Report Status PENDING  Incomplete  Culture, blood (Routine X 2) w Reflex to ID Panel     Status: None (Preliminary result)   Collection Time: 08/04/21  4:06 AM   Specimen: Right Antecubital; Blood  Result Value Ref Range Status   Specimen Description RIGHT ANTECUBITAL  Final   Special Requests   Final    BOTTLES DRAWN AEROBIC AND ANAEROBIC Blood Culture adequate volume   Culture   Final    NO GROWTH < 12 HOURS Performed at [facility], [address], [place], [place] [address]    Report Status PENDING  Incomplete  Culture,  blood (Routine X 2) w Reflex to ID Panel     Status: None (Preliminary result)   Collection Time: 08/04/21  6:15 AM   Specimen: BLOOD LEFT HAND  Result Value Ref Range Status   Specimen Description BLOOD LEFT HAND  Final   Special Requests   Final    BOTTLES DRAWN AEROBIC AND ANAEROBIC Blood Culture adequate volume   Culture   Final    NO GROWTH <12 HOURS Performed at [facility], [address] [place],  [address]    Report Status PENDING  Incomplete     Scheduled Meds:  aspirin  300 mg Rectal Daily   bisacodyl  10 mg Rectal Daily   budesonide (PULMICORT) nebulizer solution  0.5 mg Nebulization BID   Carbidopa-Levodopa ER  1 tablet Oral Q supper   Carbidopa-Levodopa ER  2 tablet Oral TID   Chlorhexidine Gluconate Cloth  6 each Topical Q0600   metoprolol tartrate  2.5 mg Intravenous Q12H   sodium chloride flush  10-40 mL Intracatheter Q12H   sodium chloride flush  5-10 mL Intracatheter Q8H   Continuous Infusions:  cefTRIAXone (ROCEPHIN)  IV Stopped (08/03/21 2012)   dextrose 150 mL/hr at 08/04/21 1819    Procedures/Studies: ECHOCARDIOGRAM COMPLETE  Result Date: 08/04/2021    ECHOCARDIOGRAM REPORT   Patient Name:   Carmine L Martensen Date of  Exam: 08/04/2021 Medical Rec #:  [id number]       Height:       70.0 in Accession #:    [id number]      Weight:       199.7 lb Date of Birth:  04-24-1940      BSA:          2.086 m Patient Age:    72 years        BP:           139/46 mmHg Patient Gender: M               HR:           81 bpm. Exam Location:  [facility] Procedure: 2D Echo, Cardiac Doppler and Color Doppler Indications:    Bacteremia  History:        Patient has no prior history of Echocardiogram examinations.                 Signs/Symptoms:Bacteremia; Risk Factors:Hypertension, Diabetes                 and Dyslipidemia. Technically difficult study. Patient with                 Parkinson's disease is unable to follow instructions.  Sonographer:    Wenda Low Referring Phys:  [phone] Ottis Vacha  Sonographer Comments: Technically difficult study due to poor echo windows. Image acquisition challenging due to respiratory motion. IMPRESSIONS  1. Left ventricular ejection fraction, by estimation, is 70 to 75%. The left ventricle has hyperdynamic function. The left ventricle has no regional wall motion abnormalities. Left ventricular diastolic parameters are consistent with Grade I diastolic dysfunction (impaired relaxation).  2. Right ventricular systolic function is normal. The right ventricular size is normal. There is normal pulmonary artery systolic pressure. The estimated right ventricular systolic pressure is 78.2 mmHg.  3. The mitral valve is grossly normal. Trivial mitral valve regurgitation.  4. The aortic valve is calcified. Aortic valve regurgitation is not visualized. Aortic valve sclerosis/calcification is present, without any evidence of aortic stenosis. Aortic valve mean gradient measures 6.0 mmHg.  5. The inferior vena cava is normal in size with greater than 50% respiratory variability, suggesting right atrial pressure of 3 mmHg. Comparison(s): No prior Echocardiogram. Conclusion(s)/Recommendation(s): No evidence of valvular vegetations on this transthoracic echocardiogram. Consider a transesophageal echocardiogram to exclude infective endocarditis if clinically indicated. FINDINGS  Left Ventricle: Left ventricular ejection fraction, by estimation, is 70 to 75%. The left ventricle has hyperdynamic function. The left ventricle has no regional wall motion abnormalities. The left ventricular internal cavity size was normal in size. There is no left ventricular hypertrophy. Left ventricular diastolic parameters are consistent with Grade I diastolic dysfunction (impaired relaxation). Indeterminate filling pressures. Right Ventricle: The right ventricular size is normal. No increase in right ventricular wall thickness. Right ventricular systolic function is normal. There is normal  pulmonary artery systolic pressure. The tricuspid regurgitant velocity is 2.66 m/s, and  with an assumed right atrial pressure of 3 mmHg, the estimated right ventricular systolic pressure is 42.3 mmHg. Left Atrium: Left atrial size was normal in size. Right Atrium: Right atrial size was normal in size. Pericardium: There is no evidence of pericardial effusion. Mitral Valve: The mitral valve is grossly normal. Trivial mitral valve regurgitation. MV peak gradient, 3.1 mmHg. The mean mitral valve gradient is 1.0 mmHg. Tricuspid Valve: The tricuspid valve is grossly normal. Tricuspid valve regurgitation is mild. Aortic Valve: The aortic  valve is calcified. Aortic valve regurgitation is not visualized. Aortic valve sclerosis/calcification is present, without any evidence of aortic stenosis. Aortic valve mean gradient measures 6.0 mmHg. Aortic valve peak gradient measures 10.8 mmHg. Aortic valve area, by VTI measures 2.18 cm. Pulmonic Valve: The pulmonic valve was normal in structure. Pulmonic valve regurgitation is not visualized. Aorta: The aortic root and ascending aorta are structurally normal, with no evidence of dilitation. Venous: The inferior vena cava is normal in size with greater than 50% respiratory variability, suggesting right atrial pressure of 3 mmHg. IAS/Shunts: No atrial level shunt detected by color flow Doppler.  LEFT VENTRICLE PLAX 2D LVIDd:         4.90 cm   Diastology LVIDs:         2.70 cm   LV e' medial:    6.42 cm/s LV PW:         1.10 cm   LV E/e' medial:  10.7 LV IVS:        1.00 cm   LV e' lateral:   12.20 cm/s LVOT diam:     1.90 cm   LV E/e' lateral: 5.7 LV SV:         71 LV SV Index:   34 LVOT Area:     2.84 cm  LEFT ATRIUM           Index LA diam:      3.50 cm 1.68 cm/m LA Vol (A4C): 22.8 ml 10.93 ml/m  AORTIC VALVE AV Area (Vmax):    1.83 cm AV Area (Vmean):   1.73 cm AV Area (VTI):     2.18 cm AV Vmax:           164.00 cm/s AV Vmean:          119.000 cm/s AV VTI:            0.325  m AV Peak Grad:      10.8 mmHg AV Mean Grad:      6.0 mmHg LVOT Vmax:         106.00 cm/s LVOT Vmean:        72.700 cm/s LVOT VTI:          0.250 m LVOT/AV VTI ratio: 0.77  AORTA Ao Root diam: 3.40 cm MITRAL VALVE               TRICUSPID VALVE MV Area (PHT): 2.85 cm    TR Peak grad:   28.3 mmHg MV Area VTI:   3.35 cm    TR Vmax:        266.00 cm/s MV Peak grad:  3.1 mmHg MV Mean grad:  1.0 mmHg    SHUNTS MV Vmax:       0.88 m/s    Systemic VTI:  0.25 m MV Vmean:      48.2 cm/s   Systemic Diam: 1.90 cm MV Decel Time: 266 msec MV E velocity: 69.00 cm/s MV A velocity: 56.10 cm/s MV E/A ratio:  1.23 Lyman Bishop MD Electronically signed by Lyman Bishop MD Signature Date/Time: 08/04/2021/4:53:01 PM    Final    [place] EKG SITE RITE  Result Date: 08/03/2021 If Site Rite image not attached, placement could not be confirmed due to current cardiac rhythm.  IR Perc Cholecystostomy  Result Date: 08/02/2021 INDICATION: 81 year old male referred for percutaneous cholecystostomy EXAM: CHOLECYSTOSTOMY MEDICATIONS: 500 mg levofloxacin; The antibiotic was administered within an appropriate time frame prior to the initiation of the procedure. ANESTHESIA/SEDATION: Moderate (conscious) sedation was  employed during this procedure. A total of Versed 0.5 mg and Fentanyl 25 mcg was administered intravenously. Moderate Sedation Time: 11 minutes. The patient's level of consciousness and vital signs were monitored continuously by radiology nursing throughout the procedure under my direct supervision. FLUOROSCOPY TIME:  Fluoroscopy Time: 0 minutes 24 seconds (3 mGy). COMPLICATIONS: None PROCEDURE: Informed written consent was obtained from the patient and the patient's family after a thorough discussion of the procedural risks, benefits and alternatives. All questions were addressed. Maximal Sterile Barrier Technique was utilized including caps, mask, sterile gowns, sterile gloves, sterile drape, hand hygiene and skin antiseptic. A  timeout was performed prior to the initiation of the procedure. Ultrasound survey of the right upper quadrant was performed for planning purposes. Once the patient is prepped and draped in the usual sterile fashion, the skin and subcutaneous tissues overlying the gallbladder were generously infiltrated 1% lidocaine for local anesthesia. A coaxial needle was advanced under ultrasound guidance through the skin subcutaneous tissues and a small segment of liver into the gallbladder lumen. With removal of the stylet, spontaneous dark bile drainage occurred. Using modified Seldinger technique, a 10 French drain was placed into the gallbladder fossa, with aspiration of the sample for the lab. Contrast injection confirmed position of the tube within the gallbladder lumen. Drainage catheter was attached to gravity drain with a suture retention placed. Patient tolerated the procedure well and remained hemodynamically stable throughout. No complications were encountered and no significant blood loss encountered. IMPRESSION: Status post percutaneous cholecystostomy Signed, Dulcy Fanny. Nadene Rubins, RPVI [facility] Electronically Signed   By: Corrie Mckusick D.O.   On: 08/02/2021 14:33   NM Hepatobiliary Liver Func  Result Date: 08/01/2021 CLINICAL DATA:  Concern for cholecystitis. EXAM: NUCLEAR MEDICINE HEPATOBILIARY IMAGING TECHNIQUE: Sequential images of the abdomen were obtained out to 60 minutes following intravenous administration of radiopharmaceutical. 3 mm IV morphine administered to augment filling of the gallbladder. RADIOPHARMACEUTICALS:  5.5 mCi Tc-49m Choletec IV COMPARISON:  MRI 07/29/2021 FINDINGS: Uniform accumulation radiotracer within the liver. Counts are evident within the small bowel by 20 minutes. The gallbladder fails to fill at 60 minutes. IV morphine was administered to augment filling of the gallbladder. Post morphine injection, the  gallbladder fails to fill. IMPRESSION: 1. Non filling of the gallbladder is consistent with obstruction of cystic duct / acute cholecystitis. 2. Patent common bile duct. These results will be called to the ordering clinician or representative by the Radiologist Assistant, and communication documented in the PACS or [employer] Electronically Signed   By: SSuzy BouchardM.D.   On: 08/01/2021 11:52   CT HEAD WO CONTRAST (5MM)  Result Date: 07/31/2021 CLINICAL DATA:  Altered mental status EXAM: CT HEAD WITHOUT CONTRAST TECHNIQUE: Contiguous axial images were obtained from the base of the skull through the vertex without intravenous contrast. RADIATION DOSE REDUCTION: This exam was performed according to the departmental dose-optimization program which includes automated exposure control, adjustment of the mA and/or kV according to patient size and/or use of iterative reconstruction technique. COMPARISON:  03/19/2021 FINDINGS: Brain: No acute intracranial findings are seen. There are no signs of bleeding within the cranium. Cortical sulci are prominent more so in the left cerebral hemisphere. There is no focal effacement of cortical sulci. Vascular: Unremarkable. Skull: Unremarkable. Sinuses/Orbits: There is mucosal thickening in the ethmoid sinus. Other: None. IMPRESSION: No acute intracranial findings are seen in noncontrast CT brain. Atrophy. Electronically Signed   By: PElmer Picker  M.D.   On: 07/31/2021 14:40   DG Chest Port 1 View  Addendum Date: 07/30/2021   ADDENDUM REPORT: 07/30/2021 14:27 ADDENDUM: Voice recognition error in the impression. For the first sentence, the NG tube should be described as malpositioned, not well positioned. NG tube curls in the neck and does not extend into the thorax and will need to be replaced/repositioned. Electronically Signed   By: Lajean Manes M.D.   On: 07/30/2021 14:27   Result Date: 07/30/2021 CLINICAL DATA:  NG tube placement. EXAM: PORTABLE CHEST  1 VIEW COMPARISON:  07/29/2021. FINDINGS: NG tube is partly visualized, curled in the neck, presumably in the oropharynx. Cardiac silhouette normal in size. No mediastinal or hilar masses. Lungs demonstrate prominent bronchovascular markings, otherwise clear. No pleural effusion or pneumothorax. IMPRESSION: 1. NG tube well positioned, curled in the neck, presumably in the oropharynx. 2. No acute cardiopulmonary disease. Electronically Signed: By: Lajean Manes M.D. On: 07/30/2021 13:46   DG Abd 1 View  Result Date: 07/30/2021 CLINICAL DATA:  Gastric distension EXAM: ABDOMEN - 1 VIEW COMPARISON:  None Available. FINDINGS: The bowel gas pattern is nonobstructive. Mild gaseous distension of stomach and bowel. There are no abnormal calcifications overlying the kidneys. Pelvic phleboliths. Chronic right anterolateral tenth eleventh rib injuries. Degenerative changes of the spine with levoconvex lumbar curvature. IMPRESSION: No evidence of bowel obstruction. Electronically Signed   By: Maurine Simmering M.D.   On: 07/30/2021 11:33   MR ABDOMEN MRCP WO CONTRAST  Result Date: 07/29/2021 CLINICAL DATA:  Upper abdominal pain. Dilated common bile duct on ultrasound. EXAM: MRI ABDOMEN WITHOUT CONTRAST  (INCLUDING MRCP) TECHNIQUE: Multiplanar multisequence MR imaging of the abdomen was performed. Heavily T2-weighted images of the biliary and pancreatic ducts were obtained, and three-dimensional MRCP images were rendered by post processing. COMPARISON:  Ultrasound 07/29/2021 FINDINGS: Motion degradation exam. ICU patient. ICU patients have difficulty following breath holding commands. Lower chest:  Small bilateral pleural effusions Hepatobiliary: No intrahepatic biliary duct dilatation. Common bile duct normal caliber. The no gallstones identified. Small amount sludge within the gallbladder. No gallbladder distension or inflammation. Pancreas: Normal pancreatic parenchymal intensity. No ductal dilatation or inflammation. Spleen:  Normal spleen. Adrenals/urinary tract: Adrenal glands and kidneys are normal. Stomach/Bowel: Stomach and limited of the small bowel is unremarkable Vascular/Lymphatic: Abdominal aortic normal caliber. No retroperitoneal periportal lymphadenopathy. Musculoskeletal: No aggressive osseous lesion IMPRESSION: 1. Normal common bile duct. No intrahepatic biliary duct dilatation. 2. Small amount gallbladder sludge.  No evidence of cholecystitis. 3. Normal pancreas. 4. Bibasilar atelectasis. 5. Imaging degraded by respiratory motion. Electronically Signed   By: Suzy Bouchard M.D.   On: 07/29/2021 19:59   MR 3D Recon At Scanner  Result Date: 07/29/2021 CLINICAL DATA:  Upper abdominal pain. Dilated common bile duct on ultrasound. EXAM: MRI ABDOMEN WITHOUT CONTRAST  (INCLUDING MRCP) TECHNIQUE: Multiplanar multisequence MR imaging of the abdomen was performed. Heavily T2-weighted images of the biliary and pancreatic ducts were obtained, and three-dimensional MRCP images were rendered by post processing. COMPARISON:  Ultrasound 07/29/2021 FINDINGS: Motion degradation exam. ICU patient. ICU patients have difficulty following breath holding commands. Lower chest:  Small bilateral pleural effusions Hepatobiliary: No intrahepatic biliary duct dilatation. Common bile duct normal caliber. The no gallstones identified. Small amount sludge within the gallbladder. No gallbladder distension or inflammation. Pancreas: Normal pancreatic parenchymal intensity. No ductal dilatation or inflammation. Spleen: Normal spleen. Adrenals/urinary tract: Adrenal glands and kidneys are normal. Stomach/Bowel: Stomach and limited of the small bowel is unremarkable Vascular/Lymphatic: Abdominal aortic normal caliber.  No retroperitoneal periportal lymphadenopathy. Musculoskeletal: No aggressive osseous lesion IMPRESSION: 1. Normal common bile duct. No intrahepatic biliary duct dilatation. 2. Small amount gallbladder sludge.  No evidence of  cholecystitis. 3. Normal pancreas. 4. Bibasilar atelectasis. 5. Imaging degraded by respiratory motion. Electronically Signed   By: Suzy Bouchard M.D.   On: 07/29/2021 19:59   DG CHEST PORT 1 VIEW  Result Date: 07/29/2021 CLINICAL DATA:  Shortness of breath.  Chest pain.  Diaphoresis. EXAM: PORTABLE CHEST 1 VIEW COMPARISON:  07/29/2021 FINDINGS: Low lung volumes are present, causing crowding of the pulmonary vasculature. There is likely mild subsegmental atelectasis along both hemidiaphragms. Mild cardiomegaly. Prominence of the right upper mediastinal margin, cannot exclude adenopathy or nodule. IMPRESSION: 1. Low lung volumes and suspected mild atelectasis at the lung bases. 2. Somewhat nodular prominence the right upper mediastinum, cannot exclude adenopathy or nodule although this might possibly be from tortuous vasculature. Chest CT could be utilized for further characterization. 3. Mild cardiomegaly. Electronically Signed   By: Van Clines M.D.   On: 07/29/2021 10:05   US Abdomen Limited RUQ (LIVER/GB)  Result Date: 07/29/2021 CLINICAL DATA:  Abnormal LFTs EXAM: ULTRASOUND ABDOMEN LIMITED RIGHT UPPER QUADRANT COMPARISON:  CT abdomen and pelvis 07/28/2021 FINDINGS: Gallbladder: Moderately distended and contains echogenic calculi and sludge dependently. 5 mm echogenic likely polyp at the fundus. No significant wall thickening or pericholecystic fluid identified. Common bile duct: Diameter: 8 mm, upper normal for the patient's age. Liver: No focal lesion identified. Within normal limits in parenchymal echogenicity. Portal vein is patent on color Doppler imaging with normal direction of blood flow towards the liver. Other: None. IMPRESSION: 1. Cholelithiasis and sludge. 2. 5 mm likely polyp in the gallbladder. 3. Upper normal caliber of the common bile duct and moderate distention of the gallbladder. Correlate clinically and consider MRCP if indicated. Electronically Signed   By: Ofilia Neas  M.D.   On: 07/29/2021 09:56   DG CHEST PORT 1 VIEW  Result Date: 07/29/2021 CLINICAL DATA:  Wheezing EXAM: PORTABLE CHEST 1 VIEW COMPARISON:  07/28/2021 FINDINGS: Limited low volume chest with generalized interstitial coarsening/crowding. No Kerley lines or pleural effusion. Normal heart size and mediastinal contours. Extensive artifact from EKG leads IMPRESSION: Limited low volume chest that is stable from yesterday Electronically Signed   By: Jorje Guild M.D.   On: 07/29/2021 06:48   DG Wrist Complete Left  Result Date: 07/29/2021 CLINICAL DATA:  Wrist pain EXAM: LEFT WRIST - COMPLETE 3+ VIEW COMPARISON:  None Available. FINDINGS: No fracture or malalignment. Vascular calcifications. Advanced joint space narrowing of the radiocarpal joint. No erosions. IMPRESSION: 1. 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 2. No acute osseous abnormality Electronically Signed   By: Donavan Foil M.D.   On: 07/29/2021 00:09   CT ABDOMEN PELVIS WO CONTRAST  Result Date: 07/28/2021 CLINICAL DATA:  Abdominal pain EXAM: CT ABDOMEN AND PELVIS WITHOUT CONTRAST TECHNIQUE: Multidetector CT imaging of the abdomen and pelvis was performed following the standard protocol without IV contrast. RADIATION DOSE REDUCTION: This exam was performed according to the departmental dose-optimization program which includes automated exposure control, adjustment of the mA and/or kV according to patient size and/or use of iterative reconstruction technique. COMPARISON:  07/26/2021 FINDINGS: Lower chest: Small pleural effusions are noted bilaterally slightly increased when compared with the prior exam. Mild bibasilar atelectasis is seen. Hepatobiliary: Liver is within normal limits. Dependent gallstones are noted within the gallbladder which is well distended. Pneumobilia is again identified consistent with the given clinical history of  sphincterotomy. Pancreas: Unremarkable. No pancreatic ductal dilatation or surrounding inflammatory changes.  Spleen: Normal in size without focal abnormality. Adrenals/Urinary Tract: Adrenal glands are within normal limits bilaterally. Kidneys show no tiny nonobstructing renal stone on the right. No definitive stones are noted on the left. Hypodense lesion is noted within the left kidney measuring approximately 1.5 cm consistent with small cysts stable in appearance from the prior exam. No obstructive changes are seen. The bladder is well distended with mildly opacified urine. A portion of the bladder extends into a large right sided inguinal hernia. This is stable in appearance from the prior exam. Stomach/Bowel: Scattered fecal material is noted throughout the colon. Mild diverticular change is seen. The appendix has been surgically removed consistent with the given clinical history. Small bowel and stomach are unremarkable. Vascular/Lymphatic: Aortic atherosclerosis. No enlarged abdominal or pelvic lymph nodes. Reproductive: Prostate is unremarkable. Other: No ascites is noted. Bilateral fat containing inguinal hernias are seen. Persistent herniated bladder into the right hernia is noted. Musculoskeletal: Degenerative changes of lumbar spine are seen. IMPRESSION: Small nonobstructing right renal stone. Bilateral inguinal hernias with herniated bladder on the right stable from the prior study. Multiple small gallstones without complicating factors. No other focal abnormality is noted. Electronically Signed   By: Inez Catalina M.D.   On: 07/28/2021 23:58   DG Chest Port 1 View  Result Date: 07/28/2021 CLINICAL DATA:  Respiratory distress EXAM: PORTABLE CHEST 1 VIEW COMPARISON:  07/26/2021 FINDINGS: Hypoventilatory changes. No consolidation or effusion. Stable cardiomediastinal silhouette. No pneumothorax IMPRESSION: No active disease.  Low lung volumes. Electronically Signed   By: Donavan Foil M.D.   On: 07/28/2021 22:42   DG Chest Port 1 View  Result Date: 07/26/2021 CLINICAL DATA:  leukocytosis, back pain, poss  PNA? EXAM: PORTABLE CHEST 1 VIEW COMPARISON:  Radiograph 08/01/2018 FINDINGS: Unchanged cardiomediastinal silhouette. Low lung volumes. No focal airspace disease. No pleural effusion. No pneumothorax. 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 IMPRESSION: Low lung volumes.  No focal airspace disease. High-riding humeral heads bilaterally can be seen in distal rotator cuff tendinopathy. Electronically Signed   By: Maurine Simmering M.D.   On: 07/26/2021 14:09   CT Abdomen Pelvis W Contrast  Result Date: 07/26/2021 CLINICAL DATA:  Abdominal pain, acute, nonlocalized dementia, prior abd surgery, back? pain, diaphoresis, concern for aorta vs. nephrolith EXAM: CT ABDOMEN AND PELVIS WITH CONTRAST TECHNIQUE: Multidetector CT imaging of the abdomen and pelvis was performed using the standard protocol following bolus administration of intravenous contrast. RADIATION DOSE REDUCTION: This exam was performed according to the departmental dose-optimization program which includes automated exposure control, adjustment of the mA and/or kV according to patient size and/or use of iterative reconstruction technique. CONTRAST:  73m OMNIPAQUE IOHEXOL 300 MG/ML  SOLN COMPARISON:  CT 12/23/2015 FINDINGS: Lower chest: No acute abnormality. Hepatobiliary: No focal liver abnormality is seen. Mild gallbladder distension with layering sludge or tiny stones. There is pneumobilia consistent with history of prior ERCP and sphincterotomy. Pancreas: Unremarkable. No pancreatic ductal dilatation or surrounding inflammatory changes. Spleen: Normal in size without focal abnormality. Adrenals/Urinary Tract: Adrenal glands are unremarkable. No hydronephrosis or nephrolithiasis. There are nonobstructive bilateral renal stones. Unchanged bilateral renal cysts. There is a right inguinal hernia containing herniated bladder. Stomach/Bowel: The stomach is within normal limits. There is no evidence of bowel obstruction.Prior  appendectomy. Scattered colonic diverticula. No diverticulitis. Moderate rectal stool burden. Vascular/Lymphatic: Aortoiliac atherosclerosis. No AAA. No lymphadenopathy. Reproductive: Unremarkable. Other: Right inguinal hernia containing hernia bladder. Fat containing left inguinal  hernia. Unchanged anterior abdominal wall scarring. Musculoskeletal: Levoconvex upper lumbar and dextroconvex lower lumbar curvatures. 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 There is severe multilevel facet arthropathy. Mild bilateral hip osteoarthritis. IMPRESSION: Nonobstructive 3 mm renal stones bilaterally. No hydronephrosis or ureterolithiasis. Right inguinal hernia containing distended herniated bladder. Mild gallbladder is tension with layering sludge or small stones. No other findings to suggest cholecystitis by CT. 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 Aortoiliac atherosclerosis.  No AAA. Moderate rectal stool burden. Electronically Signed   By: Maurine Simmering M.D.   On: 07/26/2021 10:10    Orson Eva, DO  [facility]  If 7PM-7AM, please contact night-coverage [email or web address] Password [facility] 08/04/2021, 6:22 PM   LOS: 6 days

## 2021-08-04 NOTE — Progress Notes (Signed)
Cholecystostomy tube is patent and draining bilious fluid.  475 cc was noted. We will sign off.  Please call me if I can be of further assistance.

## 2021-08-04 NOTE — Progress Notes (Signed)
Stool ball noted when giving Elijah [name] aspirin suppository this morning.  Dr. Carles Collet notified for PRN/orders for medication to assist with passage.   Mr. Rakestraw remains lethargic and non-interactive. Despite patient's wife request that patient be given ice and water, patient is not appropriate for oral intake at this time.

## 2021-08-04 NOTE — Progress Notes (Signed)
Soap suds enema given, large stool ball noted. Hard, brown stool difficult to pass, manual disimpaction performed to assist stool passage. Dr. Carles Collet notified, suppository ordered by MD.

## 2021-08-04 NOTE — Inpatient Diabetes Management (Signed)
Inpatient Diabetes Program Recommendations  AACE/ADA: New Consensus Statement on Inpatient Glycemic Control   Target Ranges:  Prepandial:   less than 140 mg/dL      Peak postprandial:   less than 180 mg/dL (1-2 hours)      Critically ill patients:  140 - 180 mg/dL    Latest Reference Range & Units 08/04/21 04:06 08/04/21 06:16  Glucose 70 - 99 mg/dL 571 (HH) 191 (H)    Latest Reference Range & Units 08/03/21 11:01 08/03/21 17:23 08/03/21 23:24  Glucose-Capillary 70 - 99 mg/dL 147 (H) 200 (H) 194 (H)   Review of Glycemic Control  Diabetes history: DM2 Outpatient Diabetes medications: Metformin 500 mg QAM Current orders for Inpatient glycemic control: None; CBGs  Inpatient Diabetes Program Recommendations:    Insulin: Lab glucose 571 mg/dl at 4:06 am and 191 mg/dl at 6:16 am today. No intervention for noted glucose of 571 mg/dl; anticipate lab of 571 mg/dl not accurate. While inpatient, please consider ordering Novolog 0-9 units TID with meals and Novolog 0-5 units QHS.  Thanks, Barnie Alderman, RN, MSN, [facility] Coordinator Inpatient Diabetes Program [phone] (Team Pager from 8am to [place])

## 2021-08-04 NOTE — Progress Notes (Signed)
SLP Cancellation Note  Patient Details Name: Elijah [name] MRN: [id number] DOB: [birth year]   Cancelled treatment:       Reason Eval/Treat Not Completed: Fatigue/lethargy limiting ability to participate;Patient's level of consciousness. Pt is moaning and did verbally respond some to SLP but when asked to open his eyes only slightly opened them and verbal responses are labored, slow and barely intelligible. Per RN note and wife report Pt was very alert and responsive last night 6/14 and consumed purees and thin liquids. Today Pt is not currently appropriate for PO. Recommend continue NPO until alertness improves. SLP will attempt ongoing diagnostic dysphagia treatment tomorrow. Thank you,  Wende Bushy 08/04/2021, 2:49 PM

## 2021-08-04 NOTE — Progress Notes (Signed)
*  PRELIMINARY RESULTS* Echocardiogram 2D Echocardiogram has been performed.  Elijah [name] 08/04/2021, 11:58 AM

## 2021-08-04 NOTE — Progress Notes (Signed)
Referring Physician(s): Dr. Constance Haw  Supervising Physician: Juliet Rude  Patient Status:  Elijah [name] - inpatient  Chief Complaint: Acute cholecystitis s/p percutaneous cholecystostomy 08/02/21 by Dr. Earleen Newport  Subjective: Patient remains in the ICU. His wife is at the bedside. He is alert and able to answer some questions. He is constantly moaning and states he is in pain.   Allergies: Aspirin, Coconut (cocos nucifera), and Penicillins  Medications: Prior to Admission medications   Medication Sig Start Date End Date Taking? Authorizing Provider  aspirin 81 MG tablet Take 81 mg by mouth daily.   Yes [provider]  Azelastine HCl 0.15 % SOLN Place 1 spray into both nostrils daily as needed (congestion).  08/18/15  Yes [provider]  Carbidopa-Levodopa ER (SINEMET CR) 25-100 MG tablet controlled release TAKE TWO TABLETS BY MOUTH EVERY MORNING, TWO TABLETS AT LUNCH, ONE TABLET AT dinner, AND two TABLET AT BEDTIME Patient taking differently: Take 1-2 tablets by mouth in the morning, at noon, in the evening, and at bedtime. 2 tabs in the am, 2 tabs at lunch, 1 tab at dinner, AND 2 tabs at bedtime 06/27/21  Yes Ward Givens, NP  cyclobenzaprine (FLEXERIL) 10 MG tablet Take 10 mg by mouth as needed for muscle spasms.   Yes [provider]  escitalopram (LEXAPRO) 10 MG tablet Take 5 mg by mouth daily. 07/30/19  Yes [provider]  furosemide (LASIX) 20 MG tablet Take 20 mg by mouth as needed for fluid.   Yes [provider]  LORazepam (ATIVAN) 1 MG tablet Take 1 mg by mouth at bedtime. 07/11/21  Yes [provider]  meloxicam (MOBIC) 15 MG tablet Take 15 mg by mouth daily.   Yes [provider]  metFORMIN (GLUCOPHAGE) 500 MG tablet Take 500 mg by mouth daily with breakfast.  10/05/12  Yes [provider]  metoprolol tartrate (LOPRESSOR) 25 MG tablet Take 12.5 mg by mouth 2 (two) times daily.   Yes [provider]  OLANZapine (ZYPREXA) 10 MG tablet Take 1 tablet (10 mg total) by mouth at bedtime. 06/16/21  Yes White, Aaron Edelman A, NP  pantoprazole (PROTONIX) 40 MG tablet Take 40 mg by mouth daily. 03/08/14  Yes [provider]  potassium chloride SA (K-DUR,KLOR-CON) 20 MEQ tablet Take 20 mEq by mouth as needed (cramps). 10/24/13  Yes [provider]  pramipexole (MIRAPEX) 0.75 MG tablet TAKE ONE TABLET BY MOUTH THREE TIMES DAILY Patient taking differently: Take 0.75 mg by mouth 3 (three) times daily. 01/19/20  Yes Kathrynn Ducking, MD  QUEtiapine (SEROQUEL) 25 MG tablet TAKE ONE TABLET BY MOUTH AT 10am, 4pm, AND 2 tablets at 9pm daily AS DIRECTED Patient taking differently: Take 25-50 mg by mouth 3 (three) times daily. 1 tablet at 10am, 4pm, AND 2 tablets at 9pm daily 07/06/21  Yes White, Louanna Raw, NP  selegiline (ELDEPRYL) 5 MG tablet Take 5 mg by mouth 2 (two) times daily with a meal.   Yes [provider]  simvastatin (ZOCOR) 40 MG tablet Take 20 mg by mouth at bedtime.  09/05/12  Yes [provider]  valsartan-hydrochlorothiazide (DIOVAN-HCT) 80-12.5 MG tablet Take 1 tablet by mouth daily.   Yes [provider]     Vital Signs: BP (!) 132/53 (BP Location: Left Arm)   Pulse 87   Temp 99.1 F (37.3 C) (Axillary)   Resp 19   Ht '5\' 10"'$  (1.778 m)   Wt 199 lb 11.8 oz (90.6 kg)  SpO2 99%   BMI 28.66 kg/m   Physical Exam Constitutional:      General: He is not in acute distress.    Appearance: He is ill-appearing.  Pulmonary:     Effort: Pulmonary effort is normal.  Abdominal:     Palpations: Abdomen is soft.     Tenderness: There is abdominal tenderness.     Comments: RUQ drain to gravity. Approximately 25 ml of bile in bag. Drain easily flushed/aspirated with 10 ml NS. Dressing is clean, dry and intact.   Skin:    General: Skin is warm and dry.     Coloration: Skin is not jaundiced.  Neurological:     Mental Status: He is alert.     Imaging: [place]  EKG SITE RITE  Result Date: 08/03/2021 If Site Rite image not attached, placement could not be confirmed due to current cardiac rhythm.  IR Perc Cholecystostomy  Result Date: 08/02/2021 INDICATION: 81 year old male referred for percutaneous cholecystostomy EXAM: CHOLECYSTOSTOMY MEDICATIONS: 500 mg levofloxacin; The antibiotic was administered within an appropriate time frame prior to the initiation of the procedure. ANESTHESIA/SEDATION: Moderate (conscious) sedation was employed during this procedure. A total of Versed 0.5 mg and Fentanyl 25 mcg was administered intravenously. Moderate Sedation Time: 11 minutes. The patient's level of consciousness and vital signs were monitored continuously by radiology nursing throughout the procedure under my direct supervision. FLUOROSCOPY TIME:  Fluoroscopy Time: 0 minutes 24 seconds (3 mGy). COMPLICATIONS: None PROCEDURE: Informed written consent was obtained from the patient and the patient's family after a thorough discussion of the procedural risks, benefits and alternatives. All questions were addressed. Maximal Sterile Barrier Technique was utilized including caps, mask, sterile gowns, sterile gloves, sterile drape, hand hygiene and skin antiseptic. A timeout was performed prior to the initiation of the procedure. Ultrasound survey of the right upper quadrant was performed for planning purposes. Once the patient is prepped and draped in the usual sterile fashion, the skin and subcutaneous tissues overlying the gallbladder were generously infiltrated 1% lidocaine for local anesthesia. A coaxial needle was advanced under ultrasound guidance through the skin subcutaneous tissues and a small segment of liver into the gallbladder lumen. With removal of the stylet, spontaneous dark bile drainage occurred. Using modified Seldinger technique, a 10 French drain was placed into the gallbladder fossa, with aspiration of the sample for the lab. Contrast injection confirmed  position of the tube within the gallbladder lumen. Drainage catheter was attached to gravity drain with a suture retention placed. Patient tolerated the procedure well and remained hemodynamically stable throughout. No complications were encountered and no significant blood loss encountered. IMPRESSION: Status post percutaneous cholecystostomy Signed, Dulcy Fanny. Nadene Rubins, RPVI [facility] Radiology Electronically Signed   By: Corrie Mckusick D.O.   On: 08/02/2021 14:33   NM Hepatobiliary Liver Func  Result Date: 08/01/2021 CLINICAL DATA:  Concern for cholecystitis. EXAM: NUCLEAR MEDICINE HEPATOBILIARY IMAGING TECHNIQUE: Sequential images of the abdomen were obtained out to 60 minutes following intravenous administration of radiopharmaceutical. 3 mm IV morphine administered to augment filling of the gallbladder. RADIOPHARMACEUTICALS:  5.5 mCi Tc-54m Choletec IV COMPARISON:  MRI 07/29/2021 FINDINGS: Uniform accumulation radiotracer within the liver. Counts are evident within the small bowel by 20 minutes. The gallbladder fails to fill at 60 minutes. IV morphine was administered to augment filling of the gallbladder. Post morphine injection, the gallbladder fails to fill. IMPRESSION: 1. Non filling of the gallbladder is consistent with obstruction of cystic duct /  acute cholecystitis. 2. Patent common bile duct. These results will be called to the ordering clinician or representative by the Radiologist Assistant, and communication documented in the PACS or [employer]. Electronically Signed   By: Suzy Bouchard M.D.   On: 08/01/2021 11:52   CT HEAD WO CONTRAST (5MM)  Result Date: 07/31/2021 CLINICAL DATA:  Altered mental status EXAM: CT HEAD WITHOUT CONTRAST TECHNIQUE: Contiguous axial images were obtained from the base of the skull through the vertex without intravenous contrast. RADIATION DOSE REDUCTION: This exam was performed according to the  departmental dose-optimization program which includes automated exposure control, adjustment of the mA and/or kV according to patient size and/or use of iterative reconstruction technique. COMPARISON:  03/19/2021 FINDINGS: Brain: No acute intracranial findings are seen. There are no signs of bleeding within the cranium. Cortical sulci are prominent more so in the left cerebral hemisphere. There is no focal effacement of cortical sulci. Vascular: Unremarkable. Skull: Unremarkable. Sinuses/Orbits: There is mucosal thickening in the ethmoid sinus. Other: None. IMPRESSION: No acute intracranial findings are seen in noncontrast CT brain. Atrophy. Electronically Signed   By: Elmer Picker M.D.   On: 07/31/2021 14:40    Labs:  CBC: Recent Labs    07/31/21 0330 08/01/21 0222 08/03/21 0353 08/04/21 0406  WBC 12.8* 16.7* 19.6* 14.9*  HGB 10.3* 11.7* 11.1* 9.0*  HCT 29.6* 34.2* 34.7* 28.7*  PLT 72* 92* 177 191    COAGS: Recent Labs    07/28/21 2231 07/29/21 0345  INR 1.2 1.4*    BMP: Recent Labs    08/01/21 1434 08/03/21 0353 08/04/21 0406 08/04/21 0616  [name] 151* 158* 151* 159*  K 3.7 4.0 3.7 4.3  CL 125* >130* 127* >130*  CO2 23 21* 22 23  GLUCOSE 158* 182* 571* 191*  BUN 85* 67* 54* 60*  CALCIUM 7.8* 8.0* 6.9* 7.8*  CREATININE 1.59* 1.37* 1.25* 1.36*  GFRNONAA 44* 53* 59* 53*    LIVER FUNCTION TESTS: Recent Labs    07/30/21 0444 07/31/21 0330 08/01/21 1434 08/03/21 0353  BILITOT 5.9* 4.4* 3.1* 2.1*  AST 68* 52* 32 23  ALT '10 12 28 22  '$ ALKPHOS 127* 137* 169* 131*  PROT 5.3* 5.2* 5.2* 5.7*  ALBUMIN 2.4* 2.1* 2.0* 1.9*    Assessment and Plan:  Acute cholecystitis s/p percutaneous cholecystostomy 08/02/21 by Dr. Earleen Newport  Leukocytosis resolving. Liver tests improving. Patient remains in the ICU. Mentation seems improved today compared to prior assessments. Patient still endorses abdominal pain.   Drain Location: RUQ Size: Fr size: 10 Fr Date of placement:  08/02/21 Currently to: Drain collection device: gravity 24 hour output:  Output by Drain (mL) 08/02/21 0701 - 08/02/21 1900 08/02/21 1901 - 08/03/21 0700 08/03/21 0701 - 08/03/21 1900 08/03/21 1901 - 08/04/21 0700 08/04/21 0701 - 08/04/21 1101  Biliary Tube Cook slip-coat 10 Fr. RUQ 75  230 200     Interval imaging/drain manipulation:  None  Current examination: Flushes/aspirates easily.  Insertion site unremarkable. Suture and stat lock in place. Dressed appropriately.   Plan: Continue TID flushes with 5 cc NS. Record output Q shift. Dressing changes QD or PRN if soiled.  Call IR APP or on call IR MD if difficulty flushing or sudden change in drain output.  Repeat imaging/possible drain injection once output < 10 mL/QD (excluding flush material.)  Discharge planning: Please contact IR APP or on call IR MD prior to patient d/c to ensure appropriate follow up plans are in place. Typically patient will follow up  with IR clinic 10-14 days post d/c for repeat imaging/possible drain injection. IR scheduler will contact patient with date/time of appointment. Patient will need to flush drain QD with 5 cc NS, record output QD, dressing changes every 2-3 days or earlier if soiled.   IR will continue to follow - please call with questions or concerns.  Electronically Signed: Soyla Dryer, AGACNP-BC [phone] 08/04/2021, 10:52 AM   I spent a total of 15 Minutes at the the patient's bedside AND on the patient's hospital floor or unit, greater than 50% of which was counseling/coordinating care for percutaneous cholecystostomy

## 2021-08-04 NOTE — Progress Notes (Signed)
Palliative:  Elijah [name] is lying quietly in bed. He appears acutely/chronically ill and frail.  He will open his eyes, making but not keeping eye contact.  He is able to say hello today.  He still cannot make his basic needs known.  His wife, Elijah [name], is present at bedside.   We talk about mental status improvement, Elijah [name] labs, including but not limited to, WBC and sodium.  We also talk about the treatment plan and time for outcomes. We talk about by mouth intake.   Conference with attending, bedside nursing staff and [facility] team related to patient condition, needs, [place] and disposition.   Plan:    Continue full scope/full code.  Time for outcomes. Would accept STR if qualified/PNC choice.    58 minutes  Elijah Axe, NP Palliative Medicine Team  Team Phone [phone] Greater than 50% of this time was spent counseling and coordinating care related to the above assessment and plan.

## 2021-08-04 NOTE — Progress Notes (Signed)
With family at bedside, patient is more awake and alert. He is able to open his eyes and remain communicative.  Elijah [name] was able to take suppertime carbidopa-levodopa CR tablet with ice cream. Fed patient the entire ice cream container. Drank iced tea on supper tray. Unable to eat/chew the meatloaf safely, so educated family not to feed him meatloaf.  SLP to see patient tomorrow for evaluation.

## 2021-08-05 DIAGNOSIS — G9341 Metabolic encephalopathy: Secondary | ICD-10-CM | POA: Diagnosis not present

## 2021-08-05 DIAGNOSIS — D696 Thrombocytopenia, unspecified: Secondary | ICD-10-CM

## 2021-08-05 DIAGNOSIS — K810 Acute cholecystitis: Secondary | ICD-10-CM | POA: Diagnosis not present

## 2021-08-05 DIAGNOSIS — J9601 Acute respiratory failure with hypoxia: Secondary | ICD-10-CM | POA: Diagnosis not present

## 2021-08-05 DIAGNOSIS — A419 Sepsis, unspecified organism: Secondary | ICD-10-CM | POA: Diagnosis not present

## 2021-08-05 LAB — BLOOD CULTURE ID PANEL (REFLEXED) - BCID2

## 2021-08-05 LAB — CBC
HCT: 30.5 % — ABNORMAL LOW (ref 39.0–52.0)
Hemoglobin: 9.4 g/dL — ABNORMAL LOW (ref 13.0–17.0)
MCH: 31 pg (ref 26.0–34.0)
MCHC: 30.8 g/dL (ref 30.0–36.0)
MCV: 100.7 fL — ABNORMAL HIGH (ref 80.0–100.0)
Platelets: 270 10*3/uL (ref 150–400)
RBC: 3.03 MIL/uL — ABNORMAL LOW (ref 4.22–5.81)
RDW: 16.1 % — ABNORMAL HIGH (ref 11.5–15.5)
WBC: 16.5 10*3/uL — ABNORMAL HIGH (ref 4.0–10.5)
nRBC: 0 % (ref 0.0–0.2)

## 2021-08-05 LAB — BASIC METABOLIC PANEL
Anion gap: 3 — ABNORMAL LOW (ref 5–15)
BUN: 52 mg/dL — ABNORMAL HIGH (ref 8–23)
CO2: 23 mmol/L (ref 22–32)
Calcium: 7.5 mg/dL — ABNORMAL LOW (ref 8.9–10.3)
Chloride: 128 mmol/L — ABNORMAL HIGH (ref 98–111)
Creatinine, Ser: 1.22 mg/dL (ref 0.61–1.24)
GFR, Estimated: >60 mL/min (ref >60)
Glucose, Bld: 203 mg/dL — ABNORMAL HIGH (ref 70–99)
Potassium: 4.1 mmol/L (ref 3.5–5.1)
Sodium: 154 mmol/L — ABNORMAL HIGH (ref 135–145)

## 2021-08-05 LAB — MAGNESIUM: Magnesium: 2.3 mg/dL (ref 1.7–2.4)

## 2021-08-05 LAB — VITAMIN B12: Vitamin B-12: 1750 pg/mL — ABNORMAL HIGH (ref 180–914)

## 2021-08-05 LAB — TSH: TSH: 11.749 u[IU]/mL — ABNORMAL HIGH (ref 0.350–4.500)

## 2021-08-05 LAB — FOLATE: Folate: 27.1 ng/mL (ref >5.9)

## 2021-08-05 LAB — AMMONIA: Ammonia: 22 umol/L (ref 9–35)

## 2021-08-05 LAB — GLUCOSE, CAPILLARY
Glucose-Capillary: 154 mg/dL — ABNORMAL HIGH (ref 70–99)
Glucose-Capillary: 177 mg/dL — ABNORMAL HIGH (ref 70–99)

## 2021-08-05 LAB — HEMOGLOBIN A1C
Hgb A1c MFr Bld: 6.7 % — ABNORMAL HIGH (ref 4.8–5.6)
Mean Plasma Glucose: 145.59 mg/dL

## 2021-08-05 LAB — T4, FREE: Free T4: 1.04 ng/dL (ref 0.61–1.12)

## 2021-08-05 MED ORDER — HYDROMORPHONE HCL 1 MG/ML IJ SOLN
0.5000 mg | INTRAMUSCULAR | Status: DC | PRN
  Administered 2021-08-05 – 2021-08-06 (×3): 0.5 mg via INTRAVENOUS
  Filled 2021-08-05 (×3): qty 0.5

## 2021-08-05 MED ORDER — HYDROMORPHONE HCL 1 MG/ML IJ SOLN
0.5000 mg | INTRAMUSCULAR | Status: DC | PRN
Start: 2021-08-05 — End: 2021-08-05

## 2021-08-05 NOTE — Plan of Care (Signed)

## 2021-08-05 NOTE — Progress Notes (Signed)
Patient opening his eyes & more alert at this time, he is answering questions asked by this RN & family, asked for chocolate ice cream which he is eating at this time, has also consumed small bites of his lunch including mashed potatoes & gravy & mashed up veggies, does well with soft foods, will request soft diet for patient at this time

## 2021-08-05 NOTE — Progress Notes (Signed)
Speech Language Pathology Treatment: Dysphagia  Patient Details Name: Elijah [name] MRN: [id number] DOB: [birth year] Today's Date: 08/05/2021 Time: 5397-6734 SLP Time Calculation (min) (ACUTE ONLY): 26 min  Assessment / Plan / Recommendation Clinical Impression  Ongoing diagnostic dysphagia treatment provided today; Significant improvement in alertness in contrast to previous attempts and BSE. Pt was easily roused and was responsive and appropriate for PO trials. Pt consumed 240 ccs of water via straw, 4oz of apple sauce and then requested ice cream all without overt s/sx of oropharyngeal dysphagia. Pt's positioning is poor as he leans to the R and is contracted. Pt reports pain with attempts to reposition; despite sub-optimal positioning no coughing or overt s/sx of aspiration were noted. Recommend continue with D2 diet and thin liquids, straws are ok. Recommend crush meds in puree. ST will continue to follow acutely. Thank you,   HPI HPI: GRAE CANNATA is a 81 y.o. male with medical history significant of history of chronic low back pain, diabetes mellitus type 2, hyperlipidemia, hypertension, Parkinson disease, obstructive sleep apnea, history of biliary obstruction and pancreatitis with ERCP in 2018, presents ED with chief complaint of dyspnea.  At time of presentation wife was at bedside.  She reported to the ED provider that patient has had 24 hours of shortness of breath and increased work of breathing.  He was recently seen in the ED 2 days ago for back pain, which was acute on chronic.  They had denied fevers at home, but wife did report the patient had left arm pain when she was trying to lift him up.  ED provider note reports that patient denied current abdominal pain.  Patient is not answering questions for me, but does have voluntary guarding with palpation of his abdomen.  Patient arrived in the ER satting in the low 90s on 4 L nasal cannula.  He was put on nonrebreather for work of  breathing.  Sepsis protocol was started.  Patient was treated with vancomycin, cefepime, Flagyl, and given a 3 L bolus.  Lactic acid was significantly elevated 6.3, repeat 4.6.  Chest x-ray was negative for acute disease, CT abdomen pelvis did not given definitive explanation of his symptoms either.  Urine was borderline.  Patient did have an AKI.  Admission was requested for further work-up of altered mental status, dyspnea, and most likely sepsis. Status post cholecystostomy tube placement on 08/02/2021 with bilious drainage present.  Still with leukocytosis.  Hyperbilirubinemia decreasing.  LFTs within normal limits. BSE requested last week and Pt now more alert.      SLP Plan  Continue with current plan of care      Recommendations for follow up therapy are one component of a multi-disciplinary discharge planning process, led by the attending physician.  Recommendations may be updated based on patient status, additional functional criteria and insurance authorization.    Recommendations  Diet recommendations: Dysphagia 2 (fine chop);Thin liquid Liquids provided via: Straw Medication Administration: Crushed with puree Supervision: Full supervision/cueing for compensatory strategies Compensations: Minimize environmental distractions;Slow rate;Small sips/bites                Oral Care Recommendations: Oral care prior to ice chip/H20;Staff/trained caregiver to provide oral care Follow Up Recommendations: Skilled nursing-short term rehab (<3 hours/day) Assistance recommended at discharge: Frequent or constant Supervision/Assistance SLP Visit Diagnosis: Dysphagia, unspecified (R13.10) Plan: Continue with current plan of care          Jamonica Schoff H. Roddie Mc, [place] Speech Language Pathologist  Almetta Lovely  Devetta Hagenow  08/05/2021, 4:17 PM

## 2021-08-05 NOTE — Progress Notes (Signed)
PROGRESS NOTE  Elijah [name] NIO:[id number] DOB: 1940-06-05 DOA: 07/28/2021 PCP: Celene Squibb, MD  Brief History:  As per H&P written by Dr.Zierle-Ghosh On 07/29/2021 Elijah [name] is a 81 y.o. male with medical history significant of history of chronic low back pain, diabetes mellitus type 2, hyperlipidemia, hypertension, Parkinson disease, obstructive sleep apnea, history of biliary obstruction and pancreatitis with ERCP in 2018, presents ED with chief complaint of dyspnea.  At time of presentation wife was at bedside.  She reported to the ED provider that patient has had 24 hours of shortness of breath and increased work of breathing.  He was recently seen in the ED 2 days ago for back pain, which was acute on chronic.  They had denied fevers at home, but wife did report the patient had left arm pain when she was trying to lift him up.  ED provider note reports that patient denied current abdominal pain.  Patient is not answering questions for me, but does have voluntary guarding with palpation of his abdomen.  Patient arrived in the ER satting in the low 90s on 4 L nasal cannula.  He was put on nonrebreather for work of breathing.  Sepsis protocol was started.  Patient was treated with vancomycin, cefepime, Flagyl, and given a 3 L bolus.  Lactic acid was significantly elevated 6.3, repeat 4.6.  Chest x-ray was negative for acute disease, CT abdomen pelvis did not given definitive explanation of his symptoms either.  Urine was borderline.  Patient did have an AKI.  Admission was requested for further work-up of altered mental status, dyspnea, and sepsis.  Patient was found to have GBS bacteremia with source as acute cholecystitis.  He was not a surgical candidate.  IR placed cholecystotomy tube on 6/13.  His IV antibiotics were continued.  Echo (TTE) was neg for vegetations.  His IVF was adjusted for his hypernatremia and his hydromorphone dosing was decreased with some improvement of his  encephalopathy.   Assessment and Plan: * Severe sepsis (HCC) -Heart rate 100, respiratory rate 30-44, lactic acidosis 6.3, AKI 2.24 -Bacteremia, acute cholecystitis and aspiration pneumonitis -Continue IV Rocephin -WBC's trending down after chole tube -Off BPAP and with good saturation demonstrated on 4-5 L [facility] supplementation with great saturation. -COVID and flu are negative    Bacteremia due to group B Streptococcus 6/15 Repeat blood culture--GPC 1 of 2 sets Echo--EF 70-75%, no WMA, G1DD, no vegetation, trivial MR, mild TR Source - GB   Acute cholecystitis -as mentioned, patient found not a candidate for surgery - IR placed cholecystostomy tube -Continue supportive care and IV antibiotics.  AKI (acute kidney injury) ([place]) -Creatinine normally in the 0.6-0.8 range at baseline; at time of admission 2.24 and peaked at 2.72; now down to 1.36.>>1.22 -Most likely in the setting of infection process, volume depletion and sepsis  -Continue IVF  Thrombocytopenia ([place]) -due to sepsis -Platelets count nadir 70sK -improving with sepsis tx -Holding heparin products -SCDs for DVT prophylaxis.  Acute respiratory failure with hypoxia ([place]) -Patient with good saturation currently on 1-2 L. -Patient had increasing work of breathing to the point of requiring transient use of BiPAP at time of admission. -Continue current IV antibiotics -now stable on RA  Hypernatremia - Na up to 158 -chenge to D5W>>increase to 150 cc/hr  Acute metabolic encephalopathy -Baseline>>pleasant confusion with hallucinations -Patient with underlying history of mood disorder/dementia and Parkinson. -Continue to be lethargic--but slowly improving -6/11 CT  head--neg -ammonia = 41>>22 -6/11 ABG 7.45/32/77/22 on 2L -Palliative care consulted. -remains encephalopathic on 6/16, but more awake and occasionally follows commands -decrease hydromorphone  HLD (hyperlipidemia) -Patient mentation preventing him to  be able to take oral medication -Holding statin for now.  Lactic acidosis -Secondary to sepsis -Initial lactic acid 6.3, repeat 4.6>> last one 1.1 -Continue to maintain adequate IV hydration  Total bilirubin, elevated -Patient does have history of biliary obstruction and ERCP back in 2018 -CT abd--bilateral inguinal hernias, herniated bladder on the right, small nonobstructing stone, and gallstones without complication -MRCP--no retained CBD stones or any masses in his liver. -LFTs has continued trending down overall. -Continue IV antibiotics -After discussing with general surgery>>check HIDA scan; which demonstrated positive result for acute cholecystitis and cystic duct obstruction. -not a candidate for cholecystectomy/surgical intervention. -6/13--cholecystotomy tube  Essential hypertension -Continue holding ARB and  HCTZ due to AKI -Blood pressure has now stabilized; -continue  low-dose Lopressor IV. -BP and HR now controlled.   Diabetes mellitus without complication ([place]) -holding metformin -Will continue to monitor glucose with CBGs and if it starts trending up will add insulin coverage. -so far CBG's are stable. -6/16 A1C--6.7  Parkinson disease ([place]) -pt on Sinemet when able to take by mouth, but poor mentation precludes po intake -Patient with underlying mood disorder and mild hallucinations/psychosis as per patient's wife reports>>>worse over past 6-12 months -Too lethargic to safely take oral meds currently. -Unable to place NG tube on 07/30/21 (very traumatic and never able to passed from his neck).        Family Communication:   spouse updated 6/16  Consultants:  palliative  Code Status:  FULL  DVT Prophylaxis:  SCDs   Procedures: As Listed in Progress Note Above  Antibiotics: Ceftriaxone 6/11>> Cefepime 6/8>>6/11         Subjective: Patient is awake.  Intermittently follows commands.  Feels hungry.  Denies f/c, cp, n/v.  Has some abd pain.   ROS limited by encephalopathy  Objective: Vitals:   08/05/21 1100 08/05/21 1200 08/05/21 1300 08/05/21 1315  BP: (!) 116/40 (!) 140/40 (!) 113/38   Pulse: 75 79 83 85  Resp: 17 (!) 21 (!) 22 (!) 25  Temp:      TempSrc:      SpO2: 95% 95% 97% 97%  Weight:      Height:        Intake/Output Summary (Last 24 hours) at 08/05/2021 1711 Last data filed at 08/05/2021 1500 Gross per 24 hour  Intake 4418.78 ml  Output 325 ml  Net 4093.78 ml   Weight change: -0.8 kg Exam:  General:  Pt is alert, intermittently follows commands appropriately, not in acute distress HEENT: No icterus, No thrush, No neck mass, [place]/AT Cardiovascular: RRR, S1/S2, no rubs, no gallops Respiratory: bibasilar rales. No wheeze Abdomen: Soft/+BS, non tender, non distended, no guarding Extremities: trace LE edema, No lymphangitis, No petechiae, No rashes, no synovitis   Data Reviewed: I have personally reviewed following labs and imaging studies Basic Metabolic Panel: Recent Labs  Lab 08/01/21 1434 08/03/21 0353 08/04/21 0406 08/04/21 0616 08/05/21 0327  NA 151* 158* 151* 159* 154*  K 3.7 4.0 3.7 4.3 4.1  CL 125* >130* 127* >130* 128*  CO2 23 21* '22 23 23  '$ GLUCOSE 158* 182* 571* 191* 203*  BUN 85* 67* 54* 60* 52*  CREATININE 1.59* 1.37* 1.25* 1.36* 1.22  CALCIUM 7.8* 8.0* 6.9* 7.8* 7.5*  MG  --   --  2.2  --  2.3   Liver Function Tests: Recent Labs  Lab 07/30/21 0444 07/31/21 0330 08/01/21 1434 08/03/21 0353  AST 68* 52* 32 23  ALT '10 12 28 22  '$ ALKPHOS 127* 137* 169* 131*  BILITOT 5.9* 4.4* 3.1* 2.1*  PROT 5.3* 5.2* 5.2* 5.7*  ALBUMIN 2.4* 2.1* 2.0* 1.9*   Recent Labs  Lab 07/30/21 0444  LIPASE 18   Recent Labs  Lab 07/31/21 1423 08/05/21 0327  AMMONIA 41* 22   Coagulation Profile: No results for input(s): "INR", "PROTIME" in the last 168 hours. CBC: Recent Labs  Lab 07/30/21 0444 07/31/21 0330 08/01/21 0222 08/03/21 0353 08/04/21 0406 08/05/21 0327  WBC 13.6* 12.8*  16.7* 19.6* 14.9* 16.5*  NEUTROABS 12.2* 10.5*  --   --   --   --   HGB 10.7* 10.3* 11.7* 11.1* 9.0* 9.4*  HCT 30.4* 29.6* 34.2* 34.7* 28.7* 30.5*  MCV 91.6 91.9 92.2 98.3 101.1* 100.7*  PLT 75* 72* 92* 177 191 270   Cardiac Enzymes: No results for input(s): "CKTOTAL", "CKMB", "CKMBINDEX", "TROPONINI" in the last 168 hours. BNP: Invalid input(s): "POCBNP" CBG: Recent Labs  Lab 08/04/21 1108 08/04/21 1724 08/04/21 2316 08/05/21 0630 08/05/21 1239  GLUCAP 161* 180* 168* 154* 177*   HbA1C: Recent Labs    08/05/21 0327  HGBA1C 6.7*   Urine analysis:    Component Value Date/Time   COLORURINE AMBER (A) 07/29/2021 1000   APPEARANCEUR CLOUDY (A) 07/29/2021 1000   LABSPEC 1.023 07/29/2021 1000   PHURINE 5.0 07/29/2021 1000   GLUCOSEU 50 (A) 07/29/2021 1000   HGBUR SMALL (A) 07/29/2021 1000   BILIRUBINUR NEGATIVE 07/29/2021 1000   KETONESUR 5 (A) 07/29/2021 1000   PROTEINUR 30 (A) 07/29/2021 1000   UROBILINOGEN 0.2 10/14/2009 1851   NITRITE NEGATIVE 07/29/2021 1000   LEUKOCYTESUR NEGATIVE 07/29/2021 1000   Sepsis Labs: '@LABRCNTIP'$ (procalcitonin:4,lacticidven:4) ) Recent Results (from the past 240 hour(s))  Culture, blood (Routine x 2)     Status: Abnormal   Collection Time: 07/28/21 10:33 PM   Specimen: BLOOD LEFT WRIST  Result Value Ref Range Status   Specimen Description   Final    BLOOD LEFT WRIST Performed at [facility], [address], [place], [place] [address]    Special Requests   Final    BOTTLES DRAWN AEROBIC AND ANAEROBIC Blood Culture adequate volume Performed at [facility], [address] [place], [place] [address]    Culture  Setup Time   Final    GRAM POSITIVE COCCI IN BOTH AEROBIC AND ANAEROBIC BOTTLES Gram Stain Report Called to,Read Back By and Verified With: L[place] @ 3244 ON [id number] BY HENDERSON L CRITICAL VALUE NOTED.  VALUE IS CONSISTENT WITH PREVIOUSLY REPORTED AND CALLED VALUE.    Culture (A)  Final    GROUP B  STREP(S.AGALACTIAE)ISOLATED SUSCEPTIBILITIES PERFORMED ON PREVIOUS CULTURE WITHIN THE LAST 5 DAYS. Performed at [facility], [place] [employer] [address] [place], [place] [address]    Report Status 07/31/2021 FINAL  Final  Culture, blood (Routine x 2)     Status: Abnormal   Collection Time: 07/28/21 10:33 PM   Specimen: BLOOD RIGHT HAND  Result Value Ref Range Status   Specimen Description   Final    BLOOD RIGHT HAND Performed at [facility], [address], [place], [place] [address]    Special Requests   Final    BOTTLES DRAWN AEROBIC AND ANAEROBIC Blood Culture results may not be optimal due to an excessive volume of blood received in culture bottles Performed  at [facility], [address], [place], [place] [address]    Culture  Setup Time   Final    GRAM POSITIVE COCCI IN BOTH AEROBIC AND ANAEROBIC BOTTLES Gram Stain Report Called to,Read Back By and Verified With: CHILDRESS @ 0845 ON [id number] BY HENDERSON L CRITICAL RESULT CALLED TO, READ BACK BY AND VERIFIED WITH: Estelle June 3295 [id number] FCP Performed at [facility], [place] [address], [place], [place] [address]    Culture GROUP B STREP(S.AGALACTIAE)ISOLATED (A)  Final   Report Status 07/31/2021 FINAL  Final   Organism ID, Bacteria GROUP B STREP(S.AGALACTIAE)ISOLATED  Final      Susceptibility   Group b strep(s.agalactiae)isolated - MIC*    CLINDAMYCIN <=0.25 SENSITIVE Sensitive     AMPICILLIN <=0.25 SENSITIVE Sensitive     ERYTHROMYCIN <=0.12 SENSITIVE Sensitive     VANCOMYCIN 0.5 SENSITIVE Sensitive     CEFTRIAXONE <=0.12 SENSITIVE Sensitive     LEVOFLOXACIN 1 SENSITIVE Sensitive     * GROUP B STREP(S.AGALACTIAE)ISOLATED  Blood Culture ID Panel (Reflexed)     Status: Abnormal   Collection Time: 07/28/21 10:33 PM  Result Value Ref Range Status   Enterococcus faecalis NOT DETECTED NOT DETECTED Final   Enterococcus Faecium NOT DETECTED NOT DETECTED Final   Listeria monocytogenes NOT DETECTED NOT DETECTED Final    Staphylococcus species NOT DETECTED NOT DETECTED Final   Staphylococcus aureus (BCID) NOT DETECTED NOT DETECTED Final   Staphylococcus epidermidis NOT DETECTED NOT DETECTED Final   Staphylococcus lugdunensis NOT DETECTED NOT DETECTED Final   Streptococcus species DETECTED (A) NOT DETECTED Final    Comment: CRITICAL RESULT CALLED TO, READ BACK BY AND VERIFIED WITH: PHARMD STEVEN H [id number] FCP    Streptococcus agalactiae DETECTED (A) NOT DETECTED Final    Comment: CRITICAL RESULT CALLED TO, READ BACK BY AND VERIFIED WITH: PHARMD STEVEN H 1457 [id number] FCP    Streptococcus pneumoniae NOT DETECTED NOT DETECTED Final   Streptococcus pyogenes NOT DETECTED NOT DETECTED Final   A.calcoaceticus-baumannii NOT DETECTED NOT DETECTED Final   Bacteroides fragilis NOT DETECTED NOT DETECTED Final   Enterobacterales NOT DETECTED NOT DETECTED Final   Enterobacter cloacae complex NOT DETECTED NOT DETECTED Final   Escherichia coli NOT DETECTED NOT DETECTED Final   Klebsiella aerogenes NOT DETECTED NOT DETECTED Final   Klebsiella oxytoca NOT DETECTED NOT DETECTED Final   Klebsiella pneumoniae NOT DETECTED NOT DETECTED Final   Proteus species NOT DETECTED NOT DETECTED Final   Salmonella species NOT DETECTED NOT DETECTED Final   Serratia marcescens NOT DETECTED NOT DETECTED Final   Haemophilus influenzae NOT DETECTED NOT DETECTED Final   Neisseria meningitidis NOT DETECTED NOT DETECTED Final   Pseudomonas aeruginosa NOT DETECTED NOT DETECTED Final   Stenotrophomonas maltophilia NOT DETECTED NOT DETECTED Final   Candida albicans NOT DETECTED NOT DETECTED Final   Candida auris NOT DETECTED NOT DETECTED Final   Candida glabrata NOT DETECTED NOT DETECTED Final   Candida krusei NOT DETECTED NOT DETECTED Final   Candida parapsilosis NOT DETECTED NOT DETECTED Final   Candida tropicalis NOT DETECTED NOT DETECTED Final   Cryptococcus neoformans/gattii NOT DETECTED NOT DETECTED Final    Comment: Performed at  [facility], [place]. [address] [place], [place] [address]  Resp Panel by RT-PCR (Flu A&B, Covid) Anterior Nasal Swab     Status: None   Collection Time: 07/28/21 10:51 PM   Specimen: Anterior Nasal Swab  Result Value Ref Range Status   SARS Coronavirus 2 by  RT PCR NEGATIVE NEGATIVE Final    Comment: (NOTE) SARS-CoV-2 target nucleic acids are NOT DETECTED.  The SARS-CoV-2 RNA is generally detectable in upper respiratory specimens during the acute phase of infection. The lowest concentration of SARS-CoV-2 viral copies this assay can detect is 138 copies/mL. A negative result does not preclude SARS-Cov-2 infection and should not be used as the sole basis for treatment or other patient management decisions. A negative result may occur with  improper specimen collection/handling, submission of specimen other than nasopharyngeal swab, presence of viral mutation(s) within the areas targeted by this assay, and inadequate number of viral copies(<138 copies/mL). A negative result must be combined with clinical observations, patient history, and epidemiological information. The expected result is Negative.  Fact Sheet for Patients:  EntrepreneurPulse.com.au  Fact Sheet for Healthcare Providers:  IncredibleEmployment.be  This test is no t yet approved or cleared by the [place] FDA and  has been authorized for detection and/or diagnosis of SARS-CoV-2 by FDA under an Emergency Use Authorization (EUA). This EUA will remain  in effect (meaning this test can be used) for the duration of the COVID-19 declaration under Section 564(b)(1) of the Act, 21 U.S.C.section 360bbb-3(b)(1), unless the authorization is terminated  or revoked sooner.       Influenza A by PCR NEGATIVE NEGATIVE Final   Influenza B by PCR NEGATIVE NEGATIVE Final    Comment: (NOTE) The Xpert Xpress SARS-CoV-2/FLU/RSV plus assay is intended as an aid in the diagnosis of influenza  from Nasopharyngeal swab specimens and should not be used as a sole basis for treatment. Nasal washings and aspirates are unacceptable for Xpert Xpress SARS-CoV-2/FLU/RSV testing.  Fact Sheet for Patients: EntrepreneurPulse.com.au  Fact Sheet for Healthcare Providers: IncredibleEmployment.be  This test is not yet approved or cleared by the [place] FDA and has been authorized for detection and/or diagnosis of SARS-CoV-2 by FDA under an Emergency Use Authorization (EUA). This EUA will remain in effect (meaning this test can be used) for the duration of the COVID-19 declaration under Section 564(b)(1) of the Act, 21 U.S.C. section 360bbb-3(b)(1), unless the authorization is terminated or revoked.  Performed at [facility], [address] [place], [place] [address]   Urine Culture     Status: None   Collection Time: 07/29/21  2:19 AM   Specimen: Urine, Clean Catch  Result Value Ref Range Status   Specimen Description   Final    URINE, CLEAN CATCH Performed at [facility], [address] [place], [place] [address]    Special Requests   Final    NONE Performed at [facility], [address], [place], [place] [address]    Culture   Final    NO GROWTH Performed at [facility], [place] [address] [place], [place] [address]    Report Status 07/31/2021 FINAL  Final  MRSA Next Gen by PCR, Nasal     Status: None   Collection Time: 07/29/21 10:27 AM   Specimen: Nasal Mucosa; Nasal Swab  Result Value Ref Range Status   MRSA by PCR Next Gen NOT DETECTED NOT DETECTED Final    Comment: (NOTE) The GeneXpert MRSA Assay (FDA approved for NASAL specimens only), is one component of a comprehensive MRSA colonization surveillance program. It is not intended to diagnose MRSA infection nor to guide or monitor treatment for MRSA infections. Test performance is not FDA approved in patients less than 36 years old. Performed at [facility], [address], [place], [place] [address]  Aerobic/Anaerobic Culture w Gram Stain (surgical/deep wound)     Status: Abnormal (Preliminary result)   Collection Time: 08/02/21  2:25 PM   Specimen: Gallbladder; Bile  Result Value Ref Range Status   Specimen Description   Final    GALL BLADDER Performed at [facility], [address], [place], [place] [address]    Special Requests   Final    NONE Performed at [facility], [address], [place], [place] [address]    Gram Stain   Final    NO WBC SEEN FEW GRAM POSITIVE COCCI IN PAIRS FEW GRAM NEGATIVE RODS MODERATE BRANCHING, BEADING  GRAM POSITIVE RODS Performed at [facility], [place] [address] [place], [place] [address]    Culture (A)  Final    MULTIPLE ORGANISMS PRESENT, NONE PREDOMINANT NO GROUP A STREP (S.PYOGENES) ISOLATED NO STAPHYLOCOCCUS AUREUS ISOLATED NO ANAEROBES ISOLATED; CULTURE IN PROGRESS FOR 5 DAYS    Report Status PENDING  Incomplete  Culture, blood (Routine X 2) w Reflex to ID Panel     Status: None (Preliminary result)   Collection Time: 08/04/21  4:06 AM   Specimen: Right Antecubital; Blood  Result Value Ref Range Status   Specimen Description RIGHT ANTECUBITAL  Final   Special Requests   Final    BOTTLES DRAWN AEROBIC AND ANAEROBIC Blood Culture adequate volume   Culture  Setup Time   Final    GRAM POSITIVE COCCI ANAEROBIC BOTTLE ONLY Gram Stain Report Called to,Read Back By and Verified With: MCGIBBONY @ 7510 ON [id number] BY HENDERSON L    Culture   Final    NO GROWTH < 24 HOURS Performed at [facility], [address], [place], [place] [address]    Report Status PENDING  Incomplete  Culture, blood (Routine X 2) w Reflex to ID Panel     Status: None (Preliminary result)   Collection Time: 08/04/21  6:15 AM   Specimen: BLOOD LEFT HAND  Result Value Ref Range Status   Specimen Description BLOOD LEFT HAND  Final   Special Requests   Final    BOTTLES DRAWN AEROBIC AND ANAEROBIC Blood Culture adequate volume    Culture   Final    NO GROWTH < 24 HOURS Performed at [facility], [address] [place],  [address]    Report Status PENDING  Incomplete     Scheduled Meds:  aspirin  300 mg Rectal Daily   bisacodyl  10 mg Rectal Daily   budesonide (PULMICORT) nebulizer solution  0.5 mg Nebulization BID   Carbidopa-Levodopa ER  1 tablet Oral Q supper   Carbidopa-Levodopa ER  2 tablet Oral TID   Chlorhexidine Gluconate Cloth  6 each Topical Q0600   metoprolol tartrate  2.5 mg Intravenous Q12H   sodium chloride flush  10-40 mL Intracatheter Q12H   sodium chloride flush  5-10 mL Intracatheter Q8H   Continuous Infusions:  cefTRIAXone (ROCEPHIN)  IV 2 g (08/04/21 1950)   dextrose 150 mL/hr at 08/05/21 1231    Procedures/Studies: ECHOCARDIOGRAM COMPLETE  Result Date: 08/04/2021    ECHOCARDIOGRAM REPORT   Patient Name:   JOHNROSS NABOZNY Date of Exam: 08/04/2021 Medical Rec #:  [id number]       Height:       70.0 in Accession #:    [id number]      Weight:       199.7 lb Date of Birth:  Dec 17, 1940      BSA:  2.086 m Patient Age:    29 years        BP:           139/46 mmHg Patient Gender: M               HR:           81 bpm. Exam Location:  [facility] Procedure: 2D Echo, Cardiac Doppler and Color Doppler Indications:    Bacteremia  History:        Patient has no prior history of Echocardiogram examinations.                 Signs/Symptoms:Bacteremia; Risk Factors:Hypertension, Diabetes                 and Dyslipidemia. Technically difficult study. Patient with                 Parkinson's disease is unable to follow instructions.  Sonographer:    Wenda Low Referring Phys: [phone] Jackie Littlejohn  Sonographer Comments: Technically difficult study due to poor echo windows. Image acquisition challenging due to respiratory motion. IMPRESSIONS  1. Left ventricular ejection fraction, by estimation, is 70 to 75%. The left ventricle has hyperdynamic function. The left ventricle has no regional wall motion  abnormalities. Left ventricular diastolic parameters are consistent with Grade I diastolic dysfunction (impaired relaxation).  2. Right ventricular systolic function is normal. The right ventricular size is normal. There is normal pulmonary artery systolic pressure. The estimated right ventricular systolic pressure is 85.6 mmHg.  3. The mitral valve is grossly normal. Trivial mitral valve regurgitation.  4. The aortic valve is calcified. Aortic valve regurgitation is not visualized. Aortic valve sclerosis/calcification is present, without any evidence of aortic stenosis. Aortic valve mean gradient measures 6.0 mmHg.  5. The inferior vena cava is normal in size with greater than 50% respiratory variability, suggesting right atrial pressure of 3 mmHg. Comparison(s): No prior Echocardiogram. Conclusion(s)/Recommendation(s): No evidence of valvular vegetations on this transthoracic echocardiogram. Consider a transesophageal echocardiogram to exclude infective endocarditis if clinically indicated. FINDINGS  Left Ventricle: Left ventricular ejection fraction, by estimation, is 70 to 75%. The left ventricle has hyperdynamic function. The left ventricle has no regional wall motion abnormalities. The left ventricular internal cavity size was normal in size. There is no left ventricular hypertrophy. Left ventricular diastolic parameters are consistent with Grade I diastolic dysfunction (impaired relaxation). Indeterminate filling pressures. Right Ventricle: The right ventricular size is normal. No increase in right ventricular wall thickness. Right ventricular systolic function is normal. There is normal pulmonary artery systolic pressure. The tricuspid regurgitant velocity is 2.66 m/s, and  with an assumed right atrial pressure of 3 mmHg, the estimated right ventricular systolic pressure is 31.4 mmHg. Left Atrium: Left atrial size was normal in size. Right Atrium: Right atrial size was normal in size. Pericardium: There is  no evidence of pericardial effusion. Mitral Valve: The mitral valve is grossly normal. Trivial mitral valve regurgitation. MV peak gradient, 3.1 mmHg. The mean mitral valve gradient is 1.0 mmHg. Tricuspid Valve: The tricuspid valve is grossly normal. Tricuspid valve regurgitation is mild. Aortic Valve: The aortic valve is calcified. Aortic valve regurgitation is not visualized. Aortic valve sclerosis/calcification is present, without any evidence of aortic stenosis. Aortic valve mean gradient measures 6.0 mmHg. Aortic valve peak gradient measures 10.8 mmHg. Aortic valve area, by VTI measures 2.18 cm. Pulmonic Valve: The pulmonic valve was normal in structure. Pulmonic valve regurgitation is not visualized. Aorta: The aortic root and  ascending aorta are structurally normal, with no evidence of dilitation. Venous: The inferior vena cava is normal in size with greater than 50% respiratory variability, suggesting right atrial pressure of 3 mmHg. IAS/Shunts: No atrial level shunt detected by color flow Doppler.  LEFT VENTRICLE PLAX 2D LVIDd:         4.90 cm   Diastology LVIDs:         2.70 cm   LV e' medial:    6.42 cm/s LV PW:         1.10 cm   LV E/e' medial:  10.7 LV IVS:        1.00 cm   LV e' lateral:   12.20 cm/s LVOT diam:     1.90 cm   LV E/e' lateral: 5.7 LV SV:         71 LV SV Index:   34 LVOT Area:     2.84 cm  LEFT ATRIUM           Index LA diam:      3.50 cm 1.68 cm/m LA Vol (A4C): 22.8 ml 10.93 ml/m  AORTIC VALVE AV Area (Vmax):    1.83 cm AV Area (Vmean):   1.73 cm AV Area (VTI):     2.18 cm AV Vmax:           164.00 cm/s AV Vmean:          119.000 cm/s AV VTI:            0.325 m AV Peak Grad:      10.8 mmHg AV Mean Grad:      6.0 mmHg LVOT Vmax:         106.00 cm/s LVOT Vmean:        72.700 cm/s LVOT VTI:          0.250 m LVOT/AV VTI ratio: 0.77  AORTA Ao Root diam: 3.40 cm MITRAL VALVE               TRICUSPID VALVE MV Area (PHT): 2.85 cm    TR Peak grad:   28.3 mmHg MV Area VTI:   3.35 cm    TR  Vmax:        266.00 cm/s MV Peak grad:  3.1 mmHg MV Mean grad:  1.0 mmHg    SHUNTS MV Vmax:       0.88 m/s    Systemic VTI:  0.25 m MV Vmean:      48.2 cm/s   Systemic Diam: 1.90 cm MV Decel Time: 266 msec MV E velocity: 69.00 cm/s MV A velocity: 56.10 cm/s MV E/A ratio:  1.23 Lyman Bishop MD Electronically signed by Lyman Bishop MD Signature Date/Time: 08/04/2021/4:53:01 PM    Final    [place] EKG SITE RITE  Result Date: 08/03/2021 If Site Rite image not attached, placement could not be confirmed due to current cardiac rhythm.  IR Perc Cholecystostomy  Result Date: 08/02/2021 INDICATION: 81 year old male referred for percutaneous cholecystostomy EXAM: CHOLECYSTOSTOMY MEDICATIONS: 500 mg levofloxacin; The antibiotic was administered within an appropriate time frame prior to the initiation of the procedure. ANESTHESIA/SEDATION: Moderate (conscious) sedation was employed during this procedure. A total of Versed 0.5 mg and Fentanyl 25 mcg was administered intravenously. Moderate Sedation Time: 11 minutes. The patient's level of consciousness and vital signs were monitored continuously by radiology nursing throughout the procedure under my direct supervision. FLUOROSCOPY TIME:  Fluoroscopy Time: 0 minutes 24 seconds (3 mGy). COMPLICATIONS: None PROCEDURE: Informed written consent was obtained  from the patient and the patient's family after a thorough discussion of the procedural risks, benefits and alternatives. All questions were addressed. Maximal Sterile Barrier Technique was utilized including caps, mask, sterile gowns, sterile gloves, sterile drape, hand hygiene and skin antiseptic. A timeout was performed prior to the initiation of the procedure. Ultrasound survey of the right upper quadrant was performed for planning purposes. Once the patient is prepped and draped in the usual sterile fashion, the skin and subcutaneous tissues overlying the gallbladder were generously infiltrated 1% lidocaine for local  anesthesia. A coaxial needle was advanced under ultrasound guidance through the skin subcutaneous tissues and a small segment of liver into the gallbladder lumen. With removal of the stylet, spontaneous dark bile drainage occurred. Using modified Seldinger technique, a 10 French drain was placed into the gallbladder fossa, with aspiration of the sample for the lab. Contrast injection confirmed position of the tube within the gallbladder lumen. Drainage catheter was attached to gravity drain with a suture retention placed. Patient tolerated the procedure well and remained hemodynamically stable throughout. No complications were encountered and no significant blood loss encountered. IMPRESSION: Status post percutaneous cholecystostomy Signed, Dulcy Fanny. Nadene Rubins, RPVI [facility] Electronically Signed   By: Corrie Mckusick D.O.   On: 08/02/2021 14:33   NM Hepatobiliary Liver Func  Result Date: 08/01/2021 CLINICAL DATA:  Concern for cholecystitis. EXAM: NUCLEAR MEDICINE HEPATOBILIARY IMAGING TECHNIQUE: Sequential images of the abdomen were obtained out to 60 minutes following intravenous administration of radiopharmaceutical. 3 mm IV morphine administered to augment filling of the gallbladder. RADIOPHARMACEUTICALS:  5.5 mCi Tc-31m Choletec IV COMPARISON:  MRI 07/29/2021 FINDINGS: Uniform accumulation radiotracer within the liver. Counts are evident within the small bowel by 20 minutes. The gallbladder fails to fill at 60 minutes. IV morphine was administered to augment filling of the gallbladder. Post morphine injection, the gallbladder fails to fill. IMPRESSION: 1. Non filling of the gallbladder is consistent with obstruction of cystic duct / acute cholecystitis. 2. Patent common bile duct. These results will be called to the ordering clinician or representative by the Radiologist Assistant, and communication documented in the PACS or [employer] Electronically Signed   By: SSuzy BouchardM.D.   On: 08/01/2021 11:52   CT HEAD WO CONTRAST (5MM)  Result Date: 07/31/2021 CLINICAL DATA:  Altered mental status EXAM: CT HEAD WITHOUT CONTRAST TECHNIQUE: Contiguous axial images were obtained from the base of the skull through the vertex without intravenous contrast. RADIATION DOSE REDUCTION: This exam was performed according to the departmental dose-optimization program which includes automated exposure control, adjustment of the mA and/or kV according to patient size and/or use of iterative reconstruction technique. COMPARISON:  03/19/2021 FINDINGS: Brain: No acute intracranial findings are seen. There are no signs of bleeding within the cranium. Cortical sulci are prominent more so in the left cerebral hemisphere. There is no focal effacement of cortical sulci. Vascular: Unremarkable. Skull: Unremarkable. Sinuses/Orbits: There is mucosal thickening in the ethmoid sinus. Other: None. IMPRESSION: No acute intracranial findings are seen in noncontrast CT brain. Atrophy. Electronically Signed   By: PElmer PickerM.D.   On: 07/31/2021 14:40   DG Chest Port 1 View  Addendum Date: 07/30/2021   ADDENDUM REPORT: 07/30/2021 14:27 ADDENDUM: Voice recognition error in the impression. For the first sentence, the NG tube should be described as malpositioned, not well positioned. NG tube curls in the neck and does not extend into the thorax and will need to  be replaced/repositioned. Electronically Signed   By: Lajean Manes M.D.   On: 07/30/2021 14:27   Result Date: 07/30/2021 CLINICAL DATA:  NG tube placement. EXAM: PORTABLE CHEST 1 VIEW COMPARISON:  07/29/2021. FINDINGS: NG tube is partly visualized, curled in the neck, presumably in the oropharynx. Cardiac silhouette normal in size. No mediastinal or hilar masses. Lungs demonstrate prominent bronchovascular markings, otherwise clear. No pleural effusion or pneumothorax. IMPRESSION: 1. NG tube well  positioned, curled in the neck, presumably in the oropharynx. 2. No acute cardiopulmonary disease. Electronically Signed: By: Lajean Manes M.D. On: 07/30/2021 13:46   DG Abd 1 View  Result Date: 07/30/2021 CLINICAL DATA:  Gastric distension EXAM: ABDOMEN - 1 VIEW COMPARISON:  None Available. FINDINGS: The bowel gas pattern is nonobstructive. Mild gaseous distension of stomach and bowel. There are no abnormal calcifications overlying the kidneys. Pelvic phleboliths. Chronic right anterolateral tenth eleventh rib injuries. Degenerative changes of the spine with levoconvex lumbar curvature. IMPRESSION: No evidence of bowel obstruction. Electronically Signed   By: Maurine Simmering M.D.   On: 07/30/2021 11:33   MR ABDOMEN MRCP WO CONTRAST  Result Date: 07/29/2021 CLINICAL DATA:  Upper abdominal pain. Dilated common bile duct on ultrasound. EXAM: MRI ABDOMEN WITHOUT CONTRAST  (INCLUDING MRCP) TECHNIQUE: Multiplanar multisequence MR imaging of the abdomen was performed. Heavily T2-weighted images of the biliary and pancreatic ducts were obtained, and three-dimensional MRCP images were rendered by post processing. COMPARISON:  Ultrasound 07/29/2021 FINDINGS: Motion degradation exam. ICU patient. ICU patients have difficulty following breath holding commands. Lower chest:  Small bilateral pleural effusions Hepatobiliary: No intrahepatic biliary duct dilatation. Common bile duct normal caliber. The no gallstones identified. Small amount sludge within the gallbladder. No gallbladder distension or inflammation. Pancreas: Normal pancreatic parenchymal intensity. No ductal dilatation or inflammation. Spleen: Normal spleen. Adrenals/urinary tract: Adrenal glands and kidneys are normal. Stomach/Bowel: Stomach and limited of the small bowel is unremarkable Vascular/Lymphatic: Abdominal aortic normal caliber. No retroperitoneal periportal lymphadenopathy. Musculoskeletal: No aggressive osseous lesion IMPRESSION: 1. Normal common  bile duct. No intrahepatic biliary duct dilatation. 2. Small amount gallbladder sludge.  No evidence of cholecystitis. 3. Normal pancreas. 4. Bibasilar atelectasis. 5. Imaging degraded by respiratory motion. Electronically Signed   By: Suzy Bouchard M.D.   On: 07/29/2021 19:59   MR 3D Recon At Scanner  Result Date: 07/29/2021 CLINICAL DATA:  Upper abdominal pain. Dilated common bile duct on ultrasound. EXAM: MRI ABDOMEN WITHOUT CONTRAST  (INCLUDING MRCP) TECHNIQUE: Multiplanar multisequence MR imaging of the abdomen was performed. Heavily T2-weighted images of the biliary and pancreatic ducts were obtained, and three-dimensional MRCP images were rendered by post processing. COMPARISON:  Ultrasound 07/29/2021 FINDINGS: Motion degradation exam. ICU patient. ICU patients have difficulty following breath holding commands. Lower chest:  Small bilateral pleural effusions Hepatobiliary: No intrahepatic biliary duct dilatation. Common bile duct normal caliber. The no gallstones identified. Small amount sludge within the gallbladder. No gallbladder distension or inflammation. Pancreas: Normal pancreatic parenchymal intensity. No ductal dilatation or inflammation. Spleen: Normal spleen. Adrenals/urinary tract: Adrenal glands and kidneys are normal. Stomach/Bowel: Stomach and limited of the small bowel is unremarkable Vascular/Lymphatic: Abdominal aortic normal caliber. No retroperitoneal periportal lymphadenopathy. Musculoskeletal: No aggressive osseous lesion IMPRESSION: 1. Normal common bile duct. No intrahepatic biliary duct dilatation. 2. Small amount gallbladder sludge.  No evidence of cholecystitis. 3. Normal pancreas. 4. Bibasilar atelectasis. 5. Imaging degraded by respiratory motion. Electronically Signed   By: Suzy Bouchard M.D.   On: 07/29/2021 19:59   DG CHEST PORT 1  VIEW  Result Date: 07/29/2021 CLINICAL DATA:  Shortness of breath.  Chest pain.  Diaphoresis. EXAM: PORTABLE CHEST 1 VIEW COMPARISON:   07/29/2021 FINDINGS: Low lung volumes are present, causing crowding of the pulmonary vasculature. There is likely mild subsegmental atelectasis along both hemidiaphragms. Mild cardiomegaly. Prominence of the right upper mediastinal margin, cannot exclude adenopathy or nodule. IMPRESSION: 1. Low lung volumes and suspected mild atelectasis at the lung bases. 2. Somewhat nodular prominence the right upper mediastinum, cannot exclude adenopathy or nodule although this might possibly be from tortuous vasculature. Chest CT could be utilized for further characterization. 3. Mild cardiomegaly. Electronically Signed   By: Van Clines M.D.   On: 07/29/2021 10:05   US Abdomen Limited RUQ (LIVER/GB)  Result Date: 07/29/2021 CLINICAL DATA:  Abnormal LFTs EXAM: ULTRASOUND ABDOMEN LIMITED RIGHT UPPER QUADRANT COMPARISON:  CT abdomen and pelvis 07/28/2021 FINDINGS: Gallbladder: Moderately distended and contains echogenic calculi and sludge dependently. 5 mm echogenic likely polyp at the fundus. No significant wall thickening or pericholecystic fluid identified. Common bile duct: Diameter: 8 mm, upper normal for the patient's age. Liver: No focal lesion identified. Within normal limits in parenchymal echogenicity. Portal vein is patent on color Doppler imaging with normal direction of blood flow towards the liver. Other: None. IMPRESSION: 1. Cholelithiasis and sludge. 2. 5 mm likely polyp in the gallbladder. 3. Upper normal caliber of the common bile duct and moderate distention of the gallbladder. Correlate clinically and consider MRCP if indicated. Electronically Signed   By: Ofilia Neas M.D.   On: 07/29/2021 09:56   DG CHEST PORT 1 VIEW  Result Date: 07/29/2021 CLINICAL DATA:  Wheezing EXAM: PORTABLE CHEST 1 VIEW COMPARISON:  07/28/2021 FINDINGS: Limited low volume chest with generalized interstitial coarsening/crowding. No Kerley lines or pleural effusion. Normal heart size and mediastinal contours. Extensive  artifact from EKG leads IMPRESSION: Limited low volume chest that is stable from yesterday Electronically Signed   By: Jorje Guild M.D.   On: 07/29/2021 06:48   DG Wrist Complete Left  Result Date: 07/29/2021 CLINICAL DATA:  Wrist pain EXAM: LEFT WRIST - COMPLETE 3+ VIEW COMPARISON:  None Available. FINDINGS: No fracture or malalignment. Vascular calcifications. Advanced joint space narrowing of the radiocarpal joint. No erosions. IMPRESSION: 1. 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 2. No acute osseous abnormality Electronically Signed   By: Donavan Foil M.D.   On: 07/29/2021 00:09   CT ABDOMEN PELVIS WO CONTRAST  Result Date: 07/28/2021 CLINICAL DATA:  Abdominal pain EXAM: CT ABDOMEN AND PELVIS WITHOUT CONTRAST TECHNIQUE: Multidetector CT imaging of the abdomen and pelvis was performed following the standard protocol without IV contrast. RADIATION DOSE REDUCTION: This exam was performed according to the departmental dose-optimization program which includes automated exposure control, adjustment of the mA and/or kV according to patient size and/or use of iterative reconstruction technique. COMPARISON:  07/26/2021 FINDINGS: Lower chest: Small pleural effusions are noted bilaterally slightly increased when compared with the prior exam. Mild bibasilar atelectasis is seen. Hepatobiliary: Liver is within normal limits. Dependent gallstones are noted within the gallbladder which is well distended. Pneumobilia is again identified consistent with the given clinical history of sphincterotomy. Pancreas: Unremarkable. No pancreatic ductal dilatation or surrounding inflammatory changes. Spleen: Normal in size without focal abnormality. Adrenals/Urinary Tract: Adrenal glands are within normal limits bilaterally. Kidneys show no tiny nonobstructing renal stone on the right. No definitive stones are noted on the left. Hypodense lesion is noted within the left kidney measuring approximately 1.5 cm consistent with small  cysts  stable in appearance from the prior exam. No obstructive changes are seen. The bladder is well distended with mildly opacified urine. A portion of the bladder extends into a large right sided inguinal hernia. This is stable in appearance from the prior exam. Stomach/Bowel: Scattered fecal material is noted throughout the colon. Mild diverticular change is seen. The appendix has been surgically removed consistent with the given clinical history. Small bowel and stomach are unremarkable. Vascular/Lymphatic: Aortic atherosclerosis. No enlarged abdominal or pelvic lymph nodes. Reproductive: Prostate is unremarkable. Other: No ascites is noted. Bilateral fat containing inguinal hernias are seen. Persistent herniated bladder into the right hernia is noted. Musculoskeletal: Degenerative changes of lumbar spine are seen. IMPRESSION: Small nonobstructing right renal stone. Bilateral inguinal hernias with herniated bladder on the right stable from the prior study. Multiple small gallstones without complicating factors. No other focal abnormality is noted. Electronically Signed   By: Inez Catalina M.D.   On: 07/28/2021 23:58   DG Chest Port 1 View  Result Date: 07/28/2021 CLINICAL DATA:  Respiratory distress EXAM: PORTABLE CHEST 1 VIEW COMPARISON:  07/26/2021 FINDINGS: Hypoventilatory changes. No consolidation or effusion. Stable cardiomediastinal silhouette. No pneumothorax IMPRESSION: No active disease.  Low lung volumes. Electronically Signed   By: Donavan Foil M.D.   On: 07/28/2021 22:42   DG Chest Port 1 View  Result Date: 07/26/2021 CLINICAL DATA:  leukocytosis, back pain, poss PNA? EXAM: PORTABLE CHEST 1 VIEW COMPARISON:  Radiograph 08/01/2018 FINDINGS: Unchanged cardiomediastinal silhouette. Low lung volumes. No focal airspace disease. No pleural effusion. No pneumothorax. 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 IMPRESSION: Low lung volumes.  No focal airspace disease.  High-riding humeral heads bilaterally can be seen in distal rotator cuff tendinopathy. Electronically Signed   By: Maurine Simmering M.D.   On: 07/26/2021 14:09   CT Abdomen Pelvis W Contrast  Result Date: 07/26/2021 CLINICAL DATA:  Abdominal pain, acute, nonlocalized dementia, prior abd surgery, back? pain, diaphoresis, concern for aorta vs. nephrolith EXAM: CT ABDOMEN AND PELVIS WITH CONTRAST TECHNIQUE: Multidetector CT imaging of the abdomen and pelvis was performed using the standard protocol following bolus administration of intravenous contrast. RADIATION DOSE REDUCTION: This exam was performed according to the departmental dose-optimization program which includes automated exposure control, adjustment of the mA and/or kV according to patient size and/or use of iterative reconstruction technique. CONTRAST:  8m OMNIPAQUE IOHEXOL 300 MG/ML  SOLN COMPARISON:  CT 12/23/2015 FINDINGS: Lower chest: No acute abnormality. Hepatobiliary: No focal liver abnormality is seen. Mild gallbladder distension with layering sludge or tiny stones. There is pneumobilia consistent with history of prior ERCP and sphincterotomy. Pancreas: Unremarkable. No pancreatic ductal dilatation or surrounding inflammatory changes. Spleen: Normal in size without focal abnormality. Adrenals/Urinary Tract: Adrenal glands are unremarkable. No hydronephrosis or nephrolithiasis. There are nonobstructive bilateral renal stones. Unchanged bilateral renal cysts. There is a right inguinal hernia containing herniated bladder. Stomach/Bowel: The stomach is within normal limits. There is no evidence of bowel obstruction.Prior appendectomy. Scattered colonic diverticula. No diverticulitis. Moderate rectal stool burden. Vascular/Lymphatic: Aortoiliac atherosclerosis. No AAA. No lymphadenopathy. Reproductive: Unremarkable. Other: Right inguinal hernia containing hernia bladder. Fat containing left inguinal hernia. Unchanged anterior abdominal wall scarring.  Musculoskeletal: Levoconvex upper lumbar and dextroconvex lower lumbar curvatures. 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 There is severe multilevel facet arthropathy. Mild bilateral hip osteoarthritis. IMPRESSION: Nonobstructive 3 mm renal stones bilaterally. No hydronephrosis or ureterolithiasis. Right inguinal hernia containing distended herniated bladder. Mild gallbladder is tension with  layering sludge or small stones. No other findings to suggest cholecystitis by CT. 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 Aortoiliac atherosclerosis.  No AAA. Moderate rectal stool burden. Electronically Signed   By: Maurine Simmering M.D.   On: 07/26/2021 10:10    Orson Eva, DO  [facility]  If 7PM-7AM, please contact night-coverage [email or web address] Password [email or web address] 08/05/2021, 5:11 PM   LOS: 7 days

## 2021-08-05 NOTE — Progress Notes (Signed)
Noted that the pt still groans quite a bit when repositioned, moved, touched etc; This RN was able to arouse the pt enough to take his medication & did well with that crushed in orange sherbet

## 2021-08-05 NOTE — Care Management Important Message (Signed)
Important Message  Patient Details  Name: Elijah [name] MRN: [id number] Date of Birth: 1940-11-24   Medicare Important Message Given:  Yes     Tommy Medal 08/05/2021, 4:07 PM

## 2021-08-06 DIAGNOSIS — G9341 Metabolic encephalopathy: Secondary | ICD-10-CM | POA: Diagnosis not present

## 2021-08-06 DIAGNOSIS — K810 Acute cholecystitis: Secondary | ICD-10-CM | POA: Diagnosis not present

## 2021-08-06 DIAGNOSIS — N179 Acute kidney failure, unspecified: Secondary | ICD-10-CM | POA: Diagnosis not present

## 2021-08-06 DIAGNOSIS — A419 Sepsis, unspecified organism: Secondary | ICD-10-CM | POA: Diagnosis not present

## 2021-08-06 LAB — BASIC METABOLIC PANEL
Anion gap: 2 — ABNORMAL LOW (ref 5–15)
BUN: 34 mg/dL — ABNORMAL HIGH (ref 8–23)
CO2: 23 mmol/L (ref 22–32)
Calcium: 7.1 mg/dL — ABNORMAL LOW (ref 8.9–10.3)
Chloride: 120 mmol/L — ABNORMAL HIGH (ref 98–111)
Creatinine, Ser: 1.02 mg/dL (ref 0.61–1.24)
GFR, Estimated: >60 mL/min (ref >60)
Glucose, Bld: 263 mg/dL — ABNORMAL HIGH (ref 70–99)
Potassium: 4 mmol/L (ref 3.5–5.1)
Sodium: 145 mmol/L (ref 135–145)

## 2021-08-06 LAB — CBC
HCT: 27 % — ABNORMAL LOW (ref 39.0–52.0)
Hemoglobin: 8.6 g/dL — ABNORMAL LOW (ref 13.0–17.0)
MCH: 31.4 pg (ref 26.0–34.0)
MCHC: 31.9 g/dL (ref 30.0–36.0)
MCV: 98.5 fL (ref 80.0–100.0)
Platelets: 327 10*3/uL (ref 150–400)
RBC: 2.74 MIL/uL — ABNORMAL LOW (ref 4.22–5.81)
RDW: 15.6 % — ABNORMAL HIGH (ref 11.5–15.5)
WBC: 16.7 10*3/uL — ABNORMAL HIGH (ref 4.0–10.5)
nRBC: 0 % (ref 0.0–0.2)

## 2021-08-06 LAB — GLUCOSE, CAPILLARY
Glucose-Capillary: 215 mg/dL — ABNORMAL HIGH (ref 70–99)
Glucose-Capillary: 241 mg/dL — ABNORMAL HIGH (ref 70–99)
Glucose-Capillary: 265 mg/dL — ABNORMAL HIGH (ref 70–99)

## 2021-08-06 MED ORDER — HYDROCODONE-ACETAMINOPHEN 5-325 MG PO TABS
1.0000 | ORAL_TABLET | Freq: Four times a day (QID) | ORAL | Status: DC | PRN
  Administered 2021-08-06 – 2021-08-10 (×2): 1 via ORAL
  Filled 2021-08-06 (×2): qty 1

## 2021-08-06 MED ORDER — HEPARIN SODIUM (PORCINE) 5000 UNIT/ML IJ SOLN
5000.0000 [IU] | Freq: Three times a day (TID) | INTRAMUSCULAR | Status: DC
  Administered 2021-08-06 – 2021-08-11 (×15): 5000 [IU] via SUBCUTANEOUS
  Filled 2021-08-06 (×15): qty 1

## 2021-08-06 NOTE — Plan of Care (Signed)

## 2021-08-06 NOTE — Progress Notes (Signed)
PROGRESS NOTE  Elijah [name] INO:[id number] DOB: 1940/04/06 DOA: 07/28/2021 PCP: Celene Squibb, MD  Brief History:  As per H&P written by Dr.Zierle-Ghosh On 07/29/2021 Elijah [name] is a 81 y.o. male with medical history significant of history of chronic low back pain, diabetes mellitus type 2, hyperlipidemia, hypertension, Parkinson disease, obstructive sleep apnea, history of biliary obstruction and pancreatitis with ERCP in 2018, presents ED with chief complaint of dyspnea.  At time of presentation wife was at bedside.  She reported to the ED provider that patient has had 24 hours of shortness of breath and increased work of breathing.  He was recently seen in the ED 2 days ago for back pain, which was acute on chronic.  They had denied fevers at home, but wife did report the patient had left arm pain when she was trying to lift him up.  ED provider note reports that patient denied current abdominal pain.  Patient is not answering questions for me, but does have voluntary guarding with palpation of his abdomen.  Patient arrived in the ER satting in the low 90s on 4 L nasal cannula.  He was put on nonrebreather for work of breathing.  Sepsis protocol was started.  Patient was treated with vancomycin, cefepime, Flagyl, and given a 3 L bolus.  Lactic acid was significantly elevated 6.3, repeat 4.6.  Chest x-ray was negative for acute disease, CT abdomen pelvis did not given definitive explanation of his symptoms either.  Urine was borderline.  Patient did have an AKI.  Admission was requested for further work-up of altered mental status, dyspnea, and sepsis.   Patient was found to have GBS bacteremia with source as acute cholecystitis.  He was not a surgical candidate.  IR placed cholecystotomy tube on 6/13.  His IV antibiotics were continued.  Echo (TTE) was neg for vegetations.  His IVF was adjusted for his hypernatremia and his hydromorphone dosing was decreased with some improvement of his  encephalopathy.   Assessment and Plan: * Severe sepsis (HCC) -Heart rate 100, respiratory rate 30-44, lactic acidosis 6.3, AKI 2.24 -Bacteremia, acute cholecystitis and aspiration pneumonitis -Continue IV Rocephin -WBC's trending down after chole tube -Off BPAP and with good saturation demonstrated on 4-5 L [place] supplementation with great saturation. -COVID and flu are negative  Bacteremia due to group B Streptococcus 6/15 Repeat blood culture--GPC 1 of 2 sets Echo--EF 70-75%, no WMA, G1DD, no vegetation, trivial MR, mild TR Source - Gallbladder Repeat blood cultures negative   Acute cholecystitis -as mentioned, patient found not a candidate for surgery - IR placed cholecystostomy tube 08/02/21 -Continue supportive care and IV antibiotics.  AKI (acute kidney injury) ([place]) -Creatinine normally in the 0.6-0.8 range at baseline; at time of admission 2.24 and peaked at 2.72; now down to 1.36.>>1.22>>1.02 -due to volume depletion and sepsis/hemodynamic changes -Continue IVF  Thrombocytopenia (HCC) -due to sepsis -Platelets count nadir 70sK -improving with sepsis tx -restart heparin [place]  Acute respiratory failure with hypoxia ([place]) -Patient with good saturation on 1-2 L. -Patient had increasing work of breathing to the point of requiring transient use of BiPAP at time of admission. -Continue current IV antibiotics -now stable on RA  Hypernatremia - Na up to 158 -chenge to D5W>>increase to 150 cc/hr>>back down to 096/GE  Acute metabolic encephalopathy -Baseline>>pleasant confusion with hallucinations -Patient with underlying history of mood disorder/dementia and Parkinson. -Continue to be lethargic--but slowly improving -6/11 CT head--neg -ammonia = 41>>22 -  6/11 ABG 7.45/32/77/22 on 2L -Palliative care consulted. -remains encephalopathic on 6/17, but more awake and occasionally follows commands -d/c hydromorphone>>>start norco  HLD (hyperlipidemia) -initially held due to  poor mentation -Holding statin for now.  Lactic acidosis -Secondary to sepsis -Initial lactic acid 6.3, repeat 4.6>> last one 1.1 -Continue to maintain adequate IV hydration  Total bilirubin, elevated -history of biliary obstruction and ERCP back in 2018 -CT abd--bilateral inguinal hernias, herniated bladder on the right, small nonobstructing stone, and gallstones without complication -MRCP--no retained CBD stones or any masses in his liver. -LFTs has continued trending down overall. -Continue IV antibiotics -After discussing with general surgery>>check HIDA scan; which demonstrated positive result for acute cholecystitis and cystic duct obstruction. -not a candidate for cholecystectomy/surgical intervention. -6/13--cholecystotomy tube  Essential hypertension -Continue holding ARB and  HCTZ due to AKI -Blood pressure has now stabilized -continue  low-dose Lopressor IV. -BP and HR now controlled.   Diabetes mellitus without complication ([place]) -holding metformin -monitor glucose with CBGs and if it starts trending up will add insulin coverage. -so far CBG's are stable. -6/16 A1C--6.7  Parkinson disease ([place]) -pt on Sinemet when able to take by mouth, but poor mentation precludes po intake initially -Patient with underlying mood disorder and mild hallucinations/psychosis as per patient's wife reports>>>worse over past 6-12 months at baseline -Unable to place NG tube on 07/30/21 (very traumatic and never able to passed from his neck). -now more awake -speech eval>>dys 2 with thin      Family Communication:   spouse updated 6/17   Consultants:  palliative   Code Status:  FULL   DVT Prophylaxis:  SCDs     Procedures: As Listed in Progress Note Above   Antibiotics: Ceftriaxone 6/11>> Cefepime 6/8>>6/11         Subjective: Patient denies  headache, chest pain, dyspnea, vomiting, diarrhea, abdominal pain, dysuria   Objective: Vitals:   08/06/21 1100 08/06/21  1122 08/06/21 1200 08/06/21 1300  BP: (!) 128/38  (!) 127/39 (!) 125/52  Pulse: 80     Resp: (!) 21 19 (!) 24 (!) 24  Temp:  99.3 F (37.4 C)    TempSrc:  Axillary    SpO2: 100%     Weight:      Height:        Intake/Output Summary (Last 24 hours) at 08/06/2021 1456 Last data filed at 08/06/2021 1300 Gross per 24 hour  Intake 5572.68 ml  Output 1975 ml  Net 3597.68 ml   Weight change: 3.3 kg Exam:  General:  Pt is alert, follows commands appropriately, not in acute distress HEENT: No icterus, No thrush, No neck mass, [place]/AT Cardiovascular: RRR, S1/S2, no rubs, no gallops Respiratory: bibasilar crackles. No wheeze Abdomen: Soft/+BS, RUQ tender, non distended, no guarding Extremities: trace LE edema, No lymphangitis, No petechiae, No rashes, no synovitis   Data Reviewed: I have personally reviewed following labs and imaging studies Basic Metabolic Panel: Recent Labs  Lab 08/03/21 0353 08/04/21 0406 08/04/21 0616 08/05/21 0327 08/06/21 0353  NA 158* 151* 159* 154* 145  K 4.0 3.7 4.3 4.1 4.0  CL >130* 127* >130* 128* 120*  CO2 21* '22 23 23 23  '$ GLUCOSE 182* 571* 191* 203* 263*  BUN 67* 54* 60* 52* 34*  CREATININE 1.37* 1.25* 1.36* 1.22 1.02  CALCIUM 8.0* 6.9* 7.8* 7.5* 7.1*  MG  --  2.2  --  2.3  --    Liver Function Tests: Recent Labs  Lab 07/31/21 0330 08/01/21 1434 08/03/21 0353  AST 52* 32 23  ALT '12 28 22  '$ ALKPHOS 137* 169* 131*  BILITOT 4.4* 3.1* 2.1*  PROT 5.2* 5.2* 5.7*  ALBUMIN 2.1* 2.0* 1.9*   No results for input(s): "LIPASE", "AMYLASE" in the last 168 hours. Recent Labs  Lab 07/31/21 1423 08/05/21 0327  AMMONIA 41* 22   Coagulation Profile: No results for input(s): "INR", "PROTIME" in the last 168 hours. CBC: Recent Labs  Lab 07/31/21 0330 08/01/21 0222 08/03/21 0353 08/04/21 0406 08/05/21 0327 08/06/21 0353  WBC 12.8* 16.7* 19.6* 14.9* 16.5* 16.7*  NEUTROABS 10.5*  --   --   --   --   --   HGB 10.3* 11.7* 11.1* 9.0* 9.4* 8.6*   HCT 29.6* 34.2* 34.7* 28.7* 30.5* 27.0*  MCV 91.9 92.2 98.3 101.1* 100.7* 98.5  PLT 72* 92* 177 191 270 327   Cardiac Enzymes: No results for input(s): "CKTOTAL", "CKMB", "CKMBINDEX", "TROPONINI" in the last 168 hours. BNP: Invalid input(s): "POCBNP" CBG: Recent Labs  Lab 08/05/21 0630 08/05/21 1239 08/06/21 0029 08/06/21 0542 08/06/21 1121  GLUCAP 154* 177* 215* 241* 265*   HbA1C: Recent Labs    08/05/21 0327  HGBA1C 6.7*   Urine analysis:    Component Value Date/Time   COLORURINE AMBER (A) 07/29/2021 1000   APPEARANCEUR CLOUDY (A) 07/29/2021 1000   LABSPEC 1.023 07/29/2021 1000   PHURINE 5.0 07/29/2021 1000   GLUCOSEU 50 (A) 07/29/2021 1000   HGBUR SMALL (A) 07/29/2021 1000   BILIRUBINUR NEGATIVE 07/29/2021 1000   KETONESUR 5 (A) 07/29/2021 1000   PROTEINUR 30 (A) 07/29/2021 1000   UROBILINOGEN 0.2 10/14/2009 1851   NITRITE NEGATIVE 07/29/2021 1000   LEUKOCYTESUR NEGATIVE 07/29/2021 1000   Sepsis Labs: '@LABRCNTIP'$ (procalcitonin:4,lacticidven:4) ) Recent Results (from the past 240 hour(s))  Culture, blood (Routine x 2)     Status: Abnormal   Collection Time: 07/28/21 10:33 PM   Specimen: BLOOD LEFT WRIST  Result Value Ref Range Status   Specimen Description   Final    BLOOD LEFT WRIST Performed at [facility], [address], [place], [place] [address]    Special Requests   Final    BOTTLES DRAWN AEROBIC AND ANAEROBIC Blood Culture adequate volume Performed at [facility], [address], [place], [place] [address]    Culture  Setup Time   Final    GRAM POSITIVE COCCI IN BOTH AEROBIC AND ANAEROBIC BOTTLES Gram Stain Report Called to,Read Back By and Verified With: [place] @ 9201 ON [id number] BY HENDERSON L CRITICAL VALUE NOTED.  VALUE IS CONSISTENT WITH PREVIOUSLY REPORTED AND CALLED VALUE.    Culture (A)  Final    GROUP B STREP(S.AGALACTIAE)ISOLATED SUSCEPTIBILITIES PERFORMED ON PREVIOUS CULTURE WITHIN THE LAST 5 DAYS. Performed at [place], [place] [address] [place], [place] [address]    Report Status 07/31/2021 FINAL  Final  Culture, blood (Routine x 2)     Status: Abnormal   Collection Time: 07/28/21 10:33 PM   Specimen: BLOOD RIGHT HAND  Result Value Ref Range Status   Specimen Description   Final    BLOOD RIGHT HAND Performed at [facility], [address] [place], [place] [address]    Special Requests   Final    BOTTLES DRAWN AEROBIC AND ANAEROBIC Blood Culture results may not be optimal due to an excessive volume of blood received in culture bottles Performed at [facility], [address], [place],  [address]    Culture  Setup Time   Final  GRAM POSITIVE COCCI IN BOTH AEROBIC AND ANAEROBIC BOTTLES Gram Stain Report Called to,Read Back By and Verified With: CHILDRESS @ 0845 ON [id number] BY HENDERSON L CRITICAL RESULT CALLED TO, READ BACK BY AND VERIFIED WITH: Estelle June [phone] FCP Performed at [facility], [place] [address], [place],  [address]    Culture GROUP B STREP(S.AGALACTIAE)ISOLATED (A)  Final   Report Status 07/31/2021 FINAL  Final   Organism ID, Bacteria GROUP B STREP(S.AGALACTIAE)ISOLATED  Final      Susceptibility   Group b strep(s.agalactiae)isolated - MIC*    CLINDAMYCIN <=0.25 SENSITIVE Sensitive     AMPICILLIN <=0.25 SENSITIVE Sensitive     ERYTHROMYCIN <=0.12 SENSITIVE Sensitive     VANCOMYCIN 0.5 SENSITIVE Sensitive     CEFTRIAXONE <=0.12 SENSITIVE Sensitive     LEVOFLOXACIN 1 SENSITIVE Sensitive     * GROUP B STREP(S.AGALACTIAE)ISOLATED  Blood Culture ID Panel (Reflexed)     Status: Abnormal   Collection Time: 07/28/21 10:33 PM  Result Value Ref Range Status   Enterococcus faecalis NOT DETECTED NOT DETECTED Final   Enterococcus Faecium NOT DETECTED NOT DETECTED Final   Listeria monocytogenes NOT DETECTED NOT DETECTED Final   Staphylococcus species NOT DETECTED NOT DETECTED Final   Staphylococcus aureus (BCID) NOT DETECTED NOT DETECTED Final   Staphylococcus  epidermidis NOT DETECTED NOT DETECTED Final   Staphylococcus lugdunensis NOT DETECTED NOT DETECTED Final   Streptococcus species DETECTED (A) NOT DETECTED Final    Comment: CRITICAL RESULT CALLED TO, READ BACK BY AND VERIFIED WITH: PHARMD STEVEN H [phone] FCP    Streptococcus agalactiae DETECTED (A) NOT DETECTED Final    Comment: CRITICAL RESULT CALLED TO, READ BACK BY AND VERIFIED WITH: PHARMD STEVEN H [phone] FCP    Streptococcus pneumoniae NOT DETECTED NOT DETECTED Final   Streptococcus pyogenes NOT DETECTED NOT DETECTED Final   A.calcoaceticus-baumannii NOT DETECTED NOT DETECTED Final   Bacteroides fragilis NOT DETECTED NOT DETECTED Final   Enterobacterales NOT DETECTED NOT DETECTED Final   Enterobacter cloacae complex NOT DETECTED NOT DETECTED Final   Escherichia coli NOT DETECTED NOT DETECTED Final   Klebsiella aerogenes NOT DETECTED NOT DETECTED Final   Klebsiella oxytoca NOT DETECTED NOT DETECTED Final   Klebsiella pneumoniae NOT DETECTED NOT DETECTED Final   Proteus species NOT DETECTED NOT DETECTED Final   Salmonella species NOT DETECTED NOT DETECTED Final   Serratia marcescens NOT DETECTED NOT DETECTED Final   Haemophilus influenzae NOT DETECTED NOT DETECTED Final   Neisseria meningitidis NOT DETECTED NOT DETECTED Final   Pseudomonas aeruginosa NOT DETECTED NOT DETECTED Final   Stenotrophomonas maltophilia NOT DETECTED NOT DETECTED Final   Candida albicans NOT DETECTED NOT DETECTED Final   Candida auris NOT DETECTED NOT DETECTED Final   Candida glabrata NOT DETECTED NOT DETECTED Final   Candida krusei NOT DETECTED NOT DETECTED Final   Candida parapsilosis NOT DETECTED NOT DETECTED Final   Candida tropicalis NOT DETECTED NOT DETECTED Final   Cryptococcus neoformans/gattii NOT DETECTED NOT DETECTED Final    Comment: Performed at [facility], [place]. [address], [place], [place] [address]  Resp Panel by RT-PCR (Flu A&B, Covid) Anterior Nasal Swab     Status: None    Collection Time: 07/28/21 10:51 PM   Specimen: Anterior Nasal Swab  Result Value Ref Range Status   SARS Coronavirus 2 by RT PCR NEGATIVE NEGATIVE Final    Comment: (NOTE) SARS-CoV-2 target nucleic acids are NOT DETECTED.  The SARS-CoV-2 RNA is generally  detectable in upper respiratory specimens during the acute phase of infection. The lowest concentration of SARS-CoV-2 viral copies this assay can detect is 138 copies/mL. A negative result does not preclude SARS-Cov-2 infection and should not be used as the sole basis for treatment or other patient management decisions. A negative result may occur with  improper specimen collection/handling, submission of specimen other than nasopharyngeal swab, presence of viral mutation(s) within the areas targeted by this assay, and inadequate number of viral copies(<138 copies/mL). A negative result must be combined with clinical observations, patient history, and epidemiological information. The expected result is Negative.  Fact Sheet for Patients:  EntrepreneurPulse.com.au  Fact Sheet for Healthcare Providers:  IncredibleEmployment.be  This test is no t yet approved or cleared by the [place] FDA and  has been authorized for detection and/or diagnosis of SARS-CoV-2 by FDA under an Emergency Use Authorization (EUA). This EUA will remain  in effect (meaning this test can be used) for the duration of the COVID-19 declaration under Section 564(b)(1) of the Act, 21 U.S.C.section 360bbb-3(b)(1), unless the authorization is terminated  or revoked sooner.       Influenza A by PCR NEGATIVE NEGATIVE Final   Influenza B by PCR NEGATIVE NEGATIVE Final    Comment: (NOTE) The Xpert Xpress SARS-CoV-2/FLU/RSV plus assay is intended as an aid in the diagnosis of influenza from Nasopharyngeal swab specimens and should not be used as a sole basis for treatment. Nasal washings and aspirates are unacceptable for  Xpert Xpress SARS-CoV-2/FLU/RSV testing.  Fact Sheet for Patients: EntrepreneurPulse.com.au  Fact Sheet for Healthcare Providers: IncredibleEmployment.be  This test is not yet approved or cleared by the [place] FDA and has been authorized for detection and/or diagnosis of SARS-CoV-2 by FDA under an Emergency Use Authorization (EUA). This EUA will remain in effect (meaning this test can be used) for the duration of the COVID-19 declaration under Section 564(b)(1) of the Act, 21 U.S.C. section 360bbb-3(b)(1), unless the authorization is terminated or revoked.  Performed at [facility], [address] [place], [place] [address]   Urine Culture     Status: None   Collection Time: 07/29/21  2:19 AM   Specimen: Urine, Clean Catch  Result Value Ref Range Status   Specimen Description   Final    URINE, CLEAN CATCH Performed at [facility], [address], [place], [place] [address]    Special Requests   Final    NONE Performed at [facility], [address] [place], [place] [address]    Culture   Final    NO GROWTH Performed at [facility], [place] [address], [place], [place] [address]    Report Status 07/31/2021 FINAL  Final  MRSA Next Gen by PCR, Nasal     Status: None   Collection Time: 07/29/21 10:27 AM   Specimen: Nasal Mucosa; Nasal Swab  Result Value Ref Range Status   MRSA by PCR Next Gen NOT DETECTED NOT DETECTED Final    Comment: (NOTE) The GeneXpert MRSA Assay (FDA approved for NASAL specimens only), is one component of a comprehensive MRSA colonization surveillance program. It is not intended to diagnose MRSA infection nor to guide or monitor treatment for MRSA infections. Test performance is not FDA approved in patients less than 13 years old. Performed at [facility], [address], [place],  [address]   Aerobic/Anaerobic Culture w Gram Stain (surgical/deep wound)     Status: Abnormal (Preliminary result)    Collection Time: 08/02/21  2:25  PM   Specimen: Gallbladder; Bile  Result Value Ref Range Status   Specimen Description   Final    GALL BLADDER Performed at [facility], [address] [place], [place] [address]    Special Requests   Final    NONE Performed at [facility], [address] [place], [place] [address]    Gram Stain   Final    NO WBC SEEN FEW GRAM POSITIVE COCCI IN PAIRS FEW GRAM NEGATIVE RODS MODERATE BRANCHING, BEADING  GRAM POSITIVE RODS Performed at [facility], [place] [address], [place], [place] [address]    Culture (A)  Final    MULTIPLE ORGANISMS PRESENT, NONE PREDOMINANT NO GROUP A STREP (S.PYOGENES) ISOLATED NO STAPHYLOCOCCUS AUREUS ISOLATED NO ANAEROBES ISOLATED; CULTURE IN PROGRESS FOR 5 DAYS    Report Status PENDING  Incomplete  Culture, blood (Routine X 2) w Reflex to ID Panel     Status: Abnormal (Preliminary result)   Collection Time: 08/04/21  4:06 AM   Specimen: Right Antecubital; Blood  Result Value Ref Range Status   Specimen Description   Final    RIGHT ANTECUBITAL Performed at [facility], [address], [place], [place] [address]    Special Requests   Final    BOTTLES DRAWN AEROBIC AND ANAEROBIC Blood Culture adequate volume Performed at [facility], [address], [place], [place] [address]    Culture  Setup Time   Final    GRAM POSITIVE COCCI ANAEROBIC BOTTLE ONLY Gram Stain Report Called to,Read Back By and Verified With: MCGIBBONY @ 3419 ON [id number] BY HENDERSON L CRITICAL RESULT CALLED TO, READ BACK BY AND VERIFIED WITH: RN Ralph Dowdy [phone] '@1928'$  FH    Culture (A)  Final    STAPHYLOCOCCUS EPIDERMIDIS THE SIGNIFICANCE OF ISOLATING THIS ORGANISM FROM A SINGLE SET OF BLOOD CULTURES WHEN MULTIPLE SETS ARE DRAWN IS UNCERTAIN. PLEASE NOTIFY THE MICROBIOLOGY DEPARTMENT WITHIN ONE WEEK IF SPECIATION AND SENSITIVITIES ARE REQUIRED. Performed at [facility], [place] [address], [place], [place] [address]    Report Status PENDING   Incomplete  Blood Culture ID Panel (Reflexed)     Status: Abnormal   Collection Time: 08/04/21  4:06 AM  Result Value Ref Range Status   Enterococcus faecalis NOT DETECTED NOT DETECTED Final   Enterococcus Faecium NOT DETECTED NOT DETECTED Final   Listeria monocytogenes NOT DETECTED NOT DETECTED Final   Staphylococcus species DETECTED (A) NOT DETECTED Final    Comment: CRITICAL RESULT CALLED TO, READ BACK BY AND VERIFIED WITH: RN Ralph Dowdy [phone] '@1928'$  FH    Staphylococcus aureus (BCID) NOT DETECTED NOT DETECTED Final   Staphylococcus epidermidis DETECTED (A) NOT DETECTED Final    Comment: Methicillin (oxacillin) resistant coagulase negative staphylococcus. Possible blood culture contaminant (unless isolated from more than one blood culture draw or clinical case suggests pathogenicity). No antibiotic treatment is indicated for blood  culture contaminants. CRITICAL RESULT CALLED TO, READ BACK BY AND VERIFIED WITH: RN Ralph Dowdy [phone] '@1928'$  FH    Staphylococcus lugdunensis NOT DETECTED NOT DETECTED Final   Streptococcus species NOT DETECTED NOT DETECTED Final   Streptococcus agalactiae NOT DETECTED NOT DETECTED Final   Streptococcus pneumoniae NOT DETECTED NOT DETECTED Final   Streptococcus pyogenes NOT DETECTED NOT DETECTED Final   A.calcoaceticus-baumannii NOT DETECTED NOT DETECTED Final   Bacteroides fragilis NOT DETECTED NOT DETECTED Final   Enterobacterales NOT DETECTED NOT DETECTED Final   Enterobacter cloacae complex NOT DETECTED NOT DETECTED Final   Escherichia coli NOT  DETECTED NOT DETECTED Final   Klebsiella aerogenes NOT DETECTED NOT DETECTED Final   Klebsiella oxytoca NOT DETECTED NOT DETECTED Final   Klebsiella pneumoniae NOT DETECTED NOT DETECTED Final   Proteus species NOT DETECTED NOT DETECTED Final   Salmonella species NOT DETECTED NOT DETECTED Final   Serratia marcescens NOT DETECTED NOT DETECTED Final   Haemophilus influenzae NOT DETECTED NOT DETECTED Final    Neisseria meningitidis NOT DETECTED NOT DETECTED Final   Pseudomonas aeruginosa NOT DETECTED NOT DETECTED Final   Stenotrophomonas maltophilia NOT DETECTED NOT DETECTED Final   Candida albicans NOT DETECTED NOT DETECTED Final   Candida auris NOT DETECTED NOT DETECTED Final   Candida glabrata NOT DETECTED NOT DETECTED Final   Candida krusei NOT DETECTED NOT DETECTED Final   Candida parapsilosis NOT DETECTED NOT DETECTED Final   Candida tropicalis NOT DETECTED NOT DETECTED Final   Cryptococcus neoformans/gattii NOT DETECTED NOT DETECTED Final   Methicillin resistance mecA/C DETECTED (A) NOT DETECTED Final    Comment: CRITICAL RESULT CALLED TO, READ BACK BY AND VERIFIED WITH: RN Ralph Dowdy [phone] '@1928'$  FH Performed at [facility], [address], [place], [place] [address]   Culture, blood (Routine X 2) w Reflex to ID Panel     Status: None (Preliminary result)   Collection Time: 08/04/21  6:15 AM   Specimen: BLOOD LEFT HAND  Result Value Ref Range Status   Specimen Description BLOOD LEFT HAND  Final   Special Requests   Final    BOTTLES DRAWN AEROBIC AND ANAEROBIC Blood Culture adequate volume   Culture   Final    NO GROWTH 2 DAYS Performed at [facility], [address], [place], [place] [address]    Report Status PENDING  Incomplete     Scheduled Meds:  aspirin  300 mg Rectal Daily   bisacodyl  10 mg Rectal Daily   budesonide (PULMICORT) nebulizer solution  0.5 mg Nebulization BID   Carbidopa-Levodopa ER  1 tablet Oral Q supper   Carbidopa-Levodopa ER  2 tablet Oral TID   Chlorhexidine Gluconate Cloth  6 each Topical Q0600   metoprolol tartrate  2.5 mg Intravenous Q12H   sodium chloride flush  10-40 mL Intracatheter Q12H   sodium chloride flush  5-10 mL Intracatheter Q8H   Continuous Infusions:  cefTRIAXone (ROCEPHIN)  IV 2 g (08/05/21 2030)   dextrose 100 mL/hr at 08/06/21 0948    Procedures/Studies: ECHOCARDIOGRAM COMPLETE  Result Date: 08/04/2021     ECHOCARDIOGRAM REPORT   Patient Name:   Elijah [name] Date of Exam: 08/04/2021 Medical Rec #:  [id number]       Height:       70.0 in Accession #:    [id number]      Weight:       199.7 lb Date of Birth:  [birth year]      BSA:          2.086 m Patient Age:    80 years        BP:           139/46 mmHg Patient Gender: M               HR:           81 bpm. Exam Location:  [facility] Procedure: 2D Echo, Cardiac Doppler and Color Doppler Indications:    Bacteremia  History:        Patient has no prior history of Echocardiogram examinations.  Signs/Symptoms:Bacteremia; Risk Factors:Hypertension, Diabetes                 and Dyslipidemia. Technically difficult study. Patient with                 Parkinson's disease is unable to follow instructions.  Sonographer:    Wenda Low Referring Phys: [phone] Veroncia Jezek  Sonographer Comments: Technically difficult study due to poor echo windows. Image acquisition challenging due to respiratory motion. IMPRESSIONS  1. Left ventricular ejection fraction, by estimation, is 70 to 75%. The left ventricle has hyperdynamic function. The left ventricle has no regional wall motion abnormalities. Left ventricular diastolic parameters are consistent with Grade I diastolic dysfunction (impaired relaxation).  2. Right ventricular systolic function is normal. The right ventricular size is normal. There is normal pulmonary artery systolic pressure. The estimated right ventricular systolic pressure is 50.3 mmHg.  3. The mitral valve is grossly normal. Trivial mitral valve regurgitation.  4. The aortic valve is calcified. Aortic valve regurgitation is not visualized. Aortic valve sclerosis/calcification is present, without any evidence of aortic stenosis. Aortic valve mean gradient measures 6.0 mmHg.  5. The inferior vena cava is normal in size with greater than 50% respiratory variability, suggesting right atrial pressure of 3 mmHg. Comparison(s): No prior Echocardiogram.  Conclusion(s)/Recommendation(s): No evidence of valvular vegetations on this transthoracic echocardiogram. Consider a transesophageal echocardiogram to exclude infective endocarditis if clinically indicated. FINDINGS  Left Ventricle: Left ventricular ejection fraction, by estimation, is 70 to 75%. The left ventricle has hyperdynamic function. The left ventricle has no regional wall motion abnormalities. The left ventricular internal cavity size was normal in size. There is no left ventricular hypertrophy. Left ventricular diastolic parameters are consistent with Grade I diastolic dysfunction (impaired relaxation). Indeterminate filling pressures. Right Ventricle: The right ventricular size is normal. No increase in right ventricular wall thickness. Right ventricular systolic function is normal. There is normal pulmonary artery systolic pressure. The tricuspid regurgitant velocity is 2.66 m/s, and  with an assumed right atrial pressure of 3 mmHg, the estimated right ventricular systolic pressure is 54.6 mmHg. Left Atrium: Left atrial size was normal in size. Right Atrium: Right atrial size was normal in size. Pericardium: There is no evidence of pericardial effusion. Mitral Valve: The mitral valve is grossly normal. Trivial mitral valve regurgitation. MV peak gradient, 3.1 mmHg. The mean mitral valve gradient is 1.0 mmHg. Tricuspid Valve: The tricuspid valve is grossly normal. Tricuspid valve regurgitation is mild. Aortic Valve: The aortic valve is calcified. Aortic valve regurgitation is not visualized. Aortic valve sclerosis/calcification is present, without any evidence of aortic stenosis. Aortic valve mean gradient measures 6.0 mmHg. Aortic valve peak gradient measures 10.8 mmHg. Aortic valve area, by VTI measures 2.18 cm. Pulmonic Valve: The pulmonic valve was normal in structure. Pulmonic valve regurgitation is not visualized. Aorta: The aortic root and ascending aorta are structurally normal, with no evidence  of dilitation. Venous: The inferior vena cava is normal in size with greater than 50% respiratory variability, suggesting right atrial pressure of 3 mmHg. IAS/Shunts: No atrial level shunt detected by color flow Doppler.  LEFT VENTRICLE PLAX 2D LVIDd:         4.90 cm   Diastology LVIDs:         2.70 cm   LV e' medial:    6.42 cm/s LV PW:         1.10 cm   LV E/e' medial:  10.7 LV IVS:  1.00 cm   LV e' lateral:   12.20 cm/s LVOT diam:     1.90 cm   LV E/e' lateral: 5.7 LV SV:         71 LV SV Index:   34 LVOT Area:     2.84 cm  LEFT ATRIUM           Index LA diam:      3.50 cm 1.68 cm/m LA Vol (A4C): 22.8 ml 10.93 ml/m  AORTIC VALVE AV Area (Vmax):    1.83 cm AV Area (Vmean):   1.73 cm AV Area (VTI):     2.18 cm AV Vmax:           164.00 cm/s AV Vmean:          119.000 cm/s AV VTI:            0.325 m AV Peak Grad:      10.8 mmHg AV Mean Grad:      6.0 mmHg LVOT Vmax:         106.00 cm/s LVOT Vmean:        72.700 cm/s LVOT VTI:          0.250 m LVOT/AV VTI ratio: 0.77  AORTA Ao Root diam: 3.40 cm MITRAL VALVE               TRICUSPID VALVE MV Area (PHT): 2.85 cm    TR Peak grad:   28.3 mmHg MV Area VTI:   3.35 cm    TR Vmax:        266.00 cm/s MV Peak grad:  3.1 mmHg MV Mean grad:  1.0 mmHg    SHUNTS MV Vmax:       0.88 m/s    Systemic VTI:  0.25 m MV Vmean:      48.2 cm/s   Systemic Diam: 1.90 cm MV Decel Time: 266 msec MV E velocity: 69.00 cm/s MV A velocity: 56.10 cm/s MV E/A ratio:  1.23 Lyman Bishop MD Electronically signed by Lyman Bishop MD Signature Date/Time: 08/04/2021/4:53:01 PM    Final    [place] EKG SITE RITE  Result Date: 08/03/2021 If Site Rite image not attached, placement could not be confirmed due to current cardiac rhythm.  IR Perc Cholecystostomy  Result Date: 08/02/2021 INDICATION: 81 year old male referred for percutaneous cholecystostomy EXAM: CHOLECYSTOSTOMY MEDICATIONS: 500 mg levofloxacin; The antibiotic was administered within an appropriate time frame prior to the  initiation of the procedure. ANESTHESIA/SEDATION: Moderate (conscious) sedation was employed during this procedure. A total of Versed 0.5 mg and Fentanyl 25 mcg was administered intravenously. Moderate Sedation Time: 11 minutes. The patient's level of consciousness and vital signs were monitored continuously by radiology nursing throughout the procedure under my direct supervision. FLUOROSCOPY TIME:  Fluoroscopy Time: 0 minutes 24 seconds (3 mGy). COMPLICATIONS: None PROCEDURE: Informed written consent was obtained from the patient and the patient's family after a thorough discussion of the procedural risks, benefits and alternatives. All questions were addressed. Maximal Sterile Barrier Technique was utilized including caps, mask, sterile gowns, sterile gloves, sterile drape, hand hygiene and skin antiseptic. A timeout was performed prior to the initiation of the procedure. Ultrasound survey of the right upper quadrant was performed for planning purposes. Once the patient is prepped and draped in the usual sterile fashion, the skin and subcutaneous tissues overlying the gallbladder were generously infiltrated 1% lidocaine for local anesthesia. A coaxial needle was advanced under ultrasound guidance through the skin subcutaneous tissues and a small segment  of liver into the gallbladder lumen. With removal of the stylet, spontaneous dark bile drainage occurred. Using modified Seldinger technique, a 10 French drain was placed into the gallbladder fossa, with aspiration of the sample for the lab. Contrast injection confirmed position of the tube within the gallbladder lumen. Drainage catheter was attached to gravity drain with a suture retention placed. Patient tolerated the procedure well and remained hemodynamically stable throughout. No complications were encountered and no significant blood loss encountered. IMPRESSION: Status post percutaneous cholecystostomy Signed, Dulcy Fanny. Nadene Rubins, RPVI [facility] Electronically Signed   By: Corrie Mckusick D.O.   On: 08/02/2021 14:33   NM Hepatobiliary Liver Func  Result Date: 08/01/2021 CLINICAL DATA:  Concern for cholecystitis. EXAM: NUCLEAR MEDICINE HEPATOBILIARY IMAGING TECHNIQUE: Sequential images of the abdomen were obtained out to 60 minutes following intravenous administration of radiopharmaceutical. 3 mm IV morphine administered to augment filling of the gallbladder. RADIOPHARMACEUTICALS:  5.5 mCi Tc-55m Choletec IV COMPARISON:  MRI 07/29/2021 FINDINGS: Uniform accumulation radiotracer within the liver. Counts are evident within the small bowel by 20 minutes. The gallbladder fails to fill at 60 minutes. IV morphine was administered to augment filling of the gallbladder. Post morphine injection, the gallbladder fails to fill. IMPRESSION: 1. Non filling of the gallbladder is consistent with obstruction of cystic duct / acute cholecystitis. 2. Patent common bile duct. These results will be called to the ordering clinician or representative by the Radiologist Assistant, and communication documented in the PACS or [employer] Electronically Signed   By: SSuzy BouchardM.D.   On: 08/01/2021 11:52   CT HEAD WO CONTRAST (5MM)  Result Date: 07/31/2021 CLINICAL DATA:  Altered mental status EXAM: CT HEAD WITHOUT CONTRAST TECHNIQUE: Contiguous axial images were obtained from the base of the skull through the vertex without intravenous contrast. RADIATION DOSE REDUCTION: This exam was performed according to the departmental dose-optimization program which includes automated exposure control, adjustment of the mA and/or kV according to patient size and/or use of iterative reconstruction technique. COMPARISON:  03/19/2021 FINDINGS: Brain: No acute intracranial findings are seen. There are no signs of bleeding within the cranium. Cortical sulci are prominent more so in the left cerebral hemisphere. There is no  focal effacement of cortical sulci. Vascular: Unremarkable. Skull: Unremarkable. Sinuses/Orbits: There is mucosal thickening in the ethmoid sinus. Other: None. IMPRESSION: No acute intracranial findings are seen in noncontrast CT brain. Atrophy. Electronically Signed   By: PElmer PickerM.D.   On: 07/31/2021 14:40   DG Chest Port 1 View  Addendum Date: 07/30/2021   ADDENDUM REPORT: 07/30/2021 14:27 ADDENDUM: Voice recognition error in the impression. For the first sentence, the NG tube should be described as malpositioned, not well positioned. NG tube curls in the neck and does not extend into the thorax and will need to be replaced/repositioned. Electronically Signed   By: DLajean ManesM.D.   On: 07/30/2021 14:27   Result Date: 07/30/2021 CLINICAL DATA:  NG tube placement. EXAM: PORTABLE CHEST 1 VIEW COMPARISON:  07/29/2021. FINDINGS: NG tube is partly visualized, curled in the neck, presumably in the oropharynx. Cardiac silhouette normal in size. No mediastinal or hilar masses. Lungs demonstrate prominent bronchovascular markings, otherwise clear. No pleural effusion or pneumothorax. IMPRESSION: 1. NG tube well positioned, curled in the neck, presumably in the oropharynx. 2. No acute cardiopulmonary disease. Electronically Signed: By: DLajean ManesM.D. On: 07/30/2021 13:46   DG Abd 1 View  Result Date: 07/30/2021  CLINICAL DATA:  Gastric distension EXAM: ABDOMEN - 1 VIEW COMPARISON:  None Available. FINDINGS: The bowel gas pattern is nonobstructive. Mild gaseous distension of stomach and bowel. There are no abnormal calcifications overlying the kidneys. Pelvic phleboliths. Chronic right anterolateral tenth eleventh rib injuries. Degenerative changes of the spine with levoconvex lumbar curvature. IMPRESSION: No evidence of bowel obstruction. Electronically Signed   By: Maurine Simmering M.D.   On: 07/30/2021 11:33   MR ABDOMEN MRCP WO CONTRAST  Result Date: 07/29/2021 CLINICAL DATA:  Upper abdominal  pain. Dilated common bile duct on ultrasound. EXAM: MRI ABDOMEN WITHOUT CONTRAST  (INCLUDING MRCP) TECHNIQUE: Multiplanar multisequence MR imaging of the abdomen was performed. Heavily T2-weighted images of the biliary and pancreatic ducts were obtained, and three-dimensional MRCP images were rendered by post processing. COMPARISON:  Ultrasound 07/29/2021 FINDINGS: Motion degradation exam. ICU patient. ICU patients have difficulty following breath holding commands. Lower chest:  Small bilateral pleural effusions Hepatobiliary: No intrahepatic biliary duct dilatation. Common bile duct normal caliber. The no gallstones identified. Small amount sludge within the gallbladder. No gallbladder distension or inflammation. Pancreas: Normal pancreatic parenchymal intensity. No ductal dilatation or inflammation. Spleen: Normal spleen. Adrenals/urinary tract: Adrenal glands and kidneys are normal. Stomach/Bowel: Stomach and limited of the small bowel is unremarkable Vascular/Lymphatic: Abdominal aortic normal caliber. No retroperitoneal periportal lymphadenopathy. Musculoskeletal: No aggressive osseous lesion IMPRESSION: 1. Normal common bile duct. No intrahepatic biliary duct dilatation. 2. Small amount gallbladder sludge.  No evidence of cholecystitis. 3. Normal pancreas. 4. Bibasilar atelectasis. 5. Imaging degraded by respiratory motion. Electronically Signed   By: Suzy Bouchard M.D.   On: 07/29/2021 19:59   MR 3D Recon At Scanner  Result Date: 07/29/2021 CLINICAL DATA:  Upper abdominal pain. Dilated common bile duct on ultrasound. EXAM: MRI ABDOMEN WITHOUT CONTRAST  (INCLUDING MRCP) TECHNIQUE: Multiplanar multisequence MR imaging of the abdomen was performed. Heavily T2-weighted images of the biliary and pancreatic ducts were obtained, and three-dimensional MRCP images were rendered by post processing. COMPARISON:  Ultrasound 07/29/2021 FINDINGS: Motion degradation exam. ICU patient. ICU patients have difficulty  following breath holding commands. Lower chest:  Small bilateral pleural effusions Hepatobiliary: No intrahepatic biliary duct dilatation. Common bile duct normal caliber. The no gallstones identified. Small amount sludge within the gallbladder. No gallbladder distension or inflammation. Pancreas: Normal pancreatic parenchymal intensity. No ductal dilatation or inflammation. Spleen: Normal spleen. Adrenals/urinary tract: Adrenal glands and kidneys are normal. Stomach/Bowel: Stomach and limited of the small bowel is unremarkable Vascular/Lymphatic: Abdominal aortic normal caliber. No retroperitoneal periportal lymphadenopathy. Musculoskeletal: No aggressive osseous lesion IMPRESSION: 1. Normal common bile duct. No intrahepatic biliary duct dilatation. 2. Small amount gallbladder sludge.  No evidence of cholecystitis. 3. Normal pancreas. 4. Bibasilar atelectasis. 5. Imaging degraded by respiratory motion. Electronically Signed   By: Suzy Bouchard M.D.   On: 07/29/2021 19:59   DG CHEST PORT 1 VIEW  Result Date: 07/29/2021 CLINICAL DATA:  Shortness of breath.  Chest pain.  Diaphoresis. EXAM: PORTABLE CHEST 1 VIEW COMPARISON:  07/29/2021 FINDINGS: Low lung volumes are present, causing crowding of the pulmonary vasculature. There is likely mild subsegmental atelectasis along both hemidiaphragms. Mild cardiomegaly. Prominence of the right upper mediastinal margin, cannot exclude adenopathy or nodule. IMPRESSION: 1. Low lung volumes and suspected mild atelectasis at the lung bases. 2. Somewhat nodular prominence the right upper mediastinum, cannot exclude adenopathy or nodule although this might possibly be from tortuous vasculature. Chest CT could be utilized for further characterization. 3. Mild cardiomegaly. Electronically Signed   By: Thayer Jew  Janeece Fitting M.D.   On: 07/29/2021 10:05   US Abdomen Limited RUQ (LIVER/GB)  Result Date: 07/29/2021 CLINICAL DATA:  Abnormal LFTs EXAM: ULTRASOUND ABDOMEN LIMITED RIGHT  UPPER QUADRANT COMPARISON:  CT abdomen and pelvis 07/28/2021 FINDINGS: Gallbladder: Moderately distended and contains echogenic calculi and sludge dependently. 5 mm echogenic likely polyp at the fundus. No significant wall thickening or pericholecystic fluid identified. Common bile duct: Diameter: 8 mm, upper normal for the patient's age. Liver: No focal lesion identified. Within normal limits in parenchymal echogenicity. Portal vein is patent on color Doppler imaging with normal direction of blood flow towards the liver. Other: None. IMPRESSION: 1. Cholelithiasis and sludge. 2. 5 mm likely polyp in the gallbladder. 3. Upper normal caliber of the common bile duct and moderate distention of the gallbladder. Correlate clinically and consider MRCP if indicated. Electronically Signed   By: Ofilia Neas M.D.   On: 07/29/2021 09:56   DG CHEST PORT 1 VIEW  Result Date: 07/29/2021 CLINICAL DATA:  Wheezing EXAM: PORTABLE CHEST 1 VIEW COMPARISON:  07/28/2021 FINDINGS: Limited low volume chest with generalized interstitial coarsening/crowding. No Kerley lines or pleural effusion. Normal heart size and mediastinal contours. Extensive artifact from EKG leads IMPRESSION: Limited low volume chest that is stable from yesterday Electronically Signed   By: Jorje Guild M.D.   On: 07/29/2021 06:48   DG Wrist Complete Left  Result Date: 07/29/2021 CLINICAL DATA:  Wrist pain EXAM: LEFT WRIST - COMPLETE 3+ VIEW COMPARISON:  None Available. FINDINGS: No fracture or malalignment. Vascular calcifications. Advanced joint space narrowing of the radiocarpal joint. No erosions. IMPRESSION: 1. 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 2. No acute osseous abnormality Electronically Signed   By: Donavan Foil M.D.   On: 07/29/2021 00:09   CT ABDOMEN PELVIS WO CONTRAST  Result Date: 07/28/2021 CLINICAL DATA:  Abdominal pain EXAM: CT ABDOMEN AND PELVIS WITHOUT CONTRAST TECHNIQUE: Multidetector CT imaging of the abdomen and pelvis was  performed following the standard protocol without IV contrast. RADIATION DOSE REDUCTION: This exam was performed according to the departmental dose-optimization program which includes automated exposure control, adjustment of the mA and/or kV according to patient size and/or use of iterative reconstruction technique. COMPARISON:  07/26/2021 FINDINGS: Lower chest: Small pleural effusions are noted bilaterally slightly increased when compared with the prior exam. Mild bibasilar atelectasis is seen. Hepatobiliary: Liver is within normal limits. Dependent gallstones are noted within the gallbladder which is well distended. Pneumobilia is again identified consistent with the given clinical history of sphincterotomy. Pancreas: Unremarkable. No pancreatic ductal dilatation or surrounding inflammatory changes. Spleen: Normal in size without focal abnormality. Adrenals/Urinary Tract: Adrenal glands are within normal limits bilaterally. Kidneys show no tiny nonobstructing renal stone on the right. No definitive stones are noted on the left. Hypodense lesion is noted within the left kidney measuring approximately 1.5 cm consistent with small cysts stable in appearance from the prior exam. No obstructive changes are seen. The bladder is well distended with mildly opacified urine. A portion of the bladder extends into a large right sided inguinal hernia. This is stable in appearance from the prior exam. Stomach/Bowel: Scattered fecal material is noted throughout the colon. Mild diverticular change is seen. The appendix has been surgically removed consistent with the given clinical history. Small bowel and stomach are unremarkable. Vascular/Lymphatic: Aortic atherosclerosis. No enlarged abdominal or pelvic lymph nodes. Reproductive: Prostate is unremarkable. Other: No ascites is noted. Bilateral fat containing inguinal hernias are seen. Persistent herniated bladder into the right hernia is noted. Musculoskeletal: Degenerative  changes of lumbar spine are seen. IMPRESSION: Small nonobstructing right renal stone. Bilateral inguinal hernias with herniated bladder on the right stable from the prior study. Multiple small gallstones without complicating factors. No other focal abnormality is noted. Electronically Signed   By: Inez Catalina M.D.   On: 07/28/2021 23:58   DG Chest Port 1 View  Result Date: 07/28/2021 CLINICAL DATA:  Respiratory distress EXAM: PORTABLE CHEST 1 VIEW COMPARISON:  07/26/2021 FINDINGS: Hypoventilatory changes. No consolidation or effusion. Stable cardiomediastinal silhouette. No pneumothorax IMPRESSION: No active disease.  Low lung volumes. Electronically Signed   By: Donavan Foil M.D.   On: 07/28/2021 22:42   DG Chest Port 1 View  Result Date: 07/26/2021 CLINICAL DATA:  leukocytosis, back pain, poss PNA? EXAM: PORTABLE CHEST 1 VIEW COMPARISON:  Radiograph 08/01/2018 FINDINGS: Unchanged cardiomediastinal silhouette. Low lung volumes. No focal airspace disease. No pleural effusion. No pneumothorax. 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 IMPRESSION: Low lung volumes.  No focal airspace disease. High-riding humeral heads bilaterally can be seen in distal rotator cuff tendinopathy. Electronically Signed   By: Maurine Simmering M.D.   On: 07/26/2021 14:09   CT Abdomen Pelvis W Contrast  Result Date: 07/26/2021 CLINICAL DATA:  Abdominal pain, acute, nonlocalized dementia, prior abd surgery, back? pain, diaphoresis, concern for aorta vs. nephrolith EXAM: CT ABDOMEN AND PELVIS WITH CONTRAST TECHNIQUE: Multidetector CT imaging of the abdomen and pelvis was performed using the standard protocol following bolus administration of intravenous contrast. RADIATION DOSE REDUCTION: This exam was performed according to the departmental dose-optimization program which includes automated exposure control, adjustment of the mA and/or kV according to patient size and/or use of iterative reconstruction  technique. CONTRAST:  53m OMNIPAQUE IOHEXOL 300 MG/ML  SOLN COMPARISON:  CT 12/23/2015 FINDINGS: Lower chest: No acute abnormality. Hepatobiliary: No focal liver abnormality is seen. Mild gallbladder distension with layering sludge or tiny stones. There is pneumobilia consistent with history of prior ERCP and sphincterotomy. Pancreas: Unremarkable. No pancreatic ductal dilatation or surrounding inflammatory changes. Spleen: Normal in size without focal abnormality. Adrenals/Urinary Tract: Adrenal glands are unremarkable. No hydronephrosis or nephrolithiasis. There are nonobstructive bilateral renal stones. Unchanged bilateral renal cysts. There is a right inguinal hernia containing herniated bladder. Stomach/Bowel: The stomach is within normal limits. There is no evidence of bowel obstruction.Prior appendectomy. Scattered colonic diverticula. No diverticulitis. Moderate rectal stool burden. Vascular/Lymphatic: Aortoiliac atherosclerosis. No AAA. No lymphadenopathy. Reproductive: Unremarkable. Other: Right inguinal hernia containing hernia bladder. Fat containing left inguinal hernia. Unchanged anterior abdominal wall scarring. Musculoskeletal: Levoconvex upper lumbar and dextroconvex lower lumbar curvatures. 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 There is severe multilevel facet arthropathy. Mild bilateral hip osteoarthritis. IMPRESSION: Nonobstructive 3 mm renal stones bilaterally. No hydronephrosis or ureterolithiasis. Right inguinal hernia containing distended herniated bladder. Mild gallbladder is tension with layering sludge or small stones. No other findings to suggest cholecystitis by CT. 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 Aortoiliac atherosclerosis.  No AAA. Moderate rectal stool burden. Electronically Signed   By: JMaurine SimmeringM.D.   On: 07/26/2021  10:10    DOrson Eva DO  [facility]  If 7PM-7AM, please contact night-coverage [email or web address] Password [email or web address] 08/06/2021, 2:56 PM   LOS: 8 days

## 2021-08-07 DIAGNOSIS — J9601 Acute respiratory failure with hypoxia: Secondary | ICD-10-CM | POA: Diagnosis not present

## 2021-08-07 DIAGNOSIS — N179 Acute kidney failure, unspecified: Secondary | ICD-10-CM | POA: Diagnosis not present

## 2021-08-07 DIAGNOSIS — G9341 Metabolic encephalopathy: Secondary | ICD-10-CM | POA: Diagnosis not present

## 2021-08-07 DIAGNOSIS — A419 Sepsis, unspecified organism: Secondary | ICD-10-CM | POA: Diagnosis not present

## 2021-08-07 DIAGNOSIS — R600 Localized edema: Secondary | ICD-10-CM | POA: Diagnosis present

## 2021-08-07 LAB — BASIC METABOLIC PANEL
Anion gap: 4 — ABNORMAL LOW (ref 5–15)
BUN: 28 mg/dL — ABNORMAL HIGH (ref 8–23)
CO2: 21 mmol/L — ABNORMAL LOW (ref 22–32)
Calcium: 7.1 mg/dL — ABNORMAL LOW (ref 8.9–10.3)
Chloride: 112 mmol/L — ABNORMAL HIGH (ref 98–111)
Creatinine, Ser: 0.92 mg/dL (ref 0.61–1.24)
GFR, Estimated: >60 mL/min (ref >60)
Glucose, Bld: 237 mg/dL — ABNORMAL HIGH (ref 70–99)
Potassium: 4.1 mmol/L (ref 3.5–5.1)
Sodium: 137 mmol/L (ref 135–145)

## 2021-08-07 LAB — COMPREHENSIVE METABOLIC PANEL
ALT: <5 U/L (ref 0–44)
AST: 35 U/L (ref 15–41)
Albumin: 1.6 g/dL — ABNORMAL LOW (ref 3.5–5.0)
Alkaline Phosphatase: 117 U/L (ref 38–126)
Anion gap: 4 — ABNORMAL LOW (ref 5–15)
BUN: 27 mg/dL — ABNORMAL HIGH (ref 8–23)
CO2: 18 mmol/L — ABNORMAL LOW (ref 22–32)
Calcium: 7.2 mg/dL — ABNORMAL LOW (ref 8.9–10.3)
Chloride: 110 mmol/L (ref 98–111)
Creatinine, Ser: 0.89 mg/dL (ref 0.61–1.24)
GFR, Estimated: >60 mL/min (ref >60)
Glucose, Bld: 233 mg/dL — ABNORMAL HIGH (ref 70–99)
Potassium: 4.3 mmol/L (ref 3.5–5.1)
Sodium: 132 mmol/L — ABNORMAL LOW (ref 135–145)
Total Bilirubin: 1.3 mg/dL — ABNORMAL HIGH (ref 0.3–1.2)
Total Protein: 6.2 g/dL — ABNORMAL LOW (ref 6.5–8.1)

## 2021-08-07 LAB — CBC
HCT: 25 % — ABNORMAL LOW (ref 39.0–52.0)
Hemoglobin: 8.2 g/dL — ABNORMAL LOW (ref 13.0–17.0)
MCH: 31.7 pg (ref 26.0–34.0)
MCHC: 32.8 g/dL (ref 30.0–36.0)
MCV: 96.5 fL (ref 80.0–100.0)
Platelets: 386 10*3/uL (ref 150–400)
RBC: 2.59 MIL/uL — ABNORMAL LOW (ref 4.22–5.81)
RDW: 15 % (ref 11.5–15.5)
WBC: 16.5 10*3/uL — ABNORMAL HIGH (ref 4.0–10.5)
nRBC: 0 % (ref 0.0–0.2)

## 2021-08-07 LAB — AEROBIC/ANAEROBIC CULTURE W GRAM STAIN (SURGICAL/DEEP WOUND): Gram Stain: NONE SEEN

## 2021-08-07 LAB — CULTURE, BLOOD (ROUTINE X 2): Special Requests: ADEQUATE

## 2021-08-07 LAB — GLUCOSE, CAPILLARY
Glucose-Capillary: 195 mg/dL — ABNORMAL HIGH (ref 70–99)
Glucose-Capillary: 208 mg/dL — ABNORMAL HIGH (ref 70–99)
Glucose-Capillary: 200 mg/dL — ABNORMAL HIGH (ref 70–99)

## 2021-08-07 LAB — MAGNESIUM: Magnesium: 1.8 mg/dL (ref 1.7–2.4)

## 2021-08-07 MED ORDER — SODIUM CHLORIDE 0.45 % IV SOLN
INTRAVENOUS | Status: DC
Start: 2021-08-07 — End: 2021-08-08

## 2021-08-07 MED ORDER — POLYETHYLENE GLYCOL 3350 17 G PO PACK
17.0000 g | PACK | Freq: Every day | ORAL | Status: DC
  Administered 2021-08-07 – 2021-08-10 (×3): 17 g via ORAL
  Filled 2021-08-07 (×3): qty 1

## 2021-08-07 MED ORDER — METOPROLOL TARTRATE 25 MG PO TABS
12.5000 mg | ORAL_TABLET | Freq: Two times a day (BID) | ORAL | Status: DC
  Administered 2021-08-07 – 2021-08-08 (×2): 12.5 mg via ORAL
  Filled 2021-08-07 (×2): qty 1

## 2021-08-07 NOTE — Plan of Care (Signed)

## 2021-08-07 NOTE — Progress Notes (Signed)
PROGRESS NOTE  Elijah [name] XLK:[id number] DOB: 09-14-1940 DOA: 07/28/2021 PCP: Celene Squibb, MD  Brief History:  As per H&P written by Dr.Zierle-Ghosh On 07/29/2021 Elijah [name] is a 81 y.o. male with medical history significant of history of chronic low back pain, diabetes mellitus type 2, hyperlipidemia, hypertension, Parkinson disease, obstructive sleep apnea, history of biliary obstruction and pancreatitis with ERCP in 2018, presents ED with chief complaint of dyspnea.  At time of presentation wife was at bedside.  She reported to the ED provider that patient has had 24 hours of shortness of breath and increased work of breathing.  He was recently seen in the ED 2 days ago for back pain, which was acute on chronic.  They had denied fevers at home, but wife did report the patient had left arm pain when she was trying to lift him up.  ED provider note reports that patient denied current abdominal pain.  Patient is not answering questions for me, but does have voluntary guarding with palpation of his abdomen.  Patient arrived in the ER satting in the low 90s on 4 L nasal cannula.  He was put on nonrebreather for work of breathing.  Sepsis protocol was started.  Patient was treated with vancomycin, cefepime, Flagyl, and given a 3 L bolus.  Lactic acid was significantly elevated 6.3, repeat 4.6.  Chest x-ray was negative for acute disease, CT abdomen pelvis did not given definitive explanation of his symptoms either.  Urine was borderline.  Patient did have an AKI.  Admission was requested for further work-up of altered mental status, dyspnea, and sepsis.   Patient was found to have GBS bacteremia with source as acute cholecystitis.  He was not a surgical candidate.  IR placed cholecystotomy tube on 6/13.  His IV antibiotics were continued.  Echo (TTE) was neg for vegetations.  His IVF was adjusted for his hypernatremia and his hydromorphone dosing was decreased with some improvement of his  encephalopathy.      Assessment and Plan: * Severe sepsis (HCC) -Heart rate 100, respiratory rate 30-44, lactic acidosis 6.3, AKI 2.24 -Bacteremia, acute cholecystitis and aspiration pneumonitis -Continue IV Rocephin -WBC's trending down after chole tube -Off BPAP and with good saturation demonstrated on 4-5 L [place] supplementation with great saturation. -COVID and flu are negative  Bacteremia due to group B Streptococcus 6/15 Repeat blood culture--GPC 1 of 2 sets Echo--EF 70-75%, no WMA, G1DD, no vegetation, trivial MR, mild TR Source - Gallbladder Repeat blood cultures--contaminant   Acute cholecystitis -as mentioned, patient not a candidate for surgery - IR placed cholecystostomy tube 08/02/21 -Continue supportive care and IV antibiotics.  AKI (acute kidney injury) ([place]) -Creatinine normally in the 0.6-0.8 range at baseline; at time of admission 2.24 and peaked at 2.72; now down to 1.36.>>1.22>>1.02>>0.92 -due to volume depletion and sepsis/hemodynamic changes  Thrombocytopenia (HCC) -due to sepsis -Platelets count nadir 70sK -improving with sepsis tx -restart heparin [place]  Acute respiratory failure with hypoxia ([place]) -Patient with good saturation on 1-2 L. -Patient had increasing work of breathing to the point of requiring transient use of BiPAP at time of admission. -Continue current IV antibiotics -now stable on RA  Arm edema Venous duplex  Hypernatremia - Na up to 158>>>137 -d/c D5W -start 1/2 NS  Acute metabolic encephalopathy -Baseline>>pleasant confusion with hallucinations -Patient with underlying history of mood disorder/dementia and Parkinson. -Continue to be lethargic--but slowly improving -6/11 CT head--neg -ammonia = 41>>22 -  6/11 ABG 7.45/32/77/22 on 2L -Palliative care consulted. -remains encephalopathic on 6/18, but more awake and occasionally follows commands -d/c hydromorphone>>>start norco  HLD (hyperlipidemia) -initially held due to poor  mentation and elevated LFTs -Holding statin for now.  Lactic acidosis -Secondary to sepsis -Initial lactic acid 6.3, repeat 4.6>> last one 1.1 -Continue to maintain adequate IV hydration  Total bilirubin, elevated -history of biliary obstruction and ERCP back in 2018 -CT abd--bilateral inguinal hernias, herniated bladder on the right, small nonobstructing stone, and gallstones without complication -MRCP--no retained CBD stones or any masses in his liver. -LFTs has continued trending down overall. -Continue IV antibiotics -After discussing with general surgery>>check HIDA scan; which demonstrated positive result for acute cholecystitis and cystic duct obstruction. -not a candidate for cholecystectomy/surgical intervention. -6/13--cholecystotomy tube  Essential hypertension -Continue holding ARB and  HCTZ due to AKI -Blood pressure has now stabilized -change lopressor IV to po metoprolol -BP and HR now controlled.   Diabetes mellitus without complication ([place]) -holding metformin -monitor glucose with CBGs and if it starts trending up will add insulin coverage. -so far CBG's are stable. -6/16 A1C--6.7  Parkinson disease ([employer]) -pt on Sinemet when able to take by mouth, but poor mentation precludes po intake initially -Patient with underlying mood disorder and mild hallucinations/psychosis as per patient's wife reports>>>worse over past 6-12 months at baseline -Unable to place NG tube on 07/30/21 (very traumatic and never able to passed from his neck). -now more awake -speech eval>>dys 2 with thin   Family Communication:   spouse updated 6/18   Consultants:  palliative   Code Status:  FULL   DVT Prophylaxis:  SCDs     Procedures: As Listed in Progress Note Above   Antibiotics: Ceftriaxone 6/11>> Cefepime 6/8>>6/11               Subjective:  Patient denies fevers, chills, headache, chest pain, dyspnea, nausea, vomiting, diarrhea, abdominal pain, dysuria,  hematuria, hematochezia, and melena.  Objective: Vitals:   08/07/21 0800 08/07/21 0900 08/07/21 1000 08/07/21 1125  BP: (!) 143/64 (!) 144/50 (!) 131/42   Pulse: 80 81 83 73  Resp: (!) 28 (!) 24 (!) 24 (!) 32  Temp:    100 F (37.8 C)  TempSrc:    Axillary  SpO2: 100% 97% 99% 100%  Weight:      Height:        Intake/Output Summary (Last 24 hours) at 08/07/2021 1412 Last data filed at 08/07/2021 1300 Gross per 24 hour  Intake 2655.37 ml  Output 2135 ml  Net 520.37 ml   Weight change: 0.8 kg Exam:  General:  Pt is alert, follows commands appropriately, not in acute distress HEENT: No icterus, No thrush, No neck mass, [place]/AT Cardiovascular: RRR, S1/S2, no rubs, no gallops Respiratory: CTA bilaterally, no wheezing, no crackles, no rhonchi Abdomen: Soft/+BS, non tender, non distended, no guarding Extremities: 2 + LE and UE edema, No lymphangitis, No petechiae, No rashes, no synovitis   Data Reviewed: I have personally reviewed following labs and imaging studies Basic Metabolic Panel: Recent Labs  Lab 08/04/21 0406 08/04/21 0616 08/05/21 0327 08/06/21 0353 08/07/21 0443  NA 151* 159* 154* 145 137  K 3.7 4.3 4.1 4.0 4.1  CL 127* >130* 128* 120* 112*  CO2 '22 23 23 23 '$ 21*  GLUCOSE 571* 191* 203* 263* 237*  BUN 54* 60* 52* 34* 28*  CREATININE 1.25* 1.36* 1.22 1.02 0.92  CALCIUM 6.9* 7.8* 7.5* 7.1* 7.1*  MG 2.2  --  2.3  --  1.8   Liver Function Tests: Recent Labs  Lab 08/01/21 1434 08/03/21 0353  AST 32 23  ALT 28 22  ALKPHOS 169* 131*  BILITOT 3.1* 2.1*  PROT 5.2* 5.7*  ALBUMIN 2.0* 1.9*   No results for input(s): "LIPASE", "AMYLASE" in the last 168 hours. Recent Labs  Lab 07/31/21 1423 08/05/21 0327  AMMONIA 41* 22   Coagulation Profile: No results for input(s): "INR", "PROTIME" in the last 168 hours. CBC: Recent Labs  Lab 08/03/21 0353 08/04/21 0406 08/05/21 0327 08/06/21 0353 08/07/21 0443  WBC 19.6* 14.9* 16.5* 16.7* 16.5*  HGB 11.1* 9.0*  9.4* 8.6* 8.2*  HCT 34.7* 28.7* 30.5* 27.0* 25.0*  MCV 98.3 101.1* 100.7* 98.5 96.5  PLT 177 191 270 327 386   Cardiac Enzymes: No results for input(s): "CKTOTAL", "CKMB", "CKMBINDEX", "TROPONINI" in the last 168 hours. BNP: Invalid input(s): "POCBNP" CBG: Recent Labs  Lab 08/06/21 0542 08/06/21 1121 08/07/21 0214 08/07/21 0558 08/07/21 1126  GLUCAP 241* 265* 195* 208* 200*   HbA1C: Recent Labs    08/05/21 0327  HGBA1C 6.7*   Urine analysis:    Component Value Date/Time   COLORURINE AMBER (A) 07/29/2021 1000   APPEARANCEUR CLOUDY (A) 07/29/2021 1000   LABSPEC 1.023 07/29/2021 1000   PHURINE 5.0 07/29/2021 1000   GLUCOSEU 50 (A) 07/29/2021 1000   HGBUR SMALL (A) 07/29/2021 1000   BILIRUBINUR NEGATIVE 07/29/2021 1000   KETONESUR 5 (A) 07/29/2021 1000   PROTEINUR 30 (A) 07/29/2021 1000   UROBILINOGEN 0.2 10/14/2009 1851   NITRITE NEGATIVE 07/29/2021 1000   LEUKOCYTESUR NEGATIVE 07/29/2021 1000   Sepsis Labs: '@LABRCNTIP'$ (procalcitonin:4,lacticidven:4) ) Recent Results (from the past 240 hour(s))  Culture, blood (Routine x 2)     Status: Abnormal   Collection Time: 07/28/21 10:33 PM   Specimen: BLOOD LEFT WRIST  Result Value Ref Range Status   Specimen Description   Final    BLOOD LEFT WRIST Performed at [facility], [address] [place], [place] [address]    Special Requests   Final    BOTTLES DRAWN AEROBIC AND ANAEROBIC Blood Culture adequate volume Performed at [facility], [address], [place], [place] [address]    Culture  Setup Time   Final    GRAM POSITIVE COCCI IN BOTH AEROBIC AND ANAEROBIC BOTTLES Gram Stain Report Called to,Read Back By and Verified With: [place] @ 8413 ON [id number] BY HENDERSON L CRITICAL VALUE NOTED.  VALUE IS CONSISTENT WITH PREVIOUSLY REPORTED AND CALLED VALUE.    Culture (A)  Final    GROUP B STREP(S.AGALACTIAE)ISOLATED SUSCEPTIBILITIES PERFORMED ON PREVIOUS CULTURE WITHIN THE LAST 5 DAYS. Performed at [place], [place] [address], [place], [place] [address]    Report Status 07/31/2021 FINAL  Final  Culture, blood (Routine x 2)     Status: Abnormal   Collection Time: 07/28/21 10:33 PM   Specimen: BLOOD RIGHT HAND  Result Value Ref Range Status   Specimen Description   Final    BLOOD RIGHT HAND Performed at [facility], [address], [place], [place] [address]    Special Requests   Final    BOTTLES DRAWN AEROBIC AND ANAEROBIC Blood Culture results may not be optimal due to an excessive volume of blood received in culture bottles Performed at [facility], [address] [place], [place] [address]    Culture  Setup Time   Final    GRAM POSITIVE COCCI IN BOTH AEROBIC AND ANAEROBIC BOTTLES Gram Stain Report Called to,Read Back By  and Verified With: CHILDRESS @ 0845 ON [id number] BY HENDERSON L CRITICAL RESULT CALLED TO, READ BACK BY AND VERIFIED WITH: Estelle June [phone] FCP Performed at [facility], [place] [address] [place], [place] [address]    Culture GROUP B STREP(S.AGALACTIAE)ISOLATED (A)  Final   Report Status 07/31/2021 FINAL  Final   Organism ID, Bacteria GROUP B STREP(S.AGALACTIAE)ISOLATED  Final      Susceptibility   Group b strep(s.agalactiae)isolated - MIC*    CLINDAMYCIN <=0.25 SENSITIVE Sensitive     AMPICILLIN <=0.25 SENSITIVE Sensitive     ERYTHROMYCIN <=0.12 SENSITIVE Sensitive     VANCOMYCIN 0.5 SENSITIVE Sensitive     CEFTRIAXONE <=0.12 SENSITIVE Sensitive     LEVOFLOXACIN 1 SENSITIVE Sensitive     * GROUP B STREP(S.AGALACTIAE)ISOLATED  Blood Culture ID Panel (Reflexed)     Status: Abnormal   Collection Time: 07/28/21 10:33 PM  Result Value Ref Range Status   Enterococcus faecalis NOT DETECTED NOT DETECTED Final   Enterococcus Faecium NOT DETECTED NOT DETECTED Final   Listeria monocytogenes NOT DETECTED NOT DETECTED Final   Staphylococcus species NOT DETECTED NOT DETECTED Final   Staphylococcus aureus (BCID) NOT DETECTED NOT DETECTED Final   Staphylococcus  epidermidis NOT DETECTED NOT DETECTED Final   Staphylococcus lugdunensis NOT DETECTED NOT DETECTED Final   Streptococcus species DETECTED (A) NOT DETECTED Final    Comment: CRITICAL RESULT CALLED TO, READ BACK BY AND VERIFIED WITH: PHARMD STEVEN H [phone] FCP    Streptococcus agalactiae DETECTED (A) NOT DETECTED Final    Comment: CRITICAL RESULT CALLED TO, READ BACK BY AND VERIFIED WITH: PHARMD STEVEN H [phone] FCP    Streptococcus pneumoniae NOT DETECTED NOT DETECTED Final   Streptococcus pyogenes NOT DETECTED NOT DETECTED Final   A.calcoaceticus-baumannii NOT DETECTED NOT DETECTED Final   Bacteroides fragilis NOT DETECTED NOT DETECTED Final   Enterobacterales NOT DETECTED NOT DETECTED Final   Enterobacter cloacae complex NOT DETECTED NOT DETECTED Final   Escherichia coli NOT DETECTED NOT DETECTED Final   Klebsiella aerogenes NOT DETECTED NOT DETECTED Final   Klebsiella oxytoca NOT DETECTED NOT DETECTED Final   Klebsiella pneumoniae NOT DETECTED NOT DETECTED Final   Proteus species NOT DETECTED NOT DETECTED Final   Salmonella species NOT DETECTED NOT DETECTED Final   Serratia marcescens NOT DETECTED NOT DETECTED Final   Haemophilus influenzae NOT DETECTED NOT DETECTED Final   Neisseria meningitidis NOT DETECTED NOT DETECTED Final   Pseudomonas aeruginosa NOT DETECTED NOT DETECTED Final   Stenotrophomonas maltophilia NOT DETECTED NOT DETECTED Final   Candida albicans NOT DETECTED NOT DETECTED Final   Candida auris NOT DETECTED NOT DETECTED Final   Candida glabrata NOT DETECTED NOT DETECTED Final   Candida krusei NOT DETECTED NOT DETECTED Final   Candida parapsilosis NOT DETECTED NOT DETECTED Final   Candida tropicalis NOT DETECTED NOT DETECTED Final   Cryptococcus neoformans/gattii NOT DETECTED NOT DETECTED Final    Comment: Performed at [facility], [place]. [address] [place], [place] [address]  Resp Panel by RT-PCR (Flu A&B, Covid) Anterior Nasal Swab     Status: None    Collection Time: 07/28/21 10:51 PM   Specimen: Anterior Nasal Swab  Result Value Ref Range Status   SARS Coronavirus 2 by RT PCR NEGATIVE NEGATIVE Final    Comment: (NOTE) SARS-CoV-2 target nucleic acids are NOT DETECTED.  The SARS-CoV-2 RNA is generally detectable in upper respiratory specimens during the acute phase of infection. The lowest concentration of SARS-CoV-2  viral copies this assay can detect is 138 copies/mL. A negative result does not preclude SARS-Cov-2 infection and should not be used as the sole basis for treatment or other patient management decisions. A negative result may occur with  improper specimen collection/handling, submission of specimen other than nasopharyngeal swab, presence of viral mutation(s) within the areas targeted by this assay, and inadequate number of viral copies(<138 copies/mL). A negative result must be combined with clinical observations, patient history, and epidemiological information. The expected result is Negative.  Fact Sheet for Patients:  EntrepreneurPulse.com.au  Fact Sheet for Healthcare Providers:  IncredibleEmployment.be  This test is no t yet approved or cleared by the [place] FDA and  has been authorized for detection and/or diagnosis of SARS-CoV-2 by FDA under an Emergency Use Authorization (EUA). This EUA will remain  in effect (meaning this test can be used) for the duration of the COVID-19 declaration under Section 564(b)(1) of the Act, 21 U.S.C.section 360bbb-3(b)(1), unless the authorization is terminated  or revoked sooner.       Influenza A by PCR NEGATIVE NEGATIVE Final   Influenza B by PCR NEGATIVE NEGATIVE Final    Comment: (NOTE) The Xpert Xpress SARS-CoV-2/FLU/RSV plus assay is intended as an aid in the diagnosis of influenza from Nasopharyngeal swab specimens and should not be used as a sole basis for treatment. Nasal washings and aspirates are unacceptable for  Xpert Xpress SARS-CoV-2/FLU/RSV testing.  Fact Sheet for Patients: EntrepreneurPulse.com.au  Fact Sheet for Healthcare Providers: IncredibleEmployment.be  This test is not yet approved or cleared by the [place] FDA and has been authorized for detection and/or diagnosis of SARS-CoV-2 by FDA under an Emergency Use Authorization (EUA). This EUA will remain in effect (meaning this test can be used) for the duration of the COVID-19 declaration under Section 564(b)(1) of the Act, 21 U.S.C. section 360bbb-3(b)(1), unless the authorization is terminated or revoked.  Performed at [facility], [address] [place], [place] [address]   Urine Culture     Status: None   Collection Time: 07/29/21  2:19 AM   Specimen: Urine, Clean Catch  Result Value Ref Range Status   Specimen Description   Final    URINE, CLEAN CATCH Performed at [facility], [address], [place], [place] [address]    Special Requests   Final    NONE Performed at [facility], [address] [place], [place] [address]    Culture   Final    NO GROWTH Performed at [facility], [place] [address], [place], [place] [address]    Report Status 07/31/2021 FINAL  Final  MRSA Next Gen by PCR, Nasal     Status: None   Collection Time: 07/29/21 10:27 AM   Specimen: Nasal Mucosa; Nasal Swab  Result Value Ref Range Status   MRSA by PCR Next Gen NOT DETECTED NOT DETECTED Final    Comment: (NOTE) The GeneXpert MRSA Assay (FDA approved for NASAL specimens only), is one component of a comprehensive MRSA colonization surveillance program. It is not intended to diagnose MRSA infection nor to guide or monitor treatment for MRSA infections. Test performance is not FDA approved in patients less than 45 years old. Performed at [facility], [address] [place], [place] [address]   Aerobic/Anaerobic Culture w Gram Stain (surgical/deep wound)     Status: Abnormal   Collection Time:  08/02/21  2:25 PM   Specimen: Gallbladder; Bile  Result Value Ref Range Status   Specimen Description  Final    GALL BLADDER Performed at [facility], [address] [place], [place] [address]    Special Requests   Final    NONE Performed at [facility], [address] [place], [place] [address]    Gram Stain   Final    NO WBC SEEN FEW GRAM POSITIVE COCCI IN PAIRS FEW GRAM NEGATIVE RODS MODERATE BRANCHING, BEADING  GRAM POSITIVE RODS    Culture (A)  Final    MULTIPLE ORGANISMS PRESENT, NONE PREDOMINANT NO GROUP A STREP (S.PYOGENES) ISOLATED NO STAPHYLOCOCCUS AUREUS ISOLATED NO ANAEROBES ISOLATED Performed at [facility], [address] [place], [place] [address]    Report Status 08/07/2021 FINAL  Final  Culture, blood (Routine X 2) w Reflex to ID Panel     Status: Abnormal   Collection Time: 08/04/21  4:06 AM   Specimen: Right Antecubital; Blood  Result Value Ref Range Status   Specimen Description   Final    RIGHT ANTECUBITAL Performed at [facility] [address], [place], [place] [address]    Special Requests   Final    BOTTLES DRAWN AEROBIC AND ANAEROBIC Blood Culture adequate volume Performed at [facility], [address] [place],  [address]    Culture  Setup Time   Final    GRAM POSITIVE COCCI ANAEROBIC BOTTLE ONLY Gram Stain Report Called to,Read Back By and Verified With: MCGIB[place]ONY @ 6808 ON [id number] BY HENDERSON L CRITICAL RESULT CALLED TO, READ BACK BY AND VERIFIED WITH: RN Ralph Dowdy [phone] '@1928'$  FH    Culture (A)  Final    STAPHYLOCOCCUS EPIDERMIDIS THE SIGNIFICANCE OF ISOLATING THIS ORGANISM FROM A SINGLE SET OF BLOOD CULTURES WHEN MULTIPLE SETS ARE DRAWN IS UNCERTAIN. PLEASE NOTIFY THE MICROBIOLOGY DEPARTMENT WITHIN ONE WEEK IF SPECIATION AND SENSITIVITIES ARE REQUIRED. Performed at [facility], [place] [address], [place],  [address]    Report Status 08/07/2021 FINAL  Final  Blood Culture ID Panel (Reflexed)     Status:  Abnormal   Collection Time: 08/04/21  4:06 AM  Result Value Ref Range Status   Enterococcus faecalis NOT DETECTED NOT DETECTED Final   Enterococcus Faecium NOT DETECTED NOT DETECTED Final   Listeria monocytogenes NOT DETECTED NOT DETECTED Final   Staphylococcus species DETECTED (A) NOT DETECTED Final    Comment: CRITICAL RESULT CALLED TO, READ BACK BY AND VERIFIED WITH: RN Ralph Dowdy [phone] '@1928'$  FH    Staphylococcus aureus (BCID) NOT DETECTED NOT DETECTED Final   Staphylococcus epidermidis DETECTED (A) NOT DETECTED Final    Comment: Methicillin (oxacillin) resistant coagulase negative staphylococcus. Possible blood culture contaminant (unless isolated from more than one blood culture draw or clinical case suggests pathogenicity). No antibiotic treatment is indicated for blood  culture contaminants. CRITICAL RESULT CALLED TO, READ BACK BY AND VERIFIED WITH: RN Ralph Dowdy [phone] '@1928'$  FH    Staphylococcus lugdunensis NOT DETECTED NOT DETECTED Final   Streptococcus species NOT DETECTED NOT DETECTED Final   Streptococcus agalactiae NOT DETECTED NOT DETECTED Final   Streptococcus pneumoniae NOT DETECTED NOT DETECTED Final   Streptococcus pyogenes NOT DETECTED NOT DETECTED Final   A.calcoaceticus-baumannii NOT DETECTED NOT DETECTED Final   Bacteroides fragilis NOT DETECTED NOT DETECTED Final   Enterobacterales NOT DETECTED NOT DETECTED Final   Enterobacter cloacae complex NOT DETECTED NOT DETECTED Final   Escherichia coli NOT DETECTED NOT DETECTED Final   Klebsiella aerogenes NOT DETECTED NOT DETECTED Final   Klebsiella oxytoca NOT DETECTED NOT DETECTED Final  Klebsiella pneumoniae NOT DETECTED NOT DETECTED Final   Proteus species NOT DETECTED NOT DETECTED Final   Salmonella species NOT DETECTED NOT DETECTED Final   Serratia marcescens NOT DETECTED NOT DETECTED Final   Haemophilus influenzae NOT DETECTED NOT DETECTED Final   Neisseria meningitidis NOT DETECTED NOT DETECTED Final    Pseudomonas aeruginosa NOT DETECTED NOT DETECTED Final   Stenotrophomonas maltophilia NOT DETECTED NOT DETECTED Final   Candida albicans NOT DETECTED NOT DETECTED Final   Candida auris NOT DETECTED NOT DETECTED Final   Candida glabrata NOT DETECTED NOT DETECTED Final   Candida krusei NOT DETECTED NOT DETECTED Final   Candida parapsilosis NOT DETECTED NOT DETECTED Final   Candida tropicalis NOT DETECTED NOT DETECTED Final   Cryptococcus neoformans/gattii NOT DETECTED NOT DETECTED Final   Methicillin resistance mecA/C DETECTED (A) NOT DETECTED Final    Comment: CRITICAL RESULT CALLED TO, READ BACK BY AND VERIFIED WITH: RN Ralph Dowdy [phone] '@1928'$  FH Performed at [facility], [address], [place], [place] [address]   Culture, blood (Routine X 2) w Reflex to ID Panel     Status: None (Preliminary result)   Collection Time: 08/04/21  6:15 AM   Specimen: BLOOD LEFT HAND  Result Value Ref Range Status   Specimen Description BLOOD LEFT HAND  Final   Special Requests   Final    BOTTLES DRAWN AEROBIC AND ANAEROBIC Blood Culture adequate volume   Culture   Final    NO GROWTH 3 DAYS Performed at [facility], [address] [place],  [address]    Report Status PENDING  Incomplete     Scheduled Meds:  aspirin  300 mg Rectal Daily   bisacodyl  10 mg Rectal Daily   budesonide (PULMICORT) nebulizer solution  0.5 mg Nebulization BID   Carbidopa-Levodopa ER  1 tablet Oral Q supper   Carbidopa-Levodopa ER  2 tablet Oral TID   Chlorhexidine Gluconate Cloth  6 each Topical Q0600   heparin injection (subcutaneous)  5,000 Units Subcutaneous Q8H   metoprolol tartrate  2.5 mg Intravenous Q12H   polyethylene glycol  17 g Oral Daily   sodium chloride flush  10-40 mL Intracatheter Q12H   sodium chloride flush  5-10 mL Intracatheter Q8H   Continuous Infusions:  sodium chloride     cefTRIAXone (ROCEPHIN)  IV 2 g (08/06/21 1945)    Procedures/Studies: ECHOCARDIOGRAM COMPLETE  Result  Date: 08/04/2021    ECHOCARDIOGRAM REPORT   Patient Name:   ARDEAN MELROY Date of Exam: 08/04/2021 Medical Rec #:  [id number]       Height:       70.0 in Accession #:    [id number]      Weight:       199.7 lb Date of Birth:  08-05-1940      BSA:          2.086 m Patient Age:    62 years        BP:           139/46 mmHg Patient Gender: M               HR:           81 bpm. Exam Location:  [facility] Procedure: 2D Echo, Cardiac Doppler and Color Doppler Indications:    Bacteremia  History:        Patient has no prior history of Echocardiogram examinations.  Signs/Symptoms:Bacteremia; Risk Factors:Hypertension, Diabetes                 and Dyslipidemia. Technically difficult study. Patient with                 Parkinson's disease is unable to follow instructions.  Sonographer:    Wenda Low Referring Phys: [phone] Jeter Tomey  Sonographer Comments: Technically difficult study due to poor echo windows. Image acquisition challenging due to respiratory motion. IMPRESSIONS  1. Left ventricular ejection fraction, by estimation, is 70 to 75%. The left ventricle has hyperdynamic function. The left ventricle has no regional wall motion abnormalities. Left ventricular diastolic parameters are consistent with Grade I diastolic dysfunction (impaired relaxation).  2. Right ventricular systolic function is normal. The right ventricular size is normal. There is normal pulmonary artery systolic pressure. The estimated right ventricular systolic pressure is 33.3 mmHg.  3. The mitral valve is grossly normal. Trivial mitral valve regurgitation.  4. The aortic valve is calcified. Aortic valve regurgitation is not visualized. Aortic valve sclerosis/calcification is present, without any evidence of aortic stenosis. Aortic valve mean gradient measures 6.0 mmHg.  5. The inferior vena cava is normal in size with greater than 50% respiratory variability, suggesting right atrial pressure of 3 mmHg. Comparison(s): No prior  Echocardiogram. Conclusion(s)/Recommendation(s): No evidence of valvular vegetations on this transthoracic echocardiogram. Consider a transesophageal echocardiogram to exclude infective endocarditis if clinically indicated. FINDINGS  Left Ventricle: Left ventricular ejection fraction, by estimation, is 70 to 75%. The left ventricle has hyperdynamic function. The left ventricle has no regional wall motion abnormalities. The left ventricular internal cavity size was normal in size. There is no left ventricular hypertrophy. Left ventricular diastolic parameters are consistent with Grade I diastolic dysfunction (impaired relaxation). Indeterminate filling pressures. Right Ventricle: The right ventricular size is normal. No increase in right ventricular wall thickness. Right ventricular systolic function is normal. There is normal pulmonary artery systolic pressure. The tricuspid regurgitant velocity is 2.66 m/s, and  with an assumed right atrial pressure of 3 mmHg, the estimated right ventricular systolic pressure is 54.5 mmHg. Left Atrium: Left atrial size was normal in size. Right Atrium: Right atrial size was normal in size. Pericardium: There is no evidence of pericardial effusion. Mitral Valve: The mitral valve is grossly normal. Trivial mitral valve regurgitation. MV peak gradient, 3.1 mmHg. The mean mitral valve gradient is 1.0 mmHg. Tricuspid Valve: The tricuspid valve is grossly normal. Tricuspid valve regurgitation is mild. Aortic Valve: The aortic valve is calcified. Aortic valve regurgitation is not visualized. Aortic valve sclerosis/calcification is present, without any evidence of aortic stenosis. Aortic valve mean gradient measures 6.0 mmHg. Aortic valve peak gradient measures 10.8 mmHg. Aortic valve area, by VTI measures 2.18 cm. Pulmonic Valve: The pulmonic valve was normal in structure. Pulmonic valve regurgitation is not visualized. Aorta: The aortic root and ascending aorta are structurally normal,  with no evidence of dilitation. Venous: The inferior vena cava is normal in size with greater than 50% respiratory variability, suggesting right atrial pressure of 3 mmHg. IAS/Shunts: No atrial level shunt detected by color flow Doppler.  LEFT VENTRICLE PLAX 2D LVIDd:         4.90 cm   Diastology LVIDs:         2.70 cm   LV e' medial:    6.42 cm/s LV PW:         1.10 cm   LV E/e' medial:  10.7 LV IVS:  1.00 cm   LV e' lateral:   12.20 cm/s LVOT diam:     1.90 cm   LV E/e' lateral: 5.7 LV SV:         71 LV SV Index:   34 LVOT Area:     2.84 cm  LEFT ATRIUM           Index LA diam:      3.50 cm 1.68 cm/m LA Vol (A4C): 22.8 ml 10.93 ml/m  AORTIC VALVE AV Area (Vmax):    1.83 cm AV Area (Vmean):   1.73 cm AV Area (VTI):     2.18 cm AV Vmax:           164.00 cm/s AV Vmean:          119.000 cm/s AV VTI:            0.325 m AV Peak Grad:      10.8 mmHg AV Mean Grad:      6.0 mmHg LVOT Vmax:         106.00 cm/s LVOT Vmean:        72.700 cm/s LVOT VTI:          0.250 m LVOT/AV VTI ratio: 0.77  AORTA Ao Root diam: 3.40 cm MITRAL VALVE               TRICUSPID VALVE MV Area (PHT): 2.85 cm    TR Peak grad:   28.3 mmHg MV Area VTI:   3.35 cm    TR Vmax:        266.00 cm/s MV Peak grad:  3.1 mmHg MV Mean grad:  1.0 mmHg    SHUNTS MV Vmax:       0.88 m/s    Systemic VTI:  0.25 m MV Vmean:      48.2 cm/s   Systemic Diam: 1.90 cm MV Decel Time: 266 msec MV E velocity: 69.00 cm/s MV A velocity: 56.10 cm/s MV E/A ratio:  1.23 Lyman Bishop MD Electronically signed by Lyman Bishop MD Signature Date/Time: 08/04/2021/4:53:01 PM    Final    [place] EKG SITE RITE  Result Date: 08/03/2021 If Site Rite image not attached, placement could not be confirmed due to current cardiac rhythm.  IR Perc Cholecystostomy  Result Date: 08/02/2021 INDICATION: 81 year old male referred for percutaneous cholecystostomy EXAM: CHOLECYSTOSTOMY MEDICATIONS: 500 mg levofloxacin; The antibiotic was administered within an appropriate time frame  prior to the initiation of the procedure. ANESTHESIA/SEDATION: Moderate (conscious) sedation was employed during this procedure. A total of Versed 0.5 mg and Fentanyl 25 mcg was administered intravenously. Moderate Sedation Time: 11 minutes. The patient's level of consciousness and vital signs were monitored continuously by radiology nursing throughout the procedure under my direct supervision. FLUOROSCOPY TIME:  Fluoroscopy Time: 0 minutes 24 seconds (3 mGy). COMPLICATIONS: None PROCEDURE: Informed written consent was obtained from the patient and the patient's family after a thorough discussion of the procedural risks, benefits and alternatives. All questions were addressed. Maximal Sterile Barrier Technique was utilized including caps, mask, sterile gowns, sterile gloves, sterile drape, hand hygiene and skin antiseptic. A timeout was performed prior to the initiation of the procedure. Ultrasound survey of the right upper quadrant was performed for planning purposes. Once the patient is prepped and draped in the usual sterile fashion, the skin and subcutaneous tissues overlying the gallbladder were generously infiltrated 1% lidocaine for local anesthesia. A coaxial needle was advanced under ultrasound guidance through the skin subcutaneous tissues and a small segment  of liver into the gallbladder lumen. With removal of the stylet, spontaneous dark bile drainage occurred. Using modified Seldinger technique, a 10 French drain was placed into the gallbladder fossa, with aspiration of the sample for the lab. Contrast injection confirmed position of the tube within the gallbladder lumen. Drainage catheter was attached to gravity drain with a suture retention placed. Patient tolerated the procedure well and remained hemodynamically stable throughout. No complications were encountered and no significant blood loss encountered. IMPRESSION: Status post percutaneous cholecystostomy Signed, Dulcy Fanny. Nadene Rubins, RPVI  [facility] Electronically Signed   By: Corrie Mckusick D.O.   On: 08/02/2021 14:33   NM Hepatobiliary Liver Func  Result Date: 08/01/2021 CLINICAL DATA:  Concern for cholecystitis. EXAM: NUCLEAR MEDICINE HEPATOBILIARY IMAGING TECHNIQUE: Sequential images of the abdomen were obtained out to 60 minutes following intravenous administration of radiopharmaceutical. 3 mm IV morphine administered to augment filling of the gallbladder. RADIOPHARMACEUTICALS:  5.5 mCi Tc-15m Choletec IV COMPARISON:  MRI 07/29/2021 FINDINGS: Uniform accumulation radiotracer within the liver. Counts are evident within the small bowel by 20 minutes. The gallbladder fails to fill at 60 minutes. IV morphine was administered to augment filling of the gallbladder. Post morphine injection, the gallbladder fails to fill. IMPRESSION: 1. Non filling of the gallbladder is consistent with obstruction of cystic duct / acute cholecystitis. 2. Patent common bile duct. These results will be called to the ordering clinician or representative by the Radiologist Assistant, and communication documented in the PACS or [employer] Electronically Signed   By: SSuzy BouchardM.D.   On: 08/01/2021 11:52   CT HEAD WO CONTRAST (5MM)  Result Date: 07/31/2021 CLINICAL DATA:  Altered mental status EXAM: CT HEAD WITHOUT CONTRAST TECHNIQUE: Contiguous axial images were obtained from the base of the skull through the vertex without intravenous contrast. RADIATION DOSE REDUCTION: This exam was performed according to the departmental dose-optimization program which includes automated exposure control, adjustment of the mA and/or kV according to patient size and/or use of iterative reconstruction technique. COMPARISON:  03/19/2021 FINDINGS: Brain: No acute intracranial findings are seen. There are no signs of bleeding within the cranium. Cortical sulci are prominent more so in the left cerebral  hemisphere. There is no focal effacement of cortical sulci. Vascular: Unremarkable. Skull: Unremarkable. Sinuses/Orbits: There is mucosal thickening in the ethmoid sinus. Other: None. IMPRESSION: No acute intracranial findings are seen in noncontrast CT brain. Atrophy. Electronically Signed   By: PElmer PickerM.D.   On: 07/31/2021 14:40   DG Chest Port 1 View  Addendum Date: 07/30/2021   ADDENDUM REPORT: 07/30/2021 14:27 ADDENDUM: Voice recognition error in the impression. For the first sentence, the NG tube should be described as malpositioned, not well positioned. NG tube curls in the neck and does not extend into the thorax and will need to be replaced/repositioned. Electronically Signed   By: DLajean ManesM.D.   On: 07/30/2021 14:27   Result Date: 07/30/2021 CLINICAL DATA:  NG tube placement. EXAM: PORTABLE CHEST 1 VIEW COMPARISON:  07/29/2021. FINDINGS: NG tube is partly visualized, curled in the neck, presumably in the oropharynx. Cardiac silhouette normal in size. No mediastinal or hilar masses. Lungs demonstrate prominent bronchovascular markings, otherwise clear. No pleural effusion or pneumothorax. IMPRESSION: 1. NG tube well positioned, curled in the neck, presumably in the oropharynx. 2. No acute cardiopulmonary disease. Electronically Signed: By: DLajean ManesM.D. On: 07/30/2021 13:46   DG Abd 1 View  Result Date: 07/30/2021  CLINICAL DATA:  Gastric distension EXAM: ABDOMEN - 1 VIEW COMPARISON:  None Available. FINDINGS: The bowel gas pattern is nonobstructive. Mild gaseous distension of stomach and bowel. There are no abnormal calcifications overlying the kidneys. Pelvic phleboliths. Chronic right anterolateral tenth eleventh rib injuries. Degenerative changes of the spine with levoconvex lumbar curvature. IMPRESSION: No evidence of bowel obstruction. Electronically Signed   By: Maurine Simmering M.D.   On: 07/30/2021 11:33   MR ABDOMEN MRCP WO CONTRAST  Result Date: 07/29/2021 CLINICAL  DATA:  Upper abdominal pain. Dilated common bile duct on ultrasound. EXAM: MRI ABDOMEN WITHOUT CONTRAST  (INCLUDING MRCP) TECHNIQUE: Multiplanar multisequence MR imaging of the abdomen was performed. Heavily T2-weighted images of the biliary and pancreatic ducts were obtained, and three-dimensional MRCP images were rendered by post processing. COMPARISON:  Ultrasound 07/29/2021 FINDINGS: Motion degradation exam. ICU patient. ICU patients have difficulty following breath holding commands. Lower chest:  Small bilateral pleural effusions Hepatobiliary: No intrahepatic biliary duct dilatation. Common bile duct normal caliber. The no gallstones identified. Small amount sludge within the gallbladder. No gallbladder distension or inflammation. Pancreas: Normal pancreatic parenchymal intensity. No ductal dilatation or inflammation. Spleen: Normal spleen. Adrenals/urinary tract: Adrenal glands and kidneys are normal. Stomach/Bowel: Stomach and limited of the small bowel is unremarkable Vascular/Lymphatic: Abdominal aortic normal caliber. No retroperitoneal periportal lymphadenopathy. Musculoskeletal: No aggressive osseous lesion IMPRESSION: 1. Normal common bile duct. No intrahepatic biliary duct dilatation. 2. Small amount gallbladder sludge.  No evidence of cholecystitis. 3. Normal pancreas. 4. Bibasilar atelectasis. 5. Imaging degraded by respiratory motion. Electronically Signed   By: Suzy Bouchard M.D.   On: 07/29/2021 19:59   MR 3D Recon At Scanner  Result Date: 07/29/2021 CLINICAL DATA:  Upper abdominal pain. Dilated common bile duct on ultrasound. EXAM: MRI ABDOMEN WITHOUT CONTRAST  (INCLUDING MRCP) TECHNIQUE: Multiplanar multisequence MR imaging of the abdomen was performed. Heavily T2-weighted images of the biliary and pancreatic ducts were obtained, and three-dimensional MRCP images were rendered by post processing. COMPARISON:  Ultrasound 07/29/2021 FINDINGS: Motion degradation exam. ICU patient. ICU  patients have difficulty following breath holding commands. Lower chest:  Small bilateral pleural effusions Hepatobiliary: No intrahepatic biliary duct dilatation. Common bile duct normal caliber. The no gallstones identified. Small amount sludge within the gallbladder. No gallbladder distension or inflammation. Pancreas: Normal pancreatic parenchymal intensity. No ductal dilatation or inflammation. Spleen: Normal spleen. Adrenals/urinary tract: Adrenal glands and kidneys are normal. Stomach/Bowel: Stomach and limited of the small bowel is unremarkable Vascular/Lymphatic: Abdominal aortic normal caliber. No retroperitoneal periportal lymphadenopathy. Musculoskeletal: No aggressive osseous lesion IMPRESSION: 1. Normal common bile duct. No intrahepatic biliary duct dilatation. 2. Small amount gallbladder sludge.  No evidence of cholecystitis. 3. Normal pancreas. 4. Bibasilar atelectasis. 5. Imaging degraded by respiratory motion. Electronically Signed   By: Suzy Bouchard M.D.   On: 07/29/2021 19:59   DG CHEST PORT 1 VIEW  Result Date: 07/29/2021 CLINICAL DATA:  Shortness of breath.  Chest pain.  Diaphoresis. EXAM: PORTABLE CHEST 1 VIEW COMPARISON:  07/29/2021 FINDINGS: Low lung volumes are present, causing crowding of the pulmonary vasculature. There is likely mild subsegmental atelectasis along both hemidiaphragms. Mild cardiomegaly. Prominence of the right upper mediastinal margin, cannot exclude adenopathy or nodule. IMPRESSION: 1. Low lung volumes and suspected mild atelectasis at the lung bases. 2. Somewhat nodular prominence the right upper mediastinum, cannot exclude adenopathy or nodule although this might possibly be from tortuous vasculature. Chest CT could be utilized for further characterization. 3. Mild cardiomegaly. Electronically Signed   By: Thayer Jew  Janeece Fitting M.D.   On: 07/29/2021 10:05   US Abdomen Limited RUQ (LIVER/GB)  Result Date: 07/29/2021 CLINICAL DATA:  Abnormal LFTs EXAM: ULTRASOUND  ABDOMEN LIMITED RIGHT UPPER QUADRANT COMPARISON:  CT abdomen and pelvis 07/28/2021 FINDINGS: Gallbladder: Moderately distended and contains echogenic calculi and sludge dependently. 5 mm echogenic likely polyp at the fundus. No significant wall thickening or pericholecystic fluid identified. Common bile duct: Diameter: 8 mm, upper normal for the patient's age. Liver: No focal lesion identified. Within normal limits in parenchymal echogenicity. Portal vein is patent on color Doppler imaging with normal direction of blood flow towards the liver. Other: None. IMPRESSION: 1. Cholelithiasis and sludge. 2. 5 mm likely polyp in the gallbladder. 3. Upper normal caliber of the common bile duct and moderate distention of the gallbladder. Correlate clinically and consider MRCP if indicated. Electronically Signed   By: Ofilia Neas M.D.   On: 07/29/2021 09:56   DG CHEST PORT 1 VIEW  Result Date: 07/29/2021 CLINICAL DATA:  Wheezing EXAM: PORTABLE CHEST 1 VIEW COMPARISON:  07/28/2021 FINDINGS: Limited low volume chest with generalized interstitial coarsening/crowding. No Kerley lines or pleural effusion. Normal heart size and mediastinal contours. Extensive artifact from EKG leads IMPRESSION: Limited low volume chest that is stable from yesterday Electronically Signed   By: Jorje Guild M.D.   On: 07/29/2021 06:48   DG Wrist Complete Left  Result Date: 07/29/2021 CLINICAL DATA:  Wrist pain EXAM: LEFT WRIST - COMPLETE 3+ VIEW COMPARISON:  None Available. FINDINGS: No fracture or malalignment. Vascular calcifications. Advanced joint space narrowing of the radiocarpal joint. No erosions. IMPRESSION: 1. 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 2. No acute osseous abnormality Electronically Signed   By: Donavan Foil M.D.   On: 07/29/2021 00:09   CT ABDOMEN PELVIS WO CONTRAST  Result Date: 07/28/2021 CLINICAL DATA:  Abdominal pain EXAM: CT ABDOMEN AND PELVIS WITHOUT CONTRAST TECHNIQUE: Multidetector CT imaging of the  abdomen and pelvis was performed following the standard protocol without IV contrast. RADIATION DOSE REDUCTION: This exam was performed according to the departmental dose-optimization program which includes automated exposure control, adjustment of the mA and/or kV according to patient size and/or use of iterative reconstruction technique. COMPARISON:  07/26/2021 FINDINGS: Lower chest: Small pleural effusions are noted bilaterally slightly increased when compared with the prior exam. Mild bibasilar atelectasis is seen. Hepatobiliary: Liver is within normal limits. Dependent gallstones are noted within the gallbladder which is well distended. Pneumobilia is again identified consistent with the given clinical history of sphincterotomy. Pancreas: Unremarkable. No pancreatic ductal dilatation or surrounding inflammatory changes. Spleen: Normal in size without focal abnormality. Adrenals/Urinary Tract: Adrenal glands are within normal limits bilaterally. Kidneys show no tiny nonobstructing renal stone on the right. No definitive stones are noted on the left. Hypodense lesion is noted within the left kidney measuring approximately 1.5 cm consistent with small cysts stable in appearance from the prior exam. No obstructive changes are seen. The bladder is well distended with mildly opacified urine. A portion of the bladder extends into a large right sided inguinal hernia. This is stable in appearance from the prior exam. Stomach/Bowel: Scattered fecal material is noted throughout the colon. Mild diverticular change is seen. The appendix has been surgically removed consistent with the given clinical history. Small bowel and stomach are unremarkable. Vascular/Lymphatic: Aortic atherosclerosis. No enlarged abdominal or pelvic lymph nodes. Reproductive: Prostate is unremarkable. Other: No ascites is noted. Bilateral fat containing inguinal hernias are seen. Persistent herniated bladder into the right hernia is noted.  Musculoskeletal:  Degenerative changes of lumbar spine are seen. IMPRESSION: Small nonobstructing right renal stone. Bilateral inguinal hernias with herniated bladder on the right stable from the prior study. Multiple small gallstones without complicating factors. No other focal abnormality is noted. Electronically Signed   By: Inez Catalina M.D.   On: 07/28/2021 23:58   DG Chest Port 1 View  Result Date: 07/28/2021 CLINICAL DATA:  Respiratory distress EXAM: PORTABLE CHEST 1 VIEW COMPARISON:  07/26/2021 FINDINGS: Hypoventilatory changes. No consolidation or effusion. Stable cardiomediastinal silhouette. No pneumothorax IMPRESSION: No active disease.  Low lung volumes. Electronically Signed   By: Donavan Foil M.D.   On: 07/28/2021 22:42   DG Chest Port 1 View  Result Date: 07/26/2021 CLINICAL DATA:  leukocytosis, back pain, poss PNA? EXAM: PORTABLE CHEST 1 VIEW COMPARISON:  Radiograph 08/01/2018 FINDINGS: Unchanged cardiomediastinal silhouette. Low lung volumes. No focal airspace disease. No pleural effusion. No pneumothorax. 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 IMPRESSION: Low lung volumes.  No focal airspace disease. High-riding humeral heads bilaterally can be seen in distal rotator cuff tendinopathy. Electronically Signed   By: Maurine Simmering M.D.   On: 07/26/2021 14:09   CT Abdomen Pelvis W Contrast  Result Date: 07/26/2021 CLINICAL DATA:  Abdominal pain, acute, nonlocalized dementia, prior abd surgery, back? pain, diaphoresis, concern for aorta vs. nephrolith EXAM: CT ABDOMEN AND PELVIS WITH CONTRAST TECHNIQUE: Multidetector CT imaging of the abdomen and pelvis was performed using the standard protocol following bolus administration of intravenous contrast. RADIATION DOSE REDUCTION: This exam was performed according to the departmental dose-optimization program which includes automated exposure control, adjustment of the mA and/or kV according to patient size and/or use  of iterative reconstruction technique. CONTRAST:  73m OMNIPAQUE IOHEXOL 300 MG/ML  SOLN COMPARISON:  CT 12/23/2015 FINDINGS: Lower chest: No acute abnormality. Hepatobiliary: No focal liver abnormality is seen. Mild gallbladder distension with layering sludge or tiny stones. There is pneumobilia consistent with history of prior ERCP and sphincterotomy. Pancreas: Unremarkable. No pancreatic ductal dilatation or surrounding inflammatory changes. Spleen: Normal in size without focal abnormality. Adrenals/Urinary Tract: Adrenal glands are unremarkable. No hydronephrosis or nephrolithiasis. There are nonobstructive bilateral renal stones. Unchanged bilateral renal cysts. There is a right inguinal hernia containing herniated bladder. Stomach/Bowel: The stomach is within normal limits. There is no evidence of bowel obstruction.Prior appendectomy. Scattered colonic diverticula. No diverticulitis. Moderate rectal stool burden. Vascular/Lymphatic: Aortoiliac atherosclerosis. No AAA. No lymphadenopathy. Reproductive: Unremarkable. Other: Right inguinal hernia containing hernia bladder. Fat containing left inguinal hernia. Unchanged anterior abdominal wall scarring. Musculoskeletal: Levoconvex upper lumbar and dextroconvex lower lumbar curvatures. 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 There is severe multilevel facet arthropathy. Mild bilateral hip osteoarthritis. IMPRESSION: Nonobstructive 3 mm renal stones bilaterally. No hydronephrosis or ureterolithiasis. Right inguinal hernia containing distended herniated bladder. Mild gallbladder is tension with layering sludge or small stones. No other findings to suggest cholecystitis by CT. 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 Aortoiliac atherosclerosis.  No AAA. Moderate rectal stool burden. Electronically Signed   By: JMaurine SimmeringM.D.   On: 07/26/2021 10:10    DOrson Eva DO  [facility]  If 7PM-7AM, please contact night-coverage [email or web address] Password [email or web address] 08/07/2021, 2:12 PM   LOS: 9 days

## 2021-08-07 NOTE — Assessment & Plan Note (Signed)
Venous duplex--neg In part due to third spacing with albumin <1.5

## 2021-08-08 ENCOUNTER — Inpatient Hospital Stay (HOSPITAL_COMMUNITY): Payer: PPO

## 2021-08-08 DIAGNOSIS — Z515 Encounter for palliative care: Secondary | ICD-10-CM | POA: Diagnosis not present

## 2021-08-08 DIAGNOSIS — G9341 Metabolic encephalopathy: Secondary | ICD-10-CM | POA: Diagnosis not present

## 2021-08-08 DIAGNOSIS — K810 Acute cholecystitis: Secondary | ICD-10-CM | POA: Diagnosis not present

## 2021-08-08 DIAGNOSIS — Z7189 Other specified counseling: Secondary | ICD-10-CM | POA: Diagnosis not present

## 2021-08-08 DIAGNOSIS — R7881 Bacteremia: Secondary | ICD-10-CM | POA: Diagnosis not present

## 2021-08-08 DIAGNOSIS — N179 Acute kidney failure, unspecified: Secondary | ICD-10-CM | POA: Diagnosis not present

## 2021-08-08 DIAGNOSIS — A419 Sepsis, unspecified organism: Secondary | ICD-10-CM | POA: Diagnosis not present

## 2021-08-08 LAB — URINALYSIS, COMPLETE (UACMP) WITH MICROSCOPIC
Bilirubin Urine: NEGATIVE
Glucose, UA: NEGATIVE mg/dL
Ketones, ur: 5 mg/dL — AB
Leukocytes,Ua: NEGATIVE
Nitrite: NEGATIVE
Protein, ur: NEGATIVE mg/dL
Specific Gravity, Urine: 1.02 (ref 1.005–1.030)
pH: 5 (ref 5.0–8.0)

## 2021-08-08 LAB — COMPREHENSIVE METABOLIC PANEL
ALT: <5 U/L (ref 0–44)
AST: 34 U/L (ref 15–41)
Albumin: <1.5 g/dL — ABNORMAL LOW (ref 3.5–5.0)
Alkaline Phosphatase: 125 U/L (ref 38–126)
Anion gap: 3 — ABNORMAL LOW (ref 5–15)
BUN: 33 mg/dL — ABNORMAL HIGH (ref 8–23)
CO2: 20 mmol/L — ABNORMAL LOW (ref 22–32)
Calcium: 7.3 mg/dL — ABNORMAL LOW (ref 8.9–10.3)
Chloride: 112 mmol/L — ABNORMAL HIGH (ref 98–111)
Creatinine, Ser: 0.97 mg/dL (ref 0.61–1.24)
GFR, Estimated: >60 mL/min (ref >60)
Glucose, Bld: 141 mg/dL — ABNORMAL HIGH (ref 70–99)
Potassium: 4.4 mmol/L (ref 3.5–5.1)
Sodium: 135 mmol/L (ref 135–145)
Total Bilirubin: 1 mg/dL (ref 0.3–1.2)
Total Protein: 5.9 g/dL — ABNORMAL LOW (ref 6.5–8.1)

## 2021-08-08 LAB — GLUCOSE, CAPILLARY
Glucose-Capillary: 110 mg/dL — ABNORMAL HIGH (ref 70–99)
Glucose-Capillary: 155 mg/dL — ABNORMAL HIGH (ref 70–99)
Glucose-Capillary: 87 mg/dL (ref 70–99)
Glucose-Capillary: 92 mg/dL (ref 70–99)

## 2021-08-08 LAB — BLOOD GAS, VENOUS
Acid-base deficit: 2.3 mmol/L — ABNORMAL HIGH (ref 0.0–2.0)
Bicarbonate: 20.3 mmol/L (ref 20.0–28.0)
Drawn by: 6000
O2 Saturation: 97.7 %
Patient temperature: 37.3
pCO2, Ven: 26 mmHg — ABNORMAL LOW (ref 44–60)
pH, Ven: 7.5 — ABNORMAL HIGH (ref 7.25–7.43)
pO2, Ven: 71 mmHg — ABNORMAL HIGH (ref 32–45)

## 2021-08-08 LAB — LACTIC ACID, PLASMA
Lactic Acid, Venous: 1.1 mmol/L (ref 0.5–1.9)
Lactic Acid, Venous: 1 mmol/L (ref 0.5–1.9)

## 2021-08-08 LAB — CBC
HCT: 24.7 % — ABNORMAL LOW (ref 39.0–52.0)
Hemoglobin: 7.9 g/dL — ABNORMAL LOW (ref 13.0–17.0)
MCH: 31.5 pg (ref 26.0–34.0)
MCHC: 32 g/dL (ref 30.0–36.0)
MCV: 98.4 fL (ref 80.0–100.0)
Platelets: 485 10*3/uL — ABNORMAL HIGH (ref 150–400)
RBC: 2.51 MIL/uL — ABNORMAL LOW (ref 4.22–5.81)
RDW: 14.6 % (ref 11.5–15.5)
WBC: 18.1 10*3/uL — ABNORMAL HIGH (ref 4.0–10.5)
nRBC: 0 % (ref 0.0–0.2)

## 2021-08-08 LAB — CREATININE, SERUM
Creatinine, Ser: 0.91 mg/dL (ref 0.61–1.24)
GFR, Estimated: >60 mL/min (ref >60)

## 2021-08-08 LAB — AMMONIA: Ammonia: 14 umol/L (ref 9–35)

## 2021-08-08 LAB — PROCALCITONIN: Procalcitonin: 1.35 ng/mL

## 2021-08-08 LAB — MAGNESIUM: Magnesium: 1.8 mg/dL (ref 1.7–2.4)

## 2021-08-08 MED ORDER — NOREPINEPHRINE 4 MG/250ML-% IV SOLN
0.0000 ug/min | INTRAVENOUS | Status: DC
  Administered 2021-08-08 – 2021-08-09 (×2): 2 ug/min via INTRAVENOUS
  Filled 2021-08-08: qty 250

## 2021-08-08 MED ORDER — NOREPINEPHRINE 4 MG/250ML-% IV SOLN
INTRAVENOUS | Status: AC
  Filled 2021-08-08: qty 250

## 2021-08-08 MED ORDER — VANCOMYCIN HCL 750 MG/150ML IV SOLN
750.0000 mg | Freq: Two times a day (BID) | INTRAVENOUS | Status: DC
  Administered 2021-08-09 – 2021-08-11 (×5): 750 mg via INTRAVENOUS
  Filled 2021-08-08 (×5): qty 150

## 2021-08-08 MED ORDER — SODIUM CHLORIDE 0.9 % IV SOLN: INTRAVENOUS | Status: DC

## 2021-08-08 MED ORDER — MEROPENEM 1 G IV SOLR
1000.0000 mg | Freq: Three times a day (TID) | INTRAVENOUS | Status: DC
  Administered 2021-08-08 – 2021-08-11 (×8): 1000 mg via INTRAVENOUS
  Filled 2021-08-08 (×8): qty 20

## 2021-08-08 MED ORDER — VANCOMYCIN HCL 1750 MG/350ML IV SOLN
1750.0000 mg | Freq: Once | INTRAVENOUS | Status: AC
  Administered 2021-08-08: 1750 mg via INTRAVENOUS
  Filled 2021-08-08: qty 350

## 2021-08-08 NOTE — Evaluation (Signed)
Physical Therapy Evaluation Patient Details Name: Elijah [name] MRN: [id number] DOB: [birth year] Today's Date: 08/08/2021  History of Present Illness  Elijah [name] is a 81 y.o. male with medical history significant of history of chronic low back pain, diabetes mellitus type 2, hyperlipidemia, hypertension, Parkinson disease, obstructive sleep apnea, history of biliary obstruction and pancreatitis with ERCP in 2018, presents ED with chief complaint of dyspnea.  At time of presentation wife was at bedside.  She reported to the ED provider that patient has had 24 hours of shortness of breath and increased work of breathing.  He was recently seen in the ED 2 days ago for back pain, which was acute on chronic.  They had denied fevers at home, but wife did report the patient had left arm pain when she was trying to lift him up.  ED provider note reports that patient denied current abdominal pain.  Patient is not answering questions for me, but does have voluntary guarding with palpation of his abdomen.  Patient arrived in the ER satting in the low 90s on 4 L nasal cannula.  He was put on nonrebreather for work of breathing.  Sepsis protocol was started.  Patient was treated with vancomycin, cefepime, Flagyl, and given a 3 L bolus.  Lactic acid was significantly elevated 6.3, repeat 4.6.  Chest x-ray was negative for acute disease, CT abdomen pelvis did not given definitive explanation of his symptoms either.  Urine was borderline.  Patient did have an AKI.  Admission was requested for further work-up of altered mental status, dyspnea, and most likely sepsis.   Clinical Impression  Patient presents supine in bed with R torticollis presentation with wife present and consents to PT evaluation. Patient was lethargic during evaluation impacting mobility and transfers during evaluation. Patient is max assist with bed mobility primarily due to lack of trunk control. Patient had difficulty with LE and UE initiation  needed for push off and coordination to complete task. Sit to stand transfer was attempted with max assist and use of RW. Patient unable to initiate LE push off during transfer due to generalized weakness and coordination. Patient was max assist with sit to supine with PT facilitating manual trunk movement to place patient center of bed. Patient left supine in bed with wife in room and nursing staff notified of mobility status. Patient will benefit from continued skilled physical therapy in hospital and recommended venue below to increase strength, balance, endurance for safe ADLs and gait.      Recommendations for follow up therapy are one component of a multi-disciplinary discharge planning process, led by the attending physician.  Recommendations may be updated based on patient status, additional functional criteria and insurance authorization.  Follow Up Recommendations Skilled nursing-short term rehab (<3 hours/day)    Assistance Recommended at Discharge PRN  Patient can return home with the following  A lot of help with walking and/or transfers;Assistance with cooking/housework;Assist for transportation;Help with stairs or ramp for entrance;A lot of help with bathing/dressing/bathroom    Equipment Recommendations None recommended by PT  Recommendations for Other Services       Functional Status Assessment Patient has had a recent decline in their functional status and demonstrates the ability to make significant improvements in function in a reasonable and predictable amount of time.     Precautions / Restrictions Precautions Precautions: Fall Restrictions Weight Bearing Restrictions: No      Mobility  Bed Mobility Overal bed mobility: Needs Assistance Bed Mobility: Supine to Sit  Supine to sit: Max assist     General bed mobility comments: Max assist for bed mobility. Difficutly with trunk control, LE initiation, and pressing up with B UE.    Transfers Overall  transfer level: Needs assistance Equipment used: Rolling walker (2 wheels) Transfers: Sit to/from Stand Sit to Stand: Max assist           General transfer comment: STS attempted with RW and max assist. Unable to initate LE push off due to generalized weakness and decreased coordination.    Ambulation/Gait                  Stairs            Wheelchair Mobility    Modified Rankin (Stroke Patients Only)       Balance Overall balance assessment: Needs assistance Sitting-balance support: Bilateral upper extremity supported, Feet supported Sitting balance-Leahy Scale: Poor Sitting balance - Comments: Poor sitting balance at EOB with limited UE support. Can hold briefly but slouched posture results in left lateral lean Postural control: Left lateral lean                                   Pertinent Vitals/Pain      Home Living Family/patient expects to be discharged to:: Private residence Living Arrangements: Spouse/significant other Available Help at Discharge: Family;Available PRN/intermittently;Available 24 hours/day Type of Home: House Home Access: Stairs to enter   [employer] of Steps: 1   Home Layout: One level Home Equipment: Conservation officer, nature (2 wheels);Rollator (4 wheels);Wheelchair - manual      Prior Function Prior Level of Function : Needs assist       Physical Assist : ADLs (physical);Mobility (physical) Mobility (physical): Transfers ADLs (physical): IADLs Mobility Comments: Patient able to ambulat at home independently with rollator and is a limited community ambulator according to wife. Wife reports patient needs help with transfers and mobility overall. Patient is not currently driving as wife states she is the one who drives. ADLs Comments: Patient needs assist with most ADL's and functional tasks according to wife.     Hand Dominance   Dominant Hand: Left    Extremity/Trunk Assessment   Upper Extremity  Assessment Upper Extremity Assessment: Generalized weakness    Lower Extremity Assessment Lower Extremity Assessment: Generalized weakness    Cervical / Trunk Assessment Cervical / Trunk Assessment: Normal  Communication   Communication: Other (comment) (AOX1 and very lethargic)  Cognition Arousal/Alertness: Lethargic Behavior During Therapy: Flat affect Overall Cognitive Status: Difficult to assess                                 General Comments: AOx1 and very lethargic. Wife present to help with history and information        General Comments      Exercises     Assessment/Plan    PT Assessment Patient needs continued PT services  PT Problem List Decreased strength;Decreased activity tolerance;Decreased balance;Decreased mobility;Decreased coordination       PT Treatment Interventions DME instruction;Gait training;Functional mobility training;Therapeutic activities;Therapeutic exercise;Balance training    PT Goals (Current goals can be found in the Care Plan section)  Acute Rehab PT Goals Patient Stated Goal: return home PT Goal Formulation: With patient Time For Goal Achievement: 08/22/21 Potential to Achieve Goals: Fair    Frequency Min 3X/week     Co-evaluation  AM-PAC PT "6 Clicks" Mobility  Outcome Measure Help needed turning from your back to your side while in a flat bed without using bedrails?: A Lot Help needed moving from lying on your back to sitting on the side of a flat bed without using bedrails?: A Lot Help needed moving to and from a bed to a chair (including a wheelchair)?: A Lot Help needed standing up from a chair using your arms (e.g., wheelchair or bedside chair)?: A Lot Help needed to walk in hospital room?: Total Help needed climbing 3-5 steps with a railing? : Total 6 Click Score: 10    End of Session   Activity Tolerance: No increased pain;Patient limited by fatigue;Patient tolerated treatment  well;Patient limited by lethargy Patient left: in bed;with family/visitor present;with call bell/phone within reach Nurse Communication: Mobility status PT Visit Diagnosis: Unsteadiness on feet (R26.81);Other abnormalities of gait and mobility (R26.89);Muscle weakness (generalized) (M62.81)    Time: 2334-3568 PT Time Calculation (min) (ACUTE ONLY): 29 min   Charges:   PT Evaluation $PT Eval Moderate Complexity: 1 Mod PT Treatments $Therapeutic Activity: 23-37 mins        2:03 PM, 08/08/21 Lestine Box, S/PT

## 2021-08-08 NOTE — Assessment & Plan Note (Signed)
Discussed with spouse at length --discussed patient's overall poor prognosis in setting of multiple co-morbidities and his poor quality of life -discussed his clinical worsening 6/19 despite optimal treatment -spouse expressed that she knows he's suffering with no quality of life -spouse stated that pt told her "I'm ready to go home' -agrees to DNR

## 2021-08-08 NOTE — Progress Notes (Signed)
Palliative: Mr. Morici is lying quietly in bed.  He appears acutely/chronically ill and frail.  He does not respond in any meaningful way to voice or touch.  His wife, Vaughan Basta, is present at bedside.  Vaughan Basta shares that [name] was more alert and eating well on Friday and Saturday.  She shares that he did not do as well yesterday.  We talk about PT evaluation for possible discharge to short-term rehab.  Vaughan Basta is tearful at times during our conversation.  I asked her about what worries her the most for Mr. Spalla.  She shares, "I know he will never be able to return home".  She shares that over the weekend he was talking to people who were not present, but not loved ones who have passed.  I shared that the palliative medicine team is available for family meeting tomorrow if they so desire.  Conference with bedside nursing staff and transition of care team related to patient condition, needs, goals of care, disposition.  Plan:    Continue full scope/full code.  Time for outcomes.  PT evaluation for short-term rehab.   35 minutes  Quinn Axe, NP Palliative medicine team Team phone [phone] Greater than 50% of this time was spent counseling and coordinating care related to the above assessment and plan.

## 2021-08-08 NOTE — Progress Notes (Signed)
Bipap is PRN.  Patient is on RA with sat of 96%.

## 2021-08-08 NOTE — Progress Notes (Signed)
Pharmacy Antibiotic Note  Elijah [name] is a 81 y.o. male admitted on 07/28/2021 with sepsis.  Pharmacy has been consulted for Vancomycin dosing. +Strep agalactiae bacteremia since 6/8. Repeat cxs 6/15 showing 1 BCX + staph epi, likely contaminant, otherwise ngtd. Patient also with acute cholecystitis and aspiration pneumonitis. Bacteremia likely source is gallbladder. AF, PCT 2.99, WBC 18.1.   Plan: Vancomycin '1750mg'$  IV loading dose, then 750 mg IV Q 12 hrs. Goal AUC 400-550. Expected AUC: 515 SCr used: 0.91  Also on ceftriaxone 2gm IV q24h F/U cxs and clinical progress Monitor V/S, labs and levels as indicated  Height: '5\' 10"'$  (177.8 cm) Weight: 94.2 kg (207 lb 10.8 oz) IBW/kg (Calculated) : 73  Temp (24hrs), Avg:98.9 F (37.2 C), Min:98.4 F (36.9 C), Max:99.8 F (37.7 C)  Recent Labs  Lab 08/04/21 0406 08/04/21 0616 08/05/21 0327 08/06/21 0353 08/07/21 0443 08/07/21 1426 08/08/21 0339  WBC 14.9*  --  16.5* 16.7* 16.5*  --  18.1*  CREATININE 1.25*   < > 1.22 1.02 0.92 0.89 0.91   < > = values in this interval not displayed.    Estimated Creatinine Clearance: 77.1 mL/min (by C-G formula based on SCr of 0.91 mg/dL).    Allergies  Allergen Reactions   Aspirin Other (See Comments)    Stomach ulcers.   Coconut (Cocos Nucifera)     Other reaction(s): GI Upset (intolerance)   Penicillins Rash    Has patient had a PCN reaction causing immediate rash, facial/tongue/throat swelling, SOB or lightheadedness with hypotension: no Has patient had a PCN reaction causing severe rash involving mucus membranes or skin necrosis: No Has patient had a PCN reaction that required hospitalization No Has patient had a PCN reaction occurring within the last 10 years: No If all of the above answers are "NO", then may proceed with Cephalosporin use.     Antimicrobials this admission: Cefepime 6/9>> 6/11 Ceftriaxone 6/11>> Flagyl 6/9>> Vanc 6/8 x1 dose, restarted 6/19>>  Microbiology  results: 6/8 BCX: grp B strep => strep agalactiae pan sensitive 6/8 UCX: no growth 6/15 BCX: ngtd, 1 bottle staph epi(contaminant) 6/9 MRSA PCR is negative  Thank you for allowing pharmacy to be a part of this patient's care.  Isac Sarna, BS Pharm D, BCPS Clinical Pharmacist 08/08/2021 3:19 PM

## 2021-08-08 NOTE — NC FL2 (Deleted)
[place] LEVEL OF CARE SCREENING TOOL     IDENTIFICATION  Patient Name: Elijah [name] Birthdate: 1940/06/21 Sex: male Admission Date (Current Location): 07/28/2021  [facility] and [place] Number:  [employer] and Address:  [place] [address] [place]      Provider Number: [id number]  Attending Physician Name and Address:  Orson Eva, MD  Relative Name and Phone Number:       Current Level of Care: Hospital Recommended Level of Care: [place] Prior Approval Number:    Date Approved/Denied:   PASRR Number: [id number]  Discharge Plan: SNF    Current Diagnoses: Patient Active Problem List   Diagnosis Date Noted   Arm edema 08/07/2021   Hypernatremia 08/02/2021   Acute cholecystitis    Thrombocytopenia ([place]) 07/31/2021   AKI (acute kidney injury) ([place]) 07/29/2021   Severe sepsis ([place]) 07/29/2021   Acute respiratory failure with hypoxia ([place]) 07/29/2021   Lactic acidosis 07/29/2021   HLD (hyperlipidemia) 16/38/4665   Acute metabolic encephalopathy 99/35/7017   Bacteremia due to group B Streptococcus    S/P ERCP    Biliary obstruction    Pancreatitis, acute    Choledocholithiasis    Total bilirubin, elevated    Elevated LFTs    Pancreatitis 09/02/2015   Parkinson disease ([place])    Diabetes mellitus without complication ([place])    Hypertension    Essential hypertension    Jaundice    Chronic low back pain 03/24/2015   OSA (obstructive sleep apnea) 04/10/2014   Paralysis agitans ([place]) 10/07/2012    Orientation RESPIRATION BLADDER Height & Weight     Self  Normal External catheter Weight: 207 lb 10.8 oz (94.2 kg) Height:  '5\' 10"'$  (177.8 cm)  BEHAVIORAL SYMPTOMS/MOOD NEUROLOGICAL BOWEL NUTRITION STATUS      Incontinent    AMBULATORY STATUS COMMUNICATION OF NEEDS Skin   Extensive Assist Verbally Skin abrasions, Bruising, Other (Comment) (Redness/rash to groin. Weeping left arm.)                        Personal Care Assistance Level of Assistance  Bathing, Feeding, Dressing Bathing Assistance: Maximum assistance Feeding assistance: Limited assistance Dressing Assistance: Maximum assistance     Functional Limitations Info  Sight, Hearing, Speech Sight Info: Adequate Hearing Info: Adequate Speech Info: Adequate    SPECIAL CARE FACTORS FREQUENCY  PT (By licensed PT)     PT Frequency: 5x weekly              Contractures      Additional Factors Info  Code Status, Allergies, Psychotropic Code Status Info: Full code Allergies Info: Aspirin, Coconut (cocos nucifera), Penicillins Psychotropic Info: Lexapro, Ativan, Zyprexa, Seroquel         Current Medications (08/08/2021):  This is the current hospital active medication list Current Facility-Administered Medications  Medication Dose Route Frequency Provider Last Rate Last Admin   0.45 % sodium chloride infusion   Intravenous Continuous Tat, David, MD 50 mL/hr at 08/08/21 1129 New Bag at 08/08/21 1129   acetaminophen (TYLENOL) tablet 650 mg  650 mg Oral Q6H PRN Zierle-Ghosh, Asia B, DO   650 mg at 08/04/21 2117   Or   acetaminophen (TYLENOL) suppository 650 mg  650 mg Rectal Q6H PRN Zierle-Ghosh, Asia B, DO   650 mg at 07/29/21 2332   aspirin suppository 300 mg  300 mg Rectal Daily Tat, David, MD   300 mg at 08/08/21  3845   bisacodyl (DULCOLAX) suppository 10 mg  10 mg Rectal Daily Tat, David, MD   10 mg at 08/08/21 0842   budesonide (PULMICORT) nebulizer solution 0.5 mg  0.5 mg Nebulization BID Barton Dubois, MD   0.5 mg at 08/08/21 0739   Carbidopa-Levodopa ER (SINEMET CR) 25-100 MG tablet controlled release 1 tablet  1 tablet Oral Q supper Zierle-Ghosh, Asia B, DO   1 tablet at 08/07/21 1725   Carbidopa-Levodopa ER (SINEMET CR) 25-100 MG tablet controlled release 2 tablet  2 tablet Oral TID Zierle-Ghosh, Asia B, DO   2 tablet at 08/08/21 1125   cefTRIAXone (ROCEPHIN) 2 g in sodium chloride 0.9 % 100 mL IVPB   2 g Intravenous Q24H Barton Dubois, MD 200 mL/hr at 08/07/21 1940 2 g at 08/07/21 1940   Chlorhexidine Gluconate Cloth 2 % PADS 6 each  6 each Topical Q0600 Barton Dubois, MD   6 each at 08/08/21 0555   heparin injection 5,000 Units  5,000 Units Subcutaneous Franco Collet, MD   5,000 Units at 08/08/21 1354   HYDROcodone-acetaminophen (NORCO/VICODIN) 5-325 MG per tablet 1 tablet  1 tablet Oral Q6H PRN Tat, Shanon Brow, MD   1 tablet at 08/06/21 2346   ipratropium-albuterol (DUONEB) 0.5-2.5 (3) MG/3ML nebulizer solution 3 mL  3 mL Nebulization Q6H PRN Barton Dubois, MD   3 mL at 07/31/21 2033   labetalol (NORMODYNE) injection 5 mg  5 mg Intravenous Q6H PRN Adefeso, Oladapo, DO       metoprolol tartrate (LOPRESSOR) tablet 12.5 mg  12.5 mg Oral BID Tat, David, MD   12.5 mg at 08/08/21 0841   ondansetron (ZOFRAN) tablet 4 mg  4 mg Oral Q6H PRN Zierle-Ghosh, Asia B, DO       Or   ondansetron (ZOFRAN) injection 4 mg  4 mg Intravenous Q6H PRN Zierle-Ghosh, Asia B, DO       polyethylene glycol (MIRALAX / GLYCOLAX) packet 17 g  17 g Oral Daily Tat, David, MD   17 g at 08/08/21 0841   sodium chloride flush (NS) 0.9 % injection 10-40 mL  10-40 mL Intracatheter Q12H Tat, Shanon Brow, MD   10 mL at 08/08/21 0842   sodium chloride flush (NS) 0.9 % injection 10-40 mL  10-40 mL Intracatheter PRN Tat, David, MD       sodium chloride flush (NS) 0.9 % injection 5-10 mL  5-10 mL Intracatheter Q8H Boisseau, Hayley, PA   10 mL at 08/08/21 1354     Discharge Medications: Please see discharge summary for a list of discharge medications.  Relevant Imaging Results:  Relevant Lab Results:   Additional Information SSN: [id number].  Salome Arnt, LCSW

## 2021-08-08 NOTE — Progress Notes (Addendum)
PROGRESS NOTE  Elijah [name] TOI:[id number] DOB: 1940-04-20 DOA: 07/28/2021 PCP: Celene Squibb, MD  Brief History:  As per H&P written by Dr.Zierle-Ghosh On 07/29/2021 Elijah [name] is a 81 y.o. male with medical history significant of history of chronic low back pain, diabetes mellitus type 2, hyperlipidemia, hypertension, Parkinson disease, obstructive sleep apnea, history of biliary obstruction and pancreatitis with ERCP in 2018, presents ED with chief complaint of dyspnea.  At time of presentation wife was at bedside.  She reported to the ED provider that patient has had 24 hours of shortness of breath and increased work of breathing.  He was recently seen in the ED 2 days ago for back pain, which was acute on chronic.  They had denied fevers at home, but wife did report the patient had left arm pain when she was trying to lift him up.  ED provider note reports that patient denied current abdominal pain.  Patient is not answering questions for me, but does have voluntary guarding with palpation of his abdomen.  Patient arrived in the ER satting in the low 90s on 4 L nasal cannula.  He was put on nonrebreather for work of breathing.  Sepsis protocol was started.  Patient was treated with vancomycin, cefepime, Flagyl, and given a 3 L bolus.  Lactic acid was significantly elevated 6.3, repeat 4.6.  Chest x-ray was negative for acute disease, CT abdomen pelvis did not given definitive explanation of his symptoms either.  Urine was borderline.  Patient did have an AKI.  Admission was requested for further work-up of altered mental status, dyspnea, and sepsis.   Patient was found to have GBS bacteremia with source as acute cholecystitis.  He was not a surgical candidate.  IR placed cholecystotomy tube on 6/13.  His IV antibiotics were continued.  Echo (TTE) was neg for vegetations.  His IVF was adjusted for his hypernatremia and his hydromorphone dosing was decreased with some improvement of his  encephalopathy. Pt gradually improved clinically with improved BP, improved mentation, improved renal function On 08/08/21, patient was more somnolent and became hypotensive.  His antibiotics were broadened.  Further goals of care discussion was held with spouse at bedside and patient was transitioned to DNR.   Assessment and Plan: * Severe sepsis (HCC) -Heart rate 100, respiratory rate 30-44, lactic acidosis 6.3, AKI 2.24 -Bacteremia, acute cholecystitis and aspiration pneumonitis -Continue IV Rocephin -WBC's trending down after chole tube -Off BPAP and with good saturation demonstrated on 4-5 L [place] supplementation with great saturation. -COVID and flu are negative  Bacteremia due to group B Streptococcus 6/15 Repeat blood culture--GPC 1 of 2 sets Echo--EF 70-75%, no WMA, G1DD, no vegetation, trivial MR, mild TR Source - Gallbladder Repeat blood cultures--contaminant   Acute cholecystitis -as mentioned, patient not a candidate for surgery - IR placed cholecystostomy tube 08/02/21 -Continue supportive care and IV antibiotics.  AKI (acute kidney injury) ([place]) -Creatinine normally in the 0.6-0.8 range at baseline; at time of admission 2.24 and peaked at 2.72; now down to 1.36.>>1.22>>1.02>>0.92 -due to volume depletion and sepsis/hemodynamic changes  Thrombocytopenia (HCC) -due to sepsis -Platelets count nadir 70sK -improving with sepsis tx -restart heparin [place]  Acute respiratory failure with hypoxia ([place]) -Patient with good saturation on 1-2 L. -Patient had increasing work of breathing to the point of requiring transient use of BiPAP at time of admission. -Continue current IV antibiotics -now stable on RA  Arm edema Venous duplex  Hypernatremia - Na up to 158>>>137 -d/c D5W -start 1/2 NS  Acute metabolic encephalopathy -Baseline>>pleasant confusion with hallucinations -Patient with underlying history of mood disorder/dementia and Parkinson. -Continue to be  lethargic--but slowly improving -6/11 CT head--neg -ammonia = 41>>22 -6/11 ABG 7.45/32/77/22 on 2L -Palliative care consulted. -remains encephalopathic on 6/18, but more awake and occasionally follows commands -d/c hydromorphone>>>start norco  HLD (hyperlipidemia) -initially held due to poor mentation and elevated LFTs -Holding statin for now.  Lactic acidosis -Secondary to sepsis -Initial lactic acid 6.3, repeat 4.6>> last one 1.1 -Continue to maintain adequate IV hydration  Total bilirubin, elevated -history of biliary obstruction and ERCP back in 2018 -CT abd--bilateral inguinal hernias, herniated bladder on the right, small nonobstructing stone, and gallstones without complication -MRCP--no retained CBD stones or any masses in his liver. -LFTs has continued trending down overall. -Continue IV antibiotics -After discussing with general surgery>>check HIDA scan; which demonstrated positive result for acute cholecystitis and cystic duct obstruction. -not a candidate for cholecystectomy/surgical intervention. -6/13--cholecystotomy tube  Essential hypertension -Continue holding ARB and  HCTZ due to AKI -Blood pressure has now stabilized -change lopressor IV to po metoprolol -BP and HR now controlled.   Diabetes mellitus without complication ([place]) -holding metformin -monitor glucose with CBGs and if it starts trending up will add insulin coverage. -so far CBG's are stable. -6/16 A1C--6.7  Parkinson disease ([place]) -pt on Sinemet when able to take by mouth, but poor mentation precludes po intake initially -Patient with underlying mood disorder and mild hallucinations/psychosis as per patient's wife reports>>>worse over past 6-12 months at baseline -Unable to place NG tube on 07/30/21 (very traumatic and never able to passed from his neck). -now more awake -speech eval>>dys 2 with thin  Family Communication:   spouse updated 6/19   Consultants:  palliative   Code Status:   DNR   DVT Prophylaxis:  Heparin [place]     Procedures: As Listed in Progress Note Above   Antibiotics: Ceftriaxone 6/11>>6/19 Cefepime 6/8>>6/11 Vanc 6/19>> Merrem 6/19>>             Subjective: Patient is somnolent, not answering questions.  No vomiting.  No diarrhea.  No resp distress.  Objective: Vitals:   08/08/21 1500 08/08/21 1530 08/08/21 1600 08/08/21 1630  BP: (!) 111/22 (!) 126/24 (!) 122/29 (!) 137/29  Pulse: 72 81 80 73  Resp: (!) 33 (!) 29 (!) 35 (!) 26  Temp:      TempSrc:      SpO2: 97% 96% 96% 95%  Weight:      Height:        Intake/Output Summary (Last 24 hours) at 08/08/2021 1647 Last data filed at 08/08/2021 1609 Gross per 24 hour  Intake 1671.38 ml  Output 930 ml  Net 741.38 ml   Weight change: 0.3 kg Exam:  General:  Pt is somnolent, does not follow commands appropriately, not in acute distress HEENT: No icterus, No thrush, No neck mass, [place]/AT Cardiovascular: RRR, S1/S2, no rubs, no gallops Respiratory: bibasilar rales.  No wheeze Abdomen: Soft/+BS, non tender, non distended, no guarding Extremities: 2 + LE edema, No lymphangitis, No petechiae, No rashes, no synovitis   Data Reviewed: I have personally reviewed following labs and imaging studies Basic Metabolic Panel: Recent Labs  Lab 08/04/21 0406 08/04/21 0616 08/05/21 0327 08/06/21 0353 08/07/21 0443 08/07/21 1426 08/08/21 0339  NA 151* 159* 154* 145 137 132*  --   K 3.7 4.3 4.1 4.0 4.1 4.3  --   CL 127* >  130* 128* 120* 112* 110  --   CO2 '22 23 23 23 '$ 21* 18*  --   GLUCOSE 571* 191* 203* 263* 237* 233*  --   BUN 54* 60* 52* 34* 28* 27*  --   CREATININE 1.25* 1.36* 1.22 1.02 0.92 0.89 0.91  CALCIUM 6.9* 7.8* 7.5* 7.1* 7.1* 7.2*  --   MG 2.2  --  2.3  --  1.8  --  1.8   Liver Function Tests: Recent Labs  Lab 08/03/21 0353 08/07/21 1426  AST 23 35  ALT 22 <5  ALKPHOS 131* 117  BILITOT 2.1* 1.3*  PROT 5.7* 6.2*  ALBUMIN 1.9* 1.6*   No results for input(s): "LIPASE",  "AMYLASE" in the last 168 hours. Recent Labs  Lab 08/05/21 0327 08/08/21 1530  AMMONIA 22 14   Coagulation Profile: No results for input(s): "INR", "PROTIME" in the last 168 hours. CBC: Recent Labs  Lab 08/04/21 0406 08/05/21 0327 08/06/21 0353 08/07/21 0443 08/08/21 0339  WBC 14.9* 16.5* 16.7* 16.5* 18.1*  HGB 9.0* 9.4* 8.6* 8.2* 7.9*  HCT 28.7* 30.5* 27.0* 25.0* 24.7*  MCV 101.1* 100.7* 98.5 96.5 98.4  PLT 191 270 327 386 485*   Cardiac Enzymes: No results for input(s): "CKTOTAL", "CKMB", "CKMBINDEX", "TROPONINI" in the last 168 hours. BNP: Invalid input(s): "POCBNP" CBG: Recent Labs  Lab 08/07/21 1126 08/08/21 0040 08/08/21 0604 08/08/21 1116 08/08/21 1542  GLUCAP 200* 155* 87 92 110*   HbA1C: No results for input(s): "HGBA1C" in the last 72 hours. Urine analysis:    Component Value Date/Time   COLORURINE AMBER (A) 07/29/2021 1000   APPEARANCEUR CLOUDY (A) 07/29/2021 1000   LABSPEC 1.023 07/29/2021 1000   PHURINE 5.0 07/29/2021 1000   GLUCOSEU 50 (A) 07/29/2021 1000   HGBUR SMALL (A) 07/29/2021 1000   BILIRUBINUR NEGATIVE 07/29/2021 1000   KETONESUR 5 (A) 07/29/2021 1000   PROTEINUR 30 (A) 07/29/2021 1000   UROBILINOGEN 0.2 10/14/2009 1851   NITRITE NEGATIVE 07/29/2021 1000   LEUKOCYTESUR NEGATIVE 07/29/2021 1000   Sepsis Labs: '@LABRCNTIP'$ (procalcitonin:4,lacticidven:4) ) Recent Results (from the past 240 hour(s))  Aerobic/Anaerobic Culture w Gram Stain (surgical/deep wound)     Status: Abnormal   Collection Time: 08/02/21  2:25 PM   Specimen: Gallbladder; Bile  Result Value Ref Range Status   Specimen Description   Final    GALL BLADDER Performed at [facility], [address], [place], [place] [address]    Special Requests   Final    NONE Performed at [facility], [address], [place], [place] [address]    Gram Stain   Final    NO WBC SEEN FEW GRAM POSITIVE COCCI IN PAIRS FEW GRAM NEGATIVE RODS MODERATE BRANCHING, BEADING  GRAM  POSITIVE RODS    Culture (A)  Final    MULTIPLE ORGANISMS PRESENT, NONE PREDOMINANT NO GROUP A STREP (S.PYOGENES) ISOLATED NO STAPHYLOCOCCUS AUREUS ISOLATED NO ANAEROBES ISOLATED Performed at [facility], [place] [address], [place], [place] [address]    Report Status 08/07/2021 FINAL  Final  Culture, blood (Routine X 2) w Reflex to ID Panel     Status: Abnormal   Collection Time: 08/04/21  4:06 AM   Specimen: Right Antecubital; Blood  Result Value Ref Range Status   Specimen Description   Final    RIGHT ANTECUBITAL Performed at [facility], [address], [place], [place] [address]    Special Requests   Final    BOTTLES DRAWN AEROBIC AND ANAEROBIC Blood Culture adequate volume  Performed at [facility], [address] [place], [place] [address]    Culture  Setup Time   Final    GRAM POSITIVE COCCI ANAEROBIC BOTTLE ONLY Gram Stain Report Called to,Read Back By and Verified With: MCGIBBONY @ [id number] ON [id number] BY HENDERSON L CRITICAL RESULT CALLED TO, READ BACK BY AND VERIFIED WITH: RN Ralph Dowdy [phone] '@1928'$  FH    Culture (A)  Final    STAPHYLOCOCCUS EPIDERMIDIS THE SIGNIFICANCE OF ISOLATING THIS ORGANISM FROM A SINGLE SET OF BLOOD CULTURES WHEN MULTIPLE SETS ARE DRAWN IS UNCERTAIN. PLEASE NOTIFY THE MICROBIOLOGY DEPARTMENT WITHIN ONE WEEK IF SPECIATION AND SENSITIVITIES ARE REQUIRED. Performed at [facility], [place] [address], [place], [place] [address]    Report Status 08/07/2021 FINAL  Final  Blood Culture ID Panel (Reflexed)     Status: Abnormal   Collection Time: 08/04/21  4:06 AM  Result Value Ref Range Status   Enterococcus faecalis NOT DETECTED NOT DETECTED Final   Enterococcus Faecium NOT DETECTED NOT DETECTED Final   Listeria monocytogenes NOT DETECTED NOT DETECTED Final   Staphylococcus species DETECTED (A) NOT DETECTED Final    Comment: CRITICAL RESULT CALLED TO, READ BACK BY AND VERIFIED WITH: RN Ralph Dowdy [phone] '@1928'$  FH    Staphylococcus aureus (BCID)  NOT DETECTED NOT DETECTED Final   Staphylococcus epidermidis DETECTED (A) NOT DETECTED Final    Comment: Methicillin (oxacillin) resistant coagulase negative staphylococcus. Possible blood culture contaminant (unless isolated from more than one blood culture draw or clinical case suggests pathogenicity). No antibiotic treatment is indicated for blood  culture contaminants. CRITICAL RESULT CALLED TO, READ BACK BY AND VERIFIED WITH: RN Ralph Dowdy [phone] '@1928'$  FH    Staphylococcus lugdunensis NOT DETECTED NOT DETECTED Final   Streptococcus species NOT DETECTED NOT DETECTED Final   Streptococcus agalactiae NOT DETECTED NOT DETECTED Final   Streptococcus pneumoniae NOT DETECTED NOT DETECTED Final   Streptococcus pyogenes NOT DETECTED NOT DETECTED Final   A.calcoaceticus-baumannii NOT DETECTED NOT DETECTED Final   Bacteroides fragilis NOT DETECTED NOT DETECTED Final   Enterobacterales NOT DETECTED NOT DETECTED Final   Enterobacter cloacae complex NOT DETECTED NOT DETECTED Final   Escherichia coli NOT DETECTED NOT DETECTED Final   Klebsiella aerogenes NOT DETECTED NOT DETECTED Final   Klebsiella oxytoca NOT DETECTED NOT DETECTED Final   Klebsiella pneumoniae NOT DETECTED NOT DETECTED Final   Proteus species NOT DETECTED NOT DETECTED Final   Salmonella species NOT DETECTED NOT DETECTED Final   Serratia marcescens NOT DETECTED NOT DETECTED Final   Haemophilus influenzae NOT DETECTED NOT DETECTED Final   Neisseria meningitidis NOT DETECTED NOT DETECTED Final   Pseudomonas aeruginosa NOT DETECTED NOT DETECTED Final   Stenotrophomonas maltophilia NOT DETECTED NOT DETECTED Final   Candida albicans NOT DETECTED NOT DETECTED Final   Candida auris NOT DETECTED NOT DETECTED Final   Candida glabrata NOT DETECTED NOT DETECTED Final   Candida krusei NOT DETECTED NOT DETECTED Final   Candida parapsilosis NOT DETECTED NOT DETECTED Final   Candida tropicalis NOT DETECTED NOT DETECTED Final   Cryptococcus  neoformans/gattii NOT DETECTED NOT DETECTED Final   Methicillin resistance mecA/C DETECTED (A) NOT DETECTED Final    Comment: CRITICAL RESULT CALLED TO, READ BACK BY AND VERIFIED WITH: RN Ralph Dowdy [phone] '@1928'$  FH Performed at [facility], [address] [place], [place] [address]   Culture, blood (Routine X 2) w Reflex to ID Panel     Status: None (Preliminary result)   Collection Time:  08/04/21  6:15 AM   Specimen: BLOOD LEFT HAND  Result Value Ref Range Status   Specimen Description BLOOD LEFT HAND  Final   Special Requests   Final    BOTTLES DRAWN AEROBIC AND ANAEROBIC Blood Culture adequate volume   Culture   Final    NO GROWTH 4 DAYS Performed at [facility], [address], [place], [place] [address]    Report Status PENDING  Incomplete     Scheduled Meds:  aspirin  300 mg Rectal Daily   bisacodyl  10 mg Rectal Daily   budesonide (PULMICORT) nebulizer solution  0.5 mg Nebulization BID   Carbidopa-Levodopa ER  1 tablet Oral Q supper   Carbidopa-Levodopa ER  2 tablet Oral TID   Chlorhexidine Gluconate Cloth  6 each Topical Q0600   heparin injection (subcutaneous)  5,000 Units Subcutaneous Q8H   metoprolol tartrate  12.5 mg Oral BID   polyethylene glycol  17 g Oral Daily   sodium chloride flush  10-40 mL Intracatheter Q12H   sodium chloride flush  5-10 mL Intracatheter Q8H   Continuous Infusions:  sodium chloride Stopped (08/08/21 1602)   cefTRIAXone (ROCEPHIN)  IV 2 g (08/07/21 1940)   norepinephrine (LEVOPHED) Adult infusion 3 mcg/min (08/08/21 1609)   vancomycin 175 mL/hr at 08/08/21 1609   Followed by   Derrill Memo ON 08/09/2021] vancomycin      Procedures/Studies: DG CHEST PORT 1 VIEW  Result Date: 08/08/2021 CLINICAL DATA:  Shortness of breath. EXAM: PORTABLE CHEST 1 VIEW COMPARISON:  July 30, 2021. FINDINGS: The heart size and mediastinal contours are within normal limits. Left lung is clear. Minimal right basilar subsegmental atelectasis is noted.  Right-sided PICC line is noted. The visualized skeletal structures are unremarkable. IMPRESSION: Minimal right basilar subsegmental atelectasis. Electronically Signed   By: Marijo Conception M.D.   On: 08/08/2021 15:20   US Venous Img Upper Bilat (DVT)  Result Date: 08/08/2021 CLINICAL DATA:  81 year old male with bilateral upper extremity edema EXAM: BILATERAL UPPER EXTREMITY VENOUS DOPPLER ULTRASOUND TECHNIQUE: Gray-scale sonography with graded compression, as well as color Doppler and duplex ultrasound were performed to evaluate the bilateral upper extremity deep venous systems from the level of the subclavian vein and including the jugular, axillary, basilic, radial, ulnar and upper cephalic vein. Spectral Doppler was utilized to evaluate flow at rest and with distal augmentation maneuvers. COMPARISON:  None Available. FINDINGS: RIGHT UPPER EXTREMITY Internal Jugular Vein: Unable to assess the right IJ. Subclavian Vein: Unable to assess Axillary Vein: No evidence of thrombus. Normal compressibility, respiratory phasicity and response to augmentation. Cephalic Vein: No evidence of thrombus. Normal compressibility, respiratory phasicity and response to augmentation. Basilic Vein: No evidence of thrombus. Normal compressibility, respiratory phasicity and response to augmentation. Brachial Veins: No evidence of thrombus. Normal compressibility, respiratory phasicity and response to augmentation. Radial Veins: No evidence of thrombus. Normal compressibility, respiratory phasicity and response to augmentation. Ulnar Veins: No evidence of thrombus. Normal compressibility, respiratory phasicity and response to augmentation. Other Findings:  Edema of the right forearm LEFT UPPER EXTREMITY Internal Jugular Vein: No evidence of thrombus. Normal compressibility, respiratory phasicity and response to augmentation. Subclavian Vein: No evidence of thrombus. Normal compressibility, respiratory phasicity and response to  augmentation. Axillary Vein: No evidence of thrombus. Normal compressibility, respiratory phasicity and response to augmentation. Cephalic Vein: No evidence of thrombus. Normal compressibility, respiratory phasicity and response to augmentation. Basilic Vein: No evidence of thrombus. Normal compressibility, respiratory phasicity and response to augmentation. Brachial Veins: No evidence of  thrombus. Normal compressibility, respiratory phasicity and response to augmentation. Radial Veins: No evidence of thrombus. Normal compressibility, respiratory phasicity and response to augmentation. Ulnar Veins: No evidence of thrombus. Normal compressibility, respiratory phasicity and response to augmentation. Other Findings:  Edema IMPRESSION: Directed duplex of the bilateral upper extremity negative for DVT. Edema bilaterally Signed, Dulcy Fanny. Nadene Rubins, RPVI [facility] Electronically Signed   By: Corrie Mckusick D.O.   On: 08/08/2021 14:00   ECHOCARDIOGRAM COMPLETE  Result Date: 08/04/2021    ECHOCARDIOGRAM REPORT   Patient Name:   Elijah [name] Date of Exam: 08/04/2021 Medical Rec #:  [id number]       Height:       70.0 in Accession #:    [id number]      Weight:       199.7 lb Date of Birth:  Aug 02, 1940      BSA:          2.086 m Patient Age:    13 years        BP:           139/46 mmHg Patient Gender: M               HR:           81 bpm. Exam Location:  [facility] Procedure: 2D Echo, Cardiac Doppler and Color Doppler Indications:    Bacteremia  History:        Patient has no prior history of Echocardiogram examinations.                 Signs/Symptoms:Bacteremia; Risk Factors:Hypertension, Diabetes                 and Dyslipidemia. Technically difficult study. Patient with                 Parkinson's disease is unable to follow instructions.  Sonographer:    Wenda Low Referring Phys: [phone] [place] Valdes  Sonographer Comments: Technically difficult study  due to poor echo windows. Image acquisition challenging due to respiratory motion. IMPRESSIONS  1. Left ventricular ejection fraction, by estimation, is 70 to 75%. The left ventricle has hyperdynamic function. The left ventricle has no regional wall motion abnormalities. Left ventricular diastolic parameters are consistent with Grade I diastolic dysfunction (impaired relaxation).  2. Right ventricular systolic function is normal. The right ventricular size is normal. There is normal pulmonary artery systolic pressure. The estimated right ventricular systolic pressure is 53.2 mmHg.  3. The mitral valve is grossly normal. Trivial mitral valve regurgitation.  4. The aortic valve is calcified. Aortic valve regurgitation is not visualized. Aortic valve sclerosis/calcification is present, without any evidence of aortic stenosis. Aortic valve mean gradient measures 6.0 mmHg.  5. The inferior vena cava is normal in size with greater than 50% respiratory variability, suggesting right atrial pressure of 3 mmHg. Comparison(s): No prior Echocardiogram. Conclusion(s)/Recommendation(s): No evidence of valvular vegetations on this transthoracic echocardiogram. Consider a transesophageal echocardiogram to exclude infective endocarditis if clinically indicated. FINDINGS  Left Ventricle: Left ventricular ejection fraction, by estimation, is 70 to 75%. The left ventricle has hyperdynamic function. The left ventricle has no regional wall motion abnormalities. The left ventricular internal cavity size was normal in size. There is no left ventricular hypertrophy. Left ventricular diastolic parameters are consistent with Grade I diastolic dysfunction (impaired relaxation). Indeterminate filling pressures. Right Ventricle: The right ventricular size is normal. No increase in right ventricular wall thickness. Right ventricular systolic  function is normal. There is normal pulmonary artery systolic pressure. The tricuspid regurgitant velocity  is 2.66 m/s, and  with an assumed right atrial pressure of 3 mmHg, the estimated right ventricular systolic pressure is 68.3 mmHg. Left Atrium: Left atrial size was normal in size. Right Atrium: Right atrial size was normal in size. Pericardium: There is no evidence of pericardial effusion. Mitral Valve: The mitral valve is grossly normal. Trivial mitral valve regurgitation. MV peak gradient, 3.1 mmHg. The mean mitral valve gradient is 1.0 mmHg. Tricuspid Valve: The tricuspid valve is grossly normal. Tricuspid valve regurgitation is mild. Aortic Valve: The aortic valve is calcified. Aortic valve regurgitation is not visualized. Aortic valve sclerosis/calcification is present, without any evidence of aortic stenosis. Aortic valve mean gradient measures 6.0 mmHg. Aortic valve peak gradient measures 10.8 mmHg. Aortic valve area, by VTI measures 2.18 cm. Pulmonic Valve: The pulmonic valve was normal in structure. Pulmonic valve regurgitation is not visualized. Aorta: The aortic root and ascending aorta are structurally normal, with no evidence of dilitation. Venous: The inferior vena cava is normal in size with greater than 50% respiratory variability, suggesting right atrial pressure of 3 mmHg. IAS/Shunts: No atrial level shunt detected by color flow Doppler.  LEFT VENTRICLE PLAX 2D LVIDd:         4.90 cm   Diastology LVIDs:         2.70 cm   LV e' medial:    6.42 cm/s LV PW:         1.10 cm   LV E/e' medial:  10.7 LV IVS:        1.00 cm   LV e' lateral:   12.20 cm/s LVOT diam:     1.90 cm   LV E/e' lateral: 5.7 LV SV:         71 LV SV Index:   34 LVOT Area:     2.84 cm  LEFT ATRIUM           Index LA diam:      3.50 cm 1.68 cm/m LA Vol (A4C): 22.8 ml 10.93 ml/m  AORTIC VALVE AV Area (Vmax):    1.83 cm AV Area (Vmean):   1.73 cm AV Area (VTI):     2.18 cm AV Vmax:           164.00 cm/s AV Vmean:          119.000 cm/s AV VTI:            0.325 m AV Peak Grad:      10.8 mmHg AV Mean Grad:      6.0 mmHg LVOT Vmax:          106.00 cm/s LVOT Vmean:        72.700 cm/s LVOT VTI:          0.250 m LVOT/AV VTI ratio: 0.77  AORTA Ao Root diam: 3.40 cm MITRAL VALVE               TRICUSPID VALVE MV Area (PHT): 2.85 cm    TR Peak grad:   28.3 mmHg MV Area VTI:   3.35 cm    TR Vmax:        266.00 cm/s MV Peak grad:  3.1 mmHg MV Mean grad:  1.0 mmHg    SHUNTS MV Vmax:       0.88 m/s    Systemic VTI:  0.25 m MV Vmean:      48.2 cm/s   Systemic Diam: 1.90 cm  MV Decel Time: 266 msec MV E velocity: 69.00 cm/s MV A velocity: 56.10 cm/s MV E/A ratio:  1.23 Lyman Bishop MD Electronically signed by Lyman Bishop MD Signature Date/Time: 08/04/2021/4:53:01 PM    Final    [place] EKG SITE RITE  Result Date: 08/03/2021 If Site Rite image not attached, placement could not be confirmed due to current cardiac rhythm.  IR Perc Cholecystostomy  Result Date: 08/02/2021 INDICATION: 81 year old male referred for percutaneous cholecystostomy EXAM: CHOLECYSTOSTOMY MEDICATIONS: 500 mg levofloxacin; The antibiotic was administered within an appropriate time frame prior to the initiation of the procedure. ANESTHESIA/SEDATION: Moderate (conscious) sedation was employed during this procedure. A total of Versed 0.5 mg and Fentanyl 25 mcg was administered intravenously. Moderate Sedation Time: 11 minutes. The patient's level of consciousness and vital signs were monitored continuously by radiology nursing throughout the procedure under my direct supervision. FLUOROSCOPY TIME:  Fluoroscopy Time: 0 minutes 24 seconds (3 mGy). COMPLICATIONS: None PROCEDURE: Informed written consent was obtained from the patient and the patient's family after a thorough discussion of the procedural risks, benefits and alternatives. All questions were addressed. Maximal Sterile Barrier Technique was utilized including caps, mask, sterile gowns, sterile gloves, sterile drape, hand hygiene and skin antiseptic. A timeout was performed prior to the initiation of the procedure. Ultrasound  survey of the right upper quadrant was performed for planning purposes. Once the patient is prepped and draped in the usual sterile fashion, the skin and subcutaneous tissues overlying the gallbladder were generously infiltrated 1% lidocaine for local anesthesia. A coaxial needle was advanced under ultrasound guidance through the skin subcutaneous tissues and a small segment of liver into the gallbladder lumen. With removal of the stylet, spontaneous dark bile drainage occurred. Using modified Seldinger technique, a 10 French drain was placed into the gallbladder fossa, with aspiration of the sample for the lab. Contrast injection confirmed position of the tube within the gallbladder lumen. Drainage catheter was attached to gravity drain with a suture retention placed. Patient tolerated the procedure well and remained hemodynamically stable throughout. No complications were encountered and no significant blood loss encountered. IMPRESSION: Status post percutaneous cholecystostomy Signed, Dulcy Fanny. Nadene Rubins, RPVI [facility] Radiology Electronically Signed   By: Corrie Mckusick D.O.   On: 08/02/2021 14:33   NM Hepatobiliary Liver Func  Result Date: 08/01/2021 CLINICAL DATA:  Concern for cholecystitis. EXAM: NUCLEAR MEDICINE HEPATOBILIARY IMAGING TECHNIQUE: Sequential images of the abdomen were obtained out to 60 minutes following intravenous administration of radiopharmaceutical. 3 mm IV morphine administered to augment filling of the gallbladder. RADIOPHARMACEUTICALS:  5.5 mCi Tc-43m Choletec IV COMPARISON:  MRI 07/29/2021 FINDINGS: Uniform accumulation radiotracer within the liver. Counts are evident within the small bowel by 20 minutes. The gallbladder fails to fill at 60 minutes. IV morphine was administered to augment filling of the gallbladder. Post morphine injection, the gallbladder fails to fill. IMPRESSION: 1. Non filling of the gallbladder is  consistent with obstruction of cystic duct / acute cholecystitis. 2. Patent common bile duct. These results will be called to the ordering clinician or representative by the Radiologist Assistant, and communication documented in the PACS or [employer] Electronically Signed   By: SSuzy BouchardM.D.   On: 08/01/2021 11:52   CT HEAD WO CONTRAST (5MM)  Result Date: 07/31/2021 CLINICAL DATA:  Altered mental status EXAM: CT HEAD WITHOUT CONTRAST TECHNIQUE: Contiguous axial images were obtained from the base of the skull through the vertex without intravenous contrast. RADIATION DOSE  REDUCTION: This exam was performed according to the departmental dose-optimization program which includes automated exposure control, adjustment of the mA and/or kV according to patient size and/or use of iterative reconstruction technique. COMPARISON:  03/19/2021 FINDINGS: Brain: No acute intracranial findings are seen. There are no signs of bleeding within the cranium. Cortical sulci are prominent more so in the left cerebral hemisphere. There is no focal effacement of cortical sulci. Vascular: Unremarkable. Skull: Unremarkable. Sinuses/Orbits: There is mucosal thickening in the ethmoid sinus. Other: None. IMPRESSION: No acute intracranial findings are seen in noncontrast CT brain. Atrophy. Electronically Signed   By: Elmer Picker M.D.   On: 07/31/2021 14:40   DG Chest Port 1 View  Addendum Date: 07/30/2021   ADDENDUM REPORT: 07/30/2021 14:27 ADDENDUM: Voice recognition error in the impression. For the first sentence, the NG tube should be described as malpositioned, not well positioned. NG tube curls in the neck and does not extend into the thorax and will need to be replaced/repositioned. Electronically Signed   By: Lajean Manes M.D.   On: 07/30/2021 14:27   Result Date: 07/30/2021 CLINICAL DATA:  NG tube placement. EXAM: PORTABLE CHEST 1 VIEW COMPARISON:  07/29/2021. FINDINGS: NG tube is partly visualized,  curled in the neck, presumably in the oropharynx. Cardiac silhouette normal in size. No mediastinal or hilar masses. Lungs demonstrate prominent bronchovascular markings, otherwise clear. No pleural effusion or pneumothorax. IMPRESSION: 1. NG tube well positioned, curled in the neck, presumably in the oropharynx. 2. No acute cardiopulmonary disease. Electronically Signed: By: Lajean Manes M.D. On: 07/30/2021 13:46   DG Abd 1 View  Result Date: 07/30/2021 CLINICAL DATA:  Gastric distension EXAM: ABDOMEN - 1 VIEW COMPARISON:  None Available. FINDINGS: The bowel gas pattern is nonobstructive. Mild gaseous distension of stomach and bowel. There are no abnormal calcifications overlying the kidneys. Pelvic phleboliths. Chronic right anterolateral tenth eleventh rib injuries. Degenerative changes of the spine with levoconvex lumbar curvature. IMPRESSION: No evidence of bowel obstruction. Electronically Signed   By: Maurine Simmering M.D.   On: 07/30/2021 11:33   MR ABDOMEN MRCP WO CONTRAST  Result Date: 07/29/2021 CLINICAL DATA:  Upper abdominal pain. Dilated common bile duct on ultrasound. EXAM: MRI ABDOMEN WITHOUT CONTRAST  (INCLUDING MRCP) TECHNIQUE: Multiplanar multisequence MR imaging of the abdomen was performed. Heavily T2-weighted images of the biliary and pancreatic ducts were obtained, and three-dimensional MRCP images were rendered by post processing. COMPARISON:  Ultrasound 07/29/2021 FINDINGS: Motion degradation exam. ICU patient. ICU patients have difficulty following breath holding commands. Lower chest:  Small bilateral pleural effusions Hepatobiliary: No intrahepatic biliary duct dilatation. Common bile duct normal caliber. The no gallstones identified. Small amount sludge within the gallbladder. No gallbladder distension or inflammation. Pancreas: Normal pancreatic parenchymal intensity. No ductal dilatation or inflammation. Spleen: Normal spleen. Adrenals/urinary tract: Adrenal glands and kidneys are  normal. Stomach/Bowel: Stomach and limited of the small bowel is unremarkable Vascular/Lymphatic: Abdominal aortic normal caliber. No retroperitoneal periportal lymphadenopathy. Musculoskeletal: No aggressive osseous lesion IMPRESSION: 1. Normal common bile duct. No intrahepatic biliary duct dilatation. 2. Small amount gallbladder sludge.  No evidence of cholecystitis. 3. Normal pancreas. 4. Bibasilar atelectasis. 5. Imaging degraded by respiratory motion. Electronically Signed   By: Suzy Bouchard M.D.   On: 07/29/2021 19:59   MR 3D Recon At Scanner  Result Date: 07/29/2021 CLINICAL DATA:  Upper abdominal pain. Dilated common bile duct on ultrasound. EXAM: MRI ABDOMEN WITHOUT CONTRAST  (INCLUDING MRCP) TECHNIQUE: Multiplanar multisequence MR imaging of the abdomen was performed. Heavily  T2-weighted images of the biliary and pancreatic ducts were obtained, and three-dimensional MRCP images were rendered by post processing. COMPARISON:  Ultrasound 07/29/2021 FINDINGS: Motion degradation exam. ICU patient. ICU patients have difficulty following breath holding commands. Lower chest:  Small bilateral pleural effusions Hepatobiliary: No intrahepatic biliary duct dilatation. Common bile duct normal caliber. The no gallstones identified. Small amount sludge within the gallbladder. No gallbladder distension or inflammation. Pancreas: Normal pancreatic parenchymal intensity. No ductal dilatation or inflammation. Spleen: Normal spleen. Adrenals/urinary tract: Adrenal glands and kidneys are normal. Stomach/Bowel: Stomach and limited of the small bowel is unremarkable Vascular/Lymphatic: Abdominal aortic normal caliber. No retroperitoneal periportal lymphadenopathy. Musculoskeletal: No aggressive osseous lesion IMPRESSION: 1. Normal common bile duct. No intrahepatic biliary duct dilatation. 2. Small amount gallbladder sludge.  No evidence of cholecystitis. 3. Normal pancreas. 4. Bibasilar atelectasis. 5. Imaging degraded  by respiratory motion. Electronically Signed   By: Suzy Bouchard M.D.   On: 07/29/2021 19:59   DG CHEST PORT 1 VIEW  Result Date: 07/29/2021 CLINICAL DATA:  Shortness of breath.  Chest pain.  Diaphoresis. EXAM: PORTABLE CHEST 1 VIEW COMPARISON:  07/29/2021 FINDINGS: Low lung volumes are present, causing crowding of the pulmonary vasculature. There is likely mild subsegmental atelectasis along both hemidiaphragms. Mild cardiomegaly. Prominence of the right upper mediastinal margin, cannot exclude adenopathy or nodule. IMPRESSION: 1. Low lung volumes and suspected mild atelectasis at the lung bases. 2. Somewhat nodular prominence the right upper mediastinum, cannot exclude adenopathy or nodule although this might possibly be from tortuous vasculature. Chest CT could be utilized for further characterization. 3. Mild cardiomegaly. Electronically Signed   By: Van Clines M.D.   On: 07/29/2021 10:05   US Abdomen Limited RUQ (LIVER/GB)  Result Date: 07/29/2021 CLINICAL DATA:  Abnormal LFTs EXAM: ULTRASOUND ABDOMEN LIMITED RIGHT UPPER QUADRANT COMPARISON:  CT abdomen and pelvis 07/28/2021 FINDINGS: Gallbladder: Moderately distended and contains echogenic calculi and sludge dependently. 5 mm echogenic likely polyp at the fundus. No significant wall thickening or pericholecystic fluid identified. Common bile duct: Diameter: 8 mm, upper normal for the patient's age. Liver: No focal lesion identified. Within normal limits in parenchymal echogenicity. Portal vein is patent on color Doppler imaging with normal direction of blood flow towards the liver. Other: None. IMPRESSION: 1. Cholelithiasis and sludge. 2. 5 mm likely polyp in the gallbladder. 3. Upper normal caliber of the common bile duct and moderate distention of the gallbladder. Correlate clinically and consider MRCP if indicated. Electronically Signed   By: Ofilia Neas M.D.   On: 07/29/2021 09:56   DG CHEST PORT 1 VIEW  Result Date:  07/29/2021 CLINICAL DATA:  Wheezing EXAM: PORTABLE CHEST 1 VIEW COMPARISON:  07/28/2021 FINDINGS: Limited low volume chest with generalized interstitial coarsening/crowding. No Kerley lines or pleural effusion. Normal heart size and mediastinal contours. Extensive artifact from EKG leads IMPRESSION: Limited low volume chest that is stable from yesterday Electronically Signed   By: Jorje Guild M.D.   On: 07/29/2021 06:48   DG Wrist Complete Left  Result Date: 07/29/2021 CLINICAL DATA:  Wrist pain EXAM: LEFT WRIST - COMPLETE 3+ VIEW COMPARISON:  None Available. FINDINGS: No fracture or malalignment. Vascular calcifications. Advanced joint space narrowing of the radiocarpal joint. No erosions. IMPRESSION: 1. 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 2. No acute osseous abnormality Electronically Signed   By: Donavan Foil M.D.   On: 07/29/2021 00:09   CT ABDOMEN PELVIS WO CONTRAST  Result Date: 07/28/2021 CLINICAL DATA:  Abdominal pain EXAM: CT ABDOMEN AND PELVIS WITHOUT  CONTRAST TECHNIQUE: Multidetector CT imaging of the abdomen and pelvis was performed following the standard protocol without IV contrast. RADIATION DOSE REDUCTION: This exam was performed according to the departmental dose-optimization program which includes automated exposure control, adjustment of the mA and/or kV according to patient size and/or use of iterative reconstruction technique. COMPARISON:  07/26/2021 FINDINGS: Lower chest: Small pleural effusions are noted bilaterally slightly increased when compared with the prior exam. Mild bibasilar atelectasis is seen. Hepatobiliary: Liver is within normal limits. Dependent gallstones are noted within the gallbladder which is well distended. Pneumobilia is again identified consistent with the given clinical history of sphincterotomy. Pancreas: Unremarkable. No pancreatic ductal dilatation or surrounding inflammatory changes. Spleen: Normal in size without focal abnormality. Adrenals/Urinary Tract:  Adrenal glands are within normal limits bilaterally. Kidneys show no tiny nonobstructing renal stone on the right. No definitive stones are noted on the left. Hypodense lesion is noted within the left kidney measuring approximately 1.5 cm consistent with small cysts stable in appearance from the prior exam. No obstructive changes are seen. The bladder is well distended with mildly opacified urine. A portion of the bladder extends into a large right sided inguinal hernia. This is stable in appearance from the prior exam. Stomach/Bowel: Scattered fecal material is noted throughout the colon. Mild diverticular change is seen. The appendix has been surgically removed consistent with the given clinical history. Small bowel and stomach are unremarkable. Vascular/Lymphatic: Aortic atherosclerosis. No enlarged abdominal or pelvic lymph nodes. Reproductive: Prostate is unremarkable. Other: No ascites is noted. Bilateral fat containing inguinal hernias are seen. Persistent herniated bladder into the right hernia is noted. Musculoskeletal: Degenerative changes of lumbar spine are seen. IMPRESSION: Small nonobstructing right renal stone. Bilateral inguinal hernias with herniated bladder on the right stable from the prior study. Multiple small gallstones without complicating factors. No other focal abnormality is noted. Electronically Signed   By: Inez Catalina M.D.   On: 07/28/2021 23:58   DG Chest Port 1 View  Result Date: 07/28/2021 CLINICAL DATA:  Respiratory distress EXAM: PORTABLE CHEST 1 VIEW COMPARISON:  07/26/2021 FINDINGS: Hypoventilatory changes. No consolidation or effusion. Stable cardiomediastinal silhouette. No pneumothorax IMPRESSION: No active disease.  Low lung volumes. Electronically Signed   By: Donavan Foil M.D.   On: 07/28/2021 22:42   DG Chest Port 1 View  Result Date: 07/26/2021 CLINICAL DATA:  leukocytosis, back pain, poss PNA? EXAM: PORTABLE CHEST 1 VIEW COMPARISON:  Radiograph 08/01/2018  FINDINGS: Unchanged cardiomediastinal silhouette. Low lung volumes. No focal airspace disease. No pleural effusion. No pneumothorax. 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 IMPRESSION: Low lung volumes.  No focal airspace disease. High-riding humeral heads bilaterally can be seen in distal rotator cuff tendinopathy. Electronically Signed   By: Maurine Simmering M.D.   On: 07/26/2021 14:09   CT Abdomen Pelvis W Contrast  Result Date: 07/26/2021 CLINICAL DATA:  Abdominal pain, acute, nonlocalized dementia, prior abd surgery, back? pain, diaphoresis, concern for aorta vs. nephrolith EXAM: CT ABDOMEN AND PELVIS WITH CONTRAST TECHNIQUE: Multidetector CT imaging of the abdomen and pelvis was performed using the standard protocol following bolus administration of intravenous contrast. RADIATION DOSE REDUCTION: This exam was performed according to the departmental dose-optimization program which includes automated exposure control, adjustment of the mA and/or kV according to patient size and/or use of iterative reconstruction technique. CONTRAST:  5m OMNIPAQUE IOHEXOL 300 MG/ML  SOLN COMPARISON:  CT 12/23/2015 FINDINGS: Lower chest: No acute abnormality. Hepatobiliary: No focal liver abnormality is seen. Mild gallbladder distension with layering  sludge or tiny stones. There is pneumobilia consistent with history of prior ERCP and sphincterotomy. Pancreas: Unremarkable. No pancreatic ductal dilatation or surrounding inflammatory changes. Spleen: Normal in size without focal abnormality. Adrenals/Urinary Tract: Adrenal glands are unremarkable. No hydronephrosis or nephrolithiasis. There are nonobstructive bilateral renal stones. Unchanged bilateral renal cysts. There is a right inguinal hernia containing herniated bladder. Stomach/Bowel: The stomach is within normal limits. There is no evidence of bowel obstruction.Prior appendectomy. Scattered colonic diverticula. No diverticulitis. Moderate  rectal stool burden. Vascular/Lymphatic: Aortoiliac atherosclerosis. No AAA. No lymphadenopathy. Reproductive: Unremarkable. Other: Right inguinal hernia containing hernia bladder. Fat containing left inguinal hernia. Unchanged anterior abdominal wall scarring. Musculoskeletal: Levoconvex upper lumbar and dextroconvex lower lumbar curvatures. 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 There is severe multilevel facet arthropathy. Mild bilateral hip osteoarthritis. IMPRESSION: Nonobstructive 3 mm renal stones bilaterally. No hydronephrosis or ureterolithiasis. Right inguinal hernia containing distended herniated bladder. Mild gallbladder is tension with layering sludge or small stones. No other findings to suggest cholecystitis by CT. 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 Aortoiliac atherosclerosis.  No AAA. Moderate rectal stool burden. Electronically Signed   By: Maurine Simmering M.D.   On: 07/26/2021 10:10    Orson Eva, DO  [facility]  If 7PM-7AM, please contact night-coverage [email or web address] Password [email or web address] 08/08/2021, 4:47 PM   LOS: 10 days

## 2021-08-08 NOTE — NC FL2 (Signed)
[place] LEVEL OF CARE SCREENING TOOL     IDENTIFICATION  Patient Name: Elijah [name] Birthdate: Dec 06, 1940 Sex: male Admission Date (Current Location): 07/28/2021  [facility] and [place] Number:  [employer] and Address:  [place] [address] [place]      Provider Number: [id number]  Attending Physician Name and Address:  Orson Eva, MD  Relative Name and Phone Number:       Current Level of Care: Hospital Recommended Level of Care: [place] Prior Approval Number:    Date Approved/Denied:   PASRR Number: [id number]  Discharge Plan: SNF    Current Diagnoses: Patient Active Problem List   Diagnosis Date Noted   Arm edema 08/07/2021   Hypernatremia 08/02/2021   Acute cholecystitis    Thrombocytopenia ([place]) 07/31/2021   AKI (acute kidney injury) ([place]) 07/29/2021   Severe sepsis ([place]) 07/29/2021   Acute respiratory failure with hypoxia ([place]) 07/29/2021   Lactic acidosis 07/29/2021   HLD (hyperlipidemia) 78/24/2353   Acute metabolic encephalopathy 61/44/3154   Bacteremia due to group B Streptococcus    S/P ERCP    Biliary obstruction    Pancreatitis, acute    Choledocholithiasis    Total bilirubin, elevated    Elevated LFTs    Pancreatitis 09/02/2015   Parkinson disease ([place])    Diabetes mellitus without complication ([employer])    Hypertension    Essential hypertension    Jaundice    Chronic low back pain 03/24/2015   OSA (obstructive sleep apnea) 04/10/2014   Paralysis agitans ([place]) 10/07/2012    Orientation RESPIRATION BLADDER Height & Weight     Self  Normal External catheter Weight: 207 lb 10.8 oz (94.2 kg) Height:  '5\' 10"'$  (177.8 cm)  BEHAVIORAL SYMPTOMS/MOOD NEUROLOGICAL BOWEL NUTRITION STATUS      Incontinent    AMBULATORY STATUS COMMUNICATION OF NEEDS Skin   Extensive Assist Verbally Skin abrasions, Bruising, Other (Comment) (Redness/rash to groin. Weeping left arm.)                        Personal Care Assistance Level of Assistance  Bathing, Feeding, Dressing Bathing Assistance: Maximum assistance Feeding assistance: Limited assistance Dressing Assistance: Maximum assistance     Functional Limitations Info  Sight, Hearing, Speech Sight Info: Adequate Hearing Info: Adequate Speech Info: Adequate    SPECIAL CARE FACTORS FREQUENCY  PT (By licensed PT)     PT Frequency: 5x weekly              Contractures      Additional Factors Info  Code Status, Allergies, Psychotropic Code Status Info: Full code Allergies Info: Aspirin, Coconut (cocos nucifera), Penicillins Psychotropic Info: Lexapro, Ativan, Zyprexa, Seroquel         Current Medications (08/08/2021):  This is the current hospital active medication list Current Facility-Administered Medications  Medication Dose Route Frequency Provider Last Rate Last Admin   0.45 % sodium chloride infusion   Intravenous Continuous Tat, David, MD 50 mL/hr at 08/08/21 1129 New Bag at 08/08/21 1129   acetaminophen (TYLENOL) tablet 650 mg  650 mg Oral Q6H PRN Zierle-Ghosh, Asia B, DO   650 mg at 08/04/21 2117   Or   acetaminophen (TYLENOL) suppository 650 mg  650 mg Rectal Q6H PRN Zierle-Ghosh, Asia B, DO   650 mg at 07/29/21 2332   aspirin suppository 300 mg  300 mg Rectal Daily Tat, David, MD   300 mg at 08/08/21  2297   bisacodyl (DULCOLAX) suppository 10 mg  10 mg Rectal Daily Tat, David, MD   10 mg at 08/08/21 0842   budesonide (PULMICORT) nebulizer solution 0.5 mg  0.5 mg Nebulization BID Barton Dubois, MD   0.5 mg at 08/08/21 0739   Carbidopa-Levodopa ER (SINEMET CR) 25-100 MG tablet controlled release 1 tablet  1 tablet Oral Q supper Zierle-Ghosh, Asia B, DO   1 tablet at 08/07/21 1725   Carbidopa-Levodopa ER (SINEMET CR) 25-100 MG tablet controlled release 2 tablet  2 tablet Oral TID Zierle-Ghosh, Asia B, DO   2 tablet at 08/08/21 1125   cefTRIAXone (ROCEPHIN) 2 g in sodium chloride 0.9 % 100 mL IVPB   2 g Intravenous Q24H Barton Dubois, MD 200 mL/hr at 08/07/21 1940 2 g at 08/07/21 1940   Chlorhexidine Gluconate Cloth 2 % PADS 6 each  6 each Topical Q0600 Barton Dubois, MD   6 each at 08/08/21 0555   heparin injection 5,000 Units  5,000 Units Subcutaneous Franco Collet, MD   5,000 Units at 08/08/21 1354   HYDROcodone-acetaminophen (NORCO/VICODIN) 5-325 MG per tablet 1 tablet  1 tablet Oral Q6H PRN Tat, Shanon Brow, MD   1 tablet at 08/06/21 2346   ipratropium-albuterol (DUONEB) 0.5-2.5 (3) MG/3ML nebulizer solution 3 mL  3 mL Nebulization Q6H PRN Barton Dubois, MD   3 mL at 07/31/21 2033   labetalol (NORMODYNE) injection 5 mg  5 mg Intravenous Q6H PRN Adefeso, Oladapo, DO       metoprolol tartrate (LOPRESSOR) tablet 12.5 mg  12.5 mg Oral BID Tat, David, MD   12.5 mg at 08/08/21 0841   ondansetron (ZOFRAN) tablet 4 mg  4 mg Oral Q6H PRN Zierle-Ghosh, Asia B, DO       Or   ondansetron (ZOFRAN) injection 4 mg  4 mg Intravenous Q6H PRN Zierle-Ghosh, Asia B, DO       polyethylene glycol (MIRALAX / GLYCOLAX) packet 17 g  17 g Oral Daily Tat, David, MD   17 g at 08/08/21 0841   sodium chloride flush (NS) 0.9 % injection 10-40 mL  10-40 mL Intracatheter Q12H Tat, Shanon Brow, MD   10 mL at 08/08/21 0842   sodium chloride flush (NS) 0.9 % injection 10-40 mL  10-40 mL Intracatheter PRN Tat, David, MD       sodium chloride flush (NS) 0.9 % injection 5-10 mL  5-10 mL Intracatheter Q8H Boisseau, Hayley, PA   10 mL at 08/08/21 1354     Discharge Medications: Please see discharge summary for a list of discharge medications.  Relevant Imaging Results:  Relevant Lab Results:   Additional Information SSN: [id number].  Salome Arnt, LCSW

## 2021-08-08 NOTE — Progress Notes (Signed)
SLP Cancellation Note  Patient Details Name: Elijah [name] MRN: [id number] DOB: 06-08-1940   Cancelled treatment:       Reason Eval/Treat Not Completed: Other (comment) (Pt off floor at ultrasound)  Thank you,  Genene Churn, [place]  [place] 08/08/2021, 1:29 PM

## 2021-08-08 NOTE — Plan of Care (Signed)
  Problem: Acute Rehab PT Goals(only PT should resolve) Goal: Pt Will Go Supine/Side To Sit Outcome: Progressing Flowsheets (Taken 08/08/2021 1404) Pt will go Supine/Side to Sit:  with maximum assist  with moderate assist Goal: Patient Will Transfer Sit To/From Stand Outcome: Progressing Flowsheets (Taken 08/08/2021 1404) Patient will transfer sit to/from stand:  with maximum assist  with moderate assist Goal: Pt Will Transfer Bed To Chair/Chair To Bed Outcome: Progressing Flowsheets (Taken 08/08/2021 1404) Pt will Transfer Bed to Chair/Chair to Bed:  with max assist  with mod assist Goal: Pt Will Perform Standing Balance Or Pre-Gait Outcome: Progressing Flowsheets (Taken 08/08/2021 1404) Pt will perform standing balance or pre-gait:  1-2 min  with bilateral UE support  with moderate assist  with maximum assist Goal: Pt Will Ambulate Outcome: Progressing Flowsheets (Taken 08/08/2021 1404) Pt will Ambulate:  25 feet  with maximum assist  with moderate assist  with rolling walker   2:05 PM, 08/08/21 Lestine Box, S/PT

## 2021-08-08 NOTE — Plan of Care (Signed)

## 2021-08-08 NOTE — TOC Progression Note (Signed)
Transition of Care [facility]) - Progression Note    Patient Details  Name: Elijah [name] MRN: [id number] Date of Birth: [birth year]  Transition of Care [facility]) CM/SW Contact  Salome Arnt, [place] Phone Number: 08/08/2021, 2:02 PM  Clinical Narrative:  PT recommending SNF. Discussed with wife who is agreeable. Will initiate bed search and insurance authorization.      Expected Discharge Plan: [place] Barriers to Discharge: Continued Medical Work up  Expected Discharge Plan and Services Expected Discharge Plan: [place]                                               Social Determinants of Health (SDOH) Interventions    Readmission Risk Interventions     No data to display

## 2021-08-09 ENCOUNTER — Inpatient Hospital Stay (HOSPITAL_COMMUNITY): Payer: PPO

## 2021-08-09 DIAGNOSIS — A419 Sepsis, unspecified organism: Secondary | ICD-10-CM | POA: Diagnosis not present

## 2021-08-09 DIAGNOSIS — R7881 Bacteremia: Secondary | ICD-10-CM | POA: Diagnosis not present

## 2021-08-09 DIAGNOSIS — Z7189 Other specified counseling: Secondary | ICD-10-CM | POA: Diagnosis not present

## 2021-08-09 DIAGNOSIS — J9601 Acute respiratory failure with hypoxia: Secondary | ICD-10-CM | POA: Diagnosis not present

## 2021-08-09 DIAGNOSIS — N179 Acute kidney failure, unspecified: Secondary | ICD-10-CM | POA: Diagnosis not present

## 2021-08-09 DIAGNOSIS — Z515 Encounter for palliative care: Secondary | ICD-10-CM | POA: Diagnosis not present

## 2021-08-09 DIAGNOSIS — G9341 Metabolic encephalopathy: Secondary | ICD-10-CM | POA: Diagnosis not present

## 2021-08-09 LAB — GLUCOSE, CAPILLARY
Glucose-Capillary: 119 mg/dL — ABNORMAL HIGH (ref 70–99)
Glucose-Capillary: 121 mg/dL — ABNORMAL HIGH (ref 70–99)
Glucose-Capillary: 146 mg/dL — ABNORMAL HIGH (ref 70–99)

## 2021-08-09 LAB — URINE CULTURE: Culture: <10000 — AB

## 2021-08-09 LAB — COMPREHENSIVE METABOLIC PANEL
ALT: <5 U/L (ref 0–44)
AST: 28 U/L (ref 15–41)
Albumin: <1.5 g/dL — ABNORMAL LOW (ref 3.5–5.0)
Alkaline Phosphatase: 116 U/L (ref 38–126)
Anion gap: 2 — ABNORMAL LOW (ref 5–15)
BUN: 32 mg/dL — ABNORMAL HIGH (ref 8–23)
CO2: 20 mmol/L — ABNORMAL LOW (ref 22–32)
Calcium: 7.6 mg/dL — ABNORMAL LOW (ref 8.9–10.3)
Chloride: 114 mmol/L — ABNORMAL HIGH (ref 98–111)
Creatinine, Ser: 0.9 mg/dL (ref 0.61–1.24)
GFR, Estimated: >60 mL/min (ref >60)
Glucose, Bld: 127 mg/dL — ABNORMAL HIGH (ref 70–99)
Potassium: 4 mmol/L (ref 3.5–5.1)
Sodium: 136 mmol/L (ref 135–145)
Total Bilirubin: 1 mg/dL (ref 0.3–1.2)
Total Protein: 6 g/dL — ABNORMAL LOW (ref 6.5–8.1)

## 2021-08-09 LAB — CBC WITH DIFFERENTIAL/PLATELET
Abs Immature Granulocytes: 0.24 10*3/uL — ABNORMAL HIGH (ref 0.00–0.07)
Basophils Absolute: 0 10*3/uL (ref 0.0–0.1)
Basophils Relative: 0 %
Eosinophils Absolute: 0.1 10*3/uL (ref 0.0–0.5)
Eosinophils Relative: 1 %
HCT: 22.8 % — ABNORMAL LOW (ref 39.0–52.0)
Hemoglobin: 7.4 g/dL — ABNORMAL LOW (ref 13.0–17.0)
Immature Granulocytes: 2 %
Lymphocytes Relative: 13 %
Lymphs Abs: 1.9 10*3/uL (ref 0.7–4.0)
MCH: 31.9 pg (ref 26.0–34.0)
MCHC: 32.5 g/dL (ref 30.0–36.0)
MCV: 98.3 fL (ref 80.0–100.0)
Monocytes Absolute: 1 10*3/uL (ref 0.1–1.0)
Monocytes Relative: 6 %
Neutro Abs: 11.6 10*3/uL — ABNORMAL HIGH (ref 1.7–7.7)
Neutrophils Relative %: 78 %
Platelets: 517 10*3/uL — ABNORMAL HIGH (ref 150–400)
RBC: 2.32 MIL/uL — ABNORMAL LOW (ref 4.22–5.81)
RDW: 14.8 % (ref 11.5–15.5)
WBC: 14.8 10*3/uL — ABNORMAL HIGH (ref 4.0–10.5)
nRBC: 0 % (ref 0.0–0.2)

## 2021-08-09 LAB — CULTURE, BLOOD (ROUTINE X 2)
Culture: NO GROWTH
Special Requests: ADEQUATE

## 2021-08-09 LAB — PROCALCITONIN: Procalcitonin: 1.37 ng/mL

## 2021-08-09 MED ORDER — IOHEXOL 300 MG/ML  SOLN
100.0000 mL | Freq: Once | INTRAMUSCULAR | Status: AC | PRN
Start: 2021-08-09 — End: 2021-08-09
  Administered 2021-08-09: 100 mL via INTRAVENOUS

## 2021-08-09 NOTE — Progress Notes (Signed)
Palliative: Secure chat from bedside nursing staff who states that wife would like to meet with palliative medicine team.  Elijah [name] is lying quietly in bed.  He appears acutely/chronically ill and frail.  He does not respond in any meaningful way to voice or touch.  He clearly cannot make his basic needs known.  His wife, Elijah [name], and daughter Elijah [name] are present at bedside.  We talked about his acute and chronic health concerns, his DVT testing of BL UE which was negative, low BP and need for vasopressor support.  We talk about 11 days in the hospital with no "meaningful" improvements.  We talk about goals of care for recovery, disposition options.  We talk about LTAC and qualifications at daughters request.  We also talk about hospice care and the concept of "let nature take it's course", unburdening Elijah [name] from painful treatments that aren't changing things for him.   We talked about how to make choices for loved ones like 1) keeping them at then center of decision making, 2) are we doing something for him and to him.  Questions answered. PMT to follow.   Conference with bedside nursing staff related to patient condition, needs.  Plan:  Continue to treat, but no CPR or intubation.  Time for outcomes.      74 minutes  Quinn Axe, NP Palliative medicine team Team phone [phone] Greater than 50% of this time was spent counseling and coordinating care related to the above assessment and plan.

## 2021-08-09 NOTE — Plan of Care (Signed)

## 2021-08-09 NOTE — TOC Progression Note (Signed)
Transition of Care [facility]) - Progression Note    Patient Details  Name: Elijah [name] MRN: [id number] Date of Birth: [birth year]  Transition of Care [facility]) CM/SW Contact  Ihor Gully, LCSW Phone Number: 08/09/2021, 2:01 PM  Clinical Narrative:    [facility] accepts. EMS transport authorized [phone]) for 90 days. SNF authorization is being reviewed by medical director review at [facility].    Expected Discharge Plan: [place] Barriers to Discharge: Continued Medical Work up  Expected Discharge Plan and Services Expected Discharge Plan: [place]                                               Social Determinants of Health (SDOH) Interventions    Readmission Risk Interventions     No data to display

## 2021-08-09 NOTE — TOC Progression Note (Signed)
Transition of Care [facility]) - Progression Note    Patient Details  Name: Elijah [name] MRN: [id number] Date of Birth: 1940/05/10  Transition of Care [facility]) CM/SW Contact  Ihor Gully, LCSW Phone Number: 08/09/2021, 2:36 PM  Clinical Narrative:    SNF auth denied. Attending notified, declines peer to peer as patient is not medically ready. TOC will continue to follow and can reinstate authorization if as patient once medically stable if necessary.    Expected Discharge Plan: [place] Barriers to Discharge: Continued Medical Work up  Expected Discharge Plan and Services Expected Discharge Plan: [place]                                               Social Determinants of Health (SDOH) Interventions    Readmission Risk Interventions     No data to display

## 2021-08-09 NOTE — Progress Notes (Signed)
PROGRESS NOTE  Elijah [name] ZSW:[id number] DOB: March 09, 1940 DOA: 07/28/2021 PCP: Celene Squibb, MD  Brief History:  As per H&P written by Dr.Zierle-Ghosh On 07/29/2021 Elijah [name] is a 81 y.o. male with medical history significant of history of chronic low back pain, diabetes mellitus type 2, hyperlipidemia, hypertension, Parkinson disease, obstructive sleep apnea, history of biliary obstruction and pancreatitis with ERCP in 2018, presents ED with chief complaint of dyspnea.  At time of presentation wife was at bedside.  She reported to the ED provider that patient has had 24 hours of shortness of breath and increased work of breathing.  He was recently seen in the ED 2 days ago for back pain, which was acute on chronic.  They had denied fevers at home, but wife did report the patient had left arm pain when she was trying to lift him up.  ED provider note reports that patient denied current abdominal pain.  Patient is not answering questions for me, but does have voluntary guarding with palpation of his abdomen.  Patient arrived in the ER satting in the low 90s on 4 L nasal cannula.  He was put on nonrebreather for work of breathing.  Sepsis protocol was started.  Patient was treated with vancomycin, cefepime, Flagyl, and given a 3 L bolus.  Lactic acid was significantly elevated 6.3, repeat 4.6.  Chest x-ray was negative for acute disease, CT abdomen pelvis did not given definitive explanation of his symptoms either.  Urine was borderline.  Patient did have an AKI.  Admission was requested for further work-up of altered mental status, dyspnea, and sepsis.   Patient was found to have GBS bacteremia with source as acute cholecystitis.  He was not a surgical candidate.  IR placed cholecystotomy tube on 6/13.  His IV antibiotics were continued.  Echo (TTE) was neg for vegetations.  His IVF was adjusted for his hypernatremia and his hydromorphone dosing was decreased with some improvement of his  encephalopathy. Pt gradually improved clinically with improved BP, improved mentation, improved renal function On 08/08/21, patient was more somnolent and became hypotensive.  His antibiotics were broadened.  Further goals of care discussion was held with spouse at bedside and patient was transitioned to DNR.   Assessment and Plan: * Severe sepsis (HCC) -Heart rate 100, respiratory rate 30-44, lactic acidosis 6.3, AKI 2.24 -Bacteremia, acute cholecystitis and aspiration pneumonitis -Continue IV Rocephin -WBC's trending down after chole tube initially, now climbing again -Off BPAP and with good saturation demonstrated on 4-5 L [place] supplementation with great saturation. -COVID and flu are negative 08/08/21--WBC climbing back up, hypotension--started levophed ---weaned off 08/09/21 pm ---repeat blood culture--neg to date ---UA no significant pyuria ---CXR no consolidation ---continue empiric vanc, merrem ---PCT 1.37 ---lactate 1.1 --6/20 CT abd/pelvis---New dense consolidation of the RIGHT middle lobe and RIGHT lower lobe.  Bacteremia due to group B Streptococcus 6/15 Repeat blood culture--GPC 1 of 2 sets Echo--EF 70-75%, no WMA, G1DD, no vegetation, trivial MR, mild TR Source - Gallbladder Repeat blood cultures--neg for GBS   Acute cholecystitis -as mentioned, patient not a candidate for surgery - IR placed cholecystostomy tube 08/02/21 -Continue supportive care and IV antibiotics.  AKI (acute kidney injury) ([place]) -Creatinine normally in the 0.6-0.8 range at baseline; at time of admission 2.24 and peaked at 2.72; now down to 1.36.>>1.22>>1.02>>0.92 -due to volume depletion and sepsis/hemodynamic changes  Thrombocytopenia (HCC) -due to sepsis -Platelets count nadir 70sK -improving with sepsis  tx -restart heparin [place]  Acute respiratory failure with hypoxia ([place]) -Patient with good saturation on 1-2 L. -Patient had increasing work of breathing to the point of requiring transient  use of BiPAP at time of admission. -Continue current IV antibiotics -now stable on RA  Goals of care, counseling/discussion Discussed with spouse again 6/19 --discussed patient's overall poor prognosis in setting of multiple co-morbidities and his poor quality of life -discussed his clinical worsening 6/19 despite optimal treatment -spouse expressed that she knows he's suffering with no quality of life -spouse stated that pt told her "I'm ready to go home' -agrees to DNR  Arm edema Venous duplex--neg In part due to third spacing with albumin <1.5  Hypernatremia - Na up to 158>>>137 -d/c D5W -start 1/2 NS>>improved  Acute metabolic encephalopathy -Baseline>>pleasant confusion with hallucinations -Patient with underlying history of mood disorder/dementia and Parkinson. -Continue to be lethargic--but slowly improving -6/11 CT head--neg -ammonia = 41>>22 -6/11 ABG 7.45/32/77/22 on 2L -Palliative care consulted. -remains encephalopathic on 6/18, but more awake and occasionally follows commands -d/c hydromorphone>>>start norco 08/08/21--mental status took down turn again; awake and occasionally speaks ---VBG 7.07/15/69/20 ---[id number] ---6/19 UA no significant pyuria  HLD (hyperlipidemia) -initially held due to poor mentation and elevated LFTs -Holding statin for now.  Lactic acidosis -Secondary to sepsis -Initial lactic acid 6.3, repeat 4.6>> last one 1.1 -improved with IVF  Total bilirubin, elevated -history of biliary obstruction and ERCP back in 2018 -CT abd--bilateral inguinal hernias, herniated bladder on the right, small nonobstructing stone, and gallstones without complication -MRCP--no retained CBD stones or any masses in his liver. -LFTs has continued trending down overall. -Continue IV antibiotics -After discussing with general surgery>>check HIDA scan; which demonstrated positive result for acute cholecystitis and cystic duct obstruction. -not a candidate for  cholecystectomy/surgical intervention. -6/13--cholecystotomy tube>>bili improved  Essential hypertension -Continue holding ARB and  HCTZ due to AKI -Blood pressure has now stabilized -change lopressor IV to po metoprolol>>d/c due to hypotension on 6/19 -BP and HR now controlled.   Diabetes mellitus without complication ([place]) -holding metformin -monitor glucose with CBGs and if it starts trending up will add insulin coverage. -so far CBG's are stable. -6/16 A1C--6.7  Parkinson disease ([place]) -pt on Sinemet when able to take by mouth, but poor mentation precludes po intake initially -Patient with underlying mood disorder and mild hallucinations/psychosis as per patient's wife reports>>>worse over past 6-12 months at baseline -Unable to place NG tube on 07/30/21 (very traumatic and never able to passed from his neck). -now more awake -speech eval>>dys 1 with thin        Family Communication:   spouse and daughter updated 6/20   Consultants:  palliative   Code Status:  DNR   DVT Prophylaxis:  Heparin [place]     Procedures: As Listed in Progress Note Above   Antibiotics: Ceftriaxone 6/11>>6/19 Cefepime 6/8>>6/11 Vanc 6/19>> Merrem 6/19>>         Subjective: Patient is somnolent but arouses to voice.  Denies cp, sob, n/v/d  Objective: Vitals:   08/09/21 1700 08/09/21 1730 08/09/21 1800 08/09/21 1830  BP: (!) 106/30 (!) 119/33 (!) 120/34 (!) 123/35  Pulse: 93 88 89 89  Resp: (!) 32 (!) 33 (!) 33 (!) 36  Temp: 98.7 F (37.1 C)     TempSrc: Axillary     SpO2: 91% 96% 96% 96%  Weight:      Height:        Intake/Output Summary (Last 24 hours) at 08/09/2021 1936 Last data  filed at 08/09/2021 1720 Gross per 24 hour  Intake 1353.21 ml  Output 1785 ml  Net -431.79 ml   Weight change: -0.1 kg Exam:  General:  Pt is alert, intermittently follows commands appropriately, not in acute distress HEENT: No icterus, No thrush, No neck mass, [place]/AT Cardiovascular: RRR,  S1/S2, no rubs, no gallops Respiratory: bibasilar rales.  No wheeze Abdomen: Soft/+BS, non tender, non distended, no guarding Extremities: 3 + LE edema, No lymphangitis, No petechiae, No rashes, no synovitis   Data Reviewed: I have personally reviewed following labs and imaging studies Basic Metabolic Panel: Recent Labs  Lab 08/04/21 0406 08/04/21 0616 08/05/21 0327 08/06/21 0353 08/07/21 0443 08/07/21 1426 08/08/21 0339 08/08/21 1354 08/09/21 0404  NA 151*   < > 154* 145 137 132*  --  135 136  K 3.7   < > 4.1 4.0 4.1 4.3  --  4.4 4.0  CL 127*   < > 128* 120* 112* 110  --  112* 114*  CO2 22   < > 23 23 21* 18*  --  20* 20*  GLUCOSE 571*   < > 203* 263* 237* 233*  --  141* 127*  BUN 54*   < > 52* 34* 28* 27*  --  33* 32*  CREATININE 1.25*   < > 1.22 1.02 0.92 0.89 0.91 0.97 0.90  CALCIUM 6.9*   < > 7.5* 7.1* 7.1* 7.2*  --  7.3* 7.6*  MG 2.2  --  2.3  --  1.8  --  1.8  --   --    < > = values in this interval not displayed.   Liver Function Tests: Recent Labs  Lab 08/03/21 0353 08/07/21 1426 08/08/21 1354 08/09/21 0404  AST 23 35 34 28  ALT 22 <5 <5 <5  ALKPHOS 131* 117 125 116  BILITOT 2.1* 1.3* 1.0 1.0  PROT 5.7* 6.2* 5.9* 6.0*  ALBUMIN 1.9* 1.6* <1.5* <1.5*   No results for input(s): "LIPASE", "AMYLASE" in the last 168 hours. Recent Labs  Lab 08/05/21 0327 08/08/21 1530  AMMONIA 22 14   Coagulation Profile: No results for input(s): "INR", "PROTIME" in the last 168 hours. CBC: Recent Labs  Lab 08/05/21 0327 08/06/21 0353 08/07/21 0443 08/08/21 0339 08/09/21 0404  WBC 16.5* 16.7* 16.5* 18.1* 14.8*  NEUTROABS  --   --   --   --  11.6*  HGB 9.4* 8.6* 8.2* 7.9* 7.4*  HCT 30.5* 27.0* 25.0* 24.7* 22.8*  MCV 100.7* 98.5 96.5 98.4 98.3  PLT 270 327 386 485* 517*   Cardiac Enzymes: No results for input(s): "CKTOTAL", "CKMB", "CKMBINDEX", "TROPONINI" in the last 168 hours. BNP: Invalid input(s): "POCBNP" CBG: Recent Labs  Lab 08/08/21 1116  08/08/21 1542 08/09/21 0048 08/09/21 1130 08/09/21 1659  GLUCAP 92 110* 119* 146* 121*   HbA1C: No results for input(s): "HGBA1C" in the last 72 hours. Urine analysis:    Component Value Date/Time   COLORURINE AMBER (A) 08/08/2021 1707   APPEARANCEUR HAZY (A) 08/08/2021 1707   LABSPEC 1.020 08/08/2021 1707   PHURINE 5.0 08/08/2021 1707   GLUCOSEU NEGATIVE 08/08/2021 1707   HGBUR SMALL (A) 08/08/2021 1707   BILIRUBINUR NEGATIVE 08/08/2021 1707   KETONESUR 5 (A) 08/08/2021 1707   PROTEINUR NEGATIVE 08/08/2021 1707   UROBILINOGEN 0.2 10/14/2009 1851   NITRITE NEGATIVE 08/08/2021 1707   LEUKOCYTESUR NEGATIVE 08/08/2021 1707   Sepsis Labs: '@[email or web address]'$ (procalcitonin:4,lacticidven:4) ) Recent Results (from the past 240 hour(s))  Aerobic/Anaerobic Culture w Gram Stain (surgical/deep  wound)     Status: Abnormal   Collection Time: 08/02/21  2:25 PM   Specimen: Gallbladder; Bile  Result Value Ref Range Status   Specimen Description   Final    GALL BLADDER Performed at [facility], [address] [place], [place] [address]    Special Requests   Final    NONE Performed at [facility], [address] [place], [place] [address]    Gram Stain   Final    NO WBC SEEN FEW GRAM POSITIVE COCCI IN PAIRS FEW GRAM NEGATIVE RODS MODERATE BRANCHING, BEADING  GRAM POSITIVE RODS    Culture (A)  Final    MULTIPLE ORGANISMS PRESENT, NONE PREDOMINANT NO GROUP A STREP (S.PYOGENES) ISOLATED NO STAPHYLOCOCCUS AUREUS ISOLATED NO ANAEROBES ISOLATED Performed at [facility], [place] [address] [place], [place] [address]    Report Status 08/07/2021 FINAL  Final  Culture, blood (Routine X 2) w Reflex to ID Panel     Status: Abnormal   Collection Time: 08/04/21  4:06 AM   Specimen: Right Antecubital; Blood  Result Value Ref Range Status   Specimen Description   Final    RIGHT ANTECUBITAL Performed at [facility], [address] [place], [place] [address]    Special Requests   Final     BOTTLES DRAWN AEROBIC AND ANAEROBIC Blood Culture adequate volume Performed at [facility], [address] [place], [place] [address]    Culture  Setup Time   Final    GRAM POSITIVE COCCI ANAEROBIC BOTTLE ONLY Gram Stain Report Called to,Read Back By and Verified With: MCGIBBONY @ 4034 ON [id number] BY HENDERSON L CRITICAL RESULT CALLED TO, READ BACK BY AND VERIFIED WITH: RN Ralph Dowdy [phone] '@1926'$  FH    Culture (A)  Final    STAPHYLOCOCCUS EPIDERMIDIS THE SIGNIFICANCE OF ISOLATING THIS ORGANISM FROM A SINGLE SET OF BLOOD CULTURES WHEN MULTIPLE SETS ARE DRAWN IS UNCERTAIN. PLEASE NOTIFY THE MICROBIOLOGY DEPARTMENT WITHIN ONE WEEK IF SPECIATION AND SENSITIVITIES ARE REQUIRED. Performed at [facility], [place] [address], [place], [place] [address]    Report Status 08/07/2021 FINAL  Final  Blood Culture ID Panel (Reflexed)     Status: Abnormal   Collection Time: 08/04/21  4:06 AM  Result Value Ref Range Status   Enterococcus faecalis NOT DETECTED NOT DETECTED Final   Enterococcus Faecium NOT DETECTED NOT DETECTED Final   Listeria monocytogenes NOT DETECTED NOT DETECTED Final   Staphylococcus species DETECTED (A) NOT DETECTED Final    Comment: CRITICAL RESULT CALLED TO, READ BACK BY AND VERIFIED WITH: RN Ralph Dowdy [phone] '@1928'$  FH    Staphylococcus aureus (BCID) NOT DETECTED NOT DETECTED Final   Staphylococcus epidermidis DETECTED (A) NOT DETECTED Final    Comment: Methicillin (oxacillin) resistant coagulase negative staphylococcus. Possible blood culture contaminant (unless isolated from more than one blood culture draw or clinical case suggests pathogenicity). No antibiotic treatment is indicated for blood  culture contaminants. CRITICAL RESULT CALLED TO, READ BACK BY AND VERIFIED WITH: RN Ralph Dowdy [phone] '@1928'$  FH    Staphylococcus lugdunensis NOT DETECTED NOT DETECTED Final   Streptococcus species NOT DETECTED NOT DETECTED Final   Streptococcus agalactiae NOT DETECTED NOT DETECTED  Final   Streptococcus pneumoniae NOT DETECTED NOT DETECTED Final   Streptococcus pyogenes NOT DETECTED NOT DETECTED Final   A.calcoaceticus-baumannii NOT DETECTED NOT DETECTED Final   Bacteroides fragilis NOT DETECTED NOT DETECTED Final   Enterobacterales NOT DETECTED NOT DETECTED Final   Enterobacter cloacae complex NOT  DETECTED NOT DETECTED Final   Escherichia coli NOT DETECTED NOT DETECTED Final   Klebsiella aerogenes NOT DETECTED NOT DETECTED Final   Klebsiella oxytoca NOT DETECTED NOT DETECTED Final   Klebsiella pneumoniae NOT DETECTED NOT DETECTED Final   Proteus species NOT DETECTED NOT DETECTED Final   Salmonella species NOT DETECTED NOT DETECTED Final   Serratia marcescens NOT DETECTED NOT DETECTED Final   Haemophilus influenzae NOT DETECTED NOT DETECTED Final   Neisseria meningitidis NOT DETECTED NOT DETECTED Final   Pseudomonas aeruginosa NOT DETECTED NOT DETECTED Final   Stenotrophomonas maltophilia NOT DETECTED NOT DETECTED Final   Candida albicans NOT DETECTED NOT DETECTED Final   Candida auris NOT DETECTED NOT DETECTED Final   Candida glabrata NOT DETECTED NOT DETECTED Final   Candida krusei NOT DETECTED NOT DETECTED Final   Candida parapsilosis NOT DETECTED NOT DETECTED Final   Candida tropicalis NOT DETECTED NOT DETECTED Final   Cryptococcus neoformans/gattii NOT DETECTED NOT DETECTED Final   Methicillin resistance mecA/C DETECTED (A) NOT DETECTED Final    Comment: CRITICAL RESULT CALLED TO, READ BACK BY AND VERIFIED WITH: RN Ralph Dowdy [phone] '@1928'$  FH Performed at [facility], [address] [place], [place] [address]   Culture, blood (Routine X 2) w Reflex to ID Panel     Status: None   Collection Time: 08/04/21  6:15 AM   Specimen: BLOOD LEFT HAND  Result Value Ref Range Status   Specimen Description BLOOD LEFT HAND  Final   Special Requests   Final    BOTTLES DRAWN AEROBIC AND ANAEROBIC Blood Culture adequate volume   Culture   Final    NO GROWTH 5  DAYS Performed at [facility], [address] [place], [place] [address]    Report Status 08/09/2021 FINAL  Final  Culture, blood (Routine X 2) w Reflex to ID Panel     Status: None (Preliminary result)   Collection Time: 08/08/21  3:49 PM   Specimen: BLOOD RIGHT HAND  Result Value Ref Range Status   Specimen Description BLOOD RIGHT HAND  Final   Special Requests   Final    BOTTLES DRAWN AEROBIC AND ANAEROBIC Blood Culture results may not be optimal due to an inadequate volume of blood received in culture bottles   Culture  Setup Time   Final    ANAEROBIC BOTTLE ONLY GRAM POSITIVE COCCI Gram Stain Report Called to,Read Back By and Verified With: BRITTANY FOLEY @ 1696 ON 08/09/2021 C VARNER    Culture   Final    NO GROWTH < 24 HOURS Performed at [facility], [address], [place], [place] [address]    Report Status PENDING  Incomplete  Culture, blood (Routine X 2) w Reflex to ID Panel     Status: None (Preliminary result)   Collection Time: 08/08/21  3:49 PM   Specimen: BLOOD  Result Value Ref Range Status   Specimen Description BLOOD PICC LINE  Final   Special Requests   Final    BOTTLES DRAWN AEROBIC AND ANAEROBIC Blood Culture adequate volume   Culture   Final    NO GROWTH < 24 HOURS Performed at [facility], [address], [place], [place] [address]    Report Status PENDING  Incomplete  Urine Culture     Status: Abnormal   Collection Time: 08/08/21  5:07 PM   Specimen: Urine, Clean Catch  Result Value Ref Range Status   Specimen Description   Final    URINE, CLEAN CATCH  Performed at [facility], [address] [place], [place] [address]    Special Requests   Final    NONE Performed at [facility], [address] [place], [place] [address]    Culture (A)  Final    <10,000 COLONIES/mL INSIGNIFICANT GROWTH Performed at [place] [address], [place],  [address]    Report Status 08/09/2021 FINAL  Final     Scheduled Meds:  aspirin  300 mg  Rectal Daily   bisacodyl  10 mg Rectal Daily   budesonide (PULMICORT) nebulizer solution  0.5 mg Nebulization BID   Carbidopa-Levodopa ER  1 tablet Oral Q supper   Carbidopa-Levodopa ER  2 tablet Oral TID   Chlorhexidine Gluconate Cloth  6 each Topical Q0600   heparin injection (subcutaneous)  5,000 Units Subcutaneous Q8H   polyethylene glycol  17 g Oral Daily   sodium chloride flush  10-40 mL Intracatheter Q12H   sodium chloride flush  5-10 mL Intracatheter Q8H   Continuous Infusions:  sodium chloride Stopped (08/09/21 1511)   meropenem (MERREM) IV Stopped (08/09/21 1340)   norepinephrine (LEVOPHED) Adult infusion Stopped (08/09/21 1511)   vancomycin 150 mL/hr at 08/09/21 1534    Procedures/Studies: CT ABDOMEN PELVIS W CONTRAST  Result Date: 08/09/2021 CLINICAL DATA:  Abdominal pain.  Dyspnea EXAM: CT ABDOMEN AND PELVIS WITH CONTRAST TECHNIQUE: Multidetector CT imaging of the abdomen and pelvis was performed using the standard protocol following bolus administration of intravenous contrast. RADIATION DOSE REDUCTION: This exam was performed according to the departmental dose-optimization program which includes automated exposure control, adjustment of the mA and/or kV according to patient size and/or use of iterative reconstruction technique. CONTRAST:  169m OMNIPAQUE IOHEXOL 300 MG/ML  SOLN COMPARISON:  07/28/2021, CT abdomen FINDINGS: Lower chest: Interval new dense consolidation within the RIGHT lower lobe and RIGHT middle lobe. Small bilateral pleural effusions Hepatobiliary: Percutaneous cholecystostomy tube extends into the gallbladder lumen. Gallbladder decompressed. No biliary duct dilatation. Pancreas: Pancreas is normal. No ductal dilatation. No pancreatic inflammation. Spleen: Normal spleen Adrenals/urinary tract: Adrenal glands normal. Several low-density lesions of the LEFT kidney or favored benign cysts. Ureters normal. A corner of the bladder extends into a RIGHT inguinal hernia.  Bladder herniation extends into the RIGHT hemiscrotum Stomach/Bowel: Stomach is normal. There is inflammation of the duodenum through the C-loop. No perforation. Small bowel normal. Colon normal. Large volume stool in the rectum. Vascular/Lymphatic: Abdominal aorta is normal caliber with atherosclerotic calcification. There is no retroperitoneal or periportal lymphadenopathy. No pelvic lymphadenopathy. Reproductive: Prostate unremarkable Other: No intraperitoneal free air or fluid. Musculoskeletal: Asymmetric thickening of the RIGHT operator muscle (image 70/3). Muscle thickening to 2.6 cm compared to 1 cm cm on the contralateral side. Findings new from several days prior. IMPRESSION: 1. New dense consolidation of the RIGHT middle lobe and RIGHT lower lobe. Differential includes pneumonia versus mucous plugging. Consider CT thorax for further evaluation consider pulmonology consultation 2. Cholecystostomy tube in place without complicating features. 3. Inflammation of the second portion duodenum consistent with duodenitis. This may be secondary to gallbladder inflammation. 4. New thickening of the RIGHT operator muscle. Differential includes infection versus hematoma. No evidence trauma pelvis. Electronically Signed   By: SSuzy BouchardM.D.   On: 08/09/2021 18:20   DG CHEST PORT 1 VIEW  Result Date: 08/08/2021 CLINICAL DATA:  Shortness of breath. EXAM: PORTABLE CHEST 1 VIEW COMPARISON:  July 30, 2021. FINDINGS: The heart size and mediastinal contours are within normal limits. Left lung is clear. Minimal  right basilar subsegmental atelectasis is noted. Right-sided PICC line is noted. The visualized skeletal structures are unremarkable. IMPRESSION: Minimal right basilar subsegmental atelectasis. Electronically Signed   By: Marijo Conception M.D.   On: 08/08/2021 15:20   US Venous Img Upper Bilat (DVT)  Result Date: 08/08/2021 CLINICAL DATA:  81 year old male with bilateral upper extremity edema EXAM:  BILATERAL UPPER EXTREMITY VENOUS DOPPLER ULTRASOUND TECHNIQUE: Gray-scale sonography with graded compression, as well as color Doppler and duplex ultrasound were performed to evaluate the bilateral upper extremity deep venous systems from the level of the subclavian vein and including the jugular, axillary, basilic, radial, ulnar and upper cephalic vein. Spectral Doppler was utilized to evaluate flow at rest and with distal augmentation maneuvers. COMPARISON:  None Available. FINDINGS: RIGHT UPPER EXTREMITY Internal Jugular Vein: Unable to assess the right IJ. Subclavian Vein: Unable to assess Axillary Vein: No evidence of thrombus. Normal compressibility, respiratory phasicity and response to augmentation. Cephalic Vein: No evidence of thrombus. Normal compressibility, respiratory phasicity and response to augmentation. Basilic Vein: No evidence of thrombus. Normal compressibility, respiratory phasicity and response to augmentation. Brachial Veins: No evidence of thrombus. Normal compressibility, respiratory phasicity and response to augmentation. Radial Veins: No evidence of thrombus. Normal compressibility, respiratory phasicity and response to augmentation. Ulnar Veins: No evidence of thrombus. Normal compressibility, respiratory phasicity and response to augmentation. Other Findings:  Edema of the right forearm LEFT UPPER EXTREMITY Internal Jugular Vein: No evidence of thrombus. Normal compressibility, respiratory phasicity and response to augmentation. Subclavian Vein: No evidence of thrombus. Normal compressibility, respiratory phasicity and response to augmentation. Axillary Vein: No evidence of thrombus. Normal compressibility, respiratory phasicity and response to augmentation. Cephalic Vein: No evidence of thrombus. Normal compressibility, respiratory phasicity and response to augmentation. Basilic Vein: No evidence of thrombus. Normal compressibility, respiratory phasicity and response to augmentation.  Brachial Veins: No evidence of thrombus. Normal compressibility, respiratory phasicity and response to augmentation. Radial Veins: No evidence of thrombus. Normal compressibility, respiratory phasicity and response to augmentation. Ulnar Veins: No evidence of thrombus. Normal compressibility, respiratory phasicity and response to augmentation. Other Findings:  Edema IMPRESSION: Directed duplex of the bilateral upper extremity negative for DVT. Edema bilaterally Signed, Dulcy Fanny. Nadene Rubins, RPVI Vascular [facility] Radiology Electronically Signed   By: Corrie Mckusick D.O.   On: 08/08/2021 14:00   ECHOCARDIOGRAM COMPLETE  Result Date: 08/04/2021    ECHOCARDIOGRAM REPORT   Patient Name:   JMICHAEL GILLE Date of Exam: 08/04/2021 Medical Rec #:  [id number]       Height:       70.0 in Accession #:    [id number]      Weight:       199.7 lb Date of Birth:  [birth year]      BSA:          2.086 m Patient Age:    83 years        BP:           139/46 mmHg Patient Gender: M               HR:           81 bpm. Exam Location:  [facility] Procedure: 2D Echo, Cardiac Doppler and Color Doppler Indications:    Bacteremia  History:        Patient has no prior history of Echocardiogram examinations.                 Signs/Symptoms:Bacteremia; Risk  Factors:Hypertension, Diabetes                 and Dyslipidemia. Technically difficult study. Patient with                 Parkinson's disease is unable to follow instructions.  Sonographer:    Wenda Low Referring Phys: [phone] Reida Hem  Sonographer Comments: Technically difficult study due to poor echo windows. Image acquisition challenging due to respiratory motion. IMPRESSIONS  1. Left ventricular ejection fraction, by estimation, is 70 to 75%. The left ventricle has hyperdynamic function. The left ventricle has no regional wall motion abnormalities. Left ventricular diastolic parameters are consistent with Grade I diastolic dysfunction  (impaired relaxation).  2. Right ventricular systolic function is normal. The right ventricular size is normal. There is normal pulmonary artery systolic pressure. The estimated right ventricular systolic pressure is 77.4 mmHg.  3. The mitral valve is grossly normal. Trivial mitral valve regurgitation.  4. The aortic valve is calcified. Aortic valve regurgitation is not visualized. Aortic valve sclerosis/calcification is present, without any evidence of aortic stenosis. Aortic valve mean gradient measures 6.0 mmHg.  5. The inferior vena cava is normal in size with greater than 50% respiratory variability, suggesting right atrial pressure of 3 mmHg. Comparison(s): No prior Echocardiogram. Conclusion(s)/Recommendation(s): No evidence of valvular vegetations on this transthoracic echocardiogram. Consider a transesophageal echocardiogram to exclude infective endocarditis if clinically indicated. FINDINGS  Left Ventricle: Left ventricular ejection fraction, by estimation, is 70 to 75%. The left ventricle has hyperdynamic function. The left ventricle has no regional wall motion abnormalities. The left ventricular internal cavity size was normal in size. There is no left ventricular hypertrophy. Left ventricular diastolic parameters are consistent with Grade I diastolic dysfunction (impaired relaxation). Indeterminate filling pressures. Right Ventricle: The right ventricular size is normal. No increase in right ventricular wall thickness. Right ventricular systolic function is normal. There is normal pulmonary artery systolic pressure. The tricuspid regurgitant velocity is 2.66 m/s, and  with an assumed right atrial pressure of 3 mmHg, the estimated right ventricular systolic pressure is 12.8 mmHg. Left Atrium: Left atrial size was normal in size. Right Atrium: Right atrial size was normal in size. Pericardium: There is no evidence of pericardial effusion. Mitral Valve: The mitral valve is grossly normal. Trivial mitral  valve regurgitation. MV peak gradient, 3.1 mmHg. The mean mitral valve gradient is 1.0 mmHg. Tricuspid Valve: The tricuspid valve is grossly normal. Tricuspid valve regurgitation is mild. Aortic Valve: The aortic valve is calcified. Aortic valve regurgitation is not visualized. Aortic valve sclerosis/calcification is present, without any evidence of aortic stenosis. Aortic valve mean gradient measures 6.0 mmHg. Aortic valve peak gradient measures 10.8 mmHg. Aortic valve area, by VTI measures 2.18 cm. Pulmonic Valve: The pulmonic valve was normal in structure. Pulmonic valve regurgitation is not visualized. Aorta: The aortic root and ascending aorta are structurally normal, with no evidence of dilitation. Venous: The inferior vena cava is normal in size with greater than 50% respiratory variability, suggesting right atrial pressure of 3 mmHg. IAS/Shunts: No atrial level shunt detected by color flow Doppler.  LEFT VENTRICLE PLAX 2D LVIDd:         4.90 cm   Diastology LVIDs:         2.70 cm   LV e' medial:    6.42 cm/s LV PW:         1.10 cm   LV E/e' medial:  10.7 LV IVS:        1.00 cm  LV e' lateral:   12.20 cm/s LVOT diam:     1.90 cm   LV E/e' lateral: 5.7 LV SV:         71 LV SV Index:   34 LVOT Area:     2.84 cm  LEFT ATRIUM           Index LA diam:      3.50 cm 1.68 cm/m LA Vol (A4C): 22.8 ml 10.93 ml/m  AORTIC VALVE AV Area (Vmax):    1.83 cm AV Area (Vmean):   1.73 cm AV Area (VTI):     2.18 cm AV Vmax:           164.00 cm/s AV Vmean:          119.000 cm/s AV VTI:            0.325 m AV Peak Grad:      10.8 mmHg AV Mean Grad:      6.0 mmHg LVOT Vmax:         106.00 cm/s LVOT Vmean:        72.700 cm/s LVOT VTI:          0.250 m LVOT/AV VTI ratio: 0.77  AORTA Ao Root diam: 3.40 cm MITRAL VALVE               TRICUSPID VALVE MV Area (PHT): 2.85 cm    TR Peak grad:   28.3 mmHg MV Area VTI:   3.35 cm    TR Vmax:        266.00 cm/s MV Peak grad:  3.1 mmHg MV Mean grad:  1.0 mmHg    SHUNTS MV Vmax:        0.88 m/s    Systemic VTI:  0.25 m MV Vmean:      48.2 cm/s   Systemic Diam: 1.90 cm MV Decel Time: 266 msec MV E velocity: 69.00 cm/s MV A velocity: 56.10 cm/s MV E/A ratio:  1.23 Lyman Bishop MD Electronically signed by Lyman Bishop MD Signature Date/Time: 08/04/2021/4:53:01 PM    Final    [place] EKG SITE RITE  Result Date: 08/03/2021 If Site Rite image not attached, placement could not be confirmed due to current cardiac rhythm.  IR Perc Cholecystostomy  Result Date: 08/02/2021 INDICATION: 81 year old male referred for percutaneous cholecystostomy EXAM: CHOLECYSTOSTOMY MEDICATIONS: 500 mg levofloxacin; The antibiotic was administered within an appropriate time frame prior to the initiation of the procedure. ANESTHESIA/SEDATION: Moderate (conscious) sedation was employed during this procedure. A total of Versed 0.5 mg and Fentanyl 25 mcg was administered intravenously. Moderate Sedation Time: 11 minutes. The patient's level of consciousness and vital signs were monitored continuously by radiology nursing throughout the procedure under my direct supervision. FLUOROSCOPY TIME:  Fluoroscopy Time: 0 minutes 24 seconds (3 mGy). COMPLICATIONS: None PROCEDURE: Informed written consent was obtained from the patient and the patient's family after a thorough discussion of the procedural risks, benefits and alternatives. All questions were addressed. Maximal Sterile Barrier Technique was utilized including caps, mask, sterile gowns, sterile gloves, sterile drape, hand hygiene and skin antiseptic. A timeout was performed prior to the initiation of the procedure. Ultrasound survey of the right upper quadrant was performed for planning purposes. Once the patient is prepped and draped in the usual sterile fashion, the skin and subcutaneous tissues overlying the gallbladder were generously infiltrated 1% lidocaine for local anesthesia. A coaxial needle was advanced under ultrasound guidance through the skin subcutaneous  tissues and a small segment of liver into  the gallbladder lumen. With removal of the stylet, spontaneous dark bile drainage occurred. Using modified Seldinger technique, a 10 French drain was placed into the gallbladder fossa, with aspiration of the sample for the lab. Contrast injection confirmed position of the tube within the gallbladder lumen. Drainage catheter was attached to gravity drain with a suture retention placed. Patient tolerated the procedure well and remained hemodynamically stable throughout. No complications were encountered and no significant blood loss encountered. IMPRESSION: Status post percutaneous cholecystostomy Signed, Dulcy Fanny. Nadene Rubins, RPVI [facility] Radiology Electronically Signed   By: Corrie Mckusick D.O.   On: 08/02/2021 14:33   NM Hepatobiliary Liver Func  Result Date: 08/01/2021 CLINICAL DATA:  Concern for cholecystitis. EXAM: NUCLEAR MEDICINE HEPATOBILIARY IMAGING TECHNIQUE: Sequential images of the abdomen were obtained out to 60 minutes following intravenous administration of radiopharmaceutical. 3 mm IV morphine administered to augment filling of the gallbladder. RADIOPHARMACEUTICALS:  5.5 mCi Tc-72m Choletec IV COMPARISON:  MRI 07/29/2021 FINDINGS: Uniform accumulation radiotracer within the liver. Counts are evident within the small bowel by 20 minutes. The gallbladder fails to fill at 60 minutes. IV morphine was administered to augment filling of the gallbladder. Post morphine injection, the gallbladder fails to fill. IMPRESSION: 1. Non filling of the gallbladder is consistent with obstruction of cystic duct / acute cholecystitis. 2. Patent common bile duct. These results will be called to the ordering clinician or representative by the Radiologist Assistant, and communication documented in the PACS or [employer] Electronically Signed   By: SSuzy BouchardM.D.   On: 08/01/2021 11:52   CT HEAD WO CONTRAST  (5MM)  Result Date: 07/31/2021 CLINICAL DATA:  Altered mental status EXAM: CT HEAD WITHOUT CONTRAST TECHNIQUE: Contiguous axial images were obtained from the base of the skull through the vertex without intravenous contrast. RADIATION DOSE REDUCTION: This exam was performed according to the departmental dose-optimization program which includes automated exposure control, adjustment of the mA and/or kV according to patient size and/or use of iterative reconstruction technique. COMPARISON:  03/19/2021 FINDINGS: Brain: No acute intracranial findings are seen. There are no signs of bleeding within the cranium. Cortical sulci are prominent more so in the left cerebral hemisphere. There is no focal effacement of cortical sulci. Vascular: Unremarkable. Skull: Unremarkable. Sinuses/Orbits: There is mucosal thickening in the ethmoid sinus. Other: None. IMPRESSION: No acute intracranial findings are seen in noncontrast CT brain. Atrophy. Electronically Signed   By: PElmer PickerM.D.   On: 07/31/2021 14:40   DG Chest Port 1 View  Addendum Date: 07/30/2021   ADDENDUM REPORT: 07/30/2021 14:27 ADDENDUM: Voice recognition error in the impression. For the first sentence, the NG tube should be described as malpositioned, not well positioned. NG tube curls in the neck and does not extend into the thorax and will need to be replaced/repositioned. Electronically Signed   By: DLajean ManesM.D.   On: 07/30/2021 14:27   Result Date: 07/30/2021 CLINICAL DATA:  NG tube placement. EXAM: PORTABLE CHEST 1 VIEW COMPARISON:  07/29/2021. FINDINGS: NG tube is partly visualized, curled in the neck, presumably in the oropharynx. Cardiac silhouette normal in size. No mediastinal or hilar masses. Lungs demonstrate prominent bronchovascular markings, otherwise clear. No pleural effusion or pneumothorax. IMPRESSION: 1. NG tube well positioned, curled in the neck, presumably in the oropharynx. 2. No acute cardiopulmonary disease.  Electronically Signed: By: DLajean ManesM.D. On: 07/30/2021 13:46   DG Abd 1 View  Result Date: 07/30/2021 CLINICAL DATA:  Gastric distension EXAM: ABDOMEN - 1 VIEW COMPARISON:  None Available. FINDINGS: The bowel gas pattern is nonobstructive. Mild gaseous distension of stomach and bowel. There are no abnormal calcifications overlying the kidneys. Pelvic phleboliths. Chronic right anterolateral tenth eleventh rib injuries. Degenerative changes of the spine with levoconvex lumbar curvature. IMPRESSION: No evidence of bowel obstruction. Electronically Signed   By: Maurine Simmering M.D.   On: 07/30/2021 11:33   MR ABDOMEN MRCP WO CONTRAST  Result Date: 07/29/2021 CLINICAL DATA:  Upper abdominal pain. Dilated common bile duct on ultrasound. EXAM: MRI ABDOMEN WITHOUT CONTRAST  (INCLUDING MRCP) TECHNIQUE: Multiplanar multisequence MR imaging of the abdomen was performed. Heavily T2-weighted images of the biliary and pancreatic ducts were obtained, and three-dimensional MRCP images were rendered by post processing. COMPARISON:  Ultrasound 07/29/2021 FINDINGS: Motion degradation exam. ICU patient. ICU patients have difficulty following breath holding commands. Lower chest:  Small bilateral pleural effusions Hepatobiliary: No intrahepatic biliary duct dilatation. Common bile duct normal caliber. The no gallstones identified. Small amount sludge within the gallbladder. No gallbladder distension or inflammation. Pancreas: Normal pancreatic parenchymal intensity. No ductal dilatation or inflammation. Spleen: Normal spleen. Adrenals/urinary tract: Adrenal glands and kidneys are normal. Stomach/Bowel: Stomach and limited of the small bowel is unremarkable Vascular/Lymphatic: Abdominal aortic normal caliber. No retroperitoneal periportal lymphadenopathy. Musculoskeletal: No aggressive osseous lesion IMPRESSION: 1. Normal common bile duct. No intrahepatic biliary duct dilatation. 2. Small amount gallbladder sludge.  No evidence  of cholecystitis. 3. Normal pancreas. 4. Bibasilar atelectasis. 5. Imaging degraded by respiratory motion. Electronically Signed   By: Suzy Bouchard M.D.   On: 07/29/2021 19:59   MR 3D Recon At Scanner  Result Date: 07/29/2021 CLINICAL DATA:  Upper abdominal pain. Dilated common bile duct on ultrasound. EXAM: MRI ABDOMEN WITHOUT CONTRAST  (INCLUDING MRCP) TECHNIQUE: Multiplanar multisequence MR imaging of the abdomen was performed. Heavily T2-weighted images of the biliary and pancreatic ducts were obtained, and three-dimensional MRCP images were rendered by post processing. COMPARISON:  Ultrasound 07/29/2021 FINDINGS: Motion degradation exam. ICU patient. ICU patients have difficulty following breath holding commands. Lower chest:  Small bilateral pleural effusions Hepatobiliary: No intrahepatic biliary duct dilatation. Common bile duct normal caliber. The no gallstones identified. Small amount sludge within the gallbladder. No gallbladder distension or inflammation. Pancreas: Normal pancreatic parenchymal intensity. No ductal dilatation or inflammation. Spleen: Normal spleen. Adrenals/urinary tract: Adrenal glands and kidneys are normal. Stomach/Bowel: Stomach and limited of the small bowel is unremarkable Vascular/Lymphatic: Abdominal aortic normal caliber. No retroperitoneal periportal lymphadenopathy. Musculoskeletal: No aggressive osseous lesion IMPRESSION: 1. Normal common bile duct. No intrahepatic biliary duct dilatation. 2. Small amount gallbladder sludge.  No evidence of cholecystitis. 3. Normal pancreas. 4. Bibasilar atelectasis. 5. Imaging degraded by respiratory motion. Electronically Signed   By: Suzy Bouchard M.D.   On: 07/29/2021 19:59   DG CHEST PORT 1 VIEW  Result Date: 07/29/2021 CLINICAL DATA:  Shortness of breath.  Chest pain.  Diaphoresis. EXAM: PORTABLE CHEST 1 VIEW COMPARISON:  07/29/2021 FINDINGS: Low lung volumes are present, causing crowding of the pulmonary vasculature. There  is likely mild subsegmental atelectasis along both hemidiaphragms. Mild cardiomegaly. Prominence of the right upper mediastinal margin, cannot exclude adenopathy or nodule. IMPRESSION: 1. Low lung volumes and suspected mild atelectasis at the lung bases. 2. Somewhat nodular prominence the right upper mediastinum, cannot exclude adenopathy or nodule although this might possibly be from tortuous vasculature. Chest CT could be utilized for further characterization. 3. Mild cardiomegaly. Electronically Signed   By: Cindra Eves.D.  On: 07/29/2021 10:05   US Abdomen Limited RUQ (LIVER/GB)  Result Date: 07/29/2021 CLINICAL DATA:  Abnormal LFTs EXAM: ULTRASOUND ABDOMEN LIMITED RIGHT UPPER QUADRANT COMPARISON:  CT abdomen and pelvis 07/28/2021 FINDINGS: Gallbladder: Moderately distended and contains echogenic calculi and sludge dependently. 5 mm echogenic likely polyp at the fundus. No significant wall thickening or pericholecystic fluid identified. Common bile duct: Diameter: 8 mm, upper normal for the patient's age. Liver: No focal lesion identified. Within normal limits in parenchymal echogenicity. Portal vein is patent on color Doppler imaging with normal direction of blood flow towards the liver. Other: None. IMPRESSION: 1. Cholelithiasis and sludge. 2. 5 mm likely polyp in the gallbladder. 3. Upper normal caliber of the common bile duct and moderate distention of the gallbladder. Correlate clinically and consider MRCP if indicated. Electronically Signed   By: Ofilia Neas M.D.   On: 07/29/2021 09:56   DG CHEST PORT 1 VIEW  Result Date: 07/29/2021 CLINICAL DATA:  Wheezing EXAM: PORTABLE CHEST 1 VIEW COMPARISON:  07/28/2021 FINDINGS: Limited low volume chest with generalized interstitial coarsening/crowding. No Kerley lines or pleural effusion. Normal heart size and mediastinal contours. Extensive artifact from EKG leads IMPRESSION: Limited low volume chest that is stable from yesterday  Electronically Signed   By: Jorje Guild M.D.   On: 07/29/2021 06:48   DG Wrist Complete Left  Result Date: 07/29/2021 CLINICAL DATA:  Wrist pain EXAM: LEFT WRIST - COMPLETE 3+ VIEW COMPARISON:  None Available. FINDINGS: No fracture or malalignment. Vascular calcifications. Advanced joint space narrowing of the radiocarpal joint. No erosions. IMPRESSION: 1. 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 2. No acute osseous abnormality Electronically Signed   By: Donavan Foil M.D.   On: 07/29/2021 00:09   CT ABDOMEN PELVIS WO CONTRAST  Result Date: 07/28/2021 CLINICAL DATA:  Abdominal pain EXAM: CT ABDOMEN AND PELVIS WITHOUT CONTRAST TECHNIQUE: Multidetector CT imaging of the abdomen and pelvis was performed following the standard protocol without IV contrast. RADIATION DOSE REDUCTION: This exam was performed according to the departmental dose-optimization program which includes automated exposure control, adjustment of the mA and/or kV according to patient size and/or use of iterative reconstruction technique. COMPARISON:  07/26/2021 FINDINGS: Lower chest: Small pleural effusions are noted bilaterally slightly increased when compared with the prior exam. Mild bibasilar atelectasis is seen. Hepatobiliary: Liver is within normal limits. Dependent gallstones are noted within the gallbladder which is well distended. Pneumobilia is again identified consistent with the given clinical history of sphincterotomy. Pancreas: Unremarkable. No pancreatic ductal dilatation or surrounding inflammatory changes. Spleen: Normal in size without focal abnormality. Adrenals/Urinary Tract: Adrenal glands are within normal limits bilaterally. Kidneys show no tiny nonobstructing renal stone on the right. No definitive stones are noted on the left. Hypodense lesion is noted within the left kidney measuring approximately 1.5 cm consistent with small cysts stable in appearance from the prior exam. No obstructive changes are seen. The bladder  is well distended with mildly opacified urine. A portion of the bladder extends into a large right sided inguinal hernia. This is stable in appearance from the prior exam. Stomach/Bowel: Scattered fecal material is noted throughout the colon. Mild diverticular change is seen. The appendix has been surgically removed consistent with the given clinical history. Small bowel and stomach are unremarkable. Vascular/Lymphatic: Aortic atherosclerosis. No enlarged abdominal or pelvic lymph nodes. Reproductive: Prostate is unremarkable. Other: No ascites is noted. Bilateral fat containing inguinal hernias are seen. Persistent herniated bladder into the right hernia is noted. Musculoskeletal: Degenerative changes of lumbar spine  are seen. IMPRESSION: Small nonobstructing right renal stone. Bilateral inguinal hernias with herniated bladder on the right stable from the prior study. Multiple small gallstones without complicating factors. No other focal abnormality is noted. Electronically Signed   By: Inez Catalina M.D.   On: 07/28/2021 23:58   DG Chest Port 1 View  Result Date: 07/28/2021 CLINICAL DATA:  Respiratory distress EXAM: PORTABLE CHEST 1 VIEW COMPARISON:  07/26/2021 FINDINGS: Hypoventilatory changes. No consolidation or effusion. Stable cardiomediastinal silhouette. No pneumothorax IMPRESSION: No active disease.  Low lung volumes. Electronically Signed   By: Donavan Foil M.D.   On: 07/28/2021 22:42   DG Chest Port 1 View  Result Date: 07/26/2021 CLINICAL DATA:  leukocytosis, back pain, poss PNA? EXAM: PORTABLE CHEST 1 VIEW COMPARISON:  Radiograph 08/01/2018 FINDINGS: Unchanged cardiomediastinal silhouette. Low lung volumes. No focal airspace disease. No pleural effusion. No pneumothorax. 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 IMPRESSION: Low lung volumes.  No focal airspace disease. High-riding humeral heads bilaterally can be seen in distal rotator cuff tendinopathy.  Electronically Signed   By: Maurine Simmering M.D.   On: 07/26/2021 14:09   CT Abdomen Pelvis W Contrast  Result Date: 07/26/2021 CLINICAL DATA:  Abdominal pain, acute, nonlocalized dementia, prior abd surgery, back? pain, diaphoresis, concern for aorta vs. nephrolith EXAM: CT ABDOMEN AND PELVIS WITH CONTRAST TECHNIQUE: Multidetector CT imaging of the abdomen and pelvis was performed using the standard protocol following bolus administration of intravenous contrast. RADIATION DOSE REDUCTION: This exam was performed according to the departmental dose-optimization program which includes automated exposure control, adjustment of the mA and/or kV according to patient size and/or use of iterative reconstruction technique. CONTRAST:  3m OMNIPAQUE IOHEXOL 300 MG/ML  SOLN COMPARISON:  CT 12/23/2015 FINDINGS: Lower chest: No acute abnormality. Hepatobiliary: No focal liver abnormality is seen. Mild gallbladder distension with layering sludge or tiny stones. There is pneumobilia consistent with history of prior ERCP and sphincterotomy. Pancreas: Unremarkable. No pancreatic ductal dilatation or surrounding inflammatory changes. Spleen: Normal in size without focal abnormality. Adrenals/Urinary Tract: Adrenal glands are unremarkable. No hydronephrosis or nephrolithiasis. There are nonobstructive bilateral renal stones. Unchanged bilateral renal cysts. There is a right inguinal hernia containing herniated bladder. Stomach/Bowel: The stomach is within normal limits. There is no evidence of bowel obstruction.Prior appendectomy. Scattered colonic diverticula. No diverticulitis. Moderate rectal stool burden. Vascular/Lymphatic: Aortoiliac atherosclerosis. No AAA. No lymphadenopathy. Reproductive: Unremarkable. Other: Right inguinal hernia containing hernia bladder. Fat containing left inguinal hernia. Unchanged anterior abdominal wall scarring. Musculoskeletal: Levoconvex upper lumbar and dextroconvex lower lumbar curvatures. 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 There is severe multilevel facet arthropathy. Mild bilateral hip osteoarthritis. IMPRESSION: Nonobstructive 3 mm renal stones bilaterally. No hydronephrosis or ureterolithiasis. Right inguinal hernia containing distended herniated bladder. Mild gallbladder is tension with layering sludge or small stones. No other findings to suggest cholecystitis by CT. 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 Aortoiliac atherosclerosis.  No AAA. Moderate rectal stool burden. Electronically Signed   By: JMaurine SimmeringM.D.   On: 07/26/2021 10:10    DOrson Eva DO  [facility]  If 7PM-7AM, please contact night-coverage [email or web address] Password [facility]6/20/2023, 7:36 PM   LOS: 11 days

## 2021-08-10 DIAGNOSIS — G9341 Metabolic encephalopathy: Secondary | ICD-10-CM | POA: Diagnosis not present

## 2021-08-10 DIAGNOSIS — R6520 Severe sepsis without septic shock: Secondary | ICD-10-CM | POA: Diagnosis not present

## 2021-08-10 DIAGNOSIS — A419 Sepsis, unspecified organism: Secondary | ICD-10-CM | POA: Diagnosis not present

## 2021-08-10 DIAGNOSIS — J9601 Acute respiratory failure with hypoxia: Secondary | ICD-10-CM | POA: Diagnosis not present

## 2021-08-10 DIAGNOSIS — K810 Acute cholecystitis: Secondary | ICD-10-CM | POA: Diagnosis not present

## 2021-08-10 DIAGNOSIS — Z515 Encounter for palliative care: Secondary | ICD-10-CM | POA: Diagnosis not present

## 2021-08-10 LAB — CBC
HCT: 21.7 % — ABNORMAL LOW (ref 39.0–52.0)
Hemoglobin: 7 g/dL — ABNORMAL LOW (ref 13.0–17.0)
MCH: 31.8 pg (ref 26.0–34.0)
MCHC: 32.3 g/dL (ref 30.0–36.0)
MCV: 98.6 fL (ref 80.0–100.0)
Platelets: 496 10*3/uL — ABNORMAL HIGH (ref 150–400)
RBC: 2.2 MIL/uL — ABNORMAL LOW (ref 4.22–5.81)
RDW: 15.3 % (ref 11.5–15.5)
WBC: 13.6 10*3/uL — ABNORMAL HIGH (ref 4.0–10.5)
nRBC: 0.1 % (ref 0.0–0.2)

## 2021-08-10 LAB — COMPREHENSIVE METABOLIC PANEL
ALT: 6 U/L (ref 0–44)
AST: 30 U/L (ref 15–41)
Albumin: <1.5 g/dL — ABNORMAL LOW (ref 3.5–5.0)
Alkaline Phosphatase: 131 U/L — ABNORMAL HIGH (ref 38–126)
Anion gap: 1 — ABNORMAL LOW (ref 5–15)
BUN: 34 mg/dL — ABNORMAL HIGH (ref 8–23)
CO2: 20 mmol/L — ABNORMAL LOW (ref 22–32)
Calcium: 7.9 mg/dL — ABNORMAL LOW (ref 8.9–10.3)
Chloride: 118 mmol/L — ABNORMAL HIGH (ref 98–111)
Creatinine, Ser: 0.92 mg/dL (ref 0.61–1.24)
GFR, Estimated: >60 mL/min (ref >60)
Glucose, Bld: 122 mg/dL — ABNORMAL HIGH (ref 70–99)
Potassium: 4.3 mmol/L (ref 3.5–5.1)
Sodium: 139 mmol/L (ref 135–145)
Total Bilirubin: 0.7 mg/dL (ref 0.3–1.2)
Total Protein: 6.1 g/dL — ABNORMAL LOW (ref 6.5–8.1)

## 2021-08-10 LAB — GLUCOSE, CAPILLARY
Glucose-Capillary: 116 mg/dL — ABNORMAL HIGH (ref 70–99)
Glucose-Capillary: 126 mg/dL — ABNORMAL HIGH (ref 70–99)

## 2021-08-10 LAB — PROCALCITONIN: Procalcitonin: 1.18 ng/mL

## 2021-08-10 NOTE — Progress Notes (Signed)
PROGRESS NOTE   Elijah [name]  ONG:[id number] DOB: 02-18-1941 DOA: 07/28/2021 PCP: Celene Squibb, MD   Chief Complaint  Patient presents with   Shortness of Breath   Level of care: Stepdown  Brief Admission History:  81 y.o. male with medical history significant of history of chronic low back pain, diabetes mellitus type 2, hyperlipidemia, hypertension, Parkinson disease, obstructive sleep apnea, history of biliary obstruction and pancreatitis with ERCP in 2018, presents ED with chief complaint of dyspnea.  At time of presentation wife was at bedside.  She reported to the ED provider that patient has had 24 hours of shortness of breath and increased work of breathing.  He was recently seen in the ED 2 days ago for back pain, which was acute on chronic.  They had denied fevers at home, but wife did report the patient had left arm pain when she was trying to lift him up.  ED provider note reports that patient denied current abdominal pain.  Patient is not answering questions for me, but does have voluntary guarding with palpation of his abdomen.  Patient arrived in the ER satting in the low 90s on 4 L nasal cannula.  He was put on nonrebreather for work of breathing.  Sepsis protocol was started.  Patient was treated with vancomycin, cefepime, Flagyl, and given a 3 L bolus.  Lactic acid was significantly elevated 6.3, repeat 4.6.  Chest x-ray was negative for acute disease, CT abdomen pelvis did not given definitive explanation of his symptoms either.  Urine was borderline.  Patient did have an AKI.  Admission was requested for further work-up of altered mental status, dyspnea, and sepsis.   Patient was found to have GBS bacteremia with source as acute cholecystitis.  He was not a surgical candidate.  IR placed cholecystotomy tube on 6/13.  His IV antibiotics were continued.  Echo (TTE) was neg for vegetations.  His IVF was adjusted for his hypernatremia and his hydromorphone dosing was decreased with  some improvement of his encephalopathy. Pt gradually improved clinically with improved BP, improved mentation, improved renal function On 08/08/21, patient was more somnolent and became hypotensive.  His antibiotics were broadened.  Further goals of care discussion was held with spouse at bedside and patient was transitioned to DNR.  Unfortunately patient continues to decline despite aggressive therapies.  He was briefly placed on norepinephrine infusion overnight due to soft / low BPs.  He is now off norepinephrine infusion.  His oral intake remains very poor.     Assessment and Plan: * Severe sepsis (HCC) -Heart rate 100, respiratory rate 30-44, lactic acidosis 6.3, AKI 2.24 -Bacteremia, acute cholecystitis and aspiration pneumonitis -Continue IV Rocephin -WBC's trending down after chole tube initially, now climbing again -Off BPAP and with good saturation demonstrated on 4-5 L [place] supplementation with great saturation. -COVID and flu are negative 08/08/21--WBC climbing back up, hypotension--started levophed ---weaned off 08/09/21 pm ---repeat blood culture--neg to date ---UA no significant pyuria ---CXR no consolidation ---continue empiric vanc, merrem ---PCT 1.37 ---lactate 1.1 --6/20 CT abd/pelvis---New dense consolidation of the RIGHT middle lobe and RIGHT lower lobe.  Goals of care, counseling/discussion Discussed with spouse at length --discussed patient's overall poor prognosis in setting of multiple co-morbidities and his poor quality of life -discussed his clinical worsening 6/19 despite optimal treatment -spouse expressed that she knows he's suffering with no quality of life -spouse stated that pt told her "I'm ready to go home' -agrees to DNR  Arm edema Venous duplex--neg  In part due to third spacing with albumin <1.5  Hypernatremia - Na up to 158>>>137 -d/c D5W -start 1/2 NS>>improved  Acute cholecystitis -as mentioned, patient not a candidate for surgery - IR  placed cholecystostomy tube 08/02/21 -Continue supportive care and IV antibiotics.  Thrombocytopenia (HCC) -due to sepsis -Platelets count nadir 70sK -improving with sepsis tx -restart heparin [place]  Bacteremia due to group B Streptococcus 6/15 Repeat blood culture--GPC 1 of 2 sets Echo--EF 70-75%, no WMA, G1DD, no vegetation, trivial MR, mild TR Source - Gallbladder Repeat blood cultures--neg for GBS   Acute metabolic encephalopathy -Baseline>>pleasant confusion with hallucinations -Patient with underlying history of mood disorder/dementia and Parkinson. -Continue to be lethargic--but slowly improving -6/11 CT head--neg -ammonia = 41>>22 -6/11 ABG 7.45/32/77/22 on 2L -Palliative care consulted. -remains encephalopathic on 6/18, but more awake and occasionally follows commands -d/c hydromorphone>>>start norco 08/08/21--mental status took down turn again; awake and occasionally speaks ---VBG 7.07/15/69/20 ---[id number] ---6/19 UA no significant pyuria  HLD (hyperlipidemia) -initially held due to poor mentation and elevated LFTs -Holding statin for now.  Lactic acidosis -Secondary to sepsis -Initial lactic acid 6.3, repeat 4.6>> last one 1.1 -improved with IVF  Acute respiratory failure with hypoxia (HCC) -Patient with good saturation on 1-2 L. -Patient had increasing work of breathing to the point of requiring transient use of BiPAP at time of admission. -Continue current IV antibiotics -stable on RA  AKI (acute kidney injury) ([place]) -Creatinine normally in the 0.6-0.8 range at baseline; at time of admission 2.24 and peaked at 2.72; now down to 1.36.>>1.22>>1.02>>0.92 -due to volume depletion and sepsis/hemodynamic changes  Total bilirubin, elevated -history of biliary obstruction and ERCP back in 2018 -CT abd--bilateral inguinal hernias, herniated bladder on the right, small nonobstructing stone, and gallstones without complication -MRCP--no retained CBD stones or any  masses in his liver. -LFTs has continued trending down overall. -Continue IV antibiotics -After discussing with general surgery>>check HIDA scan; which demonstrated positive result for acute cholecystitis and cystic duct obstruction. -not a candidate for cholecystectomy/surgical intervention. -6/13--cholecystotomy tube>>bili improved  Essential hypertension -Continue holding ARB and  HCTZ due to AKI -Blood pressure has now stabilized -change lopressor IV to po metoprolol>>d/c due to hypotension on 6/19 -BP and HR will be monitored closely in SDU -Pt was briefly on norepinephrine infusion 6/20 but now OFF norepi    Diabetes mellitus without complication (HCC) -holding metformin -monitor glucose with CBGs and if it starts trending up will add insulin coverage. -so far CBG's are stable. -6/16 A1C--6.7  Parkinson disease ([place]) -pt on Sinemet when able to take by mouth, but poor mentation precludes po intake initially -Patient with underlying mood disorder and mild hallucinations/psychosis as per patient's wife reports>>>worse over past 6-12 months at baseline -Unable to place NG tube on 07/30/21 (very traumatic and never able to passed from his neck). -now more awake -speech eval>>dys 1 with thin  DVT prophylaxis: sQ heparin Code Status: DNR  Family Communication:  Disposition: Status is: Inpatient Remains inpatient appropriate because: IV antibiotics required    Consultants:  Palliative Surgery  Procedures:   Antimicrobials:   Ceftriaxone 6/11>>6/19 Cefepime 6/8>>6/11 Vanc 6/19>> Merrem 6/19>>  Subjective: Pt too confused to answer appropriately today.  Objective: Vitals:   08/10/21 0703 08/10/21 0712 08/10/21 0800 08/10/21 1100  BP:   (!) 131/48 (!) 138/41  Pulse: 72     Resp: (!) 24  (!) 28 (!) 30  Temp: 99.5 F (37.5 C)     TempSrc: Axillary     SpO2:  97% 100%    Weight:      Height:        Intake/Output Summary (Last 24 hours) at 08/10/2021 1200 Last data  filed at 08/10/2021 1000 Gross per 24 hour  Intake 2610.45 ml  Output 1700 ml  Net 910.45 ml   Filed Weights   08/08/21 0555 08/09/21 0631 08/10/21 0553  Weight: 94.2 kg 94.1 kg 94.8 kg   Examination:  General exam: appears acutely and chronically ill. Encephalopathic.   Respiratory system: shallow breathing bilateral. Respiratory effort normal. Cardiovascular system: normal S1 & S2 heard.  1+ pedal edema. Gastrointestinal system: Abdomen is mildly distended, soft and tender in RUQ. No organomegaly or masses felt. Normal bowel sounds heard. Central nervous system: somnolent and very confused. No focal neurological deficits. Extremities:  anasarca seen.  Skin: No rashes, lesions or ulcers. Psychiatry: Judgement and insight appear poor. Mood & affect UTD.   Data Reviewed: I have personally reviewed following labs and imaging studies  CBC: Recent Labs  Lab 08/06/21 0353 08/07/21 0443 08/08/21 0339 08/09/21 0404 08/10/21 0322  WBC 16.7* 16.5* 18.1* 14.8* 13.6*  NEUTROABS  --   --   --  11.6*  --   HGB 8.6* 8.2* 7.9* 7.4* 7.0*  HCT 27.0* 25.0* 24.7* 22.8* 21.7*  MCV 98.5 96.5 98.4 98.3 98.6  PLT 327 386 485* 517* 496*    Basic Metabolic Panel: Recent Labs  Lab 08/04/21 0406 08/04/21 0616 08/05/21 0327 08/06/21 0353 08/07/21 0443 08/07/21 1426 08/08/21 0339 08/08/21 1354 08/09/21 0404 08/10/21 0322  NA 151*   < > 154*   < > 137 132*  --  135 136 139  K 3.7   < > 4.1   < > 4.1 4.3  --  4.4 4.0 4.3  CL 127*   < > 128*   < > 112* 110  --  112* 114* 118*  CO2 22   < > 23   < > 21* 18*  --  20* 20* 20*  GLUCOSE 571*   < > 203*   < > 237* 233*  --  141* 127* 122*  BUN 54*   < > 52*   < > 28* 27*  --  33* 32* 34*  CREATININE 1.25*   < > 1.22   < > 0.92 0.89 0.91 0.97 0.90 0.92  CALCIUM 6.9*   < > 7.5*   < > 7.1* 7.2*  --  7.3* 7.6* 7.9*  MG 2.2  --  2.3  --  1.8  --  1.8  --   --   --    < > = values in this interval not displayed.    CBG: Recent Labs  Lab  08/09/21 0048 08/09/21 1130 08/09/21 1659 08/10/21 0058 08/10/21 0636  GLUCAP 119* 146* 121* 116* 126*    Recent Results (from the past 240 hour(s))  Aerobic/Anaerobic Culture w Gram Stain (surgical/deep wound)     Status: Abnormal   Collection Time: 08/02/21  2:25 PM   Specimen: Gallbladder; Bile  Result Value Ref Range Status   Specimen Description   Final    GALL BLADDER Performed at [facility], [address], [place], [place] [address]    Special Requests   Final    NONE Performed at [facility], [address] [place], [place] [address]    Gram Stain   Final    NO WBC SEEN FEW GRAM POSITIVE COCCI IN PAIRS FEW GRAM NEGATIVE RODS MODERATE BRANCHING, BEADING  GRAM POSITIVE RODS    Culture (A)  Final    MULTIPLE ORGANISMS PRESENT, NONE PREDOMINANT NO GROUP A STREP (S.PYOGENES) ISOLATED NO STAPHYLOCOCCUS AUREUS ISOLATED NO ANAEROBES ISOLATED Performed at [facility], [place] [address] [place], [place] [address]    Report Status 08/07/2021 FINAL  Final  Culture, blood (Routine X 2) w Reflex to ID Panel     Status: Abnormal   Collection Time: 08/04/21  4:06 AM   Specimen: Right Antecubital; Blood  Result Value Ref Range Status   Specimen Description   Final    RIGHT ANTECUBITAL Performed at [facility], [address], [place], [place] [address]    Special Requests   Final    BOTTLES DRAWN AEROBIC AND ANAEROBIC Blood Culture adequate volume Performed at [facility], [address] [place], [place] [address]    Culture  Setup Time   Final    GRAM POSITIVE COCCI ANAEROBIC BOTTLE ONLY Gram Stain Report Called to,Read Back By and Verified With: MCGIBBONY @ 9937 ON [id number] BY HENDERSON L CRITICAL RESULT CALLED TO, READ BACK BY AND VERIFIED WITH: RN Ralph Dowdy [phone] '@19[birth year]'$  FH    Culture (A)  Final    STAPHYLOCOCCUS EPIDERMIDIS THE SIGNIFICANCE OF ISOLATING THIS ORGANISM FROM A SINGLE SET OF BLOOD CULTURES WHEN MULTIPLE SETS ARE DRAWN IS UNCERTAIN. PLEASE NOTIFY  THE MICROBIOLOGY DEPARTMENT WITHIN ONE WEEK IF SPECIATION AND SENSITIVITIES ARE REQUIRED. Performed at [facility], [place] [address], [place], [place] [address]    Report Status 08/07/2021 FINAL  Final  Blood Culture ID Panel (Reflexed)     Status: Abnormal   Collection Time: 08/04/21  4:06 AM  Result Value Ref Range Status   Enterococcus faecalis NOT DETECTED NOT DETECTED Final   Enterococcus Faecium NOT DETECTED NOT DETECTED Final   Listeria monocytogenes NOT DETECTED NOT DETECTED Final   Staphylococcus species DETECTED (A) NOT DETECTED Final    Comment: CRITICAL RESULT CALLED TO, READ BACK BY AND VERIFIED WITH: RN Ralph Dowdy [phone] '@1928'$  FH    Staphylococcus aureus (BCID) NOT DETECTED NOT DETECTED Final   Staphylococcus epidermidis DETECTED (A) NOT DETECTED Final    Comment: Methicillin (oxacillin) resistant coagulase negative staphylococcus. Possible blood culture contaminant (unless isolated from more than one blood culture draw or clinical case suggests pathogenicity). No antibiotic treatment is indicated for blood  culture contaminants. CRITICAL RESULT CALLED TO, READ BACK BY AND VERIFIED WITH: RN Ralph Dowdy [phone] '@[birth year]28'$  FH    Staphylococcus lugdunensis NOT DETECTED NOT DETECTED Final   Streptococcus species NOT DETECTED NOT DETECTED Final   Streptococcus agalactiae NOT DETECTED NOT DETECTED Final   Streptococcus pneumoniae NOT DETECTED NOT DETECTED Final   Streptococcus pyogenes NOT DETECTED NOT DETECTED Final   A.calcoaceticus-baumannii NOT DETECTED NOT DETECTED Final   Bacteroides fragilis NOT DETECTED NOT DETECTED Final   Enterobacterales NOT DETECTED NOT DETECTED Final   Enterobacter cloacae complex NOT DETECTED NOT DETECTED Final   Escherichia coli NOT DETECTED NOT DETECTED Final   Klebsiella aerogenes NOT DETECTED NOT DETECTED Final   Klebsiella oxytoca NOT DETECTED NOT DETECTED Final   Klebsiella pneumoniae NOT DETECTED NOT DETECTED Final   Proteus species NOT  DETECTED NOT DETECTED Final   Salmonella species NOT DETECTED NOT DETECTED Final   Serratia marcescens NOT DETECTED NOT DETECTED Final   Haemophilus influenzae NOT DETECTED NOT DETECTED Final   Neisseria meningitidis NOT DETECTED NOT DETECTED Final   Pseudomonas aeruginosa NOT DETECTED NOT DETECTED Final   Stenotrophomonas maltophilia NOT DETECTED  NOT DETECTED Final   Candida albicans NOT DETECTED NOT DETECTED Final   Candida auris NOT DETECTED NOT DETECTED Final   Candida glabrata NOT DETECTED NOT DETECTED Final   Candida krusei NOT DETECTED NOT DETECTED Final   Candida parapsilosis NOT DETECTED NOT DETECTED Final   Candida tropicalis NOT DETECTED NOT DETECTED Final   Cryptococcus neoformans/gattii NOT DETECTED NOT DETECTED Final   Methicillin resistance mecA/C DETECTED (A) NOT DETECTED Final    Comment: CRITICAL RESULT CALLED TO, READ BACK BY AND VERIFIED WITH: RN Ralph Dowdy [phone] '@1928'$  FH Performed at [place] [address], [place], [place] [address]   Culture, blood (Routine X 2) w Reflex to ID Panel     Status: None   Collection Time: 08/04/21  6:15 AM   Specimen: BLOOD LEFT HAND  Result Value Ref Range Status   Specimen Description BLOOD LEFT HAND  Final   Special Requests   Final    BOTTLES DRAWN AEROBIC AND ANAEROBIC Blood Culture adequate volume   Culture   Final    NO GROWTH 5 DAYS Performed at [facility], [address], [place], [place] [address]    Report Status 08/09/2021 FINAL  Final  Culture, blood (Routine X 2) w Reflex to ID Panel     Status: None (Preliminary result)   Collection Time: 08/08/21  3:49 PM   Specimen: BLOOD RIGHT HAND  Result Value Ref Range Status   Specimen Description   Final    BLOOD RIGHT HAND Performed at [facility], [address], [place], [place] [address]    Special Requests   Final    BOTTLES DRAWN AEROBIC AND ANAEROBIC Blood Culture results may not be optimal due to an inadequate volume of blood received in culture  bottles Performed at [facility], [address], [place], [place] [address]    Culture  Setup Time   Final    ANAEROBIC BOTTLE ONLY GRAM POSITIVE COCCI Gram Stain Report Called to,Read Back By and Verified With: BRITTANY FOLEY @ 1448 ON 08/09/2021 C VARNER IN BOTH AEROBIC AND ANAEROBIC BOTTLES CRITICAL VALUE NOTED.  VALUE IS CONSISTENT WITH PREVIOUSLY REPORTED AND CALLED VALUE. GRAM STAIN REVIEWED-AGREE WITH RESULT    Culture   Final    CULTURE REINCUBATED FOR BETTER GROWTH Performed at [facility], [place] [address], [place], [place] [address]    Report Status PENDING  Incomplete  Culture, blood (Routine X 2) w Reflex to ID Panel     Status: None (Preliminary result)   Collection Time: 08/08/21  3:49 PM   Specimen: BLOOD  Result Value Ref Range Status   Specimen Description BLOOD PICC LINE  Final   Special Requests   Final    BOTTLES DRAWN AEROBIC AND ANAEROBIC Blood Culture adequate volume   Culture   Final    NO GROWTH < 24 HOURS Performed at [facility], [address], [place], [place] [address]    Report Status PENDING  Incomplete  Urine Culture     Status: Abnormal   Collection Time: 08/08/21  5:07 PM   Specimen: Urine, Clean Catch  Result Value Ref Range Status   Specimen Description   Final    URINE, CLEAN CATCH Performed at [facility], [address] [place], [place] [address]    Special Requests   Final    NONE Performed at [facility], [address] [place],  [address]    Culture (A)  Final    <10,000 COLONIES/mL  INSIGNIFICANT GROWTH Performed at [facility], [place] [address], [place], [place] [address]    Report Status 08/09/2021 FINAL  Final     Radiology Studies: CT ABDOMEN PELVIS W CONTRAST  Result Date: 08/09/2021 CLINICAL DATA:  Abdominal pain.  Dyspnea EXAM: CT ABDOMEN AND PELVIS WITH CONTRAST TECHNIQUE: Multidetector CT imaging of the abdomen and pelvis was performed using the standard protocol following bolus administration of  intravenous contrast. RADIATION DOSE REDUCTION: This exam was performed according to the departmental dose-optimization program which includes automated exposure control, adjustment of the mA and/or kV according to patient size and/or use of iterative reconstruction technique. CONTRAST:  153m OMNIPAQUE IOHEXOL 300 MG/ML  SOLN COMPARISON:  07/28/2021, CT abdomen FINDINGS: Lower chest: Interval new dense consolidation within the RIGHT lower lobe and RIGHT middle lobe. Small bilateral pleural effusions Hepatobiliary: Percutaneous cholecystostomy tube extends into the gallbladder lumen. Gallbladder decompressed. No biliary duct dilatation. Pancreas: Pancreas is normal. No ductal dilatation. No pancreatic inflammation. Spleen: Normal spleen Adrenals/urinary tract: Adrenal glands normal. Several low-density lesions of the LEFT kidney or favored benign cysts. Ureters normal. A corner of the bladder extends into a RIGHT inguinal hernia. Bladder herniation extends into the RIGHT hemiscrotum Stomach/Bowel: Stomach is normal. There is inflammation of the duodenum through the C-loop. No perforation. Small bowel normal. Colon normal. Large volume stool in the rectum. Vascular/Lymphatic: Abdominal aorta is normal caliber with atherosclerotic calcification. There is no retroperitoneal or periportal lymphadenopathy. No pelvic lymphadenopathy. Reproductive: Prostate unremarkable Other: No intraperitoneal free air or fluid. Musculoskeletal: Asymmetric thickening of the RIGHT operator muscle (image 70/3). Muscle thickening to 2.6 cm compared to 1 cm cm on the contralateral side. Findings new from several days prior. IMPRESSION: 1. New dense consolidation of the RIGHT middle lobe and RIGHT lower lobe. Differential includes pneumonia versus mucous plugging. Consider CT thorax for further evaluation consider pulmonology consultation 2. Cholecystostomy tube in place without complicating features. 3. Inflammation of the second portion  duodenum consistent with duodenitis. This may be secondary to gallbladder inflammation. 4. New thickening of the RIGHT operator muscle. Differential includes infection versus hematoma. No evidence trauma pelvis. Electronically Signed   By: SSuzy BouchardM.D.   On: 08/09/2021 18:20   DG CHEST PORT 1 VIEW  Result Date: 08/08/2021 CLINICAL DATA:  Shortness of breath. EXAM: PORTABLE CHEST 1 VIEW COMPARISON:  July 30, 2021. FINDINGS: The heart size and mediastinal contours are within normal limits. Left lung is clear. Minimal right basilar subsegmental atelectasis is noted. Right-sided PICC line is noted. The visualized skeletal structures are unremarkable. IMPRESSION: Minimal right basilar subsegmental atelectasis. Electronically Signed   By: JMarijo ConceptionM.D.   On: 08/08/2021 15:20   UKoreaVenous Img Upper Bilat (DVT)  Result Date: 08/08/2021 CLINICAL DATA:  81year old male with bilateral upper extremity edema EXAM: BILATERAL UPPER EXTREMITY VENOUS DOPPLER ULTRASOUND TECHNIQUE: Gray-scale sonography with graded compression, as well as color Doppler and duplex ultrasound were performed to evaluate the bilateral upper extremity deep venous systems from the level of the subclavian vein and including the jugular, axillary, basilic, radial, ulnar and upper cephalic vein. Spectral Doppler was utilized to evaluate flow at rest and with distal augmentation maneuvers. COMPARISON:  None Available. FINDINGS: RIGHT UPPER EXTREMITY Internal Jugular Vein: Unable to assess the right IJ. Subclavian Vein: Unable to assess Axillary Vein: No evidence of thrombus. Normal compressibility, respiratory phasicity and response to augmentation. Cephalic Vein: No evidence of thrombus. Normal compressibility, respiratory phasicity and response to augmentation. Basilic Vein: No evidence  of thrombus. Normal compressibility, respiratory phasicity and response to augmentation. Brachial Veins: No evidence of thrombus. Normal  compressibility, respiratory phasicity and response to augmentation. Radial Veins: No evidence of thrombus. Normal compressibility, respiratory phasicity and response to augmentation. Ulnar Veins: No evidence of thrombus. Normal compressibility, respiratory phasicity and response to augmentation. Other Findings:  Edema of the right forearm LEFT UPPER EXTREMITY Internal Jugular Vein: No evidence of thrombus. Normal compressibility, respiratory phasicity and response to augmentation. Subclavian Vein: No evidence of thrombus. Normal compressibility, respiratory phasicity and response to augmentation. Axillary Vein: No evidence of thrombus. Normal compressibility, respiratory phasicity and response to augmentation. Cephalic Vein: No evidence of thrombus. Normal compressibility, respiratory phasicity and response to augmentation. Basilic Vein: No evidence of thrombus. Normal compressibility, respiratory phasicity and response to augmentation. Brachial Veins: No evidence of thrombus. Normal compressibility, respiratory phasicity and response to augmentation. Radial Veins: No evidence of thrombus. Normal compressibility, respiratory phasicity and response to augmentation. Ulnar Veins: No evidence of thrombus. Normal compressibility, respiratory phasicity and response to augmentation. Other Findings:  Edema IMPRESSION: Directed duplex of the bilateral upper extremity negative for DVT. Edema bilaterally Signed, Dulcy Fanny. Nadene Rubins, RPVI [facility] Radiology Electronically Signed   By: Corrie Mckusick D.O.   On: 08/08/2021 14:00    Scheduled Meds:  aspirin  300 mg Rectal Daily   bisacodyl  10 mg Rectal Daily   budesonide (PULMICORT) nebulizer solution  0.5 mg Nebulization BID   Carbidopa-Levodopa ER  1 tablet Oral Q supper   Carbidopa-Levodopa ER  2 tablet Oral TID   Chlorhexidine Gluconate Cloth  6 each Topical Q0600   heparin injection (subcutaneous)  5,000  Units Subcutaneous Q8H   polyethylene glycol  17 g Oral Daily   sodium chloride flush  10-40 mL Intracatheter Q12H   sodium chloride flush  5-10 mL Intracatheter Q8H   Continuous Infusions:  sodium chloride 75 mL/hr at 08/10/21 0126   meropenem (MERREM) IV Stopped (08/10/21 9937)   norepinephrine (LEVOPHED) Adult infusion Stopped (08/09/21 1511)   vancomycin Stopped (08/10/21 0647)     LOS: 12 days   Critical Care Procedure Note Authorized and Performed by: Murvin Natal MD  Total Critical Care time:  44 mins Due to a high probability of clinically significant, life threatening deterioration, the patient required my highest level of preparedness to intervene emergently and I personally spent this critical care time directly and personally managing the patient.  This critical care time included obtaining a history; examining the patient, pulse oximetry; ordering and review of studies; arranging urgent treatment with development of a management plan; evaluation of patient's response of treatment; frequent reassessment; and discussions with other providers.  This critical care time was performed to assess and manage the high probability of imminent and life threatening deterioration that could result in multi-organ failure.  It was exclusive of separately billable procedures and treating other patients and teaching time.    Irwin Brakeman, MD How to contact the [facility] Attending or Consulting provider [place] or covering provider during after hours [place], for this patient?  Check the care team in [facility] and look for a) attending/consulting TR[facility] provider listed and b) the [facility] team listed Log into [email or web address] and use [place]'s universal password to access. If you do not have the password, please contact the hospital operator. Locate the [facility] provider you are looking for under [facility] and page to a number that you can be directly reached. If you  still have difficulty reaching the provider,  please page the [facility] (Director on Call) for the Hospitalists listed on amion for assistance.  08/10/2021, 12:00 PM

## 2021-08-10 NOTE — Progress Notes (Signed)
Physical Therapy Treatment Patient Details Name: Elijah [name] MRN: [id number] DOB: 07-29-1940 Today's Date: 08/10/2021   History of Present Illness Elijah [name] is a 81 y.o. male with medical history significant of history of chronic low back pain, diabetes mellitus type 2, hyperlipidemia, hypertension, Parkinson disease, obstructive sleep apnea, history of biliary obstruction and pancreatitis with ERCP in 2018, presents ED with chief complaint of dyspnea.  At time of presentation wife was at bedside.  She reported to the ED provider that patient has had 24 hours of shortness of breath and increased work of breathing.  He was recently seen in the ED 2 days ago for back pain, which was acute on chronic.  They had denied fevers at home, but wife did report the patient had left arm pain when she was trying to lift him up.  ED provider note reports that patient denied current abdominal pain.  Patient is not answering questions for me, but does have voluntary guarding with palpation of his abdomen.  Patient arrived in the ER satting in the low 90s on 4 L nasal cannula.  He was put on nonrebreather for work of breathing.  Sepsis protocol was started.  Patient was treated with vancomycin, cefepime, Flagyl, and given a 3 L bolus.  Lactic acid was significantly elevated 6.3, repeat 4.6.  Chest x-ray was negative for acute disease, CT abdomen pelvis did not given definitive explanation of his symptoms either.  Urine was borderline.  Patient did have an AKI.  Admission was requested for further work-up of altered mental status, dyspnea, and most likely sepsis.    PT Comments    Patient demonstrates slow labored movement for sitting up at bedside with limited to no use of BUE/LE due to generalized weakness and severe swelling.  Patient demonstrates fair/poor sitting balance with frequent leaning to the left and unable to attempt sit to stands due to BLE weakness and fatigue.  Patient required 2 person Max  assist reposition when put back to bed.  Patient will benefit from continued skilled physical therapy in hospital and recommended venue below to increase strength, balance, endurance for safe ADLs and gait.     Recommendations for follow up therapy are one component of a multi-disciplinary discharge planning process, led by the attending physician.  Recommendations may be updated based on patient status, additional functional criteria and insurance authorization.  Follow Up Recommendations  Skilled nursing-short term rehab (<3 hours/day) Can patient physically be transported by private vehicle: No   Assistance Recommended at Discharge Intermittent Supervision/Assistance  Patient can return home with the following A lot of help with walking and/or transfers;Assistance with cooking/housework;Assist for transportation;Help with stairs or ramp for entrance;A lot of help with bathing/dressing/bathroom   Equipment Recommendations  None recommended by PT    Recommendations for Other Services       Precautions / Restrictions Precautions Precautions: Fall Restrictions Weight Bearing Restrictions: No     Mobility  Bed Mobility Overal bed mobility: Needs Assistance Bed Mobility: Supine to Sit, Sit to Supine     Supine to sit: Max assist Sit to supine: Max assist   General bed mobility comments: slow labored movement with limited to no use of extremities    Transfers                        Ambulation/Gait                   Stairs  Wheelchair Mobility    Modified Rankin (Stroke Patients Only)       Balance Overall balance assessment: Needs assistance Sitting-balance support: Feet supported, No upper extremity supported Sitting balance-Leahy Scale: Poor Sitting balance - Comments: fair/poor seated at EOB Postural control: Left lateral lean                                  Cognition Arousal/Alertness: Awake/alert Behavior  During Therapy: Flat affect Overall Cognitive Status: Impaired/Different from baseline Area of Impairment: Awareness                               General Comments: requires repeated verbal/tactile cueing to participate with functional activity        Exercises General Exercises - Lower Extremity Long Arc Quad: Seated, AAROM, Strengthening, Both, 5 reps Hip Flexion/Marching: Seated, AAROM, Strengthening, Both, 5 reps    General Comments        Pertinent Vitals/Pain Pain Assessment Pain Assessment: Faces Faces Pain Scale: Hurts little more Pain Location: pressure, movement of legs Pain Descriptors / Indicators: Sore, Grimacing Pain Intervention(s): Limited activity within patient's tolerance, Monitored during session, Repositioned    Home Living                          Prior Function            PT Goals (current goals can now be found in the care plan section) Acute Rehab PT Goals Patient Stated Goal: return home PT Goal Formulation: With patient/family Time For Goal Achievement: 08/22/21 Potential to Achieve Goals: Fair Progress towards PT goals: Progressing toward goals    Frequency    Min 3X/week      PT Plan Current plan remains appropriate    Co-evaluation              AM-PAC PT "6 Clicks" Mobility   Outcome Measure  Help needed turning from your back to your side while in a flat bed without using bedrails?: A Lot Help needed moving from lying on your back to sitting on the side of a flat bed without using bedrails?: A Lot Help needed moving to and from a bed to a chair (including a wheelchair)?: Total Help needed standing up from a chair using your arms (e.g., wheelchair or bedside chair)?: Total Help needed to walk in hospital room?: Total Help needed climbing 3-5 steps with a railing? : Total 6 Click Score: 8    End of Session   Activity Tolerance: Patient tolerated treatment well;Patient limited by  fatigue Patient left: in bed;with call bell/phone within reach Nurse Communication: Mobility status PT Visit Diagnosis: Unsteadiness on feet (R26.81);Other abnormalities of gait and mobility (R26.89);Muscle weakness (generalized) (M62.81)     Time: 1829-9371 PT Time Calculation (min) (ACUTE ONLY): 27 min  Charges:  $Therapeutic Activity: 23-37 mins                     3:52 PM, 08/10/21 Lonell Grandchild, MPT Physical Therapist with [facility] [phone] office [phone] mobile phone

## 2021-08-10 NOTE — Hospital Course (Signed)
81 y.o. male with medical history significant of history of chronic low back pain, diabetes mellitus type 2, hyperlipidemia, hypertension, Parkinson disease, obstructive sleep apnea, history of biliary obstruction and pancreatitis with ERCP in 2018, presents ED with chief complaint of dyspnea.  At time of presentation wife was at bedside.  She reported to the ED provider that patient has had 24 hours of shortness of breath and increased work of breathing.  He was recently seen in the ED 2 days ago for back pain, which was acute on chronic.  They had denied fevers at home, but wife did report the patient had left arm pain when she was trying to lift him up.  ED provider note reports that patient denied current abdominal pain.  Patient is not answering questions for me, but does have voluntary guarding with palpation of his abdomen.  Patient arrived in the ER satting in the low 90s on 4 L nasal cannula.  He was put on nonrebreather for work of breathing.  Sepsis protocol was started.  Patient was treated with vancomycin, cefepime, Flagyl, and given a 3 L bolus.  Lactic acid was significantly elevated 6.3, repeat 4.6.  Chest x-ray was negative for acute disease, CT abdomen pelvis did not given definitive explanation of his symptoms either.  Urine was borderline.  Patient did have an AKI.  Admission was requested for further work-up of altered mental status, dyspnea, and sepsis.   Patient was found to have GBS bacteremia with source as acute cholecystitis.  He was not a surgical candidate.  IR placed cholecystotomy tube on 6/13.  His IV antibiotics were continued.  Echo (TTE) was neg for vegetations.  His IVF was adjusted for his hypernatremia and his hydromorphone dosing was decreased with some improvement of his encephalopathy. Pt gradually improved clinically with improved BP, improved mentation, improved renal function On 08/08/21, patient was more somnolent and became hypotensive.  His antibiotics were  broadened.  Further goals of care discussion was held with spouse at bedside and patient was transitioned to DNR.  Unfortunately patient continues to decline despite aggressive therapies.  He was briefly placed on norepinephrine infusion overnight due to soft / low BPs.  He is now off norepinephrine infusion.  His oral intake remains very poor.

## 2021-08-10 NOTE — Progress Notes (Signed)
Palliative: Elijah [name] is lying quietly in bed.  He appears acutely/chronically ill and frail.  Bedside nursing staff shares that he is answering questions appropriately.  They shared that he only ate a few bites this morning with medicines, but is taking liquids.  He remains critically ill.  Elijah [name] is also receiving physical therapy.  Conference with attending, bedside nursing staff, speech therapy, physical therapy, transition of care team related to patient condition, needs, goals of care, disposition.  Plan:   At this point continue to treat the treatable but no CPR or intubation.  Time for outcomes.  25 minutes  Quinn Axe, NP Palliative medicine team Team phone [phone] Greater than 50% of this time was spent counseling and coordinating care related to the above assessment and plan.

## 2021-08-11 DIAGNOSIS — A419 Sepsis, unspecified organism: Secondary | ICD-10-CM | POA: Diagnosis not present

## 2021-08-11 DIAGNOSIS — K810 Acute cholecystitis: Secondary | ICD-10-CM | POA: Diagnosis not present

## 2021-08-11 DIAGNOSIS — J9601 Acute respiratory failure with hypoxia: Secondary | ICD-10-CM | POA: Diagnosis not present

## 2021-08-11 DIAGNOSIS — G9341 Metabolic encephalopathy: Secondary | ICD-10-CM | POA: Diagnosis not present

## 2021-08-11 LAB — COMPREHENSIVE METABOLIC PANEL
ALT: 6 U/L (ref 0–44)
AST: 26 U/L (ref 15–41)
Albumin: <1.5 g/dL — ABNORMAL LOW (ref 3.5–5.0)
Alkaline Phosphatase: 132 U/L — ABNORMAL HIGH (ref 38–126)
BUN: 36 mg/dL — ABNORMAL HIGH (ref 8–23)
CO2: 21 mmol/L — ABNORMAL LOW (ref 22–32)
Calcium: 7.7 mg/dL — ABNORMAL LOW (ref 8.9–10.3)
Chloride: 116 mmol/L — ABNORMAL HIGH (ref 98–111)
Creatinine, Ser: 0.8 mg/dL (ref 0.61–1.24)
GFR, Estimated: >60 mL/min (ref >60)
Glucose, Bld: 154 mg/dL — ABNORMAL HIGH (ref 70–99)
Potassium: 4.2 mmol/L (ref 3.5–5.1)
Sodium: 137 mmol/L (ref 135–145)
Total Bilirubin: 0.5 mg/dL (ref 0.3–1.2)
Total Protein: 5.6 g/dL — ABNORMAL LOW (ref 6.5–8.1)

## 2021-08-11 LAB — CBC WITH DIFFERENTIAL/PLATELET
Abs Immature Granulocytes: 0.19 10*3/uL — ABNORMAL HIGH (ref 0.00–0.07)
Basophils Absolute: 0 10*3/uL (ref 0.0–0.1)
Basophils Relative: 0 %
Eosinophils Absolute: 0.1 10*3/uL (ref 0.0–0.5)
Eosinophils Relative: 0 %
HCT: 20.2 % — ABNORMAL LOW (ref 39.0–52.0)
Hemoglobin: 6.6 g/dL — CL (ref 13.0–17.0)
Immature Granulocytes: 2 %
Lymphocytes Relative: 12 %
Lymphs Abs: 1.5 10*3/uL (ref 0.7–4.0)
MCH: 32.4 pg (ref 26.0–34.0)
MCHC: 32.7 g/dL (ref 30.0–36.0)
MCV: 99 fL (ref 80.0–100.0)
Monocytes Absolute: 0.8 10*3/uL (ref 0.1–1.0)
Monocytes Relative: 7 %
Neutro Abs: 9.7 10*3/uL — ABNORMAL HIGH (ref 1.7–7.7)
Neutrophils Relative %: 79 %
Platelets: 463 10*3/uL — ABNORMAL HIGH (ref 150–400)
RBC: 2.04 MIL/uL — ABNORMAL LOW (ref 4.22–5.81)
RDW: 15.5 % (ref 11.5–15.5)
WBC: 12.3 10*3/uL — ABNORMAL HIGH (ref 4.0–10.5)
nRBC: 0 % (ref 0.0–0.2)

## 2021-08-11 LAB — ABO/RH: ABO/RH(D): B POS

## 2021-08-11 LAB — SARS CORONAVIRUS 2 BY RT PCR: SARS Coronavirus 2 by RT PCR: NEGATIVE

## 2021-08-11 LAB — CULTURE, BLOOD (ROUTINE X 2)

## 2021-08-11 LAB — PREPARE RBC (CROSSMATCH)

## 2021-08-11 LAB — MAGNESIUM: Magnesium: 1.7 mg/dL (ref 1.7–2.4)

## 2021-08-11 MED ORDER — GLYCOPYRROLATE 0.2 MG/ML IJ SOLN: 0.2000 mg | INTRAMUSCULAR | Status: DC | PRN

## 2021-08-11 MED ORDER — IPRATROPIUM-ALBUTEROL 0.5-2.5 (3) MG/3ML IN SOLN: 3.0000 mL | RESPIRATORY_TRACT | Status: DC | PRN

## 2021-08-11 MED ORDER — DIPHENHYDRAMINE HCL 50 MG/ML IJ SOLN: 12.5000 mg | INTRAMUSCULAR | Status: DC | PRN

## 2021-08-11 MED ORDER — MORPHINE SULFATE (CONCENTRATE) 10 MG/0.5ML PO SOLN: 5.0000 mg | ORAL | Status: DC | PRN

## 2021-08-11 MED ORDER — LORAZEPAM 2 MG/ML IJ SOLN
1.0000 mg | INTRAMUSCULAR | Status: DC | PRN
  Administered 2021-08-11: 1 mg via INTRAVENOUS
  Filled 2021-08-11 (×2): qty 1

## 2021-08-11 MED ORDER — BIOTENE DRY MOUTH MT LIQD: 15.0000 mL | OROMUCOSAL | Status: DC | PRN

## 2021-08-11 MED ORDER — HYDROMORPHONE HCL 1 MG/ML IJ SOLN
0.5000 mg | INTRAMUSCULAR | Status: DC | PRN
  Administered 2021-08-11 (×3): 0.5 mg via INTRAVENOUS
  Filled 2021-08-11 (×4): qty 0.5

## 2021-08-11 MED ORDER — LORAZEPAM 2 MG/ML PO CONC
1.0000 mg | ORAL | Status: DC | PRN
Start: 2021-08-11 — End: 2021-08-11

## 2021-08-11 MED ORDER — CARBIDOPA-LEVODOPA ER 25-100 MG PO TBCR: 1.0000 | EXTENDED_RELEASE_TABLET | Freq: Four times a day (QID) | ORAL | Status: AC

## 2021-08-11 MED ORDER — LORAZEPAM 1 MG PO TABS: 1.0000 mg | ORAL_TABLET | ORAL | Status: DC | PRN

## 2021-08-11 MED ORDER — POLYVINYL ALCOHOL 1.4 % OP SOLN: 1.0000 [drp] | Freq: Four times a day (QID) | OPHTHALMIC | Status: DC | PRN

## 2021-08-11 MED ORDER — GLYCOPYRROLATE 1 MG PO TABS: 1.0000 mg | ORAL_TABLET | ORAL | Status: DC | PRN

## 2021-08-11 MED ORDER — SODIUM CHLORIDE 0.9% IV SOLUTION: Freq: Once | INTRAVENOUS | Status: AC

## 2021-08-11 MED ORDER — HALOPERIDOL LACTATE 2 MG/ML PO CONC: 0.5000 mg | ORAL | Status: DC | PRN

## 2021-08-11 MED ORDER — HALOPERIDOL LACTATE 5 MG/ML IJ SOLN: 0.5000 mg | INTRAMUSCULAR | Status: DC | PRN

## 2021-08-11 MED ORDER — HALOPERIDOL 0.5 MG PO TABS: 0.5000 mg | ORAL_TABLET | ORAL | Status: DC | PRN

## 2021-08-11 NOTE — Discharge Summary (Signed)
Physician Discharge Summary  JILLIAN PIANKA PPJ:[id number] DOB: 23-Nov-1940 DOA: 07/28/2021  PCP: Celene Squibb, MD  Admit date: 07/28/2021 Discharge date: 08/11/2021  Disposition:  RESIDENTIAL HOSPICE   Recommendations for Outpatient Follow-up:  SYMPTOM MANAGEMENT PER HOSPICE PROTOCOL   Discharge Condition: HOSPICE   CODE STATUS: DNR  DIET: COMFORT    Brief Hospitalization Summary: Please see all hospital notes, images, labs for full details of the hospitalization. Brief Admission History:  81 y.o. male with medical history significant of history of chronic low back pain, diabetes mellitus type 2, hyperlipidemia, hypertension, Parkinson disease, obstructive sleep apnea, history of biliary obstruction and pancreatitis with ERCP in 2018, presents ED with chief complaint of dyspnea.  At time of presentation wife was at bedside.  She reported to the ED provider that patient has had 24 hours of shortness of breath and increased work of breathing.  He was recently seen in the ED 2 days ago for back pain, which was acute on chronic.  They had denied fevers at home, but wife did report the patient had left arm pain when she was trying to lift him up.  ED provider note reports that patient denied current abdominal pain.  Patient is not answering questions for me, but does have voluntary guarding with palpation of his abdomen.  Patient arrived in the ER satting in the low 90s on 4 L nasal cannula.  He was put on nonrebreather for work of breathing.  Sepsis protocol was started.  Patient was treated with vancomycin, cefepime, Flagyl, and given a 3 L bolus.  Lactic acid was significantly elevated 6.3, repeat 4.6.  Chest x-ray was negative for acute disease, CT abdomen pelvis did not given definitive explanation of his symptoms either.  Urine was borderline.  Patient did have an AKI.  Admission was requested for further work-up of altered mental status, dyspnea, and sepsis.   Patient was found to have GBS  bacteremia with source as acute cholecystitis.  He was not a surgical candidate.  IR placed cholecystotomy tube on 6/13.  His IV antibiotics were continued.  Echo (TTE) was neg for vegetations.  His IVF was adjusted for his hypernatremia and his hydromorphone dosing was decreased with some improvement of his encephalopathy. Pt gradually improved clinically with improved BP, improved mentation, improved renal function On 08/08/21, patient was more somnolent and became hypotensive.  His antibiotics were broadened.  Further goals of care discussion was held with spouse at bedside and patient was transitioned to DNR.   Unfortunately patient continues to decline despite aggressive therapies.  He was briefly placed on norepinephrine infusion overnight due to soft / low BPs.  He is now off norepinephrine infusion.  His oral intake remains very poor.     Assessment and Plan: * Severe sepsis (HCC) -Heart rate 100, respiratory rate 30-44, lactic acidosis 6.3, AKI 2.24 -Bacteremia, acute cholecystitis and aspiration pneumonitis -Continue IV Rocephin -WBC's trending down after chole tube initially, now climbing again -Off BPAP and with good saturation demonstrated on 4-5 L [place] supplementation with great saturation. -COVID and flu are negative 08/08/21--WBC climbing back up, hypotension--started levophed ---weaned off 08/09/21 pm ---repeat blood culture--neg to date ---UA no significant pyuria ---CXR no consolidation ---continue empiric vanc, merrem ---PCT 1.37 ---lactate 1.1 --6/20 CT abd/pelvis---New dense consolidation of the RIGHT middle lobe and RIGHT lower lobe.   Goals of care, counseling/discussion Discussed with spouse at length --discussed patient's overall poor prognosis in setting of multiple co-morbidities and his poor quality of life -discussed  his clinical worsening 6/19 despite optimal treatment -spouse expressed that she knows he's suffering with no quality of life -spouse stated that pt  told her "I'm ready to go home' -agrees to DNR -I had goals of care discussion with wife today and she agreed to full comfort measures and transfer to residential hospice care  Arm edema Venous duplex--neg In part due to third spacing with albumin <1.5   Hypernatremia - Na up to 158>>>137 -d/c D5W -start 1/2 NS>>improved   Acute cholecystitis -as mentioned, patient not a candidate for surgery - IR placed cholecystostomy tube 08/02/21 -Continue supportive care and IV antibiotics.   Thrombocytopenia (HCC) -due to sepsis -Platelets count nadir 70sK -improving with sepsis tx -restart heparin    Bacteremia due to group B Streptococcus 6/15 Repeat blood culture--GPC 1 of 2 sets Echo--EF 70-75%, no WMA, G1DD, no vegetation, trivial MR, mild TR Source - Gallbladder Repeat blood cultures--neg for GBS    Acute metabolic encephalopathy -Baseline>>pleasant confusion with hallucinations -Patient with underlying history of mood disorder/dementia and Parkinson. -Continue to be lethargic--but slowly improving -6/11 CT head--neg -ammonia = 41>>22 -6/11 ABG 7.45/32/77/22 on 2L -Palliative care consulted. -remains encephalopathic on 6/18, but more awake and occasionally follows commands -d/c hydromorphone>>>start norco 08/08/21--mental status took down turn again; awake and occasionally speaks ---VBG 7.07/15/69/20 ---[id number] ---6/19 UA no significant pyuria   HLD (hyperlipidemia) -initially held due to poor mentation and elevated LFTs -Holding statin for now.   Lactic acidosis -Secondary to sepsis -Initial lactic acid 6.3, repeat 4.6>> last one 1.1 -improved with IVF   Acute respiratory failure with hypoxia (HCC) -Patient with good saturation on 1-2 L. -Patient had increasing work of breathing to the point of requiring transient use of BiPAP at time of admission. -Continue current IV antibiotics -stable on RA   AKI (acute kidney injury) ([place]) -Creatinine normally in the  0.6-0.8 range at baseline; at time of admission 2.24 and peaked at 2.72; now down to 1.36.>>1.22>>1.02>>0.92 -due to volume depletion and sepsis/hemodynamic changes   Total bilirubin, elevated -history of biliary obstruction and ERCP back in 2018 -CT abd--bilateral inguinal hernias, herniated bladder on the right, small nonobstructing stone, and gallstones without complication -MRCP--no retained CBD stones or any masses in his liver. -LFTs has continued trending down overall. -Continue IV antibiotics -After discussing with general surgery>>check HIDA scan; which demonstrated positive result for acute cholecystitis and cystic duct obstruction. -not a candidate for cholecystectomy/surgical intervention. -6/13--cholecystotomy tube>>bili improved   Essential hypertension -Continue holding ARB and  HCTZ due to AKI -Blood pressure has now stabilized -change lopressor IV to po metoprolol>>d/c due to hypotension on 6/19 -BP and HR will be monitored closely in SDU -Pt was briefly on norepinephrine infusion 6/20 but now OFF norepi     Diabetes mellitus without complication -holding metformin -monitor glucose with CBGs and if it starts trending up will add insulin coverage. -so far CBG's are stable. -6/16 A1C--6.7   Parkinson disease -pt on Sinemet when able to take by mouth, but poor mentation precludes po intake initially -Patient with underlying mood disorder and mild hallucinations/psychosis as per patient's wife reports>>>worse over past 6-12 months at baseline -Unable to place NG tube on 07/30/21 (very traumatic and never able to passed from his neck). -now more awake -speech eval>>dys 1 with thin   DVT prophylaxis: sQ heparin Code Status: DNR  Family Communication: wife updated at length  Disposition: Status is: Inpatient Remains inpatient appropriate because: IV antibiotics required    Consultants:  Palliative Surgery  Procedures:    Antimicrobials:   Ceftriaxone  6/11>>6/19 Cefepime 6/8>>6/11 Vanc 6/19>> Merrem 6/19>> Discharge Diagnoses:  Principal Problem:   Severe sepsis ([place]) Active Problems:   Parkinson disease ([place])   Diabetes mellitus without complication ([place])   Essential hypertension   Total bilirubin, elevated   AKI (acute kidney injury) ([place])   Acute respiratory failure with hypoxia (HCC)   Lactic acidosis   HLD (hyperlipidemia)   Acute metabolic encephalopathy   Bacteremia due to group B Streptococcus   Thrombocytopenia (HCC)   Acute cholecystitis   Hypernatremia   Arm edema   Goals of care, counseling/discussion   Discharge Instructions:  Allergies as of 08/11/2021       Reactions   Aspirin Other (See Comments)   Stomach ulcers.   Coconut (cocos Nucifera)    Other reaction(s): GI Upset (intolerance)   Penicillins Rash   Has patient had a PCN reaction causing immediate rash, facial/tongue/throat swelling, SOB or lightheadedness with hypotension: no Has patient had a PCN reaction causing severe rash involving mucus membranes or skin necrosis: No Has patient had a PCN reaction that required hospitalization No Has patient had a PCN reaction occurring within the last 10 years: No If all of the above answers are "NO", then may proceed with Cephalosporin use.        Medication List     STOP taking these medications    aspirin 81 MG tablet   Azelastine HCl 0.15 % Soln   cyclobenzaprine 10 MG tablet Commonly known as: FLEXERIL   escitalopram 10 MG tablet Commonly known as: LEXAPRO   furosemide 20 MG tablet Commonly known as: LASIX   LORazepam 1 MG tablet Commonly known as: ATIVAN   meloxicam 15 MG tablet Commonly known as: MOBIC   metFORMIN 500 MG tablet Commonly known as: GLUCOPHAGE   metoprolol tartrate 25 MG tablet Commonly known as: LOPRESSOR   OLANZapine 10 MG tablet Commonly known as: ZyPREXA   pantoprazole 40 MG tablet Commonly known as: PROTONIX   potassium chloride SA 20 MEQ  tablet Commonly known as: KLOR-CON M   pramipexole 0.75 MG tablet Commonly known as: MIRAPEX   QUEtiapine 25 MG tablet Commonly known as: SEROQUEL   selegiline 5 MG tablet Commonly known as: ELDEPRYL   simvastatin 40 MG tablet Commonly known as: ZOCOR   valsartan-hydrochlorothiazide 80-12.5 MG tablet Commonly known as: DIOVAN-HCT       TAKE these medications    Carbidopa-Levodopa ER 25-100 MG tablet controlled release Commonly known as: SINEMET CR Take 1-2 tablets by mouth in the morning, at noon, in the evening, and at bedtime. 2 tabs in the am, 2 tabs at lunch, 1 tab at dinner, AND 2 tabs at bedtime        Allergies  Allergen Reactions   Aspirin Other (See Comments)    Stomach ulcers.   Coconut (Cocos Nucifera)     Other reaction(s): GI Upset (intolerance)   Penicillins Rash    Has patient had a PCN reaction causing immediate rash, facial/tongue/throat swelling, SOB or lightheadedness with hypotension: no Has patient had a PCN reaction causing severe rash involving mucus membranes or skin necrosis: No Has patient had a PCN reaction that required hospitalization No Has patient had a PCN reaction occurring within the last 10 years: No If all of the above answers are "NO", then may proceed with Cephalosporin use.    Allergies as of 08/11/2021       Reactions   Aspirin Other (See Comments)  Stomach ulcers.   Coconut (cocos Nucifera)    Other reaction(s): GI Upset (intolerance)   Penicillins Rash   Has patient had a PCN reaction causing immediate rash, facial/tongue/throat swelling, SOB or lightheadedness with hypotension: no Has patient had a PCN reaction causing severe rash involving mucus membranes or skin necrosis: No Has patient had a PCN reaction that required hospitalization No Has patient had a PCN reaction occurring within the last 10 years: No If all of the above answers are "NO", then may proceed with Cephalosporin use.        Medication List      STOP taking these medications    aspirin 81 MG tablet   Azelastine HCl 0.15 % Soln   cyclobenzaprine 10 MG tablet Commonly known as: FLEXERIL   escitalopram 10 MG tablet Commonly known as: LEXAPRO   furosemide 20 MG tablet Commonly known as: LASIX   LORazepam 1 MG tablet Commonly known as: ATIVAN   meloxicam 15 MG tablet Commonly known as: MOBIC   metFORMIN 500 MG tablet Commonly known as: GLUCOPHAGE   metoprolol tartrate 25 MG tablet Commonly known as: LOPRESSOR   OLANZapine 10 MG tablet Commonly known as: ZyPREXA   pantoprazole 40 MG tablet Commonly known as: PROTONIX   potassium chloride SA 20 MEQ tablet Commonly known as: KLOR-CON M   pramipexole 0.75 MG tablet Commonly known as: MIRAPEX   QUEtiapine 25 MG tablet Commonly known as: SEROQUEL   selegiline 5 MG tablet Commonly known as: ELDEPRYL   simvastatin 40 MG tablet Commonly known as: ZOCOR   valsartan-hydrochlorothiazide 80-12.5 MG tablet Commonly known as: DIOVAN-HCT       TAKE these medications    Carbidopa-Levodopa ER 25-100 MG tablet controlled release Commonly known as: SINEMET CR Take 1-2 tablets by mouth in the morning, at noon, in the evening, and at bedtime. 2 tabs in the am, 2 tabs at lunch, 1 tab at dinner, AND 2 tabs at bedtime        Procedures/Studies: CT ABDOMEN PELVIS W CONTRAST  Result Date: 08/09/2021 CLINICAL DATA:  Abdominal pain.  Dyspnea EXAM: CT ABDOMEN AND PELVIS WITH CONTRAST TECHNIQUE: Multidetector CT imaging of the abdomen and pelvis was performed using the standard protocol following bolus administration of intravenous contrast. RADIATION DOSE REDUCTION: This exam was performed according to the departmental dose-optimization program which includes automated exposure control, adjustment of the mA and/or kV according to patient size and/or use of iterative reconstruction technique. CONTRAST:  171m OMNIPAQUE IOHEXOL 300 MG/ML  SOLN COMPARISON:  07/28/2021, CT  abdomen FINDINGS: Lower chest: Interval new dense consolidation within the RIGHT lower lobe and RIGHT middle lobe. Small bilateral pleural effusions Hepatobiliary: Percutaneous cholecystostomy tube extends into the gallbladder lumen. Gallbladder decompressed. No biliary duct dilatation. Pancreas: Pancreas is normal. No ductal dilatation. No pancreatic inflammation. Spleen: Normal spleen Adrenals/urinary tract: Adrenal glands normal. Several low-density lesions of the LEFT kidney or favored benign cysts. Ureters normal. A corner of the bladder extends into a RIGHT inguinal hernia. Bladder herniation extends into the RIGHT hemiscrotum Stomach/Bowel: Stomach is normal. There is inflammation of the duodenum through the C-loop. No perforation. Small bowel normal. Colon normal. Large volume stool in the rectum. Vascular/Lymphatic: Abdominal aorta is normal caliber with atherosclerotic calcification. There is no retroperitoneal or periportal lymphadenopathy. No pelvic lymphadenopathy. Reproductive: Prostate unremarkable Other: No intraperitoneal free air or fluid. Musculoskeletal: Asymmetric thickening of the RIGHT operator muscle (image 70/3). Muscle thickening to 2.6 cm compared to 1 cm cm on the contralateral side. Findings  new from several days prior. IMPRESSION: 1. New dense consolidation of the RIGHT middle lobe and RIGHT lower lobe. Differential includes pneumonia versus mucous plugging. Consider CT thorax for further evaluation consider pulmonology consultation 2. Cholecystostomy tube in place without complicating features. 3. Inflammation of the second portion duodenum consistent with duodenitis. This may be secondary to gallbladder inflammation. 4. New thickening of the RIGHT operator muscle. Differential includes infection versus hematoma. No evidence trauma pelvis. Electronically Signed   By: Suzy Bouchard M.D.   On: 08/09/2021 18:20   DG CHEST PORT 1 VIEW  Result Date: 08/08/2021 CLINICAL DATA:   Shortness of breath. EXAM: PORTABLE CHEST 1 VIEW COMPARISON:  July 30, 2021. FINDINGS: The heart size and mediastinal contours are within normal limits. Left lung is clear. Minimal right basilar subsegmental atelectasis is noted. Right-sided PICC line is noted. The visualized skeletal structures are unremarkable. IMPRESSION: Minimal right basilar subsegmental atelectasis. Electronically Signed   By: Marijo Conception M.D.   On: 08/08/2021 15:20   US Venous Img Upper Bilat (DVT)  Result Date: 08/08/2021 CLINICAL DATA:  81 year old male with bilateral upper extremity edema EXAM: BILATERAL UPPER EXTREMITY VENOUS DOPPLER ULTRASOUND TECHNIQUE: Gray-scale sonography with graded compression, as well as color Doppler and duplex ultrasound were performed to evaluate the bilateral upper extremity deep venous systems from the level of the subclavian vein and including the jugular, axillary, basilic, radial, ulnar and upper cephalic vein. Spectral Doppler was utilized to evaluate flow at rest and with distal augmentation maneuvers. COMPARISON:  None Available. FINDINGS: RIGHT UPPER EXTREMITY Internal Jugular Vein: Unable to assess the right IJ. Subclavian Vein: Unable to assess Axillary Vein: No evidence of thrombus. Normal compressibility, respiratory phasicity and response to augmentation. Cephalic Vein: No evidence of thrombus. Normal compressibility, respiratory phasicity and response to augmentation. Basilic Vein: No evidence of thrombus. Normal compressibility, respiratory phasicity and response to augmentation. Brachial Veins: No evidence of thrombus. Normal compressibility, respiratory phasicity and response to augmentation. Radial Veins: No evidence of thrombus. Normal compressibility, respiratory phasicity and response to augmentation. Ulnar Veins: No evidence of thrombus. Normal compressibility, respiratory phasicity and response to augmentation. Other Findings:  Edema of the right forearm LEFT UPPER EXTREMITY  Internal Jugular Vein: No evidence of thrombus. Normal compressibility, respiratory phasicity and response to augmentation. Subclavian Vein: No evidence of thrombus. Normal compressibility, respiratory phasicity and response to augmentation. Axillary Vein: No evidence of thrombus. Normal compressibility, respiratory phasicity and response to augmentation. Cephalic Vein: No evidence of thrombus. Normal compressibility, respiratory phasicity and response to augmentation. Basilic Vein: No evidence of thrombus. Normal compressibility, respiratory phasicity and response to augmentation. Brachial Veins: No evidence of thrombus. Normal compressibility, respiratory phasicity and response to augmentation. Radial Veins: No evidence of thrombus. Normal compressibility, respiratory phasicity and response to augmentation. Ulnar Veins: No evidence of thrombus. Normal compressibility, respiratory phasicity and response to augmentation. Other Findings:  Edema IMPRESSION: Directed duplex of the bilateral upper extremity negative for DVT. Edema bilaterally Signed, Dulcy Fanny. Nadene Rubins, RPVI [facility] Electronically Signed   By: Corrie Mckusick D.O.   On: 08/08/2021 14:00   ECHOCARDIOGRAM COMPLETE  Result Date: 08/04/2021    ECHOCARDIOGRAM REPORT   Patient Name:   DELONTAE LAMM Date of Exam: 08/04/2021 Medical Rec #:  [id number]       Height:       70.0 in Accession #:    [id number]      Weight:       199.7  lb Date of Birth:  12-14-1940      BSA:          2.086 m Patient Age:    81 years        BP:           139/46 mmHg Patient Gender: M               HR:           81 bpm. Exam Location:  [facility] Procedure: 2D Echo, Cardiac Doppler and Color Doppler Indications:    Bacteremia  History:        Patient has no prior history of Echocardiogram examinations.                 Signs/Symptoms:Bacteremia; Risk Factors:Hypertension, Diabetes                 and Dyslipidemia.  Technically difficult study. Patient with                 Parkinson's disease is unable to follow instructions.  Sonographer:    Wenda Low Referring Phys: [phone] DAVID TAT  Sonographer Comments: Technically difficult study due to poor echo windows. Image acquisition challenging due to respiratory motion. IMPRESSIONS  1. Left ventricular ejection fraction, by estimation, is 70 to 75%. The left ventricle has hyperdynamic function. The left ventricle has no regional wall motion abnormalities. Left ventricular diastolic parameters are consistent with Grade I diastolic dysfunction (impaired relaxation).  2. Right ventricular systolic function is normal. The right ventricular size is normal. There is normal pulmonary artery systolic pressure. The estimated right ventricular systolic pressure is 73.4 mmHg.  3. The mitral valve is grossly normal. Trivial mitral valve regurgitation.  4. The aortic valve is calcified. Aortic valve regurgitation is not visualized. Aortic valve sclerosis/calcification is present, without any evidence of aortic stenosis. Aortic valve mean gradient measures 6.0 mmHg.  5. The inferior vena cava is normal in size with greater than 50% respiratory variability, suggesting right atrial pressure of 3 mmHg. Comparison(s): No prior Echocardiogram. Conclusion(s)/Recommendation(s): No evidence of valvular vegetations on this transthoracic echocardiogram. Consider a transesophageal echocardiogram to exclude infective endocarditis if clinically indicated. FINDINGS  Left Ventricle: Left ventricular ejection fraction, by estimation, is 70 to 75%. The left ventricle has hyperdynamic function. The left ventricle has no regional wall motion abnormalities. The left ventricular internal cavity size was normal in size. There is no left ventricular hypertrophy. Left ventricular diastolic parameters are consistent with Grade I diastolic dysfunction (impaired relaxation). Indeterminate filling pressures. Right  Ventricle: The right ventricular size is normal. No increase in right ventricular wall thickness. Right ventricular systolic function is normal. There is normal pulmonary artery systolic pressure. The tricuspid regurgitant velocity is 2.66 m/s, and  with an assumed right atrial pressure of 3 mmHg, the estimated right ventricular systolic pressure is 19.3 mmHg. Left Atrium: Left atrial size was normal in size. Right Atrium: Right atrial size was normal in size. Pericardium: There is no evidence of pericardial effusion. Mitral Valve: The mitral valve is grossly normal. Trivial mitral valve regurgitation. MV peak gradient, 3.1 mmHg. The mean mitral valve gradient is 1.0 mmHg. Tricuspid Valve: The tricuspid valve is grossly normal. Tricuspid valve regurgitation is mild. Aortic Valve: The aortic valve is calcified. Aortic valve regurgitation is not visualized. Aortic valve sclerosis/calcification is present, without any evidence of aortic stenosis. Aortic valve mean gradient measures 6.0 mmHg. Aortic valve peak gradient measures 10.8 mmHg. Aortic valve area, by VTI measures 2.18  cm. Pulmonic Valve: The pulmonic valve was normal in structure. Pulmonic valve regurgitation is not visualized. Aorta: The aortic root and ascending aorta are structurally normal, with no evidence of dilitation. Venous: The inferior vena cava is normal in size with greater than 50% respiratory variability, suggesting right atrial pressure of 3 mmHg. IAS/Shunts: No atrial level shunt detected by color flow Doppler.  LEFT VENTRICLE PLAX 2D LVIDd:         4.90 cm   Diastology LVIDs:         2.70 cm   LV e' medial:    6.42 cm/s LV PW:         1.10 cm   LV E/e' medial:  10.7 LV IVS:        1.00 cm   LV e' lateral:   12.20 cm/s LVOT diam:     1.90 cm   LV E/e' lateral: 5.7 LV SV:         71 LV SV Index:   34 LVOT Area:     2.84 cm  LEFT ATRIUM           Index LA diam:      3.50 cm 1.68 cm/m LA Vol (A4C): 22.8 ml 10.93 ml/m  AORTIC VALVE AV Area  (Vmax):    1.83 cm AV Area (Vmean):   1.73 cm AV Area (VTI):     2.18 cm AV Vmax:           164.00 cm/s AV Vmean:          119.000 cm/s AV VTI:            0.325 m AV Peak Grad:      10.8 mmHg AV Mean Grad:      6.0 mmHg LVOT Vmax:         106.00 cm/s LVOT Vmean:        72.700 cm/s LVOT VTI:          0.250 m LVOT/AV VTI ratio: 0.77  AORTA Ao Root diam: 3.40 cm MITRAL VALVE               TRICUSPID VALVE MV Area (PHT): 2.85 cm    TR Peak grad:   28.3 mmHg MV Area VTI:   3.35 cm    TR Vmax:        266.00 cm/s MV Peak grad:  3.1 mmHg MV Mean grad:  1.0 mmHg    SHUNTS MV Vmax:       0.88 m/s    Systemic VTI:  0.25 m MV Vmean:      48.2 cm/s   Systemic Diam: 1.90 cm MV Decel Time: 266 msec MV E velocity: 69.00 cm/s MV A velocity: 56.10 cm/s MV E/A ratio:  1.23 Lyman Bishop MD Electronically signed by Lyman Bishop MD Signature Date/Time: 08/04/2021/4:53:01 PM    Final    [place] EKG [facility]  Result Date: 08/03/2021 If [facility] image not attached, placement could not be confirmed due to current cardiac rhythm.  IR Perc Cholecystostomy  Result Date: 08/02/2021 INDICATION: 81 year old male referred for percutaneous cholecystostomy EXAM: CHOLECYSTOSTOMY MEDICATIONS: 500 mg levofloxacin; The antibiotic was administered within an appropriate time frame prior to the initiation of the procedure. ANESTHESIA/SEDATION: Moderate (conscious) sedation was employed during this procedure. A total of Versed 0.5 mg and Fentanyl 25 mcg was administered intravenously. Moderate Sedation Time: 11 minutes. The patient's level of consciousness and vital signs were monitored continuously by radiology nursing throughout the procedure under my  direct supervision. FLUOROSCOPY TIME:  Fluoroscopy Time: 0 minutes 24 seconds (3 mGy). COMPLICATIONS: None PROCEDURE: Informed written consent was obtained from the patient and the patient's family after a thorough discussion of the procedural risks, benefits and alternatives. All questions were  addressed. Maximal Sterile Barrier Technique was utilized including caps, mask, sterile gowns, sterile gloves, sterile drape, hand hygiene and skin antiseptic. A timeout was performed prior to the initiation of the procedure. Ultrasound survey of the right upper quadrant was performed for planning purposes. Once the patient is prepped and draped in the usual sterile fashion, the skin and subcutaneous tissues overlying the gallbladder were generously infiltrated 1% lidocaine for local anesthesia. A coaxial needle was advanced under ultrasound guidance through the skin subcutaneous tissues and a small segment of liver into the gallbladder lumen. With removal of the stylet, spontaneous dark bile drainage occurred. Using modified Seldinger technique, a 10 French drain was placed into the gallbladder fossa, with aspiration of the sample for the lab. Contrast injection confirmed position of the tube within the gallbladder lumen. Drainage catheter was attached to gravity drain with a suture retention placed. Patient tolerated the procedure well and remained hemodynamically stable throughout. No complications were encountered and no significant blood loss encountered. IMPRESSION: Status post percutaneous cholecystostomy Signed, Dulcy Fanny. Nadene Rubins, RPVI [facility] Radiology Electronically Signed   By: Corrie Mckusick D.O.   On: 08/02/2021 14:33   NM Hepatobiliary Liver Func  Result Date: 08/01/2021 CLINICAL DATA:  Concern for cholecystitis. EXAM: NUCLEAR MEDICINE HEPATOBILIARY IMAGING TECHNIQUE: Sequential images of the abdomen were obtained out to 60 minutes following intravenous administration of radiopharmaceutical. 3 mm IV morphine administered to augment filling of the gallbladder. RADIOPHARMACEUTICALS:  5.5 mCi Tc-34m Choletec IV COMPARISON:  MRI 07/29/2021 FINDINGS: Uniform accumulation radiotracer within the liver. Counts are evident within the small bowel  by 20 minutes. The gallbladder fails to fill at 60 minutes. IV morphine was administered to augment filling of the gallbladder. Post morphine injection, the gallbladder fails to fill. IMPRESSION: 1. Non filling of the gallbladder is consistent with obstruction of cystic duct / acute cholecystitis. 2. Patent common bile duct. These results will be called to the ordering clinician or representative by the Radiologist Assistant, and communication documented in the PACS or [employer] Electronically Signed   By: SSuzy BouchardM.D.   On: 08/01/2021 11:52   CT HEAD WO CONTRAST (5MM)  Result Date: 07/31/2021 CLINICAL DATA:  Altered mental status EXAM: CT HEAD WITHOUT CONTRAST TECHNIQUE: Contiguous axial images were obtained from the base of the skull through the vertex without intravenous contrast. RADIATION DOSE REDUCTION: This exam was performed according to the departmental dose-optimization program which includes automated exposure control, adjustment of the mA and/or kV according to patient size and/or use of iterative reconstruction technique. COMPARISON:  03/19/2021 FINDINGS: Brain: No acute intracranial findings are seen. There are no signs of bleeding within the cranium. Cortical sulci are prominent more so in the left cerebral hemisphere. There is no focal effacement of cortical sulci. Vascular: Unremarkable. Skull: Unremarkable. Sinuses/Orbits: There is mucosal thickening in the ethmoid sinus. Other: None. IMPRESSION: No acute intracranial findings are seen in noncontrast CT brain. Atrophy. Electronically Signed   By: PElmer PickerM.D.   On: 07/31/2021 14:40   DG Chest Port 1 View  Addendum Date: 07/30/2021   ADDENDUM REPORT: 07/30/2021 14:27 ADDENDUM: Voice recognition error in the impression. For the first sentence, the NG tube should be described as  malpositioned, not well positioned. NG tube curls in the neck and does not extend into the thorax and will need to be  replaced/repositioned. Electronically Signed   By: Lajean Manes M.D.   On: 07/30/2021 14:27   Result Date: 07/30/2021 CLINICAL DATA:  NG tube placement. EXAM: PORTABLE CHEST 1 VIEW COMPARISON:  07/29/2021. FINDINGS: NG tube is partly visualized, curled in the neck, presumably in the oropharynx. Cardiac silhouette normal in size. No mediastinal or hilar masses. Lungs demonstrate prominent bronchovascular markings, otherwise clear. No pleural effusion or pneumothorax. IMPRESSION: 1. NG tube well positioned, curled in the neck, presumably in the oropharynx. 2. No acute cardiopulmonary disease. Electronically Signed: By: Lajean Manes M.D. On: 07/30/2021 13:46   DG Abd 1 View  Result Date: 07/30/2021 CLINICAL DATA:  Gastric distension EXAM: ABDOMEN - 1 VIEW COMPARISON:  None Available. FINDINGS: The bowel gas pattern is nonobstructive. Mild gaseous distension of stomach and bowel. There are no abnormal calcifications overlying the kidneys. Pelvic phleboliths. Chronic right anterolateral tenth eleventh rib injuries. Degenerative changes of the spine with levoconvex lumbar curvature. IMPRESSION: No evidence of bowel obstruction. Electronically Signed   By: Maurine Simmering M.D.   On: 07/30/2021 11:33   MR ABDOMEN MRCP WO CONTRAST  Result Date: 07/29/2021 CLINICAL DATA:  Upper abdominal pain. Dilated common bile duct on ultrasound. EXAM: MRI ABDOMEN WITHOUT CONTRAST  (INCLUDING MRCP) TECHNIQUE: Multiplanar multisequence MR imaging of the abdomen was performed. Heavily T2-weighted images of the biliary and pancreatic ducts were obtained, and three-dimensional MRCP images were rendered by post processing. COMPARISON:  Ultrasound 07/29/2021 FINDINGS: Motion degradation exam. ICU patient. ICU patients have difficulty following breath holding commands. Lower chest:  Small bilateral pleural effusions Hepatobiliary: No intrahepatic biliary duct dilatation. Common bile duct normal caliber. The no gallstones identified. Small  amount sludge within the gallbladder. No gallbladder distension or inflammation. Pancreas: Normal pancreatic parenchymal intensity. No ductal dilatation or inflammation. Spleen: Normal spleen. Adrenals/urinary tract: Adrenal glands and kidneys are normal. Stomach/Bowel: Stomach and limited of the small bowel is unremarkable Vascular/Lymphatic: Abdominal aortic normal caliber. No retroperitoneal periportal lymphadenopathy. Musculoskeletal: No aggressive osseous lesion IMPRESSION: 1. Normal common bile duct. No intrahepatic biliary duct dilatation. 2. Small amount gallbladder sludge.  No evidence of cholecystitis. 3. Normal pancreas. 4. Bibasilar atelectasis. 5. Imaging degraded by respiratory motion. Electronically Signed   By: Suzy Bouchard M.D.   On: 07/29/2021 19:59   MR 3D Recon At Scanner  Result Date: 07/29/2021 CLINICAL DATA:  Upper abdominal pain. Dilated common bile duct on ultrasound. EXAM: MRI ABDOMEN WITHOUT CONTRAST  (INCLUDING MRCP) TECHNIQUE: Multiplanar multisequence MR imaging of the abdomen was performed. Heavily T2-weighted images of the biliary and pancreatic ducts were obtained, and three-dimensional MRCP images were rendered by post processing. COMPARISON:  Ultrasound 07/29/2021 FINDINGS: Motion degradation exam. ICU patient. ICU patients have difficulty following breath holding commands. Lower chest:  Small bilateral pleural effusions Hepatobiliary: No intrahepatic biliary duct dilatation. Common bile duct normal caliber. The no gallstones identified. Small amount sludge within the gallbladder. No gallbladder distension or inflammation. Pancreas: Normal pancreatic parenchymal intensity. No ductal dilatation or inflammation. Spleen: Normal spleen. Adrenals/urinary tract: Adrenal glands and kidneys are normal. Stomach/Bowel: Stomach and limited of the small bowel is unremarkable Vascular/Lymphatic: Abdominal aortic normal caliber. No retroperitoneal periportal lymphadenopathy.  Musculoskeletal: No aggressive osseous lesion IMPRESSION: 1. Normal common bile duct. No intrahepatic biliary duct dilatation. 2. Small amount gallbladder sludge.  No evidence of cholecystitis. 3. Normal pancreas. 4. Bibasilar atelectasis. 5. Imaging degraded by  respiratory motion. Electronically Signed   By: Suzy Bouchard M.D.   On: 07/29/2021 19:59   DG CHEST PORT 1 VIEW  Result Date: 07/29/2021 CLINICAL DATA:  Shortness of breath.  Chest pain.  Diaphoresis. EXAM: PORTABLE CHEST 1 VIEW COMPARISON:  07/29/2021 FINDINGS: Low lung volumes are present, causing crowding of the pulmonary vasculature. There is likely mild subsegmental atelectasis along both hemidiaphragms. Mild cardiomegaly. Prominence of the right upper mediastinal margin, cannot exclude adenopathy or nodule. IMPRESSION: 1. Low lung volumes and suspected mild atelectasis at the lung bases. 2. Somewhat nodular prominence the right upper mediastinum, cannot exclude adenopathy or nodule although this might possibly be from tortuous vasculature. Chest CT could be utilized for further characterization. 3. Mild cardiomegaly. Electronically Signed   By: Van Clines M.D.   On: 07/29/2021 10:05   US Abdomen Limited RUQ (LIVER/GB)  Result Date: 07/29/2021 CLINICAL DATA:  Abnormal LFTs EXAM: ULTRASOUND ABDOMEN LIMITED RIGHT UPPER QUADRANT COMPARISON:  CT abdomen and pelvis 07/28/2021 FINDINGS: Gallbladder: Moderately distended and contains echogenic calculi and sludge dependently. 5 mm echogenic likely polyp at the fundus. No significant wall thickening or pericholecystic fluid identified. Common bile duct: Diameter: 8 mm, upper normal for the patient's age. Liver: No focal lesion identified. Within normal limits in parenchymal echogenicity. Portal vein is patent on color Doppler imaging with normal direction of blood flow towards the liver. Other: None. IMPRESSION: 1. Cholelithiasis and sludge. 2. 5 mm likely polyp in the gallbladder. 3. Upper  normal caliber of the common bile duct and moderate distention of the gallbladder. Correlate clinically and consider MRCP if indicated. Electronically Signed   By: Ofilia Neas M.D.   On: 07/29/2021 09:56   DG CHEST PORT 1 VIEW  Result Date: 07/29/2021 CLINICAL DATA:  Wheezing EXAM: PORTABLE CHEST 1 VIEW COMPARISON:  07/28/2021 FINDINGS: Limited low volume chest with generalized interstitial coarsening/crowding. No Kerley lines or pleural effusion. Normal heart size and mediastinal contours. Extensive artifact from EKG leads IMPRESSION: Limited low volume chest that is stable from yesterday Electronically Signed   By: Jorje Guild M.D.   On: 07/29/2021 06:48   DG Wrist Complete Left  Result Date: 07/29/2021 CLINICAL DATA:  Wrist pain EXAM: LEFT WRIST - COMPLETE 3+ VIEW COMPARISON:  None Available. FINDINGS: No fracture or malalignment. Vascular calcifications. Advanced joint space narrowing of the radiocarpal joint. No erosions. IMPRESSION: 1. 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 2. No acute osseous abnormality Electronically Signed   By: Donavan Foil M.D.   On: 07/29/2021 00:09   CT ABDOMEN PELVIS WO CONTRAST  Result Date: 07/28/2021 CLINICAL DATA:  Abdominal pain EXAM: CT ABDOMEN AND PELVIS WITHOUT CONTRAST TECHNIQUE: Multidetector CT imaging of the abdomen and pelvis was performed following the standard protocol without IV contrast. RADIATION DOSE REDUCTION: This exam was performed according to the departmental dose-optimization program which includes automated exposure control, adjustment of the mA and/or kV according to patient size and/or use of iterative reconstruction technique. COMPARISON:  07/26/2021 FINDINGS: Lower chest: Small pleural effusions are noted bilaterally slightly increased when compared with the prior exam. Mild bibasilar atelectasis is seen. Hepatobiliary: Liver is within normal limits. Dependent gallstones are noted within the gallbladder which is well distended.  Pneumobilia is again identified consistent with the given clinical history of sphincterotomy. Pancreas: Unremarkable. No pancreatic ductal dilatation or surrounding inflammatory changes. Spleen: Normal in size without focal abnormality. Adrenals/Urinary Tract: Adrenal glands are within normal limits bilaterally. Kidneys show no tiny nonobstructing renal stone on the right. No definitive stones  are noted on the left. Hypodense lesion is noted within the left kidney measuring approximately 1.5 cm consistent with small cysts stable in appearance from the prior exam. No obstructive changes are seen. The bladder is well distended with mildly opacified urine. A portion of the bladder extends into a large right sided inguinal hernia. This is stable in appearance from the prior exam. Stomach/Bowel: Scattered fecal material is noted throughout the colon. Mild diverticular change is seen. The appendix has been surgically removed consistent with the given clinical history. Small bowel and stomach are unremarkable. Vascular/Lymphatic: Aortic atherosclerosis. No enlarged abdominal or pelvic lymph nodes. Reproductive: Prostate is unremarkable. Other: No ascites is noted. Bilateral fat containing inguinal hernias are seen. Persistent herniated bladder into the right hernia is noted. Musculoskeletal: Degenerative changes of lumbar spine are seen. IMPRESSION: Small nonobstructing right renal stone. Bilateral inguinal hernias with herniated bladder on the right stable from the prior study. Multiple small gallstones without complicating factors. No other focal abnormality is noted. Electronically Signed   By: Inez Catalina M.D.   On: 07/28/2021 23:58   DG Chest Port 1 View  Result Date: 07/28/2021 CLINICAL DATA:  Respiratory distress EXAM: PORTABLE CHEST 1 VIEW COMPARISON:  07/26/2021 FINDINGS: Hypoventilatory changes. No consolidation or effusion. Stable cardiomediastinal silhouette. No pneumothorax IMPRESSION: No active disease.   Low lung volumes. Electronically Signed   By: Donavan Foil M.D.   On: 07/28/2021 22:42   DG Chest Port 1 View  Result Date: 07/26/2021 CLINICAL DATA:  leukocytosis, back pain, poss PNA? EXAM: PORTABLE CHEST 1 VIEW COMPARISON:  Radiograph 08/01/2018 FINDINGS: Unchanged cardiomediastinal silhouette. Low lung volumes. No focal airspace disease. No pleural effusion. No pneumothorax. 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 IMPRESSION: Low lung volumes.  No focal airspace disease. High-riding humeral heads bilaterally can be seen in distal rotator cuff tendinopathy. Electronically Signed   By: Maurine Simmering M.D.   On: 07/26/2021 14:09   CT Abdomen Pelvis W Contrast  Result Date: 07/26/2021 CLINICAL DATA:  Abdominal pain, acute, nonlocalized dementia, prior abd surgery, back? pain, diaphoresis, concern for aorta vs. nephrolith EXAM: CT ABDOMEN AND PELVIS WITH CONTRAST TECHNIQUE: Multidetector CT imaging of the abdomen and pelvis was performed using the standard protocol following bolus administration of intravenous contrast. RADIATION DOSE REDUCTION: This exam was performed according to the departmental dose-optimization program which includes automated exposure control, adjustment of the mA and/or kV according to patient size and/or use of iterative reconstruction technique. CONTRAST:  20m OMNIPAQUE IOHEXOL 300 MG/ML  SOLN COMPARISON:  CT 12/23/2015 FINDINGS: Lower chest: No acute abnormality. Hepatobiliary: No focal liver abnormality is seen. Mild gallbladder distension with layering sludge or tiny stones. There is pneumobilia consistent with history of prior ERCP and sphincterotomy. Pancreas: Unremarkable. No pancreatic ductal dilatation or surrounding inflammatory changes. Spleen: Normal in size without focal abnormality. Adrenals/Urinary Tract: Adrenal glands are unremarkable. No hydronephrosis or nephrolithiasis. There are nonobstructive bilateral renal stones. Unchanged  bilateral renal cysts. There is a right inguinal hernia containing herniated bladder. Stomach/Bowel: The stomach is within normal limits. There is no evidence of bowel obstruction.Prior appendectomy. Scattered colonic diverticula. No diverticulitis. Moderate rectal stool burden. Vascular/Lymphatic: Aortoiliac atherosclerosis. No AAA. No lymphadenopathy. Reproductive: Unremarkable. Other: Right inguinal hernia containing hernia bladder. Fat containing left inguinal hernia. Unchanged anterior abdominal wall scarring. Musculoskeletal: Levoconvex upper lumbar and dextroconvex lower lumbar curvatures. 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 There is severe multilevel facet arthropathy. Mild bilateral hip osteoarthritis. IMPRESSION: Nonobstructive  3 mm renal stones bilaterally. No hydronephrosis or ureterolithiasis. Right inguinal hernia containing distended herniated bladder. Mild gallbladder is tension with layering sludge or small stones. No other findings to suggest cholecystitis by CT. 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 Aortoiliac atherosclerosis.  No AAA. Moderate rectal stool burden. Electronically Signed   By: Maurine Simmering M.D.   On: 07/26/2021 10:10     Subjective: Pt remains encephalopathic but arousable with no acute complaints other than back pain.    Discharge Exam: Vitals:   08/11/21 1125 08/11/21 1130  BP: (!) 132/46   Pulse: 90 88  Resp: (!) 35 (!) 38  Temp: 99.3 F (37.4 C)   SpO2: 98% 97%   Vitals:   08/11/21 1000 08/11/21 1100 08/11/21 1125 08/11/21 1130  BP: (!) 129/43  (!) 132/46   Pulse: 93 86 90 88  Resp: (!) 46 (!) 38 (!) 35 (!) 38  Temp:   99.3 F (37.4 C)   TempSrc:   Oral   SpO2: 95% 96% 98% 97%  Weight:      Height:       General: Pt appears terminally ill.   Cardiovascular: normal  S1/S2 +, no rubs, no  gallops Respiratory: CTA bilaterally, no wheezing, no rhonchi Abdominal: Soft, NT, ND, bowel sounds + Extremities: no edema, no cyanosis   The results of significant diagnostics from this hospitalization (including imaging, microbiology, ancillary and laboratory) are listed below for reference.     Microbiology: Recent Results (from the past 240 hour(s))  Aerobic/Anaerobic Culture w Gram Stain (surgical/deep wound)     Status: Abnormal   Collection Time: 08/02/21  2:25 PM   Specimen: Gallbladder; Bile  Result Value Ref Range Status   Specimen Description   Final    GALL BLADDER Performed at [facility], [address], [place], [place] [address]    Special Requests   Final    NONE Performed at [facility], [address], [place], [place] [address]    Gram Stain   Final    NO WBC SEEN FEW GRAM POSITIVE COCCI IN PAIRS FEW GRAM NEGATIVE RODS MODERATE BRANCHING, BEADING  GRAM POSITIVE RODS    Culture (A)  Final    MULTIPLE ORGANISMS PRESENT, NONE PREDOMINANT NO GROUP A STREP (S.PYOGENES) ISOLATED NO STAPHYLOCOCCUS AUREUS ISOLATED NO ANAEROBES ISOLATED Performed at [facility], [place] [address], [place], [place] [address]    Report Status 08/07/2021 FINAL  Final  Culture, blood (Routine X 2) w Reflex to ID Panel     Status: Abnormal   Collection Time: 08/04/21  4:06 AM   Specimen: Right Antecubital; Blood  Result Value Ref Range Status   Specimen Description   Final    RIGHT ANTECUBITAL Performed at [facility], [address], [place], [place] [address]    Special Requests   Final    BOTTLES DRAWN AEROBIC AND ANAEROBIC Blood Culture adequate volume Performed at [facility], [address] [place], [place] [address]    Culture  Setup Time   Final    GRAM POSITIVE COCCI ANAEROBIC BOTTLE ONLY Gram Stain Report Called to,Read Back By and Verified With: MCGIBBONY @ [id number] ON [id number] BY HENDERSON L CRITICAL RESULT CALLED TO, READ BACK BY AND VERIFIED WITH: RN Ralph Dowdy  [phone] '@1926'$  FH    Culture (A)  Final    STAPHYLOCOCCUS EPIDERMIDIS THE SIGNIFICANCE OF ISOLATING THIS ORGANISM FROM A SINGLE SET OF  BLOOD CULTURES WHEN MULTIPLE SETS ARE DRAWN IS UNCERTAIN. PLEASE NOTIFY THE MICROBIOLOGY DEPARTMENT WITHIN ONE WEEK IF SPECIATION AND SENSITIVITIES ARE REQUIRED. Performed at [facility], [place] [address], [place], [place] [address]    Report Status 08/07/2021 FINAL  Final  Blood Culture ID Panel (Reflexed)     Status: Abnormal   Collection Time: 08/04/21  4:06 AM  Result Value Ref Range Status   Enterococcus faecalis NOT DETECTED NOT DETECTED Final   Enterococcus Faecium NOT DETECTED NOT DETECTED Final   Listeria monocytogenes NOT DETECTED NOT DETECTED Final   Staphylococcus species DETECTED (A) NOT DETECTED Final    Comment: CRITICAL RESULT CALLED TO, READ BACK BY AND VERIFIED WITH: RN Ralph Dowdy [phone] '@1926'$  FH    Staphylococcus aureus (BCID) NOT DETECTED NOT DETECTED Final   Staphylococcus epidermidis DETECTED (A) NOT DETECTED Final    Comment: Methicillin (oxacillin) resistant coagulase negative staphylococcus. Possible blood culture contaminant (unless isolated from more than one blood culture draw or clinical case suggests pathogenicity). No antibiotic treatment is indicated for blood  culture contaminants. CRITICAL RESULT CALLED TO, READ BACK BY AND VERIFIED WITH: RN Ralph Dowdy [phone] '@1926'$  FH    Staphylococcus lugdunensis NOT DETECTED NOT DETECTED Final   Streptococcus species NOT DETECTED NOT DETECTED Final   Streptococcus agalactiae NOT DETECTED NOT DETECTED Final   Streptococcus pneumoniae NOT DETECTED NOT DETECTED Final   Streptococcus pyogenes NOT DETECTED NOT DETECTED Final   A.calcoaceticus-baumannii NOT DETECTED NOT DETECTED Final   Bacteroides fragilis NOT DETECTED NOT DETECTED Final   Enterobacterales NOT DETECTED NOT DETECTED Final   Enterobacter cloacae complex NOT DETECTED NOT DETECTED Final   Escherichia coli NOT DETECTED  NOT DETECTED Final   Klebsiella aerogenes NOT DETECTED NOT DETECTED Final   Klebsiella oxytoca NOT DETECTED NOT DETECTED Final   Klebsiella pneumoniae NOT DETECTED NOT DETECTED Final   Proteus species NOT DETECTED NOT DETECTED Final   Salmonella species NOT DETECTED NOT DETECTED Final   Serratia marcescens NOT DETECTED NOT DETECTED Final   Haemophilus influenzae NOT DETECTED NOT DETECTED Final   Neisseria meningitidis NOT DETECTED NOT DETECTED Final   Pseudomonas aeruginosa NOT DETECTED NOT DETECTED Final   Stenotrophomonas maltophilia NOT DETECTED NOT DETECTED Final   Candida albicans NOT DETECTED NOT DETECTED Final   Candida auris NOT DETECTED NOT DETECTED Final   Candida glabrata NOT DETECTED NOT DETECTED Final   Candida krusei NOT DETECTED NOT DETECTED Final   Candida parapsilosis NOT DETECTED NOT DETECTED Final   Candida tropicalis NOT DETECTED NOT DETECTED Final   Cryptococcus neoformans/gattii NOT DETECTED NOT DETECTED Final   Methicillin resistance mecA/C DETECTED (A) NOT DETECTED Final    Comment: CRITICAL RESULT CALLED TO, READ BACK BY AND VERIFIED WITH: RN Ralph Dowdy [phone] '@1926'$  FH Performed at [facility], [address], [place], [place] [address]   Culture, blood (Routine X 2) w Reflex to ID Panel     Status: None   Collection Time: 08/04/21  6:15 AM   Specimen: BLOOD LEFT HAND  Result Value Ref Range Status   Specimen Description BLOOD LEFT HAND  Final   Special Requests   Final    BOTTLES DRAWN AEROBIC AND ANAEROBIC Blood Culture adequate volume   Culture   Final    NO GROWTH 5 DAYS Performed at [facility], [address] [place], [place] [address]    Report Status 08/09/2021 FINAL  Final  Culture, blood (Routine X 2) w Reflex to ID Panel  Status: Abnormal   Collection Time: 08/08/21  3:49 PM   Specimen: BLOOD RIGHT HAND  Result Value Ref Range Status   Specimen Description   Final    BLOOD RIGHT HAND Performed at [facility], [address], [place], [place] [address]    Special Requests   Final    BOTTLES DRAWN AEROBIC AND ANAEROBIC Blood Culture results may not be optimal due to an inadequate volume of blood received in culture bottles Performed at [facility], [address], [place], [place] [address]    Culture  Setup Time   Final    ANAEROBIC BOTTLE ONLY GRAM POSITIVE COCCI Gram Stain Report Called to,Read Back By and Verified With: BRITTANY FOLEY @ 2119 ON 08/09/2021 C VARNER IN BOTH AEROBIC AND ANAEROBIC BOTTLES CRITICAL VALUE NOTED.  VALUE IS CONSISTENT WITH PREVIOUSLY REPORTED AND CALLED VALUE. GRAM STAIN REVIEWED-AGREE WITH RESULT Performed at [facility], [place] [address] [place], [place] [address]    Culture STAPHYLOCOCCUS EPIDERMIDIS (A)  Final   Report Status 08/11/2021 FINAL  Final   Organism ID, Bacteria STAPHYLOCOCCUS EPIDERMIDIS  Final      Susceptibility   Staphylococcus epidermidis - MIC*    CIPROFLOXACIN <=0.5 SENSITIVE Sensitive     ERYTHROMYCIN <=0.25 SENSITIVE Sensitive     GENTAMICIN <=0.5 SENSITIVE Sensitive     OXACILLIN >=4 RESISTANT Resistant     TETRACYCLINE <=1 SENSITIVE Sensitive     VANCOMYCIN 1 SENSITIVE Sensitive     TRIMETH/SULFA <=10 SENSITIVE Sensitive     CLINDAMYCIN <=0.25 SENSITIVE Sensitive     RIFAMPIN <=0.5 SENSITIVE Sensitive     Inducible Clindamycin NEGATIVE Sensitive     * STAPHYLOCOCCUS EPIDERMIDIS  Culture, blood (Routine X 2) w Reflex to ID Panel     Status: None (Preliminary result)   Collection Time: 08/08/21  3:49 PM   Specimen: BLOOD  Result Value Ref Range Status   Specimen Description BLOOD PICC LINE  Final   Special Requests   Final    BOTTLES DRAWN AEROBIC AND ANAEROBIC Blood Culture adequate volume   Culture   Final    NO GROWTH 3 DAYS Performed at [facility], [address], [place], [place] [address]    Report Status PENDING  Incomplete  Urine Culture     Status: Abnormal   Collection Time: 08/08/21  5:07 PM   Specimen: Urine, Clean Catch   Result Value Ref Range Status   Specimen Description   Final    URINE, CLEAN CATCH Performed at [facility], [address], [place], [place] [address]    Special Requests   Final    NONE Performed at [facility], [address], [place],  [address]    Culture (A)  Final    <10,000 COLONIES/mL INSIGNIFICANT GROWTH Performed at [facility], [place] [address], [place],  [address]    Report Status 08/09/2021 FINAL  Final  SARS Coronavirus 2 by RT PCR (hospital order, performed in [facility] hospital lab) *cepheid single result test* Anterior Nasal Swab     Status: None   Collection Time: 08/11/21 11:02 AM   Specimen: Anterior Nasal Swab  Result Value Ref Range Status   SARS Coronavirus 2 by RT PCR NEGATIVE NEGATIVE Final    Comment: (NOTE) SARS-CoV-2 target nucleic acids are NOT DETECTED.  The SARS-CoV-2 RNA is generally detectable in upper and lower respiratory specimens during the acute phase of infection. The lowest concentration of SARS-CoV-2 viral copies this assay can detect  is 250 copies / mL. A negative result does not preclude SARS-CoV-2 infection and should not be used as the sole basis for treatment or other patient management decisions.  A negative result may occur with improper specimen collection / handling, submission of specimen other than nasopharyngeal swab, presence of viral mutation(s) within the areas targeted by this assay, and inadequate number of viral copies (<250 copies / mL). A negative result must be combined with clinical observations, patient history, and epidemiological information.  Fact Sheet for Patients:   https://www.patel.info/  Fact Sheet for Healthcare Providers: https://hall.com/  This test is not yet approved or  cleared by the [place] FDA and has been authorized for detection and/or diagnosis of SARS-CoV-2 by FDA under an Emergency Use Authorization (EUA).  This EUA  will remain in effect (meaning this test can be used) for the duration of the COVID-19 declaration under Section 564(b)(1) of the Act, 21 U.S.C. section 360bbb-3(b)(1), unless the authorization is terminated or revoked sooner.  Performed at [facility], [address] [place], [place] [address]      Labs: BNP (last 3 results) No results for input(s): "BNP" in the last 8760 hours. Basic Metabolic Panel: Recent Labs  Lab 08/05/21 0327 08/06/21 0353 08/07/21 0443 08/07/21 1426 08/08/21 0339 08/08/21 1354 08/09/21 0404 08/10/21 0322 08/11/21 0357  NA 154*   < > 137 132*  --  135 136 139 137  K 4.1   < > 4.1 4.3  --  4.4 4.0 4.3 4.2  CL 128*   < > 112* 110  --  112* 114* 118* 116*  CO2 23   < > 21* 18*  --  20* 20* 20* 21*  GLUCOSE 203*   < > 237* 233*  --  141* 127* 122* 154*  BUN 52*   < > 28* 27*  --  33* 32* 34* 36*  CREATININE 1.22   < > 0.92 0.89 0.91 0.97 0.90 0.92 0.80  CALCIUM 7.5*   < > 7.1* 7.2*  --  7.3* 7.6* 7.9* 7.7*  MG 2.3  --  1.8  --  1.8  --   --   --  1.7   < > = values in this interval not displayed.   Liver Function Tests: Recent Labs  Lab 08/07/21 1426 08/08/21 1354 08/09/21 0404 08/10/21 0322 08/11/21 0357  AST 35 34 '28 30 26  '$ ALT <5 <5 '5 6 6  '$ ALKPHOS 117 125 116 131* 132*  BILITOT 1.3* 1.0 1.0 0.7 0.5  PROT 6.2* 5.9* 6.0* 6.1* 5.6*  ALBUMIN 1.6* <1.5* <1.5* <1.5* <1.5*   No results for input(s): "LIPASE", "AMYLASE" in the last 168 hours. Recent Labs  Lab 08/05/21 0327 08/08/21 1530  AMMONIA 22 14   CBC: Recent Labs  Lab 08/07/21 0443 08/08/21 0339 08/09/21 0404 08/10/21 0322 08/11/21 0357  WBC 16.5* 18.1* 14.8* 13.6* 12.3*  NEUTROABS  --   --  11.6*  --  9.7*  HGB 8.2* 7.9* 7.4* 7.0* 6.6*  HCT 25.0* 24.7* 22.8* 21.7* 20.2*  MCV 96.5 98.4 98.3 98.6 99.0  PLT 386 485* 517* 496* 463*   Cardiac Enzymes: No results for input(s): "CKTOTAL", "CKMB", "CKMBINDEX", "TROPONINI" in the last 168 hours. BNP: Invalid input(s):  "POCBNP" CBG: Recent Labs  Lab 08/09/21 0048 08/09/21 1130 08/09/21 1659 08/10/21 0058 08/10/21 0636  GLUCAP 119* 146* 121* 116* 126*   D-Dimer No results for input(s): "DDIMER" in the last 72 hours. Hgb A1c No results for input(s): "HGBA1C"  in the last 72 hours. Lipid Profile No results for input(s): "CHOL", "HDL", "LDLCALC", "TRIG", "CHOLHDL", "LDLDIRECT" in the last 72 hours. Thyroid function studies No results for input(s): "TSH", "T4TOTAL", "T3FREE", "THYROIDAB" in the last 72 hours.  Invalid input(s): "FREET3" Anemia work up No results for input(s): "VITAMINB12", "FOLATE", "FERRITIN", "TIBC", "IRON", "RETICCTPCT" in the last 72 hours. Urinalysis    Component Value Date/Time   COLORURINE AMBER (A) 08/08/2021 1707   APPEARANCEUR HAZY (A) 08/08/2021 1707   LABSPEC 1.020 08/08/2021 1707   PHURINE 5.0 08/08/2021 1707   GLUCOSEU NEGATIVE 08/08/2021 1707   HGBUR SMALL (A) 08/08/2021 1707   BILIRUBINUR NEGATIVE 08/08/2021 1707   KETONESUR 5 (A) 08/08/2021 1707   PROTEINUR NEGATIVE 08/08/2021 1707   UROBILINOGEN 0.2 10/14/2009 1851   NITRITE NEGATIVE 08/08/2021 1707   LEUKOCYTESUR NEGATIVE 08/08/2021 1707   Sepsis Labs Recent Labs  Lab 08/08/21 0339 08/09/21 0404 08/10/21 0322 08/11/21 0357  WBC 18.1* 14.8* 13.6* 12.3*   Microbiology Recent Results (from the past 240 hour(s))  Aerobic/Anaerobic Culture w Gram Stain (surgical/deep wound)     Status: Abnormal   Collection Time: 08/02/21  2:25 PM   Specimen: Gallbladder; Bile  Result Value Ref Range Status   Specimen Description   Final    GALL BLADDER Performed at [facility], [address] [place], [place] [address]    Special Requests   Final    NONE Performed at [facility], [address] [place], [place] [address]    Gram Stain   Final    NO WBC SEEN FEW GRAM POSITIVE COCCI IN PAIRS FEW GRAM NEGATIVE RODS MODERATE BRANCHING, BEADING  GRAM POSITIVE RODS    Culture (A)  Final    MULTIPLE ORGANISMS  PRESENT, NONE PREDOMINANT NO GROUP A STREP (S.PYOGENES) ISOLATED NO STAPHYLOCOCCUS AUREUS ISOLATED NO ANAEROBES ISOLATED Performed at [facility], [place] [address] [place], [place] [address]    Report Status 08/07/2021 FINAL  Final  Culture, blood (Routine X 2) w Reflex to ID Panel     Status: Abnormal   Collection Time: 08/04/21  4:06 AM   Specimen: Right Antecubital; Blood  Result Value Ref Range Status   Specimen Description   Final    RIGHT ANTECUBITAL Performed at [facility], [address] [place], [place] [address]    Special Requests   Final    BOTTLES DRAWN AEROBIC AND ANAEROBIC Blood Culture adequate volume Performed at [facility], [address], [place], [place] [address]    Culture  Setup Time   Final    GRAM POSITIVE COCCI ANAEROBIC BOTTLE ONLY Gram Stain Report Called to,Read Back By and Verified With: MCGIBBONY @ 7517 ON [id number] BY HENDERSON L CRITICAL RESULT CALLED TO, READ BACK BY AND VERIFIED WITH: RN Ralph Dowdy [phone] '@1926'$  FH    Culture (A)  Final    STAPHYLOCOCCUS EPIDERMIDIS THE SIGNIFICANCE OF ISOLATING THIS ORGANISM FROM A SINGLE SET OF BLOOD CULTURES WHEN MULTIPLE SETS ARE DRAWN IS UNCERTAIN. PLEASE NOTIFY THE MICROBIOLOGY DEPARTMENT WITHIN ONE WEEK IF SPECIATION AND SENSITIVITIES ARE REQUIRED. Performed at [facility], [place] [address], [place], [place] [address]    Report Status 08/07/2021 FINAL  Final  Blood Culture ID Panel (Reflexed)     Status: Abnormal   Collection Time: 08/04/21  4:06 AM  Result Value Ref Range Status   Enterococcus faecalis NOT DETECTED NOT DETECTED Final   Enterococcus Faecium NOT DETECTED NOT DETECTED Final   Listeria monocytogenes NOT DETECTED NOT DETECTED  Final   Staphylococcus species DETECTED (A) NOT DETECTED Final    Comment: CRITICAL RESULT CALLED TO, READ BACK BY AND VERIFIED WITH: RN Ralph Dowdy [phone] '@1926'$  FH    Staphylococcus aureus (BCID) NOT DETECTED NOT DETECTED Final   Staphylococcus epidermidis  DETECTED (A) NOT DETECTED Final    Comment: Methicillin (oxacillin) resistant coagulase negative staphylococcus. Possible blood culture contaminant (unless isolated from more than one blood culture draw or clinical case suggests pathogenicity). No antibiotic treatment is indicated for blood  culture contaminants. CRITICAL RESULT CALLED TO, READ BACK BY AND VERIFIED WITH: RN Ralph Dowdy [phone] '@1926'$  FH    Staphylococcus lugdunensis NOT DETECTED NOT DETECTED Final   Streptococcus species NOT DETECTED NOT DETECTED Final   Streptococcus agalactiae NOT DETECTED NOT DETECTED Final   Streptococcus pneumoniae NOT DETECTED NOT DETECTED Final   Streptococcus pyogenes NOT DETECTED NOT DETECTED Final   A.calcoaceticus-baumannii NOT DETECTED NOT DETECTED Final   Bacteroides fragilis NOT DETECTED NOT DETECTED Final   Enterobacterales NOT DETECTED NOT DETECTED Final   Enterobacter cloacae complex NOT DETECTED NOT DETECTED Final   Escherichia coli NOT DETECTED NOT DETECTED Final   Klebsiella aerogenes NOT DETECTED NOT DETECTED Final   Klebsiella oxytoca NOT DETECTED NOT DETECTED Final   Klebsiella pneumoniae NOT DETECTED NOT DETECTED Final   Proteus species NOT DETECTED NOT DETECTED Final   Salmonella species NOT DETECTED NOT DETECTED Final   Serratia marcescens NOT DETECTED NOT DETECTED Final   Haemophilus influenzae NOT DETECTED NOT DETECTED Final   Neisseria meningitidis NOT DETECTED NOT DETECTED Final   Pseudomonas aeruginosa NOT DETECTED NOT DETECTED Final   Stenotrophomonas maltophilia NOT DETECTED NOT DETECTED Final   Candida albicans NOT DETECTED NOT DETECTED Final   Candida auris NOT DETECTED NOT DETECTED Final   Candida glabrata NOT DETECTED NOT DETECTED Final   Candida krusei NOT DETECTED NOT DETECTED Final   Candida parapsilosis NOT DETECTED NOT DETECTED Final   Candida tropicalis NOT DETECTED NOT DETECTED Final   Cryptococcus neoformans/gattii NOT DETECTED NOT DETECTED Final    Methicillin resistance mecA/C DETECTED (A) NOT DETECTED Final    Comment: CRITICAL RESULT CALLED TO, READ BACK BY AND VERIFIED WITH: RN Ralph Dowdy [phone] '@1926'$  FH Performed at [facility], [address] [place], [place] [address]   Culture, blood (Routine X 2) w Reflex to ID Panel     Status: None   Collection Time: 08/04/21  6:15 AM   Specimen: BLOOD LEFT HAND  Result Value Ref Range Status   Specimen Description BLOOD LEFT HAND  Final   Special Requests   Final    BOTTLES DRAWN AEROBIC AND ANAEROBIC Blood Culture adequate volume   Culture   Final    NO GROWTH 5 DAYS Performed at [facility], [address] [place], [place] [address]    Report Status 08/09/2021 FINAL  Final  Culture, blood (Routine X 2) w Reflex to ID Panel     Status: Abnormal   Collection Time: 08/08/21  3:49 PM   Specimen: BLOOD RIGHT HAND  Result Value Ref Range Status   Specimen Description   Final    BLOOD RIGHT HAND Performed at [facility], [address] [place], [place] [address]    Special Requests   Final    BOTTLES DRAWN AEROBIC AND ANAEROBIC Blood Culture results may not be optimal due to an inadequate volume of blood received in culture bottles Performed at [facility], [address] [place], [place] [address]  Culture  Setup Time   Final    ANAEROBIC BOTTLE ONLY GRAM POSITIVE COCCI Gram Stain Report Called to,Read Back By and Verified With: BRITTANY FOLEY @ 1731 ON 08/09/2021 C VARNER IN BOTH AEROBIC AND ANAEROBIC BOTTLES CRITICAL VALUE NOTED.  VALUE IS CONSISTENT WITH PREVIOUSLY REPORTED AND CALLED VALUE. GRAM STAIN REVIEWED-AGREE WITH RESULT Performed at [facility], [place] [address] [place], [place] [address]    Culture STAPHYLOCOCCUS EPIDERMIDIS (A)  Final   Report Status 08/11/2021 FINAL  Final   Organism ID, Bacteria STAPHYLOCOCCUS EPIDERMIDIS  Final      Susceptibility   Staphylococcus epidermidis - MIC*    CIPROFLOXACIN <=0.5 SENSITIVE Sensitive     ERYTHROMYCIN  <=0.25 SENSITIVE Sensitive     GENTAMICIN <=0.5 SENSITIVE Sensitive     OXACILLIN >=4 RESISTANT Resistant     TETRACYCLINE <=1 SENSITIVE Sensitive     VANCOMYCIN 1 SENSITIVE Sensitive     TRIMETH/SULFA <=10 SENSITIVE Sensitive     CLINDAMYCIN <=0.25 SENSITIVE Sensitive     RIFAMPIN <=0.5 SENSITIVE Sensitive     Inducible Clindamycin NEGATIVE Sensitive     * STAPHYLOCOCCUS EPIDERMIDIS  Culture, blood (Routine X 2) w Reflex to ID Panel     Status: None (Preliminary result)   Collection Time: 08/08/21  3:49 PM   Specimen: BLOOD  Result Value Ref Range Status   Specimen Description BLOOD PICC LINE  Final   Special Requests   Final    BOTTLES DRAWN AEROBIC AND ANAEROBIC Blood Culture adequate volume   Culture   Final    NO GROWTH 3 DAYS Performed at [facility], [address], [place], [place] [address]    Report Status PENDING  Incomplete  Urine Culture     Status: Abnormal   Collection Time: 08/08/21  5:07 PM   Specimen: Urine, Clean Catch  Result Value Ref Range Status   Specimen Description   Final    URINE, CLEAN CATCH Performed at [facility], [address], [place], [place] [address]    Special Requests   Final    NONE Performed at [facility], [address], [place], [place] [address]    Culture (A)  Final    <10,000 COLONIES/mL INSIGNIFICANT GROWTH Performed at [facility], [place] [address] [place], [place] [address]    Report Status 08/09/2021 FINAL  Final  SARS Coronavirus 2 by RT PCR (hospital order, performed in [facility] hospital lab) *cepheid single result test* Anterior Nasal Swab     Status: None   Collection Time: 08/11/21 11:02 AM   Specimen: Anterior Nasal Swab  Result Value Ref Range Status   SARS Coronavirus 2 by RT PCR NEGATIVE NEGATIVE Final    Comment: (NOTE) SARS-CoV-2 target nucleic acids are NOT DETECTED.  The SARS-CoV-2 RNA is generally detectable in upper and lower respiratory specimens during the acute phase of infection. The  lowest concentration of SARS-CoV-2 viral copies this assay can detect is 250 copies / mL. A negative result does not preclude SARS-CoV-2 infection and should not be used as the sole basis for treatment or other patient management decisions.  A negative result may occur with improper specimen collection / handling, submission of specimen other than nasopharyngeal swab, presence of viral mutation(s) within the areas targeted by this assay, and inadequate number of viral copies (<250 copies / mL). A negative result must be combined with clinical observations, patient history, and epidemiological information.  Fact Sheet for Patients:   https://www.patel.info/  Fact Sheet  for Healthcare Providers: https://hall.com/  This test is not yet approved or  cleared by the [place] and has been authorized for detection and/or diagnosis of SARS-CoV-2 by FDA under an Emergency Use Authorization (EUA).  This EUA will remain in effect (meaning this test can be used) for the duration of the COVID-19 declaration under Section 564(b)(1) of the Act, 21 U.S.C. section 360bbb-3(b)(1), unless the authorization is terminated or revoked sooner.  Performed at [facility], [address], [place], [place] [address]    Time coordinating discharge:  35 mins   SIGNED:  Irwin Brakeman, MD  [facility] 08/11/2021, 3:34 PM How to contact the [facility] Attending or Consulting provider [place] or covering provider during after hours [place], for this patient?  Check the care team in [facility] and look for a) attending/consulting TRH provider listed and b) the [facility] team listed Log into [email or web address] and use [place]'s universal password to access. If you do not have the password, please contact the hospital operator. Locate the [facility] provider you are looking for under [facility] and page to a number that you can be directly reached. If you still have difficulty  reaching the provider, please page the [facility] (Director on Call) for the Hospitalists listed on amion for assistance.

## 2021-08-11 NOTE — Progress Notes (Signed)
Report given to Hospice

## 2021-08-11 NOTE — Progress Notes (Signed)
Pt refused IV pain medication at this time.

## 2021-08-11 NOTE — Progress Notes (Signed)
Nsg Discharge Note  Admit Date:  07/28/2021 Discharge date: 08/11/2021   RYLEE NUZUM to be D/C'd Hospice  per MD order.  AVS completed.  Copy for chart, and copy for patient signed, and dated. Patient/caregiver able to verbalize understanding.  Discharge Medication: Allergies as of 08/11/2021       Reactions   Aspirin Other (See Comments)   Stomach ulcers.   Coconut (cocos Nucifera)    Other reaction(s): GI Upset (intolerance)   Penicillins Rash   Has patient had a PCN reaction causing immediate rash, facial/tongue/throat swelling, SOB or lightheadedness with hypotension: no Has patient had a PCN reaction causing severe rash involving mucus membranes or skin necrosis: No Has patient had a PCN reaction that required hospitalization No Has patient had a PCN reaction occurring within the last 10 years: No If all of the above answers are "NO", then may proceed with Cephalosporin use.        Medication List     STOP taking these medications    aspirin 81 MG tablet   Azelastine HCl 0.15 % Soln   cyclobenzaprine 10 MG tablet Commonly known as: FLEXERIL   escitalopram 10 MG tablet Commonly known as: LEXAPRO   furosemide 20 MG tablet Commonly known as: LASIX   LORazepam 1 MG tablet Commonly known as: ATIVAN   meloxicam 15 MG tablet Commonly known as: MOBIC   metFORMIN 500 MG tablet Commonly known as: GLUCOPHAGE   metoprolol tartrate 25 MG tablet Commonly known as: LOPRESSOR   OLANZapine 10 MG tablet Commonly known as: ZyPREXA   pantoprazole 40 MG tablet Commonly known as: PROTONIX   potassium chloride SA 20 MEQ tablet Commonly known as: KLOR-CON M   pramipexole 0.75 MG tablet Commonly known as: MIRAPEX   QUEtiapine 25 MG tablet Commonly known as: SEROQUEL   selegiline 5 MG tablet Commonly known as: ELDEPRYL   simvastatin 40 MG tablet Commonly known as: ZOCOR   valsartan-hydrochlorothiazide 80-12.5 MG tablet Commonly known as: DIOVAN-HCT        TAKE these medications    Carbidopa-Levodopa ER 25-100 MG tablet controlled release Commonly known as: SINEMET CR Take 1-2 tablets by mouth in the morning, at noon, in the evening, and at bedtime. 2 tabs in the am, 2 tabs at lunch, 1 tab at dinner, AND 2 tabs at bedtime        Discharge Assessment: Vitals:   08/11/21 1125 08/11/21 1130  BP: (!) 132/46   Pulse: 90 88  Resp: (!) 35 (!) 38  Temp: 99.3 F (37.4 C)   SpO2: 98% 97%   Skin clean, dry and intact without evidence of skin break down, no evidence of skin tears noted. IV catheter discontinued intact. Site without signs and symptoms of complications - no redness or edema noted at insertion site, patient denies c/o pain - only slight tenderness at site.  Dressing with slight pressure applied.  D/c Instructions-Education: Discharge instructions given to patient/family with verbalized understanding. D/c education completed with patient/family including follow up instructions, medication list, d/c activities limitations if indicated, with other d/c instructions as indicated by MD - patient able to verbalize understanding, all questions fully answered. Patient instructed to return to ED, call 911, or call MD for any changes in condition.  Patient escorted via [place], and D/C home via private auto.  Clovis Fredrickson, LPN [phone]/[id number]/[id number] 5:46 PM

## 2021-08-11 NOTE — Progress Notes (Signed)
Nursing Progress Note   Patient PiCC line removed per protocol. Pressure was held, patient's gauze and tegaderm covering dry/intact.  Vitals:   08/11/21 1125 08/11/21 1130  BP: (!) 132/46   Pulse: 90 88  Resp: (!) 35 (!) 38  Temp: 99.3 F (37.4 C)   SpO2: 98% 97%    Alfonse Alpers RN, BSN 08/11/2021 6:35 PM

## 2021-08-11 NOTE — TOC Progression Note (Signed)
Transition of Care [facility]) - Progression Note    Patient Details  Name: Elijah [name] MRN: [id number] Date of Birth: 07-31-1940  Transition of Care [facility]) CM/SW Contact  Ihor Gully, LCSW Phone Number: 08/11/2021, 10:58 AM  Clinical Narrative:    Per attending request, referral made to [facility].    Expected Discharge Plan: [place] Barriers to Discharge: Continued Medical Work up  Expected Discharge Plan and Services Expected Discharge Plan: [place]                                               Social Determinants of Health (SDOH) Interventions    Readmission Risk Interventions     No data to display

## 2021-08-11 NOTE — Progress Notes (Signed)
08/11/2021 10:23 AM  I had a long meeting with patient's wife today at bedside and we discussed patient's continued decline despite maximal medical interventions.  I expressed my opinion that patient is not going to be able to go to a SNF for rehab and that patient is not improving in a meaningful way.  Wife shared her observations that patient is not getting better to be able come back home which was her hope.  We talked about patient's frequent grunting from his chronic back pain and his delirium and confusion.  Wife expressed that she would like to focus on compassionate care and comfort care at this time.  She understands that she cannot care for him at home due to her back problems and requested that patient go to [facility] which is where other family members have been cared for in the past.  She lives very close to [facility] and is not able to drive to [place].  We will complete the blood transfusion but after that no further blood draws or labs or tests and we will focus his care on comfort and dignity.  I have reached out to the social worker to place a referral for [facility] per wife request.  I am updating orders to reflect full comfort care.   Will start with oral pain meds but if not able to get pain under control would transition to IV infusion.    Murvin Natal, MD  How to contact the [facility] Attending or Consulting provider [place] or covering provider during after hours [place], for this patient?  Check the care team in [facility] and look for a) attending/consulting TRH provider listed and b) the [facility] team listed Log into [email or web address] and use [place]'s universal password to access. If you do not have the password, please contact the hospital operator. Locate the [facility] provider you are looking for under [facility] and page to a number that you can be directly reached. If you still have difficulty reaching the provider, please page the [facility] (Director on Call) for the Hospitalists listed  on amion for assistance.

## 2021-08-11 NOTE — TOC Transition Note (Signed)
Transition of Care [facility]) - CM/SW Discharge Note   Patient Details  Name: Elijah [name] MRN: [id number] Date of Birth: 05/13/1940  [facility] CM/SW Contact:  Ihor Gully, LCSW Phone Number: 08/11/2021, 3:59 PM   Clinical Narrative:    Discharge clinicals sent to facility. Hospice arranged transportation. RN to call report. TOC signing off.      Barriers to Discharge: Continued Medical Work up, No Barriers Identified   Patient Goals and CMS Choice        Discharge Placement              Patient chooses bed at: Other - please specify in the comment section below: [facility]) Patient to be transferred to facility by: [facility] Name of family member notified: Mrs. Word Patient and family notified of of transfer: 08/11/21  Discharge Plan and Services                                     Social Determinants of Health (SDOH) Interventions     Readmission Risk Interventions     No data to display

## 2021-08-12 LAB — BPAM RBC
Blood Product Expiration Date: 202307122359
ISSUE DATE / TIME: 202306220832
Unit Type and Rh: 1700

## 2021-08-12 LAB — TYPE AND SCREEN
ABO/RH(D): B POS
Antibody Screen: NEGATIVE
Unit division: 0

## 2021-08-13 LAB — CULTURE, BLOOD (ROUTINE X 2)
Culture: NO GROWTH
Special Requests: ADEQUATE

## 2021-09-02 ENCOUNTER — Telehealth: Payer: Self-pay | Admitting: Adult Health

## 2021-09-02 ENCOUNTER — Telehealth: Payer: Self-pay | Admitting: Behavioral Health

## 2021-09-02 NOTE — Telephone Encounter (Signed)
Ok, thank you for letting me know.

## 2021-09-02 NOTE — Telephone Encounter (Signed)
Wife called to report pt passed on 09-06-22

## 2021-09-02 NOTE — Telephone Encounter (Signed)
Elijah [name] wife called to canc apt 11/24/21. Pt passed 08/30/21 Sunday.  Elijah [name] contact # [phone]

## 2021-09-05 NOTE — Telephone Encounter (Signed)
Lets send a card 

## 2021-09-05 NOTE — Telephone Encounter (Signed)
Card initiated (to be signed and mailed.

## 2021-09-20 DEATH — deceased

## 2021-09-23 ENCOUNTER — Other Ambulatory Visit: Payer: Self-pay | Admitting: Adult Health

## 2021-11-03 ENCOUNTER — Ambulatory Visit: Payer: PPO | Admitting: Neurology

## 2021-11-24 ENCOUNTER — Telehealth: Payer: PPO | Admitting: Behavioral Health
